# Patient Record
Sex: Male | Born: 1950
Health system: Southern US, Community
[De-identification: ages and names within clinical notes are randomized; demographics above are authoritative.]

## PROBLEM LIST (undated history)

## (undated) DIAGNOSIS — D649 Anemia, unspecified: Secondary | ICD-10-CM

## (undated) DIAGNOSIS — K219 Gastro-esophageal reflux disease without esophagitis: Secondary | ICD-10-CM

## (undated) HISTORY — DX: Anemia, unspecified: D64.9

## (undated) HISTORY — PX: STOMACH SURGERY: SHX791

## (undated) HISTORY — DX: Gastro-esophageal reflux disease without esophagitis: K21.9

## (undated) HISTORY — PX: LUMBAR FUSION: SHX111

---

## 1997-09-22 ENCOUNTER — Ambulatory Visit (HOSPITAL_COMMUNITY): Admission: RE | Admit: 1997-09-22 | Discharge: 1997-09-22 | Payer: Self-pay | Admitting: Neurosurgery

## 1998-10-29 ENCOUNTER — Emergency Department (HOSPITAL_COMMUNITY): Admission: EM | Admit: 1998-10-29 | Discharge: 1998-10-30 | Payer: Self-pay

## 1999-03-03 ENCOUNTER — Emergency Department (HOSPITAL_COMMUNITY): Admission: EM | Admit: 1999-03-03 | Discharge: 1999-03-04 | Payer: Self-pay | Admitting: *Deleted

## 2000-03-01 ENCOUNTER — Encounter: Payer: Self-pay | Admitting: Internal Medicine

## 2000-03-01 ENCOUNTER — Ambulatory Visit (HOSPITAL_COMMUNITY): Admission: RE | Admit: 2000-03-01 | Discharge: 2000-03-01 | Payer: Self-pay | Admitting: Internal Medicine

## 2002-03-27 ENCOUNTER — Emergency Department (HOSPITAL_COMMUNITY): Admission: EM | Admit: 2002-03-27 | Discharge: 2002-03-27 | Payer: Self-pay | Admitting: *Deleted

## 2002-03-27 ENCOUNTER — Encounter: Payer: Self-pay | Admitting: *Deleted

## 2004-07-31 ENCOUNTER — Emergency Department (HOSPITAL_COMMUNITY): Admission: EM | Admit: 2004-07-31 | Discharge: 2004-07-31 | Payer: Self-pay | Admitting: Emergency Medicine

## 2005-03-12 ENCOUNTER — Ambulatory Visit (HOSPITAL_COMMUNITY): Admission: RE | Admit: 2005-03-12 | Discharge: 2005-03-12 | Payer: Self-pay | Admitting: Internal Medicine

## 2005-06-17 ENCOUNTER — Ambulatory Visit: Payer: Self-pay | Admitting: Internal Medicine

## 2005-06-17 ENCOUNTER — Inpatient Hospital Stay (HOSPITAL_COMMUNITY): Admission: EM | Admit: 2005-06-17 | Discharge: 2005-06-19 | Payer: Self-pay | Admitting: Emergency Medicine

## 2005-06-18 ENCOUNTER — Encounter (INDEPENDENT_AMBULATORY_CARE_PROVIDER_SITE_OTHER): Payer: Self-pay | Admitting: Specialist

## 2008-10-02 ENCOUNTER — Emergency Department (HOSPITAL_COMMUNITY): Admission: EM | Admit: 2008-10-02 | Discharge: 2008-10-02 | Payer: Self-pay | Admitting: Emergency Medicine

## 2010-03-03 ENCOUNTER — Emergency Department (HOSPITAL_COMMUNITY)
Admission: EM | Admit: 2010-03-03 | Discharge: 2010-03-04 | Payer: Self-pay | Source: Home / Self Care | Admitting: Emergency Medicine

## 2010-06-15 LAB — POCT I-STAT, CHEM 8
BUN: 28 mg/dL — ABNORMAL HIGH (ref 6–23)
Calcium, Ion: 1.06 mmol/L — ABNORMAL LOW (ref 1.12–1.32)
Chloride: 111 mEq/L (ref 96–112)
Creatinine, Ser: 1.1 mg/dL (ref 0.4–1.5)
Glucose, Bld: 56 mg/dL — ABNORMAL LOW (ref 70–99)
HCT: 43 % (ref 39.0–52.0)
Hemoglobin: 14.6 g/dL (ref 13.0–17.0)
Potassium: 4.1 mEq/L (ref 3.5–5.1)
Sodium: 139 mEq/L (ref 135–145)
TCO2: 20 mmol/L (ref 0–100)

## 2010-08-20 NOTE — H&P (Signed)
NAMEJHAYDEN, Jorge Evans               ACCOUNT NO.:  [id number]   MEDICAL RECORD NO.:  [id number]          PATIENT TYPE:  INP   LOCATION:  4735                         FACILITY:  [facility]   PHYSICIAN:  Isidor Holts, M.D.  DATE OF BIRTH:  [birth year]   DATE OF ADMISSION:  06/17/2005  DATE OF DISCHARGE:                                HISTORY & PHYSICAL   PRIMARY CARE PHYSICIAN:  Lenon Curt. Chilton Si, M.D.   CHIEF COMPLAINT:  Passed out.   HISTORY OF PRESENT ILLNESS:  This is a 60 year old male.  For Past Medical  History, see below.  According to patient, he felt quite well until he went  to bed on June 16, 2005.  He woke up at 1:25 a.m. on June 17, 2005, felt  nauseated, took some Carafate and went back to sleep.  Woke up again at 3:00  a.m., went to the bathroom and moved his bowels, and the stools at that  time, seemed normal.  He took more Carafate for nausea, went back to bed.  Got up at 7:00 a.m., June 17, 2005, and went off to a job interview. While  there, sitting down, he started feeling warm, nauseated, tingly, and his  head started swimming.  He felt weak, got up, went out the door, and that  is all he remembers, until he awoke and found himself on the floor  surrounded by people.  EMS was called.  He had no acute injuries, no fecal  or urinary incontinence.   PAST MEDICAL HISTORY:  1.  Bleeding gastric ulcer August and September 1979, status post partial      gastrectomy.  2.  Scoliosis.  3.  Status post back surgery with fusion, for disk prolapse and      radiculopathy in 1999 (neurosurgeon Dr. Aliene Beams)   MEDICATIONS:  1.  Carafate 1 g p.o. 3 times a day.  2.  Skelaxin 800 mg p.o. twice daily  3.  NSAID 1 p.o. daily, for approximately 2 months, now. (The patient does      not know the name or the dose.)   ALLERGIES:  DEMEROL. This causes a rash.   REVIEW OF SYSTEMS:  Over the past few weeks, the patient has noticed  intermittent shortness of breath on exertion.  Denies chest pain.  Has also  recently been getting painful cramps in the calf.  No recent antecedent  illness, no vomiting, no black stools.   SOCIAL HISTORY:  The patient is married.  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  He has one daughter.  Smokes an occasional  cigar, does not drink any alcohol.  Denies drug abuse.   FAMILY HISTORY:  He has one brother who has coronary artery disease status  post CABG.  His mother is 68 years old with coronary artery disease and  hypertension.  His father died at age 9 years with emphysema.   PHYSICAL EXAMINATION:  VITAL SIGNS: Temperature 98.8, pulse 66 per minute  and regular, respiratory rate 18, blood pressure 80/41.  After bolus of  normal saline in  the emergency department was 111/49.  Pulse oximetry 100%  on 2 liters of oxygen.  GENERAL: The patient does not appear to be in obvious acute distress.  He is  alert, communicative, not short of breath at rest.  HEENT: The patient has marked clinical pallor, no jaundice.  Throat is quite  clear.  NECK:  Supple.  JVP not seen.  No palpable lymphadenopathy, no palpable  goiter.  CHEST: Clinically clear to auscultation, no wheezes or crackles.  CARDIAC:  Heart sounds 1 and 2 heard, normal, regular, no murmurs.  ABDOMEN:  Soft and nontender.  There is no palpable organomegaly, no  palpable masses.  Normal bowel sounds.  EXTREMITIES:  Lower extremity examination: No pitting edema, palpable  peripheral pulses.  MUSCULOSKELETAL: Examination was quite  on gross examination.   INVESTIGATIONS:  CBC: WBC 4, hemoglobin 4.8, hematocrit 16.7, MCV 54.5,  platelets 230.  Electrolytes: Sodium 137, potassium 4.3, chloride 114, CO2  21, BUN 13, creatinine 0.9, glucose 104. AST 20, ALT 20, alkaline  phosphatase 76.  INR 1.1.   ASSESSMENT AND PLAN:  1.  Profound microcytic anemia. Likely secondary to chronic gastrointestinal      blood loss.  We shall do iron studies, B12, folate,  stool guaiac's, and      transfuse 3 units packed red blood cells initially.  The patient's      anemia may, of course, be simple chronic iron-deficiency anemia,      secondary to gastrectomy. However, the patient has a history of      nonsteroidal anti-inflammatory drug (NSAID) treatment; i.e., for the      past 2 months.  We will need to rule out NSAID-induced peptic ulcer      disease, versus erosions. We shall commence patient on proton pump      inhibitor twice daily and request gastrointestinal consultation for      appropriate workup.   1.  Syncopal episode.  This is likely secondary to #1 above.  We shall,      however, do cardiac enzymes for completeness, EKG, and telemetric      monitoring.   1.  History of back pain.  The patient was on Skelaxin.  We shall hold this      for now, as it may cause/ exacerbate dizziness, and utilize p.r.n.      simple analgesics.   Further management will depend on clinical course.      Isidor Holts, M.D.  Electronically Signed     CO/MEDQ  D:  06/17/2005  T:  06/19/2005  Job:  [id number]   cc:   Lenon Curt. Chilton Si, M.D.  Fax: [phone]   Reinaldo Meeker, M.D.  Fax: [phone]

## 2010-08-20 NOTE — Discharge Summary (Signed)
Jorge Evans, Jorge Evans               ACCOUNT NO.:  [id number]   MEDICAL RECORD NO.:  [id number]          PATIENT TYPE:  INP   LOCATION:  4735                         FACILITY:  [facility]   PHYSICIAN:  Hettie Holstein, D.O.    DATE OF BIRTH:  01/29/1951   DATE OF ADMISSION:  06/17/2005  DATE OF DISCHARGE:  06/19/2005                                 DISCHARGE SUMMARY   PRIMARY CARE PHYSICIAN:  Dr. Murray Hodgkins   GASTROENTEROLOGIST:  Dr. Lina Sar   NEUROSURGEON:  Dr. Gerlene Fee   REASON FOR ADMISSION:  Profound anemia.   PRINCIPAL DIAGNOSES ON DISCHARGE:  1.  Iron deficiency anemia felt to be secondary to malabsorption. Jorge Evans      is status post a Billroth II surgery. His MCV was 65, his ferritin was      1, and his iron percent saturation was at 21. He underwent upper and      lower endoscopies per Dr. Juanda Chance without evidence of bleed or source of      anemia and is currently undergoing IV infusion of iron prior to      discharge.  2.  Anemia with presenting hemoglobin of 4.8 and an MCV as described above      at 65 status post 3 units of packed red blood cell transfusion with      stabilization of hemoglobin at 7.8 prior to discharge. He denied any      blood in his stools and, as noted above, he has undergone upper and      lower endoscopies without evidence of bleed.   PAST MEDICAL HISTORY:  As noted above, he has a previous history of partial  gastrectomy secondary to GI bleed from ulcers in 1979. He has a spinal  degenerative disease with severe back pain and use of nonsteroidal  antiinflammatories per Dr. Gerlene Fee. History of scoliosis, history of disc  fusions.   MEDICATIONS ON DISCHARGE:  He is being discharged on:  1.  Iron supplementation, ferrous gluconate 325 mg b.i.d.  2.  Vitamin C 500 mg daily.  3.  Prilosec over-the-counter 20 mg daily.  4.  He is instructed to use his nonsteroidal antiinflammatory sparingly.  5.  I am also writing a prescription for Ultram to take  to help manage his      back pain.   HISTORY OF PRESENTING ILLNESS:  For full details please refer to the history  and physical as dictated by Dr. Isidor Holts. However, briefly, Jorge Evans  is a pleasant 60 year old male with a history as described above status post  partial gastrectomy for ulcers who had presented with lightheadedness. He  had on day of presentation complained of nausea in the early morning. There  were no reports of blood in his stools. He was headed off for a job  interview, started feeling unwell, and subsequently the ambulance was  called. He presented to the emergency department and was discovered to have  a hemoglobin of 4.8. He was admitted for transfusion and GI evaluation.   HOSPITAL COURSE:  The patient was seen by [facility]  and was  admitted to a telemetry floor, underwent 3 units of prbc's transfusion.  There was no active bleeding during his hospital course. His hemoglobin  remained at 7.8 once transfused. His iron studies were indicative of iron  deficiency anemia and it was felt that this was related to malabsorption. He  is getting an infusion of iron and will be supplemented in the outpatient  setting. Currently recommending follow-up with his primary care physician  within the next 2 weeks for follow-up CBC. His hemoglobin at time of  discharge was 7.8, MCV was 65, ferritin of 1. He underwent upper and lower  endoscopy by Dr. Lina Sar, biopsies were taken and there are not results  at this time. He does have gastric remnant with normal mucosa and open  anastomosis status post a Billroth II procedure. He is medically stable for  discharge once he completes his iron infusion.   I did speak with Dr. Lina Sar as Dr. Gerlene Fee had stopped by and queried  whether or not he can continue his antiinflammatory agents. I spoke with Dr.  Juanda Chance. There was no evidence of gastritis, his gastric mucosa was normal,  and she recommended use of  nonsteroidals, though sparingly, this would be  okay, with proton pump inhibitor over-the-counter suggested.      Hettie Holstein, D.O.  Electronically Signed     ESS/MEDQ  D:  06/19/2005  T:  06/20/2005  Job:  [id number]   cc:   Lenon Curt. Chilton Si, M.D.  Fax: [phone]   Lina Sar, M.D. [facility]  [address]  [place]  [place] [address]   Reinaldo Meeker, M.D.  Fax: [phone]

## 2011-12-11 ENCOUNTER — Encounter (HOSPITAL_COMMUNITY): Payer: Self-pay | Admitting: *Deleted

## 2011-12-11 ENCOUNTER — Emergency Department (HOSPITAL_COMMUNITY)
Admission: EM | Admit: 2011-12-11 | Discharge: 2011-12-11 | Disposition: A | Discharge status: Home / Self Care | Payer: BC Managed Care – PPO | Attending: Emergency Medicine | Admitting: Emergency Medicine

## 2011-12-11 DIAGNOSIS — R52 Pain, unspecified: Secondary | ICD-10-CM | POA: Insufficient documentation

## 2011-12-11 DIAGNOSIS — R509 Fever, unspecified: Secondary | ICD-10-CM | POA: Insufficient documentation

## 2011-12-11 DIAGNOSIS — R110 Nausea: Secondary | ICD-10-CM | POA: Insufficient documentation

## 2011-12-11 DIAGNOSIS — R51 Headache: Secondary | ICD-10-CM | POA: Insufficient documentation

## 2011-12-11 DIAGNOSIS — D696 Thrombocytopenia, unspecified: Secondary | ICD-10-CM | POA: Insufficient documentation

## 2011-12-11 DIAGNOSIS — Z79899 Other long term (current) drug therapy: Secondary | ICD-10-CM | POA: Insufficient documentation

## 2011-12-11 LAB — CBC WITH DIFFERENTIAL/PLATELET
Basophils Absolute: 0 10*3/uL (ref 0.0–0.1)
Basophils Relative: 0 % (ref 0–1)
Eosinophils Absolute: 0 10*3/uL (ref 0.0–0.7)
Eosinophils Relative: 0 % (ref 0–5)
HCT: 44.9 % (ref 39.0–52.0)
Hemoglobin: 15.3 g/dL (ref 13.0–17.0)
Lymphocytes Relative: 7 % — ABNORMAL LOW (ref 12–46)
Lymphs Abs: 0.3 10*3/uL — ABNORMAL LOW (ref 0.7–4.0)
MCH: 28.2 pg (ref 26.0–34.0)
MCHC: 34.1 g/dL (ref 30.0–36.0)
MCV: 82.8 fL (ref 78.0–100.0)
Monocytes Absolute: 0.3 10*3/uL (ref 0.1–1.0)
Monocytes Relative: 7 % (ref 3–12)
Neutro Abs: 4.5 10*3/uL (ref 1.7–7.7)
Neutrophils Relative %: 87 % — ABNORMAL HIGH (ref 43–77)
Platelets: 146 10*3/uL — ABNORMAL LOW (ref 150–400)
RBC: 5.42 MIL/uL (ref 4.22–5.81)
RDW: 14.1 % (ref 11.5–15.5)
WBC: 5.2 10*3/uL (ref 4.0–10.5)

## 2011-12-11 LAB — URINALYSIS, ROUTINE W REFLEX MICROSCOPIC
Glucose, UA: NEGATIVE mg/dL
Ketones, ur: 15 mg/dL — AB
Leukocytes, UA: NEGATIVE
pH: 6.5 (ref 5.0–8.0)

## 2011-12-11 LAB — HEPATIC FUNCTION PANEL
Albumin: 3.8 g/dL (ref 3.5–5.2)
Alkaline Phosphatase: 93 U/L (ref 39–117)
Total Protein: 7 g/dL (ref 6.0–8.3)

## 2011-12-11 LAB — POCT I-STAT, CHEM 8
BUN: 9 mg/dL (ref 6–23)
Sodium: 137 mEq/L (ref 135–145)
TCO2: 21 mmol/L (ref 0–100)

## 2011-12-11 LAB — URINE MICROSCOPIC-ADD ON

## 2011-12-11 MED ORDER — ONDANSETRON 4 MG PO TBDP: 4.0000 mg | ORAL_TABLET | Freq: Three times a day (TID) | ORAL | Status: AC | PRN

## 2011-12-11 MED ORDER — DOXYCYCLINE HYCLATE 100 MG PO CAPS: 100.0000 mg | ORAL_CAPSULE | Freq: Two times a day (BID) | ORAL | Status: AC

## 2011-12-11 MED ORDER — ONDANSETRON HCL 4 MG/2ML IJ SOLN
4.0000 mg | Freq: Once | INTRAMUSCULAR | Status: AC
  Administered 2011-12-11: 4 mg via INTRAVENOUS
  Filled 2011-12-11: qty 2

## 2011-12-11 MED ORDER — DOXYCYCLINE HYCLATE 100 MG IV SOLR
100.0000 mg | Freq: Once | INTRAVENOUS | Status: AC
  Administered 2011-12-11: 100 mg via INTRAVENOUS
  Filled 2011-12-11: qty 100

## 2011-12-11 NOTE — ED Notes (Signed)
Patient is awaiting ABT infusion prior to discharge. No acute distress.

## 2011-12-11 NOTE — ED Notes (Addendum)
General sickness, n/v, h/a, fever. Hx. Of gi ulcers. Did take phenergan which help but still has fever. N/v/ h/a since 2230 last night. When standing becomes nauseated. Feel better when laying down.

## 2011-12-11 NOTE — ED Notes (Signed)
Vital signs stable. 

## 2011-12-11 NOTE — ED Notes (Signed)
Patient was alert and oriented x4.  Patient was explained discharge instructions by Meredith Staggers, RN and no questions were asked.  Patient was taken home by spouse.

## 2011-12-11 NOTE — ED Provider Notes (Signed)
History     CSN: [id number]  Arrival date & time 12/11/11  1604   First MD Initiated Contact with Patient 12/11/11 1727      Chief Complaint  Patient presents with  . Generalized Body Aches  . Nausea  . Headache     Patient is a 61 y.o. male presenting with headaches.  Headache   General sickness, n/v, h/a, fever. Hx. Of gi ulcers. Did take phenergan which help but still has fever. N/v/ h/a since 2230 last night. When standing becomes nauseated. Feel better when laying down.   History reviewed. No pertinent past medical history.  Past Surgical History  Procedure Date  . Stomach surgery   . Lumbar fusion     No family history on file.  History  Substance Use Topics  . Smoking status: Former Games developer  . Smokeless tobacco: Not on file  . Alcohol Use: No      Review of Systems  Neurological: Positive for headaches.  All other systems reviewed and are negative.    Allergies  Demerol  Home Medications   Current Outpatient Rx  Name Route Sig Dispense Refill  . DIPHENHYDRAMINE-APAP (SLEEP) 25-500 MG PO TABS Oral Take 1 tablet by mouth at bedtime as needed. For sleep/leg pain.    . SUCRALFATE 1 G PO TABS Oral Take 1 g by mouth 3 (three) times daily.    Marland Kitchen DOXYCYCLINE HYCLATE 100 MG PO CAPS Oral Take 1 capsule (100 mg total) by mouth 2 (two) times daily. 20 capsule 0  . ONDANSETRON 4 MG PO TBDP Oral Take 1 tablet (4 mg total) by mouth every 8 (eight) hours as needed for nausea. 20 tablet 0    BP 116/71  Pulse 74  Temp 97.7 F (36.5 C) (Oral)  Resp 17  SpO2 99%  Physical Exam  Nursing note and vitals reviewed. Constitutional: He is oriented to person, place, and time. He appears well-developed. No distress.  HENT:  Head: Normocephalic and atraumatic.  Eyes: Pupils are equal, round, and reactive to light.  Neck: Normal range of motion. Neck supple. No Brudzinski's sign and no Kernig's sign noted.  Cardiovascular: Normal rate and intact distal pulses.    Date: 12/11/2011  Rate: 73  Rhythm: normal sinus rhythm  QRS Axis: normal  Intervals: normal  ST/T Wave abnormalities: normal  Conduction Disutrbances: none  Narrative Interpretation: unremarkable      Pulmonary/Chest: No respiratory distress.  Abdominal: Normal appearance. He exhibits no distension. There is no tenderness. There is no rebound and no guarding.  Musculoskeletal: Normal range of motion.  Neurological: He is alert and oriented to person, place, and time. No cranial nerve deficit.  Skin: Skin is warm and dry. No rash noted.  Psychiatric: He has a normal mood and affect. His behavior is normal.    ED Course  Procedures (including critical care time) Scheduled Meds:    . doxycycline (VIBRAMYCIN) IV  100 mg Intravenous Once  . ondansetron  4 mg Intravenous Once   Continuous Infusions:  PRN Meds:. Scheduled Meds:   . doxycycline (VIBRAMYCIN) IV  100 mg Intravenous Once  . ondansetron  4 mg Intravenous Once   Continuous Infusions:  PRN Meds:.  Labs Reviewed  CBC WITH DIFFERENTIAL - Abnormal; Notable for the following:    Platelets 146 (*)     Neutrophils Relative 87 (*)     Lymphocytes Relative 7 (*)     Lymphs Abs 0.3 (*)     All other components within normal  limits  POCT I-STAT, CHEM 8 - Abnormal; Notable for the following:    Glucose, Bld 117 (*)     All other components within normal limits  URINALYSIS, ROUTINE W REFLEX MICROSCOPIC - Abnormal; Notable for the following:    Ketones, ur 15 (*)     Protein, ur 30 (*)     All other components within normal limits  HEPATIC FUNCTION PANEL  URINE MICROSCOPIC-ADD ON   No results found.   1. Fever   2. Thrombocytopenia       MDM   After treatment in the ED the patient feels back to baseline and wants to go home.   Patient is concerning for possible rickettsial illness in light of fever, headache, thrombocytopenia.  No known tick bite.  Will treat empirically.        Nelia Shi,  MD 12/11/11 [phone]

## 2011-12-11 NOTE — ED Notes (Signed)
Patient continues to receive IV abt infusion. No acute distress noted at present.

## 2014-04-16 ENCOUNTER — Other Ambulatory Visit: Payer: Self-pay | Admitting: Orthopaedic Surgery

## 2014-04-16 DIAGNOSIS — M40209 Unspecified kyphosis, site unspecified: Secondary | ICD-10-CM

## 2014-04-16 DIAGNOSIS — M419 Scoliosis, unspecified: Secondary | ICD-10-CM

## 2014-04-22 ENCOUNTER — Ambulatory Visit
Admission: RE | Admit: 2014-04-22 | Discharge: 2014-04-22 | Disposition: A | Discharge status: Home / Self Care | Payer: BLUE CROSS/BLUE SHIELD | Source: Ambulatory Visit | Attending: Orthopaedic Surgery | Admitting: Orthopaedic Surgery

## 2014-04-22 DIAGNOSIS — M419 Scoliosis, unspecified: Secondary | ICD-10-CM

## 2014-04-22 DIAGNOSIS — M40209 Unspecified kyphosis, site unspecified: Secondary | ICD-10-CM

## 2014-07-22 ENCOUNTER — Ambulatory Visit: Payer: BLUE CROSS/BLUE SHIELD | Admitting: Internal Medicine

## 2014-12-01 ENCOUNTER — Encounter: Payer: Self-pay | Admitting: Internal Medicine

## 2014-12-01 ENCOUNTER — Other Ambulatory Visit (INDEPENDENT_AMBULATORY_CARE_PROVIDER_SITE_OTHER): Payer: Medicare Other

## 2014-12-01 ENCOUNTER — Ambulatory Visit (INDEPENDENT_AMBULATORY_CARE_PROVIDER_SITE_OTHER): Payer: Medicare Other | Admitting: Internal Medicine

## 2014-12-01 VITALS — BP 118/70 | HR 88 | Temp 98.4°F | Resp 18 | Wt 155.1 lb

## 2014-12-01 DIAGNOSIS — R7301 Impaired fasting glucose: Secondary | ICD-10-CM

## 2014-12-01 DIAGNOSIS — D649 Anemia, unspecified: Secondary | ICD-10-CM

## 2014-12-01 DIAGNOSIS — M4120 Other idiopathic scoliosis, site unspecified: Secondary | ICD-10-CM | POA: Diagnosis not present

## 2014-12-01 DIAGNOSIS — E789 Disorder of lipoprotein metabolism, unspecified: Secondary | ICD-10-CM

## 2014-12-01 DIAGNOSIS — Z0000 Encounter for general adult medical examination without abnormal findings: Secondary | ICD-10-CM

## 2014-12-01 DIAGNOSIS — K219 Gastro-esophageal reflux disease without esophagitis: Secondary | ICD-10-CM | POA: Diagnosis not present

## 2014-12-01 LAB — CBC
HCT: 31.8 % — ABNORMAL LOW (ref 39.0–52.0)
HEMOGLOBIN: 9.9 g/dL — AB (ref 13.0–17.0)
MCHC: 31.1 g/dL (ref 30.0–36.0)
MCV: 62.9 fl — ABNORMAL LOW (ref 78.0–100.0)
PLATELETS: 204 10*3/uL (ref 150.0–400.0)
RBC: 5.06 Mil/uL (ref 4.22–5.81)
RDW: 19.5 % — AB (ref 11.5–15.5)
WBC: 6.2 10*3/uL (ref 4.0–10.5)

## 2014-12-01 LAB — HEMOGLOBIN A1C: HEMOGLOBIN A1C: 5.7 % (ref 4.6–6.5)

## 2014-12-01 LAB — COMPREHENSIVE METABOLIC PANEL
ALBUMIN: 4.2 g/dL (ref 3.5–5.2)
ALT: 13 U/L (ref 0–53)
AST: 14 U/L (ref 0–37)
Alkaline Phosphatase: 63 U/L (ref 39–117)
BUN: 14 mg/dL (ref 6–23)
CALCIUM: 9.1 mg/dL (ref 8.4–10.5)
CHLORIDE: 108 meq/L (ref 96–112)
CO2: 26 mEq/L (ref 19–32)
Creatinine, Ser: 0.95 mg/dL (ref 0.40–1.50)
GFR: 84.54 mL/min (ref >60.00)
Glucose, Bld: 95 mg/dL (ref 70–99)
POTASSIUM: 4.2 meq/L (ref 3.5–5.1)
Sodium: 140 mEq/L (ref 135–145)
Total Bilirubin: 0.4 mg/dL (ref 0.2–1.2)
Total Protein: 6.6 g/dL (ref 6.0–8.3)

## 2014-12-01 LAB — LIPID PANEL
CHOL/HDL RATIO: 4
Cholesterol: 140 mg/dL (ref 0–200)
HDL: 38.7 mg/dL — AB (ref >39.00)
LDL Cholesterol: 81 mg/dL (ref 0–99)
NONHDL: 101.5
TRIGLYCERIDES: 101 mg/dL (ref 0.0–149.0)
VLDL: 20.2 mg/dL (ref 0.0–40.0)

## 2014-12-01 NOTE — Assessment & Plan Note (Signed)
Uses gabapentin prn for severe pain. Uses chiropractor for maintenance pain relief. Talked to him about stretching exercises.

## 2014-12-01 NOTE — Progress Notes (Signed)
   Subjective:    Patient ID: Jorge Evans, male    DOB: 06-25-1950, 64 y.o.   MRN: [id number]  HPI The patient is a 64 YO man coming in new for his GERD. He previously had multiple bleeding ulcers and stomach surgery many years ago. Since that time he has been on sucralfate for the stomach which has been effective. No stomach pain and rare nausea and vomiting. No dark stools or blood in stools. No weight loss or fevers or chills.   PMH, [facility], social history reviewed and updated.   Review of Systems  Constitutional: Negative for fever, activity change, appetite change, fatigue and unexpected weight change.  HENT: Negative.   Eyes: Negative.   Respiratory: Negative for cough, chest tightness, shortness of breath and wheezing.   Cardiovascular: Negative for chest pain, palpitations and leg swelling.  Gastrointestinal: Negative for nausea, abdominal pain, diarrhea, constipation and abdominal distention.  Musculoskeletal: Negative for joint swelling, arthralgias, gait problem and neck stiffness.  Skin: Negative.   Neurological: Negative for dizziness, weakness, light-headedness and numbness.  Psychiatric/Behavioral: Negative.       Objective:   Physical Exam  Constitutional: He is oriented to person, place, and time. He appears well-developed and well-nourished.  Overweight  HENT:  Head: Normocephalic and atraumatic.  Eyes: EOM are normal.  Neck: Normal range of motion.  Cardiovascular: Normal rate and regular rhythm.   No murmur heard. Carotids bilaterally without murmur  Pulmonary/Chest: Effort normal and breath sounds normal. No respiratory distress. He has no wheezes. He has no rales. He exhibits no tenderness.  Abdominal: Soft. Bowel sounds are normal. He exhibits no distension. There is no tenderness. There is no rebound.  Musculoskeletal: He exhibits no edema.  Neurological: He is alert and oriented to person, place, and time. Coordination normal.  Skin: Skin is warm and dry.   Psychiatric: He has a normal mood and affect.   Filed Vitals:   12/01/14 0948  BP: 118/70  Pulse: 88  Temp: 98.4 F (36.9 C)  TempSrc: Oral  Resp: 18  Weight: 155 lb 1.9 oz (70.362 kg)  SpO2: 98%      Assessment & Plan:

## 2014-12-01 NOTE — Assessment & Plan Note (Signed)
Does use sucralfate for his GERD with good success, history of bleeding ulcers in the past. Will check CBC.

## 2014-12-01 NOTE — Progress Notes (Signed)
Pre visit review using our clinic review tool, if applicable. No additional management support is needed unless otherwise documented below in the visit note. 

## 2014-12-01 NOTE — Patient Instructions (Signed)
We will check your blood work today and call you back with the results.  Make sure to work on exercising for about 30 minutes a day 3-4 days per week to keep healthy.   Come back in 1 year if you are doing well and if you have any problems or questions please call us sooner.  Health Maintenance A healthy lifestyle and preventative care can promote health and wellness.  Maintain regular health, dental, and eye exams.  Eat a healthy diet. Foods like vegetables, fruits, whole grains, low-fat dairy products, and lean protein foods contain the nutrients you need and are low in calories. Decrease your intake of foods high in solid fats, added sugars, and salt. Get information about a proper diet from your health care provider, if necessary.  Regular physical exercise is one of the most important things you can do for your health. Most adults should get at least 150 minutes of moderate-intensity exercise (any activity that increases your heart rate and causes you to sweat) each week. In addition, most adults need muscle-strengthening exercises on 2 or more days a week.   Maintain a healthy weight. The body mass index (BMI) is a screening tool to identify possible weight problems. It provides an estimate of body fat based on height and weight. Your health care provider can find your BMI and can help you achieve or maintain a healthy weight. For males 20 years and older:  A BMI below 18.5 is considered underweight.  A BMI of 18.5 to 24.9 is normal.  A BMI of 25 to 29.9 is considered overweight.  A BMI of 30 and above is considered obese.  Maintain normal blood lipids and cholesterol by exercising and minimizing your intake of saturated fat. Eat a balanced diet with plenty of fruits and vegetables. Blood tests for lipids and cholesterol should begin at age 54 and be repeated every 5 years. If your lipid or cholesterol levels are high, you are over age 51, or you are at high risk for heart disease,  you may need your cholesterol levels checked more frequently.Ongoing high lipid and cholesterol levels should be treated with medicines if diet and exercise are not working.  If you smoke, find out from your health care provider how to quit. If you do not use tobacco, do not start.  Lung cancer screening is recommended for adults aged 55-80 years who are at high risk for developing lung cancer because of a history of smoking. A yearly low-dose CT scan of the lungs is recommended for people who have at least a 30-pack-year history of smoking and are current smokers or have quit within the past 15 years. A pack year of smoking is smoking an average of 1 pack of cigarettes a day for 1 year (for example, a 30-pack-year history of smoking could mean smoking 1 pack a day for 30 years or 2 packs a day for 15 years). Yearly screening should continue until the smoker has stopped smoking for at least 15 years. Yearly screening should be stopped for people who develop a health problem that would prevent them from having lung cancer treatment.  If you choose to drink alcohol, do not have more than 2 drinks per day. One drink is considered to be 12 oz (360 mL) of beer, 5 oz (150 mL) of wine, or 1.5 oz (45 mL) of liquor.  Avoid the use of street drugs. Do not share needles with anyone. Ask for help if you need support or instructions  about stopping the use of drugs.  High blood pressure causes heart disease and increases the risk of stroke. Blood pressure should be checked at least every 1-2 years. Ongoing high blood pressure should be treated with medicines if weight loss and exercise are not effective.  If you are 34-78 years old, ask your health care provider if you should take aspirin to prevent heart disease.  Diabetes screening involves taking a blood sample to check your fasting blood sugar level. This should be done once every 3 years after age 39 if you are at a normal weight and without risk factors for  diabetes. Testing should be considered at a younger age or be carried out more frequently if you are overweight and have at least 1 risk factor for diabetes.  Colorectal cancer can be detected and often prevented. Most routine colorectal cancer screening begins at the age of 72 and continues through age 62. However, your health care provider may recommend screening at an earlier age if you have risk factors for colon cancer. On a yearly basis, your health care provider may provide home test kits to check for hidden blood in the stool. A small camera at the end of a tube may be used to directly examine the colon (sigmoidoscopy or colonoscopy) to detect the earliest forms of colorectal cancer. Talk to your health care provider about this at age 56 when routine screening begins. A direct exam of the colon should be repeated every 5-10 years through age 66, unless early forms of precancerous polyps or small growths are found.  People who are at an increased risk for hepatitis B should be screened for this virus. You are considered at high risk for hepatitis B if:  You were born in a country where hepatitis B occurs often. Talk with your health care provider about which countries are considered high risk.  Your parents were born in a high-risk country and you have not received a shot to protect against hepatitis B (hepatitis B vaccine).  You have HIV or AIDS.  You use needles to inject street drugs.  You live with, or have sex with, someone who has hepatitis B.  You are a man who has sex with other men (MSM).  You get hemodialysis treatment.  You take certain medicines for conditions like cancer, organ transplantation, and autoimmune conditions.  Hepatitis C blood testing is recommended for all people born from [birth year] through 1966 and any individual with known risk factors for hepatitis C.  Healthy men should no longer receive prostate-specific antigen (PSA) blood tests as part of routine cancer  screening. Talk to your health care provider about prostate cancer screening.  Testicular cancer screening is not recommended for adolescents or adult males who have no symptoms. Screening includes self-exam, a health care provider exam, and other screening tests. Consult with your health care provider about any symptoms you have or any concerns you have about testicular cancer.  Practice safe sex. Use condoms and avoid high-risk sexual practices to reduce the spread of sexually transmitted infections (STIs).  You should be screened for STIs, including gonorrhea and chlamydia if:  You are sexually active and are younger than 24 years.  You are older than 24 years, and your health care provider tells you that you are at risk for this type of infection.  Your sexual activity has changed since you were last screened, and you are at an increased risk for chlamydia or gonorrhea. Ask your health care provider if you  are at risk.  If you are at risk of being infected with HIV, it is recommended that you take a prescription medicine daily to prevent HIV infection. This is called pre-exposure prophylaxis (PrEP). You are considered at risk if:  You are a man who has sex with other men (MSM).  You are a heterosexual man who is sexually active with multiple partners.  You take drugs by injection.  You are sexually active with a partner who has HIV.  Talk with your health care provider about whether you are at high risk of being infected with HIV. If you choose to begin PrEP, you should first be tested for HIV. You should then be tested every 3 months for as long as you are taking PrEP.  Use sunscreen. Apply sunscreen liberally and repeatedly throughout the day. You should seek shade when your shadow is shorter than you. Protect yourself by wearing long sleeves, pants, a wide-brimmed hat, and sunglasses year round whenever you are outdoors.  Tell your health care provider of new moles or changes in  moles, especially if there is a change in shape or color. Also, tell your health care provider if a mole is larger than the size of a pencil eraser.  A one-time screening for abdominal aortic aneurysm (AAA) and surgical repair of large AAAs by ultrasound is recommended for men aged 55-75 years who are current or former smokers.  Stay current with your vaccines (immunizations). Document Released: 09/17/2007 Document Revised: 03/26/2013 Document Reviewed: 08/16/2010 [facility] Patient Information 2015 [place], [place]. This information is not intended to replace advice given to you by your health care provider. Make sure you discuss any questions you have with your health care provider.

## 2014-12-02 LAB — HEPATITIS C ANTIBODY: HCV AB: NEGATIVE

## 2014-12-04 ENCOUNTER — Telehealth: Payer: Self-pay | Admitting: *Deleted

## 2014-12-04 MED ORDER — FERROUS SULFATE 324 (65 FE) MG PO TBEC: 1.0000 | DELAYED_RELEASE_TABLET | Freq: Two times a day (BID) | ORAL | Status: DC

## 2014-12-04 NOTE — Telephone Encounter (Signed)
Notified pt wife with md response.../lmb 

## 2014-12-04 NOTE — Telephone Encounter (Signed)
Have sent in rx but can also buy OTC and take 1 pill twice daily.

## 2014-12-04 NOTE — Telephone Encounter (Signed)
Left msg on triage stating md wanted husband to start taking iron supplements. Wanting to know how much iron he need to take daily...Raechel Chute

## 2014-12-17 ENCOUNTER — Telehealth: Payer: Self-pay | Admitting: Internal Medicine

## 2014-12-17 NOTE — Telephone Encounter (Signed)
Received records from [address] [facility] forwarded 98 pages to Dr. Genella Mech 12/17/14 fbg.

## 2015-03-16 DIAGNOSIS — H2513 Age-related nuclear cataract, bilateral: Secondary | ICD-10-CM | POA: Diagnosis not present

## 2015-03-16 DIAGNOSIS — H25013 Cortical age-related cataract, bilateral: Secondary | ICD-10-CM | POA: Diagnosis not present

## 2015-06-14 DIAGNOSIS — R112 Nausea with vomiting, unspecified: Secondary | ICD-10-CM | POA: Diagnosis not present

## 2015-07-17 ENCOUNTER — Encounter (HOSPITAL_COMMUNITY): Payer: Self-pay

## 2015-07-17 ENCOUNTER — Ambulatory Visit (HOSPITAL_COMMUNITY)
Admission: EM | Admit: 2015-07-17 | Discharge: 2015-07-17 | Disposition: A | Discharge status: Home / Self Care | Payer: Medicare Other | Attending: Emergency Medicine | Admitting: Emergency Medicine

## 2015-07-17 DIAGNOSIS — R110 Nausea: Secondary | ICD-10-CM | POA: Diagnosis not present

## 2015-07-17 LAB — POCT I-STAT, CHEM 8
BUN: 12 mg/dL (ref 6–20)
CALCIUM ION: 1.17 mmol/L (ref 1.13–1.30)
CHLORIDE: 105 mmol/L (ref 101–111)
CREATININE: 0.8 mg/dL (ref 0.61–1.24)
GLUCOSE: 87 mg/dL (ref 65–99)
HCT: 43 % (ref 39.0–52.0)
HEMOGLOBIN: 14.6 g/dL (ref 13.0–17.0)
Potassium: 4.5 mmol/L (ref 3.5–5.1)
Sodium: 140 mmol/L (ref 135–145)
TCO2: 24 mmol/L (ref 0–100)

## 2015-07-17 MED ORDER — ONDANSETRON HCL 4 MG PO TABS: 4.0000 mg | ORAL_TABLET | Freq: Three times a day (TID) | ORAL | Status: DC | PRN

## 2015-07-17 NOTE — Discharge Instructions (Signed)
I suspect you have a stomach bug. Your blood work and vital signs are good. Take the Zofran every 8 hours for the next 2 days, then as needed for nausea. I would like for you to increase your food and fluid intake. If your dizziness is getting worse or you actually pass out, please go to the emergency room. Otherwise, follow-up with your regular doctor as scheduled next week.

## 2015-07-17 NOTE — ED Notes (Signed)
Patient discharged by Erin Honig, MD 

## 2015-07-17 NOTE — ED Notes (Signed)
Patient is here for nausea and flu-like symptoms accompanied by vomiting x1 week, he has treated symptoms with OTC medications. No acute distress Wife at bedside

## 2015-07-17 NOTE — ED Provider Notes (Addendum)
CSN: [id number]     Arrival date & time 07/17/15  1642 History   First MD Initiated Contact with Patient 07/17/15 1856     Chief Complaint  Patient presents with  . Nausea  . Influenza   (Consider location/radiation/quality/duration/timing/severity/associated sxs/prior Treatment) HPI He is a 65 year old man here with his wife for evaluation of nausea. He states for the last week he has been nauseous. He reports 2-3 episodes of emesis. No diarrhea. No abdominal pain. He states about a week and a half ago he was constipated, but this resolved with over-the-counter suppositories and enemas. He reports regular bowel movements at this time. He also states that he does okay when he is sitting or laying down. When he stands up he will get lightheaded, weak, and nauseated for several minutes. This will resolve, but recur whenever he stands up or tried to move around. He denies any syncopal episodes. He has had some intermittent cough and runny nose, but nothing consistent. No known fevers.  He has had decreased by mouth intake due to the nausea.  Past Medical History  Diagnosis Date  . GERD (gastroesophageal reflux disease)    Past Surgical History  Procedure Laterality Date  . Stomach surgery    . Lumbar fusion     Family History  Problem Relation Age of Onset  . Heart attack Mother   . Emphysema Father   . Cancer Father     lung   Social History  Substance Use Topics  . Smoking status: Former Games developermoker  . Smokeless tobacco: None  . Alcohol Use: No    Review of Systems As in history of present illness Allergies  Demerol  Home Medications   Prior to Admission medications   Medication Sig Start Date End Date Taking? Authorizing Provider  diphenhydramine-acetaminophen (TYLENOL PM) 25-500 MG TABS Take 1 tablet by mouth at bedtime as needed. For sleep/leg pain.   Yes Historical Provider, MD  sucralfate (CARAFATE) 1 G tablet Take 1 g by mouth 3 (three) times daily.   Yes Historical  Provider, MD  ferrous sulfate 324 (65 FE) MG TBEC Take 1 tablet (325 mg total) by mouth 2 (two) times daily. 12/04/14   Myrlene BrokerElizabeth A Crawford, MD  gabapentin (NEURONTIN) 300 MG capsule 1 CAP AT NIGHT FOR 3-4 DAYS, THEN INCREASE TO TWICE A DAY X3-4 DAYS THEN 3 TIMES A DAY 11/07/14   Historical Provider, MD  ondansetron (ZOFRAN) 4 MG tablet Take 1 tablet (4 mg total) by mouth every 8 (eight) hours as needed for nausea or vomiting. 07/17/15   Charm RingsErin J Mikhia Dusek, MD   Meds Ordered and Administered this Visit  Medications - No data to display  BP 124/65 mmHg  Pulse 62  Temp(Src) 98 F (36.7 C) (Oral)  SpO2 100% Orthostatic VS for the past 24 hrs:  BP- Lying Pulse- Lying BP- Sitting Pulse- Sitting BP- Standing at 0 minutes Pulse- Standing at 0 minutes  07/17/15 1934 130/72 mmHg 53 146/57 mmHg 65 135/69 mmHg 64    Physical Exam  Constitutional: He is oriented to person, place, and time. He appears well-developed and well-nourished. No distress.  Neck: Neck supple.  Cardiovascular: Normal rate, regular rhythm and normal heart sounds.   No murmur heard. Pulmonary/Chest: Effort normal and breath sounds normal. No respiratory distress. He has no wheezes. He has no rales.  Abdominal: Soft. Bowel sounds are normal. He exhibits no distension and no mass. There is no tenderness. There is no rebound and no guarding.  Neurological:  He is alert and oriented to person, place, and time.    ED Course  Procedures (including critical care time)  Labs Review Labs Reviewed  POCT I-STAT, CHEM 8    Imaging Review No results found.   MDM   1. Nausea    I-STAT and orthostatics are within normal limits. I suspect he has a mild stomach bug. Symptomatic treatment with Zofran. Recommended increased fluids and by mouth intake. Return precautions reviewed. He has an appointment with his PCP scheduled for Wednesday.  I have verbally reviewed the discharge instructions with the patient. A printed AVS was given to  the patient.  All questions were answered prior to discharge.    Charm Rings, MD 07/17/15 2022  Charm Rings, MD 07/17/15 2031

## 2015-07-22 ENCOUNTER — Ambulatory Visit (INDEPENDENT_AMBULATORY_CARE_PROVIDER_SITE_OTHER): Payer: Medicare Other | Admitting: Physician Assistant

## 2015-07-22 ENCOUNTER — Encounter: Payer: Self-pay | Admitting: Physician Assistant

## 2015-07-22 VITALS — BP 124/70 | HR 60 | Temp 98.0°F | Resp 18 | Ht 63.5 in | Wt 161.0 lb

## 2015-07-22 DIAGNOSIS — B86 Scabies: Secondary | ICD-10-CM | POA: Diagnosis not present

## 2015-07-22 DIAGNOSIS — Z7189 Other specified counseling: Secondary | ICD-10-CM | POA: Diagnosis not present

## 2015-07-22 DIAGNOSIS — Z7689 Persons encountering health services in other specified circumstances: Secondary | ICD-10-CM

## 2015-07-22 MED ORDER — PERMETHRIN 5 % EX CREA: 1.0000 "application " | TOPICAL_CREAM | Freq: Once | CUTANEOUS | Status: DC
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
## 2015-07-27 DIAGNOSIS — R55 Syncope and collapse: Secondary | ICD-10-CM | POA: Diagnosis not present

## 2015-07-27 DIAGNOSIS — R110 Nausea: Secondary | ICD-10-CM | POA: Diagnosis not present

## 2015-07-27 DIAGNOSIS — D500 Iron deficiency anemia secondary to blood loss (chronic): Secondary | ICD-10-CM | POA: Diagnosis not present

## 2015-07-27 DIAGNOSIS — Z86010 Personal history of colonic polyps: Secondary | ICD-10-CM | POA: Diagnosis not present

## 2015-07-28 ENCOUNTER — Telehealth: Payer: Self-pay | Admitting: Physician Assistant

## 2015-07-28 NOTE — Telephone Encounter (Signed)
LMTCB  What about Zofran already has order for??

## 2015-07-28 NOTE — Telephone Encounter (Signed)
pt is having severe nausea and his wife is requesting that we call in phenergan to hold him over until they can figure out what is going on. He saw a specialist yesterday and is scheduled to have a procedure next week to find the cause.  [facility] [place] [phone]

## 2015-07-31 NOTE — Telephone Encounter (Signed)
Had also called GI doctor and they have prescribed him something

## 2015-08-06 DIAGNOSIS — K2970 Gastritis, unspecified, without bleeding: Secondary | ICD-10-CM | POA: Diagnosis not present

## 2015-08-06 DIAGNOSIS — D509 Iron deficiency anemia, unspecified: Secondary | ICD-10-CM | POA: Diagnosis not present

## 2015-08-06 DIAGNOSIS — D500 Iron deficiency anemia secondary to blood loss (chronic): Secondary | ICD-10-CM | POA: Diagnosis not present

## 2015-08-06 LAB — HM COLONOSCOPY

## 2015-08-07 ENCOUNTER — Other Ambulatory Visit: Payer: Self-pay | Admitting: Gastroenterology

## 2015-08-07 DIAGNOSIS — R110 Nausea: Secondary | ICD-10-CM

## 2015-08-20 ENCOUNTER — Ambulatory Visit (HOSPITAL_COMMUNITY)
Admission: RE | Admit: 2015-08-20 | Discharge: 2015-08-20 | Disposition: A | Discharge status: Home / Self Care | Payer: Medicare Other | Source: Ambulatory Visit | Attending: Gastroenterology | Admitting: Gastroenterology

## 2015-08-20 DIAGNOSIS — R110 Nausea: Secondary | ICD-10-CM | POA: Diagnosis not present

## 2015-08-20 MED ORDER — TECHNETIUM TC 99M SULFUR COLLOID
2.2000 | Freq: Once | INTRAVENOUS | Status: AC | PRN
  Administered 2015-08-20: 2.2 via ORAL

## 2015-09-23 DIAGNOSIS — D500 Iron deficiency anemia secondary to blood loss (chronic): Secondary | ICD-10-CM | POA: Diagnosis not present

## 2015-09-23 DIAGNOSIS — R110 Nausea: Secondary | ICD-10-CM | POA: Diagnosis not present

## 2015-10-21 DIAGNOSIS — R110 Nausea: Secondary | ICD-10-CM | POA: Diagnosis not present

## 2015-10-21 DIAGNOSIS — R42 Dizziness and giddiness: Secondary | ICD-10-CM | POA: Diagnosis not present

## 2015-11-10 DIAGNOSIS — R42 Dizziness and giddiness: Secondary | ICD-10-CM | POA: Insufficient documentation

## 2015-11-11 ENCOUNTER — Other Ambulatory Visit: Payer: Self-pay | Admitting: Otolaryngology

## 2015-11-11 DIAGNOSIS — R42 Dizziness and giddiness: Secondary | ICD-10-CM

## 2015-11-12 ENCOUNTER — Ambulatory Visit
Admission: RE | Admit: 2015-11-12 | Discharge: 2015-11-12 | Disposition: A | Discharge status: Home / Self Care | Payer: Medicare Other | Source: Ambulatory Visit | Attending: Otolaryngology | Admitting: Otolaryngology

## 2015-11-12 DIAGNOSIS — R42 Dizziness and giddiness: Secondary | ICD-10-CM

## 2015-11-12 MED ORDER — GADOBENATE DIMEGLUMINE 529 MG/ML IV SOLN
15.0000 mL | Freq: Once | INTRAVENOUS | Status: AC | PRN
  Administered 2015-11-12: 15 mL via INTRAVENOUS

## 2015-12-01 ENCOUNTER — Encounter: Payer: Medicare Other | Admitting: Internal Medicine

## 2015-12-03 ENCOUNTER — Encounter: Payer: Self-pay | Admitting: Neurology

## 2015-12-03 ENCOUNTER — Ambulatory Visit (INDEPENDENT_AMBULATORY_CARE_PROVIDER_SITE_OTHER): Payer: Medicare Other | Admitting: Neurology

## 2015-12-03 VITALS — Ht 64.0 in | Wt 163.8 lb

## 2015-12-03 DIAGNOSIS — G458 Other transient cerebral ischemic attacks and related syndromes: Secondary | ICD-10-CM | POA: Diagnosis not present

## 2015-12-03 DIAGNOSIS — H93A9 Pulsatile tinnitus, unspecified ear: Secondary | ICD-10-CM

## 2015-12-03 DIAGNOSIS — R404 Transient alteration of awareness: Secondary | ICD-10-CM

## 2015-12-03 DIAGNOSIS — R55 Syncope and collapse: Secondary | ICD-10-CM

## 2015-12-03 DIAGNOSIS — R42 Dizziness and giddiness: Secondary | ICD-10-CM

## 2015-12-03 DIAGNOSIS — E860 Dehydration: Secondary | ICD-10-CM | POA: Diagnosis not present

## 2015-12-03 DIAGNOSIS — R110 Nausea: Secondary | ICD-10-CM

## 2015-12-03 NOTE — Patient Instructions (Signed)
Remember to drink plenty of fluid, eat healthy meals and do not skip any meals. Try to eat protein with a every meal and eat a healthy snack such as fruit or nuts in between meals. Try to keep a regular sleep-wake schedule and try to exercise daily, particularly in the form of walking, 20-30 minutes a day, if you can.   I would like to suggest; Need increased water intake.   As far as diagnostic testing: MRA of the head and neck  I would like to see you back as needed, sooner if we need to. Please call us with any interim questions, concerns, problems, updates or refill requests.   Our phone number is [phone]. We also have an after hours call service for urgent matters and there is a physician on-call for urgent questions. For any emergencies you know to call 911 or go to the nearest emergency room

## 2015-12-03 NOTE — Progress Notes (Signed)
[facility]    Provider:  Dr Lucia GaskinsAhern Referring Provider: Deon Evans, Jorge B, PA-C Primary Care Physician:  Jorge RichardsIXON,Jorge BETH, PA-C  CC:  Constant Nausea since 3/17  HPI:  Jorge PiRaymond N Kibler Jr. is a 65 y.o. male here as a referral from Dr. Durwin Noraixon for vertigo with associated nausea and vomiting. I am their "last stop and hope". He has been to 4 doctor, ENT, GI, other medical doctors and urgent care. Started 4 months ago without inciting event however he did fall and hit his head on the door but he does not think he hit it hard denies any postconcussive symptoms. He took some Magnesium for constipation and he felt weak. He felt dizzy, everything got white, felt like he was going to pass out, felt like he couldn't breathe he "went out" and hit his head on the door. A few weeks afterwards he would get queasy after eating. If he laid down he would be ok, ok when sitting. He would feel lightheaded when getiting up and only when he moved. + room spinning once but  just light-headed. He would have to grab onto something and had nausea. Emerol, and antinausea medication he buys the store, helps with the symptoms with the nausea. Some days he can feel great. Some days symptoms are continuous all day long. No headaches. He endorses general weakness and nausea. When he sits on the couch sometimes he gets "swimmy headed". Eating a cookie and diet coke helps. No dizziness on head movements side to side or back. No Hx of migraines or headaches. No constipation, just once this year, no erectile dysfunction, no family history vestibular disorders, no dry mouth, he does not drink any water daily he drinks maybe one glass of water a week. He drinks diet coke every day. His urine is dark yellow/brown. No other associated symptoms or focal neurologic complaints or deficits.  Reviewed notes, labs and imaging from outside physicians, which showed:  Patient presented to primary care with dizziness for 3 months, nausea  after getting up from a meal. More recently and doesn't follow a meal. He gets a weak feeling. He has gone GI evaluation for the nausea and upper and lower endoscopy were unremarkable. Every time he gets up he feels nauseated. He does not experience the room spinning but has a sense like it is going that way. Nausea leads to gagging. The feeling settles when he sits back down after about 10-15 minutes. The nausea and vertigo does not happen every time he gets up. Blood work has been normal. He feels fine if he sits or lies still. Rolling over does not aggravate the symptoms. He feels this symptom when he gets up. About 3-4 months ago, after taking a liquid laxative, he felt some vertigo and then passed out after using the bathroom. Hearing has not changed much in either ear but the left ear may be a bit decreased. There can be moisture in the left ear. He has noticed some tremor of the left hand when holding something. Patient is on gabapentin, promethazine, cholestyramine, Carafate. He was seen by Dr. Jenne [facility]ane[facility]ates ENT who diagnosed him with a vertigo sensation with associated nausea. Not consistent with common inner ear disease processes.  BUN 12 and creatinine 0.8 07/17/2015. hgba1c 5.7.   Personally reviewed images and agree with the following MRI of the brain:  IMPRESSION: 1. Negative MRI of the brain. No structural findings to explain patient's symptoms identified. 2. Mild cerebral white matter disease, nonspecific, but most  likely related to chronic small vessel ischemic changes. 3. Mild age-related cerebral atrophy. 4. Degenerative spondylolysis at C3-4 with resultant mild canal Stenosis.  Review of Systems: Patient complains of symptoms per HPI as well as the following symptoms:  Weakness, dizziness, passing out, decreased energy, cramps, ringing in ears, birthmarks, weight gain, fatigue. Pertinent negatives per HPI. All others negative.   Social History   Social History  . Marital status:  Married    Spouse name: Jorge Evans  . Number of children: 1  . Years of education: 68 +   Occupational History  . Retired    Social History Main Topics  . Smoking status: Former Games developer  . Smokeless tobacco: Never Used  . Alcohol use No  . Drug use: No  . Sexual activity: Not on file   Other Topics Concern  . Not on file   Social History Narrative   Lives w/ wife   Caffeine use: 2 cups coffee per week    Family History  Problem Relation Age of Onset  . Heart attack Mother   . Emphysema Father   . Cancer Father     lung    Past Medical History:  Diagnosis Date  . GERD (gastroesophageal reflux disease)     Past Surgical History:  Procedure Laterality Date  . LUMBAR FUSION    . STOMACH SURGERY      Current Outpatient Prescriptions  Medication Sig Dispense Refill  . gabapentin (NEURONTIN) 300 MG capsule 1 CAP AT NIGHT FOR 3-4 DAYS, THEN INCREASE TO TWICE A DAY X3-4 DAYS THEN 3 TIMES A DAY  2  . promethazine (PHENERGAN) 25 MG tablet Take 25 mg by mouth every 6 (six) hours.  3  . sucralfate (CARAFATE) 1 G tablet Take 1 g by mouth 3 (three) times daily.    . ferrous sulfate 324 (65 FE) MG TBEC Take 1 tablet (325 mg total) by mouth 2 (two) times daily. (Patient not taking: Reported on 12/03/2015) 60 tablet 11   No current facility-administered medications for this visit.     Allergies as of 12/03/2015 - Review Complete 12/03/2015  Allergen Reaction Noted  . Demerol [meperidine]  12/11/2011   No data found.  No data found.  With correction 20/30 right 20/70 left 20/30 both  Vitals: Ht 5\' 4"  (1.626 m)   Wt 163 lb 12.8 oz (74.3 kg)   BMI 28.12 kg/m  Last Weight:  Wt Readings from Last 1 Encounters:  12/03/15 163 lb 12.8 oz (74.3 kg)   Last Height:   Ht Readings from Last 1 Encounters:  12/03/15 5\' 4"  (1.626 m)    Physical exam: Exam: Gen: NAD, conversant, well nourised, well groomed                     CV: RRR, no MRG. No Carotid Bruits. No peripheral  edema, warm, nontender Eyes: Conjunctivae clear without exudates or hemorrhage  Neuro: Detailed Neurologic Exam  Speech:    Speech is normal; fluent and spontaneous with normal comprehension.  Cognition:    The patient is oriented to person, place, and time;     recent and remote memory intact;     language fluent;     normal attention, concentration,     fund of knowledge Cranial Nerves:    The pupils are equal, round, and reactive to light. The fundi are normal and spontaneous venous pulsations are present. Visual fields are full to finger confrontation. Extraocular movements are intact. Trigeminal sensation is  intact and the muscles of mastication are normal. The face is symmetric. The palate elevates in the midline. Hearing intact. Voice is normal. Shoulder shrug is normal. The tongue has normal motion without fasciculations.   Coordination:    Normal finger to nose and heel to shin. Normal rapid alternating movements.   Gait:    Heel-toe and tandem gait are normal. Mild left-hand tremor on walking.  Motor Observation:    No asymmetry, no atrophy, and no involuntary movements noted. Tone:    Normal muscle tone.    Posture:    Posture is normal. normal erect    Strength:    Strength is V/V in the upper and lower limbs.      Sensation: intact to LT     Reflex Exam:  DTR's:    Deep tendon reflexes in the upper and lower extremities are normal bilaterally.   Toes:    The toes are downgoing bilaterally.   Clonus:    Clonus is absent.      Assessment/Plan:  65 year old with nausea and dizziness mostly on standing. Drinks no water during the day only diet coke, maybe one glass of water a week at most.  He does not drink any water daily he drinks maybe one glass of water a week. He drinks diet coke every day. His urine is yellow/brown. He has been extensively evaluated by GI, ENT and primary care. Neurologic exam is essentially normal. Advised patient to increase his fluid  intake until his urine is light clear yellow. I have also asked him to keep a journal on his fluid intake daily. MRI of the brain did not show any etiology for his symptoms. He has no history or symptoms of migraine, headaches, strokes, central vestibular disorders or other neurologic conditions such as Parkinson's disease that could cause his symptoms. MRI of the brain does not show any etiology for his symptoms. I do think if his practically non-existent  intake of water (only soda) could be causing his symptoms despite negative orthostatic vital signs. I will continue to follow patients every 6 months given his history of tremor that I don't see on exam today except mildly when walking, will follow for progression. We'll also order MRI of the brain and the neck to evaluate for any vascular etiology of his orthostatic dizziness and nausea. If increasing fluid intake does not resolve his dark yellow and brown urine then he needs to follow-up with primary care and discuss it and have his urine evaluated.  Cc: Jorge Richards, PA-C    Naomie Dean, MD  [facility] [address] [place], [place] [address]  Phone [phone] Fax [phone]

## 2015-12-06 DIAGNOSIS — E860 Dehydration: Secondary | ICD-10-CM | POA: Insufficient documentation

## 2015-12-08 ENCOUNTER — Ambulatory Visit (INDEPENDENT_AMBULATORY_CARE_PROVIDER_SITE_OTHER): Payer: Medicare Other | Admitting: Family Medicine

## 2015-12-08 ENCOUNTER — Other Ambulatory Visit: Payer: Self-pay | Admitting: Family Medicine

## 2015-12-08 ENCOUNTER — Encounter: Payer: Self-pay | Admitting: Family Medicine

## 2015-12-08 VITALS — BP 132/70 | HR 82 | Temp 98.4°F | Resp 18 | Ht 64.0 in | Wt 164.0 lb

## 2015-12-08 DIAGNOSIS — R5383 Other fatigue: Secondary | ICD-10-CM | POA: Diagnosis not present

## 2015-12-08 DIAGNOSIS — K219 Gastro-esophageal reflux disease without esophagitis: Secondary | ICD-10-CM

## 2015-12-08 DIAGNOSIS — Z23 Encounter for immunization: Secondary | ICD-10-CM

## 2015-12-08 DIAGNOSIS — Z125 Encounter for screening for malignant neoplasm of prostate: Secondary | ICD-10-CM

## 2015-12-08 DIAGNOSIS — D649 Anemia, unspecified: Secondary | ICD-10-CM | POA: Diagnosis not present

## 2015-12-08 DIAGNOSIS — R7301 Impaired fasting glucose: Secondary | ICD-10-CM

## 2015-12-08 DIAGNOSIS — Z0000 Encounter for general adult medical examination without abnormal findings: Secondary | ICD-10-CM | POA: Diagnosis not present

## 2015-12-08 DIAGNOSIS — R110 Nausea: Secondary | ICD-10-CM

## 2015-12-08 DIAGNOSIS — Z13220 Encounter for screening for lipoid disorders: Secondary | ICD-10-CM

## 2015-12-08 DIAGNOSIS — Z79899 Other long term (current) drug therapy: Secondary | ICD-10-CM | POA: Diagnosis not present

## 2015-12-08 LAB — COMPREHENSIVE METABOLIC PANEL
ALBUMIN: 4.1 g/dL (ref 3.6–5.1)
ALT: 10 U/L (ref 9–46)
AST: 13 U/L (ref 10–35)
Alkaline Phosphatase: 80 U/L (ref 40–115)
BUN: 13 mg/dL (ref 7–25)
CHLORIDE: 109 mmol/L (ref 98–110)
CO2: 21 mmol/L (ref 20–31)
CREATININE: 0.88 mg/dL (ref 0.70–1.25)
Calcium: 8.8 mg/dL (ref 8.6–10.3)
Glucose, Bld: 92 mg/dL (ref 70–99)
POTASSIUM: 4.1 mmol/L (ref 3.5–5.3)
SODIUM: 139 mmol/L (ref 135–146)
Total Bilirubin: 0.4 mg/dL (ref 0.2–1.2)
Total Protein: 6.4 g/dL (ref 6.1–8.1)

## 2015-12-08 LAB — LIPID PANEL
CHOL/HDL RATIO: 4.2 ratio (ref <=5.0)
CHOLESTEROL: 155 mg/dL (ref 125–200)
HDL: 37 mg/dL — ABNORMAL LOW (ref >=40)
LDL CALC: 96 mg/dL (ref <130)
TRIGLYCERIDES: 109 mg/dL (ref <150)
VLDL: 22 mg/dL (ref <30)

## 2015-12-08 LAB — CBC WITH DIFFERENTIAL/PLATELET
BASOS ABS: 64 {cells}/uL (ref 0–200)
Basophils Relative: 1 %
EOS ABS: 128 {cells}/uL (ref 15–500)
EOS PCT: 2 %
HCT: 36.4 % — ABNORMAL LOW (ref 38.5–50.0)
Hemoglobin: 11.3 g/dL — ABNORMAL LOW (ref 13.0–17.0)
Lymphocytes Relative: 23 %
Lymphs Abs: 1472 cells/uL (ref 850–3900)
MCH: 21.8 pg — AB (ref 27.0–33.0)
MCHC: 31 g/dL — AB (ref 32.0–36.0)
MCV: 70.3 fL — AB (ref 80.0–100.0)
MONOS PCT: 8 %
Monocytes Absolute: 512 cells/uL (ref 200–950)
NEUTROS PCT: 66 %
Neutro Abs: 4224 cells/uL (ref 1500–7800)
PLATELETS: 217 10*3/uL (ref 140–400)
RBC: 5.18 MIL/uL (ref 4.20–5.80)
RDW: 17.8 % — ABNORMAL HIGH (ref 11.0–15.0)
WBC: 6.4 10*3/uL (ref 3.8–10.8)

## 2015-12-08 LAB — PSA: PSA: 1.8 ng/mL (ref <=4.0)

## 2015-12-08 LAB — TSH: TSH: 2.4 mIU/L (ref 0.40–4.50)

## 2015-12-08 LAB — MAGNESIUM: Magnesium: 2 mg/dL (ref 1.5–2.5)

## 2015-12-08 MED ORDER — METOCLOPRAMIDE HCL 5 MG PO TABS: 5.0000 mg | ORAL_TABLET | Freq: Three times a day (TID) | ORAL | 3 refills | Status: DC

## 2015-12-08 NOTE — Progress Notes (Signed)
Subjective:   Patient presents for Medicare Annual/Subsequent preventive examination.    Patient here for annual wellness exam. His continued concern is his chronic nausea  For the past 5 months associated with dizziness episodes. He is seeing ear nose and throat,  Neurology, gastroenterology. He's had endoscopy gastric emptying study he is at MRI the brain which have all been unremarkable. He is said to have MRA angiogram of his neck next week.  He's been tried on multiple medications for his nausea. He is currently on Phenergan however this makes him very sleepy. The only time he does not have nauseous when he is lying still or resting. He has nausea with eating but very minimal emesis.Marland Kitchen He has history of partial gastrectomy secondary to GI bleeding ulcers in the 1970s.  He's been on cholestyramine, Zofran, Phenergan, scopolamine, Carafate, over-the-counter antiacid   Review Past Medical/Family/Social: per EMR    Risk Factors  Current exercise habits: walks Dietary issues discussed: Chronic nausea   Cardiac risk factors: overweight   Depression Screen  (Note: if answer to either of the following is "Yes", a more complete depression screening is indicated)  Over the past two weeks, have you felt down, depressed or hopeless? No Over the past two weeks, have you felt little interest or pleasure in doing things?yes Have you lost interest or pleasure in daily life? No Do you often feel hopeless? No Do you cry easily over simple problems? No   Activities of Daily Living  In your present state of health, do you have any difficulty performing the following activities?:  Driving? No  Managing money? No  Feeding yourself? No  Getting from bed to chair? No  Climbing a flight of stairs? No  Preparing food and eating?: No  Bathing or showering? No  Getting dressed: No  Getting to the toilet? No  Using the toilet:No  Moving around from place to place: No  In the past year have you fallen  or had a near fall?:No  Are you sexually active? yes  Do you have more than one partner? No   Hearing Difficulties: No  Do you often ask people to speak up or repeat themselves? No  Do you experience ringing or noises in your ears? No Do you have difficulty understanding soft or whispered voices? No  Do you feel that you have a problem with memory? No Do you often misplace items? No  Do you feel safe at home? Yes  Cognitive Testing  Alert? Yes Normal Appearance?Yes  Oriented to person? Yes Place? Yes  Time? Yes  Recall of three objects? Yes  Can perform simple calculations? Yes  Displays appropriate judgment?Yes  Can read the correct time from a watch face?Yes   List the Names of Other Physician/Practitioners you currently use:   Screening Tests / Date Colonoscopy     UTD                 Zostavax  Due  Pneumonia- due  Influenza Vaccine -due  Tetanus/tdap- Pt thinks he had unsure   ROS: GEN- + fatigue,  Denies fever, weight loss,weakness, recent illness HEENT- denies eye drainage, change in vision, nasal discharge, CVS- denies chest pain, palpitations RESP- denies SOB, cough, wheeze ABD- denies N/V, change in stools, abd pain GU- denies dysuria, hematuria, dribbling, incontinence MSK- denies joint pain, muscle aches, injury Neuro- denies headache, dizziness, syncope, seizure activity  PHYSICAL: GEN- NAD, alert and oriented x3 HEENT- PERRL, EOMI, non injected sclera, pink conjunctiva, MMM, oropharynx  clear Neck- Supple, no thryomegaly CVS- RRR, no murmur RESP-CTAB ABD-NABS,soft, NT,ND GU- deferred EXT- No edema Pulses- Radial, DP- 2+  Pt asked to lay down to have labs drawn, sitting in room feeling nauseas over trash can   Assessment:    Annual wellness medicare exam   Plan:    During the course of the visit the patient was educated and counseled about appropriate screening and preventive services including:  Agrees to Prevnar 13 today  Wants to hold on flu  shot Labs per above   Screen Neg for depression. PHQ-6  score of 12 - mostly fatigue/nausea    Diet review for nutrition referral? Yes ____ Not Indicated __x__  Patient Instructions (the written plan) was given to the patient.  Medicare Attestation  I have personally reviewed:  The patient's medical and social history  Their use of alcohol, tobacco or illicit drugs  Their current medications and supplements  The patient's functional ability including ADLs,fall risks, home safety risks, cognitive, and hearing and visual impairment  Diet and physical activities  Evidence for depression or mood disorders  The patient's weight, height, BMI, and visual acuity have been recorded in the chart. I have made referrals, counseling, and provided education to the patient based on review of the above and I have provided the patient with a written personalized care plan for preventive services.

## 2015-12-08 NOTE — Assessment & Plan Note (Signed)
Known GERD continue carafate Very unclear cause of his chronic nausea with dizziness, seems more nausea than actual dizziness.  He is completing a workup by neurology but nothing central found to be causing his symptoms. I am checking his labs today including magnesium and A1c disease had elevated fasting glucose in the past. I will try him on Reglan he's tried multiple other medications. We will start the process [facility] as his local gastroenterologist has exhausted his workup and he has seen multiple other specialists.

## 2015-12-08 NOTE — Patient Instructions (Addendum)
Try the Reglan for the nausea  Pneumonia vaccine given - Prevnar 13  [facility]  F/U pending results

## 2015-12-09 LAB — IRON AND TIBC
%SAT: 7 % — ABNORMAL LOW (ref 15–60)
Iron: 26 ug/dL — ABNORMAL LOW (ref 50–180)
TIBC: 372 ug/dL (ref 250–425)
UIBC: 346 ug/dL (ref 125–400)

## 2015-12-09 LAB — FERRITIN: FERRITIN: 5 ng/mL — AB (ref 20–380)

## 2015-12-09 LAB — HEMOGLOBIN A1C
Hgb A1c MFr Bld: 5.3 % (ref <5.7)
MEAN PLASMA GLUCOSE: 105 mg/dL

## 2015-12-09 LAB — VITAMIN B12: Vitamin B-12: 272 pg/mL (ref 200–1100)

## 2015-12-09 LAB — RETICULOCYTES
ABS RETIC: 63600 {cells}/uL (ref 25000–90000)
RBC.: 5.3 MIL/uL (ref 4.20–5.80)
Retic Ct Pct: 1.2 %

## 2015-12-14 ENCOUNTER — Telehealth: Payer: Self-pay | Admitting: Physician Assistant

## 2015-12-14 NOTE — Telephone Encounter (Signed)
Patient wife states that she will need to obtain FMLA to assist patient to MD visits.   Advised to have paperwork faxed to our office.

## 2015-12-14 NOTE — Telephone Encounter (Signed)
[place], Mr. Marcina MillardRoach's wife returned Christina's call. She asked to be called after 2:30pm due to not being able to take calls at work.  In addition, she's asking if she needs to take out FMLA through her job. Marcy SalvoRaymond is "sick all the time" and she's wondering if he'll consistently have to [facility] and [facility] and other doctor appointments. She's his transportation and she has difficulty taking time off. She said [facility] to [facility] appointment but she's not called them back due to being told to wait on his MRI results before scheduling an appointment.  Please give her a phone call regarding this after 2:30pm today.  [place]'s ph# [phone]  Thank you.

## 2015-12-16 ENCOUNTER — Ambulatory Visit
Admission: RE | Admit: 2015-12-16 | Discharge: 2015-12-16 | Disposition: A | Discharge status: Home / Self Care | Payer: Medicare Other | Source: Ambulatory Visit | Attending: Neurology | Admitting: Neurology

## 2015-12-16 DIAGNOSIS — R42 Dizziness and giddiness: Secondary | ICD-10-CM | POA: Diagnosis not present

## 2015-12-16 DIAGNOSIS — R55 Syncope and collapse: Secondary | ICD-10-CM

## 2015-12-16 DIAGNOSIS — H93A9 Pulsatile tinnitus, unspecified ear: Secondary | ICD-10-CM

## 2015-12-16 DIAGNOSIS — R404 Transient alteration of awareness: Secondary | ICD-10-CM

## 2015-12-16 DIAGNOSIS — G458 Other transient cerebral ischemic attacks and related syndromes: Secondary | ICD-10-CM

## 2015-12-16 DIAGNOSIS — R110 Nausea: Secondary | ICD-10-CM

## 2015-12-21 ENCOUNTER — Telehealth: Payer: Self-pay | Admitting: *Deleted

## 2015-12-21 NOTE — Telephone Encounter (Signed)
LVM for pt wife about MRA unremarkable per Dr Lucia GaskinsAhern. Gave [facility] phone number if they have further questions/concerns.

## 2015-12-21 NOTE — Telephone Encounter (Signed)
-----   Message from Anson FretAntonia B Ahern, MD sent at 12/20/2015  9:58 AM EDT ----- MRA of the head and neck unremarkable thanks

## 2015-12-24 ENCOUNTER — Telehealth: Payer: Self-pay | Admitting: *Deleted

## 2015-12-24 NOTE — Telephone Encounter (Signed)
Received completed FMLA for patient wife Jorge Evans from provider.   Simple form charge per provider. Patient contacted to pay fee before FMLA to be sent.   Patient wife states that she does not think she requires this paperwork at this time. Will contact office if forms are required. Kept copy of forms in forms to keep folder.

## 2015-12-25 NOTE — Telephone Encounter (Signed)
Received call from patient wife, [place].   States that she will continue with FMLA. Call routed to front office to pay fee.

## 2016-06-06 ENCOUNTER — Ambulatory Visit: Payer: Medicare Other | Admitting: Neurology

## 2016-06-21 ENCOUNTER — Telehealth: Payer: Self-pay

## 2016-06-21 ENCOUNTER — Encounter (HOSPITAL_COMMUNITY): Payer: Self-pay | Admitting: Emergency Medicine

## 2016-06-21 DIAGNOSIS — R5381 Other malaise: Secondary | ICD-10-CM | POA: Diagnosis not present

## 2016-06-21 DIAGNOSIS — Z87891 Personal history of nicotine dependence: Secondary | ICD-10-CM | POA: Insufficient documentation

## 2016-06-21 DIAGNOSIS — R112 Nausea with vomiting, unspecified: Secondary | ICD-10-CM | POA: Insufficient documentation

## 2016-06-21 DIAGNOSIS — H9211 Otorrhea, right ear: Secondary | ICD-10-CM | POA: Diagnosis not present

## 2016-06-21 LAB — COMPREHENSIVE METABOLIC PANEL
ALT: 11 U/L — ABNORMAL LOW (ref 17–63)
AST: 17 U/L (ref 15–41)
Albumin: 3.9 g/dL (ref 3.5–5.0)
Alkaline Phosphatase: 72 U/L (ref 38–126)
Anion gap: 6 (ref 5–15)
BUN: 8 mg/dL (ref 6–20)
CHLORIDE: 109 mmol/L (ref 101–111)
CO2: 23 mmol/L (ref 22–32)
CREATININE: 0.94 mg/dL (ref 0.61–1.24)
Calcium: 8.7 mg/dL — ABNORMAL LOW (ref 8.9–10.3)
Glucose, Bld: 86 mg/dL (ref 65–99)
POTASSIUM: 3.9 mmol/L (ref 3.5–5.1)
SODIUM: 138 mmol/L (ref 135–145)
Total Bilirubin: 0.6 mg/dL (ref 0.3–1.2)
Total Protein: 6.2 g/dL — ABNORMAL LOW (ref 6.5–8.1)

## 2016-06-21 LAB — CBC WITH DIFFERENTIAL/PLATELET
Basophils Absolute: 0.1 10*3/uL (ref 0.0–0.1)
Basophils Relative: 1 %
EOS ABS: 0.1 10*3/uL (ref 0.0–0.7)
Eosinophils Relative: 2 %
HCT: 33.9 % — ABNORMAL LOW (ref 39.0–52.0)
Hemoglobin: 10.1 g/dL — ABNORMAL LOW (ref 13.0–17.0)
LYMPHS ABS: 1.4 10*3/uL (ref 0.7–4.0)
Lymphocytes Relative: 23 %
MCH: 21.1 pg — ABNORMAL LOW (ref 26.0–34.0)
MCHC: 29.8 g/dL — ABNORMAL LOW (ref 30.0–36.0)
MCV: 70.9 fL — ABNORMAL LOW (ref 78.0–100.0)
MONO ABS: 0.6 10*3/uL (ref 0.1–1.0)
Monocytes Relative: 10 %
Neutro Abs: 3.9 10*3/uL (ref 1.7–7.7)
Neutrophils Relative %: 64 %
PLATELETS: 184 10*3/uL (ref 150–400)
RBC: 4.78 MIL/uL (ref 4.22–5.81)
RDW: 17.8 % — AB (ref 11.5–15.5)
WBC: 6.1 10*3/uL (ref 4.0–10.5)

## 2016-06-21 LAB — URINALYSIS, ROUTINE W REFLEX MICROSCOPIC
BACTERIA UA: NONE SEEN
Bilirubin Urine: NEGATIVE
GLUCOSE, UA: NEGATIVE mg/dL
KETONES UR: 5 mg/dL — AB
Leukocytes, UA: NEGATIVE
NITRITE: NEGATIVE
PROTEIN: NEGATIVE mg/dL
Specific Gravity, Urine: 1.023 (ref 1.005–1.030)
Squamous Epithelial / LPF: NONE SEEN
pH: 5 (ref 5.0–8.0)

## 2016-06-21 NOTE — Telephone Encounter (Signed)
[place] called and stated Jorge Evans had problems with his right ear bleeding. There is no pain [place] stated the bleeding started when Jorge Evans was sleep.   Right ear has been bleeding for an 1 hour.When asked how bad the bleeding was [place] states he is soaking up a cloth as well as a q-tip. Explained to [place] that we did not have any openings today and that if Jorge Evans was bleeding that bad then he needed to go to an urgent care or the ED and get checked out.    [place] then stated that Jorge Evans stated the blood was starting to dry up I still recommended they follow up at either an Urgent Care or the ED [facility] . [place] stated she would take him to an Urgent Care

## 2016-06-21 NOTE — ED Triage Notes (Signed)
Pt st's earlier today he had nausea and vomiting.  St"s he vomited x's 5.  St's after vomiting he felt lightheaded and then found he had blood in right ear

## 2016-06-22 ENCOUNTER — Emergency Department (HOSPITAL_COMMUNITY)
Admission: EM | Admit: 2016-06-22 | Discharge: 2016-06-22 | Disposition: A | Discharge status: Home / Self Care | Payer: Medicare Other | Attending: Emergency Medicine | Admitting: Emergency Medicine

## 2016-06-22 DIAGNOSIS — R112 Nausea with vomiting, unspecified: Secondary | ICD-10-CM

## 2016-06-22 NOTE — ED Provider Notes (Signed)
[facility]-EMERGENCY DEPT Provider Note   CSN: [id number] Arrival date & time: 06/21/16  1941  By signing my name below, I, Majel Homer, attest that this documentation has been prepared under the direction and in the presence of Tomasita Crumble, MD . Electronically Signed: Majel Homer, Scribe. 06/22/2016. 4:05 AM.  History   Chief Complaint Chief Complaint  Patient presents with  . Nausea   The history is provided by the patient. No language interpreter was used.   HPI Comments: Mithran Strike. is a 66 y.o. male who presents to the Emergency Department complaining of persistent, nausea that began at ~10:00 AM yesterday morning. Pt reports ~7 "violent" episodes of associated vomiting and "soreness" behind his right ear. He states he took a nap yesterday afternoon and awoke from sleep at ~3:00 PM and noticed blood in his bilateral ears. He notes he inserted a que-tip "lightly" into his right ear and states it was "soaked with blood." Pt reports he visited an Urgent Care facility for his symptoms soon after and was advised to visit the ED for further evaluation. He denies abdominal pain, diarrhea, sick contacts, sore throat, cough, rhinorrhea, and any recent swimming.   Past Medical History:  Diagnosis Date  . GERD (gastroesophageal reflux disease)    Patient Active Problem List   Diagnosis Date Noted  . Dizziness 12/06/2015  . Dehydration 12/06/2015  . GERD (gastroesophageal reflux disease) 12/01/2014  . Idiopathic scoliosis 12/01/2014   Past Surgical History:  Procedure Laterality Date  . LUMBAR FUSION    . STOMACH SURGERY      Home Medications    Prior to Admission medications   Medication Sig Start Date End Date Taking? Authorizing Provider  gabapentin (NEURONTIN) 300 MG capsule 1 CAP AT NIGHT FOR 3-4 DAYS, THEN INCREASE TO TWICE A DAY X3-4 DAYS THEN 3 TIMES A DAY 11/07/14   Historical Provider, MD  metoCLOPramide (REGLAN) 5 MG tablet Take 1 tablet (5 mg total) by mouth 3 (three) times  daily before meals. 12/08/15   Salley Scarlet, MD  promethazine (PHENERGAN) 25 MG tablet Take 25 mg by mouth every 6 (six) hours. 07/28/15   Historical Provider, MD  sucralfate (CARAFATE) 1 G tablet Take 1 g by mouth 3 (three) times daily.    Historical Provider, MD    Family History Family History  Problem Relation Age of Onset  . Heart attack Mother   . Emphysema Father   . Cancer Father     lung    Social History Social History  Substance Use Topics  . Smoking status: Former Games developer  . Smokeless tobacco: Never Used  . Alcohol use No   Allergies   Demerol [meperidine]  Review of Systems Review of Systems  HENT: Positive for ear pain. Negative for rhinorrhea and sore throat.   Respiratory: Negative for cough.   Gastrointestinal: Positive for nausea and vomiting. Negative for abdominal pain and diarrhea.  All other systems reviewed and are negative.  Physical Exam Updated Vital Signs BP 133/63   Pulse (!) 58   Temp 98.5 F (36.9 C) (Oral)   Resp 18   Ht 5\' 6"  (1.676 m)   Wt 160 lb (72.6 kg)   SpO2 98%   BMI 25.82 kg/m   Physical Exam  Constitutional: He is oriented to person, place, and time. Vital signs are normal. He appears well-developed and well-nourished.  Non-toxic appearance. He does not appear ill. No distress.  HENT:  Head: Normocephalic and atraumatic.  Nose: Nose  normal.  Mouth/Throat: Oropharynx is clear and moist. No oropharyngeal exudate.  Trace dry blood in both ear canals. TM appears normal without signs of infection.   Eyes: Conjunctivae and EOM are normal. Pupils are equal, round, and reactive to light. No scleral icterus.  Neck: Normal range of motion. Neck supple. No tracheal deviation, no edema, no erythema and normal range of motion present. No thyroid mass and no thyromegaly present.  Cardiovascular: Normal rate, regular rhythm, S1 normal, S2 normal, normal heart sounds, intact distal pulses and normal pulses.  Exam reveals no gallop and no  friction rub.   No murmur heard. Pulmonary/Chest: Effort normal and breath sounds normal. No respiratory distress. He has no wheezes. He has no rhonchi. He has no rales.  Abdominal: Soft. Normal appearance and bowel sounds are normal. He exhibits no distension, no ascites and no mass. There is no hepatosplenomegaly. There is no tenderness. There is no rebound, no guarding and no CVA tenderness.  Musculoskeletal: Normal range of motion. He exhibits no edema or tenderness.  Lymphadenopathy:    He has no cervical adenopathy.  Neurological: He is alert and oriented to person, place, and time. He has normal strength. No cranial nerve deficit or sensory deficit.  Skin: Skin is warm, dry and intact. No petechiae and no rash noted. He is not diaphoretic. No erythema. No pallor.  Nursing note and vitals reviewed.  ED Treatments / Results  DIAGNOSTIC STUDIES:  Oxygen Saturation is 98% on RA, normal by my interpretation.    COORDINATION OF CARE:  3:52 AM Discussed treatment plan with pt at bedside and pt agreed to plan.  Labs (all labs ordered are listed, but only abnormal results are displayed) Labs Reviewed  CBC WITH DIFFERENTIAL/PLATELET - Abnormal; Notable for the following:       Result Value   Hemoglobin 10.1 (*)    HCT 33.9 (*)    MCV 70.9 (*)    MCH 21.1 (*)    MCHC 29.8 (*)    RDW 17.8 (*)    All other components within normal limits  COMPREHENSIVE METABOLIC PANEL - Abnormal; Notable for the following:    Calcium 8.7 (*)    Total Protein 6.2 (*)    ALT 11 (*)    All other components within normal limits  URINALYSIS, ROUTINE W REFLEX MICROSCOPIC - Abnormal; Notable for the following:    Hgb urine dipstick SMALL (*)    Ketones, ur 5 (*)    All other components within normal limits    EKG  EKG Interpretation  Date/Time:  Wednesday June 22 2016 04:35:32 EDT Ventricular Rate:  60 PR Interval:    QRS Duration: 139 QT Interval:  414 QTC Calculation: 414 R Axis:   -24 Text  Interpretation:  Sinus rhythm Right bundle branch block, new since last tracing Confirmed by Erroll Luna [phone]) on 06/22/2016 5:05:21 AM       Radiology No results found.  Procedures Procedures (including critical care time)  Medications Ordered in ED Medications - No data to display   Initial Impression / Assessment and Plan / ED Course  I have reviewed the triage vital signs and the nursing notes.  Pertinent labs & imaging results that were available during my care of the patient were reviewed by me and considered in my medical decision making (see chart for details).     Patient presents to the ED for vomiting and blood in ears.  I do see the dried blood but no evidence  of barotrauma.  By history, it seems that the violent vomiting may have caused this to occur bilaterally. He has been covered by abx by the urgent care and will continue to ofloxacin drops.  EKG does not show any ischemia.  No further vomiting in the ED.  He appears well and in NAD.  PCP fu advised within 3 days.  VS remain within his normal limits and he is safe for DC.    I personally performed the services described in this documentation, which was scribed in my presence. The recorded information has been reviewed and is accurate.     Final Clinical Impressions(s) / ED Diagnoses   Final diagnoses:  Non-intractable vomiting with nausea, unspecified vomiting type    New Prescriptions New Prescriptions   No medications on file     Tomasita CrumbleAdeleke Elysha Daw, MD 06/22/16 [phone]

## 2016-06-27 ENCOUNTER — Ambulatory Visit (INDEPENDENT_AMBULATORY_CARE_PROVIDER_SITE_OTHER): Payer: Medicare Other | Admitting: Physician Assistant

## 2016-06-27 ENCOUNTER — Encounter: Payer: Self-pay | Admitting: Physician Assistant

## 2016-06-27 VITALS — BP 130/80 | HR 52 | Temp 97.9°F | Resp 14 | Wt 164.2 lb

## 2016-06-27 DIAGNOSIS — Z09 Encounter for follow-up examination after completed treatment for conditions other than malignant neoplasm: Secondary | ICD-10-CM

## 2016-06-27 DIAGNOSIS — H6501 Acute serous otitis media, right ear: Secondary | ICD-10-CM

## 2016-06-27 MED ORDER — AMOXICILLIN-POT CLAVULANATE 875-125 MG PO TABS: 1.0000 | ORAL_TABLET | Freq: Two times a day (BID) | ORAL | 0 refills | Status: DC

## 2016-06-27 NOTE — Progress Notes (Signed)
Patient ID: Jorge Evans. MRN: [id number], DOB: 1950-10-22, 66 y.o. Date of Encounter: @DATE @  Chief Complaint:  Chief Complaint  Patient presents with  . ear bleed follow up    HPI: 66 y.o. year old male  presents with above.  He is here for follow-up after his recent ER visit on 06/21/2016. He reported persistent nausea that had begun at about 10 AM the prior morning.  He reported ~ 7  episodes of "violent" vomiting and soreness behind his right ear. He took a nap the prior afternoon and when he woke from sleep at about 3 PM noticed some blood in both ears. He inserted a Q-tip lightly into his right ear and said that it was "soaked with blood ". Exam at the ER there exam notes that he had trace dry blood in both ear canals. TM appeared normal without signs of infection. Felt that it was possible that the violent vomiting may have caused the bleeding to occur. Prescribed antibiotic otic drops and to follow-up with PCP so he presents for that follow-up visit. He says he's been applying the drops as directed. He states that he was seeing a small amount of blood for the first couple of days after the ER visit but has seen no blood since then. No further nausea or vomiting. Says right ear feels clogged up.   Past Medical History:  Diagnosis Date  . GERD (gastroesophageal reflux disease)      Home Meds: Outpatient Medications Prior to Visit  Medication Sig Dispense Refill  . sucralfate (CARAFATE) 1 G tablet Take 1 g by mouth 3 (three) times daily.    Marland Kitchen gabapentin (NEURONTIN) 300 MG capsule 1 CAP AT NIGHT FOR 3-4 DAYS, THEN INCREASE TO TWICE A DAY X3-4 DAYS THEN 3 TIMES A DAY  2  . metoCLOPramide (REGLAN) 5 MG tablet Take 1 tablet (5 mg total) by mouth 3 (three) times daily before meals. 90 tablet 3  . promethazine (PHENERGAN) 25 MG tablet Take 25 mg by mouth every 6 (six) hours.  3   No facility-administered medications prior to visit.     Allergies:  Allergies  Allergen  Reactions  . Demerol [Meperidine]     Social History   Social History  . Marital status: Married    Spouse name: [place]  . Number of children: 1  . Years of education: 21 +   Occupational History  . Retired    Social History Main Topics  . Smoking status: Former Games developer  . Smokeless tobacco: Never Used  . Alcohol use No  . Drug use: No  . Sexual activity: Not on file   Other Topics Concern  . Not on file   Social History Narrative   Lives w/ wife   Caffeine use: 2 cups coffee per week    Family History  Problem Relation Age of Onset  . Heart attack Mother   . Emphysema Father   . Cancer Father     lung     Review of Systems:  See HPI for pertinent ROS. All other ROS negative.    Physical Exam: Blood pressure 130/80, pulse (!) 52, temperature 97.9 F (36.6 C), temperature source Oral, resp. rate 14, weight 164 lb 3.2 oz (74.5 kg), SpO2 98 %., Body mass index is 26.5 kg/m. General: WM. Appears in no acute distress. Head: Normocephalic, atraumatic, eyes without discharge, sclera non-icteric, nares are without discharge. Bilateral auditory canals clear: Inferior portions of bilateral ear canals with  small areas of scabs. Otherwise, ear canals appear normal. No erythema, no swelling, no drainage. Left TM appears normal. Right TM---Superior half of right TM has bright/deep red erythema. Lower half of right TM is golden color.   TM's are without perforation.  Neck: Supple. No thyromegaly. No lymphadenopathy. Lungs: Clear bilaterally to auscultation without wheezes, rales, or rhonchi. Breathing is unlabored. Heart: RRR with S1 S2. No murmurs, rubs, or gallops. Musculoskeletal:  Strength and tone normal for age. Extremities/Skin: Warm and dry. Neuro: Alert and oriented X 3. Moves all extremities spontaneously. Gait is normal. CNII-XII grossly in tact. Psych:  Responds to questions appropriately with a normal affect.     ASSESSMENT AND PLAN:  66 y.o. year old male with   1. Hospital discharge follow-up  2. Right acute serous otitis media, recurrence not specified Told him not to use any Q-tips or anything in his ears at all until next visit. He is to take the Augmentin as directed. He is to schedule follow-up office visit here in 2 weeks. F/U sooner if needed. - amoxicillin-clavulanate (AUGMENTIN) 875-125 MG tablet; Take 1 tablet by mouth 2 (two) times daily.  Dispense: 20 tablet; Refill: 0   Signed, [address]Mary Beth MansfieldDixon, [place]PA, [facility] 06/27/2016 4:32 PM

## 2016-07-11 ENCOUNTER — Ambulatory Visit (INDEPENDENT_AMBULATORY_CARE_PROVIDER_SITE_OTHER): Payer: Medicare Other | Admitting: Physician Assistant

## 2016-07-11 ENCOUNTER — Encounter: Payer: Self-pay | Admitting: Physician Assistant

## 2016-07-11 VITALS — BP 128/80 | HR 60 | Temp 98.0°F | Resp 16 | Wt 164.0 lb

## 2016-07-11 DIAGNOSIS — D509 Iron deficiency anemia, unspecified: Secondary | ICD-10-CM | POA: Diagnosis not present

## 2016-07-11 DIAGNOSIS — Z09 Encounter for follow-up examination after completed treatment for conditions other than malignant neoplasm: Secondary | ICD-10-CM | POA: Diagnosis not present

## 2016-07-11 DIAGNOSIS — Z8669 Personal history of other diseases of the nervous system and sense organs: Secondary | ICD-10-CM | POA: Diagnosis not present

## 2016-07-11 MED ORDER — FERROUS SULFATE 325 (65 FE) MG PO TABS: 325.0000 mg | ORAL_TABLET | Freq: Three times a day (TID) | ORAL | 1 refills | Status: DC

## 2016-07-11 NOTE — Progress Notes (Signed)
Patient ID: Jorge Evans. MRN: [id number], DOB: [birth year], 66 y.o. Date of Encounter: @  Chief Complaint:  Chief Complaint  Patient presents with  . Ear Pain    f/u  . Fatigue    sometimes not every day     HPI: 66 y.o. year old male  presents with above.  06/27/2016: He is here for follow-up after his recent ER visit on 06/21/2016. He reported persistent nausea that had begun at about 10 AM the prior morning.  He reported ~ 7  episodes of "violent" vomiting and soreness behind his right ear. He took a nap the prior afternoon and when he woke from sleep at about 3 PM noticed some blood in both ears. He inserted a Q-tip lightly into his right ear and said that it was "soaked with blood ". Exam at the ER there exam notes that he had trace dry blood in both ear canals. TM appeared normal without signs of infection. Felt that it was possible that the violent vomiting may have caused the bleeding to occur. Prescribed antibiotic otic drops and to follow-up with PCP so he presents for that follow-up visit. He says he's been applying the drops as directed. He states that he was seeing a small amount of blood for the first couple of days after the ER visit but has seen no blood since then. No further nausea or vomiting. Says right ear feels clogged up. 1. Hospital discharge follow-up 2. Right acute serous otitis media, recurrence not specified Told him not to use any Q-tips or anything in his ears at all until next visit. He is to take the Augmentin as directed. He is to schedule follow-up office visit here in 2 weeks. F/U sooner if needed. - amoxicillin-clavulanate (AUGMENTIN) 875-125 MG tablet; Take 1 tablet by mouth 2 (two) times daily.  Dispense: 20 tablet; Refill: 0  07/11/2016: Today he reports that he did take the Augmentin as directed. Took alll of it. Today he is requesting refill on his Rx Ferrous Sulfate---says noticed that it was filled a year ago and scared to take old"  medicine. Says he had EGD and Colonoscopy by [name] 3 months ago and "those were both okay" No other concerns to address today.  Past Medical History:  Diagnosis Date  . GERD (gastroesophageal reflux disease)      Home Meds: Outpatient Medications Prior to Visit  Medication Sig Dispense Refill  . ofloxacin (FLOXIN) 0.3 % otic solution Place 10 mLs into the right ear daily. Two drops in left ear twice    . sucralfate (CARAFATE) 1 G tablet Take 1 g by mouth 3 (three) times daily.    Marland Kitchen amoxicillin-clavulanate (AUGMENTIN) 875-125 MG tablet Take 1 tablet by mouth 2 (two) times daily. 20 tablet 0   No facility-administered medications prior to visit.     Allergies:  Allergies  Allergen Reactions  . Demerol [Meperidine]     Social History   Social History  . Marital status: Married    Spouse name: [place]  . Number of children: 1  . Years of education: 19 +   Occupational History  . Retired    Social History Main Topics  . Smoking status: Former Games developer  . Smokeless tobacco: Never Used  . Alcohol use No  . Drug use: No  . Sexual activity: Not on file   Other Topics Concern  . Not on file   Social History Narrative   Lives w/ wife  Caffeine use: 2 cups coffee per week    Family History  Problem Relation Age of Onset  . Heart attack Mother   . Emphysema Father   . Cancer Father     lung     Review of Systems:  See HPI for pertinent ROS. All other ROS negative.    Physical Exam: Blood pressure 128/80, pulse 60, temperature 98 F (36.7 C), temperature source Oral, resp. rate 16, weight 164 lb (74.4 kg), SpO2 98 %., Body mass index is 26.47 kg/m. General: WM. Appears in no acute distress. Head: Normocephalic, atraumatic, eyes without discharge, sclera non-icteric, nares are without discharge.  Left Ear: Canal is normal. TM is normal.  Right Ear: canal still with scabbed area in inferior portion of this ear canal. This TM is now clear/normal.  Neck: Supple.  No thyromegaly. No lymphadenopathy. Lungs: Clear bilaterally to auscultation without wheezes, rales, or rhonchi. Breathing is unlabored. Heart: RRR with S1 S2. No murmurs, rubs, or gallops. Musculoskeletal:  Strength and tone normal for age. Extremities/Skin: Warm and dry. Neuro: Alert and oriented X 3. Moves all extremities spontaneously. Gait is normal. CNII-XII grossly in tact. Psych:  Responds to questions appropriately with a normal affect.     ASSESSMENT AND PLAN:  66 y.o. year old male  1. Iron deficiency anemia, unspecified iron deficiency anemia type Reviewed his chart. 06/21/16 hemoglobin was 10.1 hematocrit 33.9 with hypochromic microcytic indices.  12/08/15 ferritin was low as well as iron. At that time Dr. Jeanice Lim prescribed iron but also told him to follow-up with GI.  Patient states that he saw [name] and had EGD and colonoscopy both by Dr. hung just 3 months ago.  Give him a new prescription to get back on ferrous sulfate but that he needs to have follow-up with Dr. Jeanice Lim regarding this.  He voices understanding and agrees and is scheduling follow-up visit with Dr. Jeanice Lim in 2 months. - ferrous sulfate 325 (65 FE) MG tablet; Take 1 tablet (325 mg total) by mouth 3 (three) times daily with meals.  Dispense: 90 tablet; Refill: 1  2. Otitis media follow-up, infection resolved --Resolved. Do not pick at ear canal. Do not stick Q-Tip or anything in ear canal.    Signed, Frazier Richards, [place], [facility] 07/11/2016 4:09 PM

## 2016-09-12 ENCOUNTER — Encounter: Payer: Self-pay | Admitting: Family Medicine

## 2016-09-12 ENCOUNTER — Ambulatory Visit (INDEPENDENT_AMBULATORY_CARE_PROVIDER_SITE_OTHER): Payer: Medicare Other | Admitting: Family Medicine

## 2016-09-12 VITALS — BP 126/82 | HR 60 | Temp 98.2°F | Resp 14 | Ht 64.0 in | Wt 161.0 lb

## 2016-09-12 DIAGNOSIS — R110 Nausea: Secondary | ICD-10-CM | POA: Diagnosis not present

## 2016-09-12 DIAGNOSIS — E162 Hypoglycemia, unspecified: Secondary | ICD-10-CM | POA: Diagnosis not present

## 2016-09-12 DIAGNOSIS — D509 Iron deficiency anemia, unspecified: Secondary | ICD-10-CM | POA: Diagnosis not present

## 2016-09-12 DIAGNOSIS — K219 Gastro-esophageal reflux disease without esophagitis: Secondary | ICD-10-CM

## 2016-09-12 MED ORDER — DEXLANSOPRAZOLE 60 MG PO CPDR: 60.0000 mg | DELAYED_RELEASE_CAPSULE | Freq: Every day | ORAL | 0 refills | Status: DC

## 2016-09-12 NOTE — Patient Instructions (Signed)
Try the dexilant on a empty stomach F/U Sept for Physical

## 2016-09-12 NOTE — Assessment & Plan Note (Signed)
Eat regular basis, he has had gastric emptying no gastroparesis, ? Absorption problem, but no significant weight changes

## 2016-09-12 NOTE — Assessment & Plan Note (Signed)
Chronic nausea, history of reflux Given Dexilant to try in AM, hold carafate, only use phenergan if severe nausea Has appt GI next month

## 2016-09-12 NOTE — Assessment & Plan Note (Signed)
Check CBC, iron stores again

## 2016-09-12 NOTE — Progress Notes (Signed)
   Subjective:    Patient ID: Jorge Piaymond N Sublette Jr., male    DOB: [birth year], 66 y.o.   MRN: [id number]  Patient presents for Itching in Ears (states that he had bleeding from ears with infection, but that has resolved, but has intense itching) and Nausea (reports increase in nausea, even with takng Carafate)   Has some itching in his right ear had ear infection, was on drops,   He's been tried on multiple medications for his nausea. He is currently on Phenergan however this makes him very sleepy. The only time he does not have nauseous when he is lying still or resting. He has nausea with eating but very minimal emesis.Marland Kitchen. He has history of partial gastrectomy secondary to GI bleeding ulcers in the 1970s.  He's been on cholestyramine, Zofran, Phenergan, scopolamine, Carafate, over-the-counter antiacid   History of iron defeciciany- Dr.Hung ,he stopped iron a couple of months ago, had EGD and colonoscopy last year, states 1 polyp otherwise normal. No cause of his chronic nausea found either  He also gets symptoms of nausea, dizziness when his glucose is low, resolves with eating cookie or drinking coke, he had normal A1C X 2 in past 18 months Review Of Systems:  GEN- denies fatigue, fever, weight loss,weakness, recent illness HEENT- denies eye drainage, change in vision, nasal discharge, CVS- denies chest pain, palpitations RESP- denies SOB, cough, wheeze ABD- +N/V, change in stools, abd pain GU- denies dysuria, hematuria, dribbling, incontinence MSK- denies joint pain, muscle aches, injury Neuro- denies headache, dizziness, syncope, seizure activity       Objective:    BP 126/82   Pulse 60   Temp 98.2 F (36.8 C) (Oral)   Resp 14   Ht 5\' 4"  (1.626 m)   Wt 161 lb (73 kg)   SpO2 98%   BMI 27.64 kg/m  GEN- NAD, alert and oriented x3 HEENT- PERRL, EOMI, non injected sclera, pink conjunctiva, MMM, oropharynx clear TM clear, canals clear with some mild dry  skin CVS- RRR, no murmur RESP-CTAB ABD-NABS,soft,NT,ND EXT- No edema Pulses- Radial 2+        Assessment & Plan:      Problem List Items Addressed This Visit    Iron deficiency anemia - Primary    Check CBC, iron stores again      Relevant Orders   CBC with Differential/Platelet   Iron, TIBC and Ferritin Panel   Hypoglycemia    Eat regular basis, he has had gastric emptying no gastroparesis, ? Absorption problem, but no significant weight changes       GERD (gastroesophageal reflux disease)    Chronic nausea, history of reflux Given Dexilant to try in AM, hold carafate, only use phenergan if severe nausea Has appt GI next month      Relevant Medications   dexlansoprazole (DEXILANT) 60 MG capsule   Other Relevant Orders   Comprehensive metabolic panel   Chronic nausea   Relevant Orders   Comprehensive metabolic panel      Note: This dictation was prepared with Dragon dictation along with smaller phrase technology. Any transcriptional errors that result from this process are unintentional.

## 2016-09-13 ENCOUNTER — Other Ambulatory Visit: Payer: Self-pay

## 2016-09-13 DIAGNOSIS — D509 Iron deficiency anemia, unspecified: Secondary | ICD-10-CM

## 2016-09-13 LAB — COMPREHENSIVE METABOLIC PANEL
ALK PHOS: 79 U/L (ref 40–115)
ALT: 10 U/L (ref 9–46)
AST: 12 U/L (ref 10–35)
Albumin: 3.8 g/dL (ref 3.6–5.1)
BUN: 14 mg/dL (ref 7–25)
CO2: 20 mmol/L (ref 20–31)
Calcium: 8.4 mg/dL — ABNORMAL LOW (ref 8.6–10.3)
Chloride: 107 mmol/L (ref 98–110)
Creat: 0.93 mg/dL (ref 0.70–1.25)
GLUCOSE: 85 mg/dL (ref 70–99)
POTASSIUM: 4.1 mmol/L (ref 3.5–5.3)
Sodium: 137 mmol/L (ref 135–146)
Total Bilirubin: 0.4 mg/dL (ref 0.2–1.2)
Total Protein: 6.1 g/dL (ref 6.1–8.1)

## 2016-09-13 LAB — CBC WITH DIFFERENTIAL/PLATELET
BASOS ABS: 58 {cells}/uL (ref 0–200)
Basophils Relative: 1 %
EOS ABS: 116 {cells}/uL (ref 15–500)
Eosinophils Relative: 2 %
HCT: 37.2 % — ABNORMAL LOW (ref 38.5–50.0)
Hemoglobin: 11.4 g/dL — ABNORMAL LOW (ref 13.0–17.0)
LYMPHS PCT: 29 %
Lymphs Abs: 1682 cells/uL (ref 850–3900)
MCH: 22.5 pg — AB (ref 27.0–33.0)
MCHC: 30.6 g/dL — ABNORMAL LOW (ref 32.0–36.0)
MCV: 73.4 fL — AB (ref 80.0–100.0)
MONO ABS: 464 {cells}/uL (ref 200–950)
MONOS PCT: 8 %
NEUTROS PCT: 60 %
Neutro Abs: 3480 cells/uL (ref 1500–7800)
PLATELETS: 198 10*3/uL (ref 140–400)
RBC: 5.07 MIL/uL (ref 4.20–5.80)
RDW: 18.3 % — AB (ref 11.0–15.0)
WBC: 5.8 10*3/uL (ref 3.8–10.8)

## 2016-09-13 LAB — IRON,TIBC AND FERRITIN PANEL
%SAT: 14 % — AB (ref 15–60)
FERRITIN: 4 ng/mL — AB (ref 20–380)
Iron: 49 ug/dL — ABNORMAL LOW (ref 50–180)
TIBC: 346 ug/dL (ref 250–425)

## 2016-09-13 MED ORDER — FERROUS SULFATE 325 (65 FE) MG PO TABS: 325.0000 mg | ORAL_TABLET | Freq: Three times a day (TID) | ORAL | 1 refills | Status: DC

## 2016-10-31 DIAGNOSIS — R110 Nausea: Secondary | ICD-10-CM | POA: Diagnosis not present

## 2016-10-31 DIAGNOSIS — R634 Abnormal weight loss: Secondary | ICD-10-CM | POA: Diagnosis not present

## 2016-10-31 DIAGNOSIS — K219 Gastro-esophageal reflux disease without esophagitis: Secondary | ICD-10-CM | POA: Diagnosis not present

## 2016-10-31 DIAGNOSIS — R42 Dizziness and giddiness: Secondary | ICD-10-CM | POA: Diagnosis not present

## 2016-11-01 ENCOUNTER — Other Ambulatory Visit: Payer: Self-pay | Admitting: Gastroenterology

## 2016-11-01 DIAGNOSIS — R110 Nausea: Secondary | ICD-10-CM

## 2016-11-11 ENCOUNTER — Other Ambulatory Visit: Payer: Self-pay

## 2016-11-11 ENCOUNTER — Encounter (HOSPITAL_COMMUNITY): Payer: Self-pay | Admitting: Emergency Medicine

## 2016-11-11 ENCOUNTER — Emergency Department (HOSPITAL_COMMUNITY)
Admission: EM | Admit: 2016-11-11 | Discharge: 2016-11-11 | Disposition: A | Discharge status: Home / Self Care | Payer: Medicare Other | Attending: Emergency Medicine | Admitting: Emergency Medicine

## 2016-11-11 ENCOUNTER — Emergency Department (HOSPITAL_COMMUNITY): Payer: Medicare Other

## 2016-11-11 ENCOUNTER — Ambulatory Visit (HOSPITAL_COMMUNITY)
Admission: RE | Admit: 2016-11-11 | Discharge: 2016-11-11 | Disposition: A | Discharge status: Home / Self Care | Payer: Medicare Other | Source: Ambulatory Visit | Attending: Gastroenterology | Admitting: Gastroenterology

## 2016-11-11 DIAGNOSIS — Z87891 Personal history of nicotine dependence: Secondary | ICD-10-CM | POA: Insufficient documentation

## 2016-11-11 DIAGNOSIS — R110 Nausea: Secondary | ICD-10-CM

## 2016-11-11 DIAGNOSIS — R112 Nausea with vomiting, unspecified: Secondary | ICD-10-CM | POA: Insufficient documentation

## 2016-11-11 DIAGNOSIS — Z79899 Other long term (current) drug therapy: Secondary | ICD-10-CM | POA: Insufficient documentation

## 2016-11-11 DIAGNOSIS — R55 Syncope and collapse: Secondary | ICD-10-CM | POA: Diagnosis not present

## 2016-11-11 LAB — CBC WITH DIFFERENTIAL/PLATELET
Basophils Absolute: 0 10*3/uL (ref 0.0–0.1)
Basophils Relative: 0 %
EOS PCT: 0 %
Eosinophils Absolute: 0 10*3/uL (ref 0.0–0.7)
HCT: 40.6 % (ref 39.0–52.0)
Hemoglobin: 13.1 g/dL (ref 13.0–17.0)
LYMPHS ABS: 1 10*3/uL (ref 0.7–4.0)
LYMPHS PCT: 11 %
MCH: 24.2 pg — AB (ref 26.0–34.0)
MCHC: 32.3 g/dL (ref 30.0–36.0)
MCV: 74.9 fL — AB (ref 78.0–100.0)
MONOS PCT: 5 %
Monocytes Absolute: 0.5 10*3/uL (ref 0.1–1.0)
Neutro Abs: 8.2 10*3/uL — ABNORMAL HIGH (ref 1.7–7.7)
Neutrophils Relative %: 84 %
PLATELETS: 167 10*3/uL (ref 150–400)
RBC: 5.42 MIL/uL (ref 4.22–5.81)
RDW: 17.2 % — ABNORMAL HIGH (ref 11.5–15.5)
WBC: 9.7 10*3/uL (ref 4.0–10.5)

## 2016-11-11 LAB — I-STAT TROPONIN, ED: Troponin i, poc: 0 ng/mL (ref 0.00–0.08)

## 2016-11-11 LAB — COMPREHENSIVE METABOLIC PANEL
ALBUMIN: 4 g/dL (ref 3.5–5.0)
ALT: 15 U/L — AB (ref 17–63)
AST: 22 U/L (ref 15–41)
Alkaline Phosphatase: 84 U/L (ref 38–126)
Anion gap: 9 (ref 5–15)
BILIRUBIN TOTAL: 0.6 mg/dL (ref 0.3–1.2)
BUN: 11 mg/dL (ref 6–20)
CHLORIDE: 110 mmol/L (ref 101–111)
CO2: 19 mmol/L — ABNORMAL LOW (ref 22–32)
CREATININE: 0.83 mg/dL (ref 0.61–1.24)
Calcium: 9 mg/dL (ref 8.9–10.3)
GFR calc Af Amer: >60 mL/min (ref >60)
GLUCOSE: 86 mg/dL (ref 65–99)
Potassium: 4.5 mmol/L (ref 3.5–5.1)
Sodium: 138 mmol/L (ref 135–145)
Total Protein: 6.7 g/dL (ref 6.5–8.1)

## 2016-11-11 LAB — LIPASE, BLOOD: Lipase: 20 U/L (ref 11–51)

## 2016-11-11 MED ORDER — ONDANSETRON HCL 4 MG/2ML IJ SOLN
4.0000 mg | Freq: Once | INTRAMUSCULAR | Status: AC
  Administered 2016-11-11: 4 mg via INTRAVENOUS
  Filled 2016-11-11: qty 2

## 2016-11-11 MED ORDER — SODIUM CHLORIDE 0.9 % IV BOLUS (SEPSIS)
1000.0000 mL | Freq: Once | INTRAVENOUS | Status: AC
  Administered 2016-11-11: 1000 mL via INTRAVENOUS

## 2016-11-11 MED ORDER — ONDANSETRON 4 MG PO TBDP: 4.0000 mg | ORAL_TABLET | Freq: Three times a day (TID) | ORAL | 0 refills | Status: DC | PRN

## 2016-11-11 MED ORDER — TECHNETIUM TC 99M MEBROFENIN IV KIT
5.1500 | PACK | Freq: Once | INTRAVENOUS | Status: AC | PRN
  Administered 2016-11-11: 5.15 via INTRAVENOUS

## 2016-11-11 NOTE — ED Notes (Signed)
The pt  Reports that he feels better  Blanket given  1000 ccnss infused

## 2016-11-11 NOTE — ED Provider Notes (Signed)
[facility] Emergency Department Provider Note  ED Clinical Impression   Nausea and vomiting, intractability of vomiting not specified, unspecified vomiting type  History   Chief Complaint Emesis and Near Syncope   HPI  Patient is a 66 y.o. male with a PMH of GERD, chronic nausea and vomiting, and stomach ulcers who presents to ED for nausea and vomiting, states onset after he had HIDA scan this morning for persistent nausea, drank Ensure and was discharged. While leaving hospital after scan, he felt nauseous and dizzy, went to the bathroom and had episode of nonbloody emesis. Patient continued to go outside to get some fresh air, continued to vomit, staff approached him and brought him into the ER for further evaluation. No syncope. States that he has vomiting approx 4 times. States he has a lactose allergy and has had vomiting after drinking Ensure in the past. Denies fevers, chills, unexplained weight loss, vision or gait changes, headaches, neck pain, CP, SOB, cough, hemoptysis, abdominal pain, diarrhea, hematemesis, melena, heamtochezia, dyrusia, extremity numbness/tingling/weakness, or any additional concerns. No anticoagulant use. GI doctor is [name].  Past Medical History:  Diagnosis Date  . GERD (gastroesophageal reflux disease)     Past Surgical History:  Procedure Laterality Date  . LUMBAR FUSION    . STOMACH SURGERY      Current Outpatient Rx  . Order #: [id number] Class: Sample  . Order #: [id number] Class: Normal  . Order #: [id number] Class: Historical Med  . Order #: [id number] Class: Historical Med    Allergies Demerol [meperidine]  Family History  Problem Relation Age of Onset  . Heart attack Mother   . Emphysema Father   . Cancer Father        lung    Social History Social History  Substance Use Topics  . Smoking status: Former Games developer  . Smokeless tobacco: Never Used  . Alcohol use No    Review of Systems  Constitutional: Negative for fever, chills,  or unexplained weight loss. Eyes: Negative for visual changes. ENT: Negative for nasal congestion, ear pain, or sore throat. Cardiovascular: Negative for chest pain, palpitations, or extremity swelling. Respiratory: Negative for shortness of breath or cough. Gastrointestinal: + nausea, vomiting. Negative for abdominal pain, diarrhea, hematemesis, melena, or hematochezia. Genitourinary: Negative for dysuria, urinary frequency, or hematuria. Musculoskeletal: Negative for back pain or extremity pain/swelling. Skin: Negative for rash. Neurological: +dizziness, since resolved. Negative for headaches, focal weakness, or extremity numbness/tingling.  Physical Exam   VITAL SIGNS:   ED Triage Vitals  Enc Vitals Group     BP 11/11/16 1423 (!) 149/62     Pulse Rate 11/11/16 1423 73     Resp 11/11/16 1423 16     Temp 11/11/16 1423 (!) 97.2 F (36.2 C)     Temp Source 11/11/16 1423 Oral     SpO2 11/11/16 1423 99 %     Weight 11/11/16 1424 161 lb (73 kg)     Height --      Head Circumference --      Peak Flow --      Pain Score --      Pain Loc --      Pain Edu? --      Excl. in GC? --     Constitutional: Alert and oriented. Well appearing and in no respiratory apparent distress. Eyes: PERRL, EOMI, Conjunctivae normal ENT      Head: Normocephalic and atraumatic.      Ears: TM intact bilaterally without erythema or effusion, no  hemotympanum, external ear canals normal.       Nose: No congestion.      Mouth/Throat: Mucous membranes are moist. Oropharynx without erythema or exudate. No trismus. Normal voice, handling secretions normally.      Neck: Supple, no nuchal signs, full active ROM of neck. Cardiovascular: Normal S1 S2, regular rhythm, normal rate. Normal and symmetric distal pulses are present in all extremities. Respiratory: Breath sounds clear and equal bilaterally. No wheezes, rales, or rhonchi. Normal respiratory effort.  Gastrointestinal: Abdomen soft and nontender. No rebound  or guarding. There is no CVA tenderness. Back: No midline tenderness.  Musculoskeletal: Nontender with normal range of motion in all extremities.      Right lower leg: No tenderness or edema.      Left lower leg: No tenderness or edema. Neurologic: Speech clear. Alert and appropriate, no gross focal neurologic deficits are appreciated. Finger to nose intact, negative pronator. Gait steady with ambulation. Equal strength in all four extremities. Extremities neurovascularly intact.  Skin: Skin is warm, dry, and intact. No rash noted. Psychiatric: Mood and affect are normal. Speech and behavior are normal.  Labs   Labs Reviewed  COMPREHENSIVE METABOLIC PANEL  LIPASE, BLOOD  CBC WITH DIFFERENTIAL/PLATELET  I-STAT TROPONIN, ED    Radiology   DG Chest 2 View    (Results Pending)    EKG   EKG: normal sinus rhythm, rate of 83 bpm, right bundle branch block. No STEMI.   ED Course, Assessment and Plan   Pt is a 66 y/o M who presents to ED for nausea and vomiting. Abdomen soft, nontender. Doubt cholecystitis (normal hepatobiliary scan today), perforated viscous/ulcer, pancreatitis, diverticulitis, or appendicitis. Will get labs for electrolyte abnormality/anemia,  ECG to assess for dysrhythmia, WPW, Brugada, Ischemia, long QT; and CXR to assess for wide mediastinum and other cardiac abnormality. Doubt Acute Coronary Syndrome at this time. EKG with no acute ischemic changes. Doubt Pulmonary Embolism - no hemoptysis, no pleurisy, no recent surgery, no history of a hypercoagulable state, no unilateral lower extremity edema, no recent travel or immobilization. Doubt aortic dissection - no chest pain, peripheral pulse strong and equal, no aortic regurgitation murmur. Will plan to give IVF, zofran and re-eval.  4:01 PM Sign out to Jessee AversJ. Russo, PA. Pending labs, orthostatic VS, PO challenge, and ambulate. If workup unremarkable, plan to dc with symptomatic tx and primary care follow up.  The patient was  discussed with and seen by Dr. Clayborne DanaMesner who agrees with the treatment plan.  Previous chart, nursing notes, and vital signs reviewed.    Pertinent labs & imaging results that were available during my care of the patient were reviewed by me and considered in my medical decision making (see chart for details).     Merinda Victorino, Hinton DyerRobyn K, NP 11/15/16 1740    Marily MemosMesner, Jason, MD 11/15/16 [id number]

## 2016-11-11 NOTE — ED Triage Notes (Addendum)
Pt to ED A6 via stretcher from main entrance with rapid response nurse-- pt had an outpatient xray done, then drank ensure, was discharged-- went to bathroom-- vomited large amount of "yellow stuff" after-- went out of bathroom, laid on bench and felt "like I wanted to pass out" -- rapid response notified- brought to ED. Pt alert on arrival to ED, denies any further nausea. Hx of stomach ulcers "I have had 1/2 of my stomach removed"

## 2016-11-11 NOTE — ED Provider Notes (Signed)
Medical screening examination/treatment/procedure(s) were conducted as a shared visit with non-physician practitioner(s) and myself.  I personally evaluated the patient during the encounter.  66 year old male with chronic nausea vomiting has worsened over the last year. Was in the process of getting this evaluated and had a barium swallow test today using ensure which she knew that he would have a problem with. Shortly afterwards he vomited multiple times and laid down to rest because he was sleepy. No syncope. Still having vomiting prior to arrival and  housekeeper as if he needed help and was brought to the emergency room for further evaluation.  On my exam he appears well with no evidence of distress or hypovolemia. Abdomen benign. Heart rrr no m/r/g Plan will be to check labs given anti-medics make sure he is at his baseline and let him be discharged to continue following up with gastroenterology.   EKG Interpretation  Date/Time:  Friday November 11 2016 14:12:08 EDT Ventricular Rate:  83 PR Interval:    QRS Duration: 134 QT Interval:  368 QTC Calculation: 433 R Axis:   -26 Text Interpretation:  Sinus rhythm Right bundle branch block No significant change since last tracing Confirmed by Marily MemosMesner, Morene Cecilio [phone]([id number]) on 11/11/2016 2:42:02 PM          Chinara Hertzberg, Barbara CowerJason, MD 11/15/16 2312

## 2016-11-11 NOTE — ED Notes (Signed)
Pt ambulated to restroom and back to bed. Pt ambulated with a steady gait and had no complaints.  

## 2016-11-11 NOTE — ED Provider Notes (Signed)
Care assumed at shift change from Donivan Scullobyn Wojeck, NP, pending labs and orthostatics. Pt presenting with acute onset of lightheadedness and vomiting after drinking ensure (with known lactose intolerance) that began PTA after outpt HIDA scan. EKG without acute changes, trop neg, no cardiac risk factors. IVF given. Plan is to PO challenge and ambulate after results. Anticipated discharge with zofran and PCP follow up. Physical Exam  BP (!) 153/72   Pulse 66   Temp (!) 97.2 F (36.2 C) (Oral)   Resp 18   Wt 73 kg (161 lb)   SpO2 97%   BMI 27.64 kg/m   Physical Exam  Constitutional: He appears well-developed and well-nourished. No distress.  Well-appearing  HENT:  Head: Normocephalic and atraumatic.  Eyes: Conjunctivae are normal.  Cardiovascular: Normal rate.   Pulmonary/Chest: Effort normal.  Psychiatric: He has a normal mood and affect. His behavior is normal.  Nursing note and vitals reviewed.   ED Course  Procedures  On my evaluation, pt is well-appearing, not in distress. Resting comfortably in bed. Labs are unremarkable. Orthostatics normal. Pt with significant improvement in sx after fluids and zofran. Pt tolerating PO liquids and ambulating in ED without dizziness. Pt is safe for discharge with close PCP follow up.  Discussed results, findings, treatment and follow up. Patient advised of return precautions. Patient verbalized understanding and agreed with plan.        Russo, Swazil[place]Jordan N, PA-C 11/11/16 2102    Tilden Fossaees, Elizabeth, MD 11/12/16 1547

## 2016-11-11 NOTE — Discharge Instructions (Signed)
You were seen in the emergency department for nausea and vomiting. Please take zofran as prescribed as needed for nausea. Continue to stay hydrated. Please call to schedule a follow up appointment with your primary care doctor within 3 days. Return to the ER if you experience fevers, chills, unexplained weight loss, dizziness, vision or gait changes, chest pain, shortness of breath, abdominal pain, unable to tolerate food or fluids, blood in urine or stool, extremity numbness/tingling/weakness, worsening symptoms, or any additional concerns.

## 2016-11-11 NOTE — ED Notes (Signed)
Pt walkedd with no problems  Steady gait no nausea no falling dowen

## 2016-11-15 ENCOUNTER — Ambulatory Visit (INDEPENDENT_AMBULATORY_CARE_PROVIDER_SITE_OTHER): Payer: Medicare Other | Admitting: Family Medicine

## 2016-11-15 ENCOUNTER — Encounter: Payer: Self-pay | Admitting: Family Medicine

## 2016-11-15 VITALS — BP 122/68 | HR 72 | Temp 97.7°F | Resp 14 | Ht 64.0 in | Wt 158.0 lb

## 2016-11-15 DIAGNOSIS — K219 Gastro-esophageal reflux disease without esophagitis: Secondary | ICD-10-CM

## 2016-11-15 DIAGNOSIS — R110 Nausea: Secondary | ICD-10-CM

## 2016-11-15 NOTE — Patient Instructions (Signed)
F/U as needed

## 2016-11-15 NOTE — Progress Notes (Signed)
   Subjective:    Patient ID: Jorge Piaymond N Lamora Jr., male    DOB: 20-Jan-1951, 66 y.o.   MRN: [id number]  Patient presents for ER F/U   Seen in ER after having HIDA scan, was given ensure right before the test and felt very nauseaous. Right after test became severely nauseous and vomited 5 x, more bile, he tried to go to his car and could only make it to th bench where housekeeping found him and brought him into the ER . He had a few more episodes of  Vomiting, given IVF, zofran. Which helped temporarily  Dr. Elnoria HowardHung reviewed HIDA scan and it was normal. Told he was being referred to a surgeon as he has had stomach surgery in the past   No vomiting past few days, still taking carafte, and uses phenergan this works better than zofran No new concerns today No weight loss    Review Of Systems:  GEN- denies fatigue, fever, weight loss,weakness, recent illness HEENT- denies eye drainage, change in vision, nasal discharge, CVS- denies chest pain, palpitations RESP- denies SOB, cough, wheeze ABD- +N/V, change in stools, abd pain GU- denies dysuria, hematuria, dribbling, incontinence MSK- denies joint pain, muscle aches, injury Neuro- denies headache, dizziness, syncope, seizure activity       Objective:    BP 122/68   Pulse 72   Temp 97.7 F (36.5 C) (Oral)   Resp 14   Ht 5\' 4"  (1.626 m)   Wt 158 lb (71.7 kg)   SpO2 98%   BMI 27.12 kg/m  GEN- NAD, alert and oriented x3 HEENT- PERRL, EOMI, non injected sclera, pink conjunctiva, MMM, oropharynx cleary CVS- RRR, no murmur RESP-CTAB ABD-NABS,soft,NT,ND Pulses- Radial  2+        Assessment & Plan:      Problem List Items Addressed This Visit    GERD (gastroesophageal reflux disease)   Chronic nausea - Primary    Chronic N/V, has had significant work up , no cause found Continue carafate helps his GERD as well Has tried multiple other meds Recommended he proceed with seeing surgeon discussed recommended by his GI I dont have  any other ideas to offer at this time Reviewed ER notes/labs         Note: This dictation was prepared with Dragon dictation along with smaller phrase technology. Any transcriptional errors that result from this process are unintentional.

## 2016-11-17 ENCOUNTER — Encounter: Payer: Self-pay | Admitting: Family Medicine

## 2016-11-17 NOTE — Assessment & Plan Note (Signed)
Chronic N/V, has had significant work up , no cause found Continue carafate helps his GERD as well Has tried multiple other meds Recommended he proceed with seeing surgeon discussed recommended by his GI I dont have any other ideas to offer at this time Reviewed ER notes/labs

## 2016-12-14 ENCOUNTER — Other Ambulatory Visit: Payer: Medicare Other

## 2016-12-14 DIAGNOSIS — D509 Iron deficiency anemia, unspecified: Secondary | ICD-10-CM

## 2016-12-15 DIAGNOSIS — R112 Nausea with vomiting, unspecified: Secondary | ICD-10-CM | POA: Diagnosis not present

## 2016-12-15 DIAGNOSIS — Z9889 Other specified postprocedural states: Secondary | ICD-10-CM | POA: Diagnosis not present

## 2016-12-15 LAB — CBC WITH DIFFERENTIAL/PLATELET
BASOS ABS: 73 {cells}/uL (ref 0–200)
Basophils Relative: 1.3 %
Eosinophils Absolute: 123 cells/uL (ref 15–500)
Eosinophils Relative: 2.2 %
HEMATOCRIT: 40.4 % (ref 38.5–50.0)
HEMOGLOBIN: 13.2 g/dL (ref 13.2–17.1)
LYMPHS ABS: 1775 {cells}/uL (ref 850–3900)
MCH: 25 pg — ABNORMAL LOW (ref 27.0–33.0)
MCHC: 32.7 g/dL (ref 32.0–36.0)
MCV: 76.4 fL — AB (ref 80.0–100.0)
MPV: 11.1 fL (ref 7.5–12.5)
Monocytes Relative: 8.1 %
NEUTROS ABS: 3175 {cells}/uL (ref 1500–7800)
NEUTROS PCT: 56.7 %
Platelets: 198 10*3/uL (ref 140–400)
RBC: 5.29 10*6/uL (ref 4.20–5.80)
RDW: 17 % — AB (ref 11.0–15.0)
Total Lymphocyte: 31.7 %
WBC: 5.6 10*3/uL (ref 3.8–10.8)
WBCMIX: 454 {cells}/uL (ref 200–950)

## 2016-12-15 LAB — IRON, TOTAL/TOTAL IRON BINDING CAP
%SAT: 13 % (calc) — ABNORMAL LOW (ref 15–60)
IRON: 47 ug/dL — AB (ref 50–180)
TIBC: 360 mcg/dL (calc) (ref 250–425)

## 2016-12-15 LAB — FERRITIN: Ferritin: 8 ng/mL — ABNORMAL LOW (ref 20–380)

## 2016-12-16 ENCOUNTER — Telehealth: Payer: Self-pay | Admitting: Family Medicine

## 2016-12-16 NOTE — Telephone Encounter (Signed)
Pts wife voiced returning nurses call in regards to lab results.

## 2016-12-16 NOTE — Telephone Encounter (Signed)
Spoke with patient regarding lab results. 

## 2016-12-20 ENCOUNTER — Other Ambulatory Visit: Payer: Self-pay | Admitting: Surgery

## 2016-12-20 DIAGNOSIS — R109 Unspecified abdominal pain: Secondary | ICD-10-CM

## 2016-12-23 ENCOUNTER — Ambulatory Visit
Admission: RE | Admit: 2016-12-23 | Discharge: 2016-12-23 | Disposition: A | Discharge status: Home / Self Care | Payer: Medicare Other | Source: Ambulatory Visit | Attending: Surgery | Admitting: Surgery

## 2016-12-23 DIAGNOSIS — R109 Unspecified abdominal pain: Secondary | ICD-10-CM | POA: Diagnosis not present

## 2016-12-23 MED ORDER — IOPAMIDOL (ISOVUE-300) INJECTION 61%
100.0000 mL | Freq: Once | INTRAVENOUS | Status: AC | PRN
  Administered 2016-12-23: 100 mL via INTRAVENOUS

## 2017-02-07 ENCOUNTER — Ambulatory Visit (INDEPENDENT_AMBULATORY_CARE_PROVIDER_SITE_OTHER): Payer: Medicare Other

## 2017-02-07 DIAGNOSIS — Z23 Encounter for immunization: Secondary | ICD-10-CM

## 2017-02-07 NOTE — Progress Notes (Signed)
Patient was seen in office for flu vaccine. Patient received vaccine in right deltoid. Patient tolerated well  

## 2017-03-16 ENCOUNTER — Other Ambulatory Visit: Payer: Self-pay | Admitting: Family Medicine

## 2017-03-16 DIAGNOSIS — D509 Iron deficiency anemia, unspecified: Secondary | ICD-10-CM

## 2017-03-17 ENCOUNTER — Other Ambulatory Visit: Payer: Self-pay

## 2017-03-20 ENCOUNTER — Other Ambulatory Visit: Payer: Medicare Other

## 2017-03-20 DIAGNOSIS — D509 Iron deficiency anemia, unspecified: Secondary | ICD-10-CM

## 2017-03-21 LAB — CBC WITH DIFFERENTIAL/PLATELET
BASOS ABS: 88 {cells}/uL (ref 0–200)
Basophils Relative: 1.3 %
EOS PCT: 3.4 %
Eosinophils Absolute: 231 cells/uL (ref 15–500)
HCT: 43.3 % (ref 38.5–50.0)
HEMOGLOBIN: 14.3 g/dL (ref 13.2–17.1)
LYMPHS ABS: 2094 {cells}/uL (ref 850–3900)
MCH: 26.3 pg — ABNORMAL LOW (ref 27.0–33.0)
MCHC: 33 g/dL (ref 32.0–36.0)
MCV: 79.6 fL — AB (ref 80.0–100.0)
MPV: 11 fL (ref 7.5–12.5)
Monocytes Relative: 8.3 %
NEUTROS ABS: 3822 {cells}/uL (ref 1500–7800)
NEUTROS PCT: 56.2 %
Platelets: 186 10*3/uL (ref 140–400)
RBC: 5.44 10*6/uL (ref 4.20–5.80)
RDW: 15.6 % — AB (ref 11.0–15.0)
Total Lymphocyte: 30.8 %
WBC: 6.8 10*3/uL (ref 3.8–10.8)
WBCMIX: 564 {cells}/uL (ref 200–950)

## 2017-03-21 LAB — IRON,TIBC AND FERRITIN PANEL
%SAT: 23 % (ref 15–60)
FERRITIN: 29 ng/mL (ref 20–380)
Iron: 82 ug/dL (ref 50–180)
TIBC: 352 ug/dL (ref 250–425)

## 2017-06-13 ENCOUNTER — Encounter: Payer: Self-pay | Admitting: Family Medicine

## 2017-06-13 ENCOUNTER — Ambulatory Visit (INDEPENDENT_AMBULATORY_CARE_PROVIDER_SITE_OTHER): Payer: Medicare Other | Admitting: Family Medicine

## 2017-06-13 ENCOUNTER — Other Ambulatory Visit: Payer: Self-pay

## 2017-06-13 VITALS — BP 126/62 | HR 66 | Temp 98.2°F | Resp 16 | Ht 64.0 in | Wt 162.0 lb

## 2017-06-13 DIAGNOSIS — K219 Gastro-esophageal reflux disease without esophagitis: Secondary | ICD-10-CM | POA: Diagnosis not present

## 2017-06-13 DIAGNOSIS — R110 Nausea: Secondary | ICD-10-CM

## 2017-06-13 DIAGNOSIS — D509 Iron deficiency anemia, unspecified: Secondary | ICD-10-CM

## 2017-06-13 MED ORDER — PROMETHAZINE HCL 25 MG PO TABS: 25.0000 mg | ORAL_TABLET | Freq: Four times a day (QID) | ORAL | 1 refills | Status: DC | PRN

## 2017-06-13 MED ORDER — PANCRELIPASE (LIP-PROT-AMYL) 12000-38000 UNITS PO CPEP: 12000.0000 [IU] | ORAL_CAPSULE | Freq: Three times a day (TID) | ORAL | 3 refills | Status: DC

## 2017-06-13 MED ORDER — SUCRALFATE 1 G PO TABS: 1.0000 g | ORAL_TABLET | Freq: Three times a day (TID) | ORAL | 2 refills | Status: DC

## 2017-06-13 MED ORDER — ONDANSETRON HCL 4 MG PO TABS: 4.0000 mg | ORAL_TABLET | Freq: Three times a day (TID) | ORAL | 1 refills | Status: DC | PRN

## 2017-06-13 NOTE — Patient Instructions (Signed)
F/U Schedule a physical  Carafate changed to 4 times a day

## 2017-06-13 NOTE — Progress Notes (Signed)
   Subjective:    Patient ID: Jorge Piaymond N Fehrman Jr., male    DOB: July 21, 1950, 67 y.o.   MRN: [id number]  Patient presents for Chronic Nausea (is taking carafate 4-5x daily for nausea)  Chronic nausea and vomiting  In the morning .   Uses phenergan and zofran as needed. Phenergan makes him very sleepy  Has been taking a lot of carafate  Was seen by general surgeon told tear in pancreas, put on Creon as well   Needs refill on medications due to insurance   CT reviewed I dont have note    Anemia taking iron tablets twice a day, anemia improved on labs in December 2018   Review Of Systems:  GEN- denies fatigue, fever, weight loss,weakness, recent illness HEENT- denies eye drainage, change in vision, nasal discharge, CVS- denies chest pain, palpitations RESP- denies SOB, cough, wheeze ABD- + N /V, change in stools, abd pain GU- denies dysuria, hematuria, dribbling, incontinence MSK- denies joint pain, muscle aches, injury Neuro- denies headache, dizziness, syncope, seizure activity       Objective:    BP 126/62   Pulse 66   Temp 98.2 F (36.8 C) (Oral)   Resp 16   Ht 5\' 4"  (1.626 m)   Wt 162 lb (73.5 kg)   SpO2 97%   BMI 27.81 kg/m  GEN- NAD, alert and oriented x3 HEENT- PERRL, EOMI, non injected sclera, pink conjunctiva, MMM, oropharynx clear CVS- RRR, no murmur RESP-CTAB ABD-NABS,soft,NT,ND EXT- No edema Pulses- Radial 2+        Assessment & Plan:      Problem List Items Addressed This Visit      Unprioritized   GERD (gastroesophageal reflux disease)   Relevant Medications   sucralfate (CARAFATE) 1 g tablet   ondansetron (ZOFRAN) 4 MG tablet   lipase/protease/amylase (CREON) 12000 units CPEP capsule   Iron deficiency anemia    Continue iron supplements       Chronic nausea - Primary    Chronic nausea, I will obtain surgeons note about the pancreatic "tear" he mentions Carafate changed to QID zofran refilled Continue GERD medication         Note:  This dictation was prepared with Dragon dictation along with smaller phrase technology. Any transcriptional errors that result from this process are unintentional.

## 2017-06-14 ENCOUNTER — Encounter: Payer: Self-pay | Admitting: *Deleted

## 2017-06-14 ENCOUNTER — Encounter: Payer: Self-pay | Admitting: Family Medicine

## 2017-06-14 ENCOUNTER — Telehealth: Payer: Self-pay | Admitting: *Deleted

## 2017-06-14 NOTE — Assessment & Plan Note (Signed)
Continue iron supplements

## 2017-06-14 NOTE — Telephone Encounter (Signed)
Received request from pharmacy for PA on phenergan (high risk medication d/t >68yo).  PA submitted.   Dx: R11.0- chronic nausea

## 2017-06-14 NOTE — Assessment & Plan Note (Addendum)
Chronic nausea, I will obtain surgeons note about the pancreatic "tear" he mentions Carafate changed to QID zofran refilled Continue GERD medication Continue creon

## 2017-06-14 NOTE — Telephone Encounter (Signed)
Your PA has been faxed to the plan as a paper copy. Please contact the plan directly if you haven't received a determination in a typical timeframe.  You will be notified of the determination via fax. 

## 2017-06-15 MED ORDER — PROMETHAZINE HCL 25 MG PO TABS: 25.0000 mg | ORAL_TABLET | Freq: Four times a day (QID) | ORAL | 1 refills | Status: DC | PRN

## 2017-06-15 NOTE — Telephone Encounter (Signed)
Received PA determination.   PA [id number] approved 04/02/2017- 04/03/2018.  Pharmacy made aware.

## 2017-09-26 ENCOUNTER — Encounter: Payer: Medicare Other | Admitting: Family Medicine

## 2017-10-04 ENCOUNTER — Encounter: Payer: Self-pay | Admitting: Family Medicine

## 2017-10-04 ENCOUNTER — Ambulatory Visit (INDEPENDENT_AMBULATORY_CARE_PROVIDER_SITE_OTHER): Payer: Medicare Other | Admitting: Family Medicine

## 2017-10-04 ENCOUNTER — Other Ambulatory Visit: Payer: Self-pay

## 2017-10-04 VITALS — BP 128/64 | HR 62 | Temp 98.2°F | Resp 14 | Ht 64.0 in | Wt 156.0 lb

## 2017-10-04 DIAGNOSIS — D509 Iron deficiency anemia, unspecified: Secondary | ICD-10-CM | POA: Diagnosis not present

## 2017-10-04 DIAGNOSIS — Z125 Encounter for screening for malignant neoplasm of prostate: Secondary | ICD-10-CM | POA: Diagnosis not present

## 2017-10-04 DIAGNOSIS — Z23 Encounter for immunization: Secondary | ICD-10-CM

## 2017-10-04 DIAGNOSIS — R110 Nausea: Secondary | ICD-10-CM

## 2017-10-04 DIAGNOSIS — Z0000 Encounter for general adult medical examination without abnormal findings: Secondary | ICD-10-CM

## 2017-10-04 DIAGNOSIS — Z13220 Encounter for screening for lipoid disorders: Secondary | ICD-10-CM | POA: Diagnosis not present

## 2017-10-04 MED ORDER — ONDANSETRON HCL 4 MG PO TABS: 4.0000 mg | ORAL_TABLET | Freq: Three times a day (TID) | ORAL | 1 refills | Status: DC | PRN

## 2017-10-04 NOTE — Progress Notes (Signed)
Subjective:   Patient presents for Medicare Annual/Subsequent preventive examination.    Chronic nauesa unchanged- continues ton zofran, carafate,. He has had some weight loss since march 6lbs , needs refilled on zofran States he has been trying to lose weight,has changed his diet, trying to walk more   Iron def anemia- taking supplements as prescribed    Planning to have dental surgery this month, getting dentures     Seen by Eye doctor told small cataracts   Review Past Medical/Family/Social: Per EMR   Risk Factors  Current exercise habits: walks  Dietary issues discussed: Noted weight loss of 6lbs since March   Cardiac risk factors: None   Depression Screen  (Note: if answer to either of the following is "Yes", a more complete depression screening is indicated)  Over the past two weeks, have you felt down, depressed or hopeless? No Over the past two weeks, have you felt little interest or pleasure in doing things? No Have you lost interest or pleasure in daily life? No Do you often feel hopeless? No Do you cry easily over simple problems? No   Activities of Daily Living  In your present state of health, do you have any difficulty performing the following activities?:  Driving?  No Managing money? No Feeding yourself? No  Getting from bed to chair? No  Climbing a flight of stairs? No  Preparing food and eating?: No  Bathing or showering? No  Getting dressed: No  Getting to the toilet? No  Using the toilet:No  Moving around from place to place: Yes In the past year have you fallen or had a near fall?:No  Are you sexually active? No  Do you have more than one partner? No   Hearing Difficulties: No  Do you often ask people to speak up or repeat themselves? No  Do you experience ringing or noises in your ears? No Do you have difficulty understanding soft or whispered voices? No  Do you feel that you have a problem with memory? No Do you often misplace items? No  Do  you feel safe at home? Yes  Cognitive Testing  Alert? Yes Normal Appearance?Yes  Oriented to person? Yes Place? Yes  Time? Yes  Recall of three objects? Yes  Can perform simple calculations? Yes  Displays appropriate judgment?Yes  Can read the correct time from a watch face?Yes   List the Names of Other Physician/Practitioners you currently use:   [name]  [facility]- Dr. Cyndia Diver eye doctor  Screening Tests / Date Colonoscopy   Had colonoscopy last year told 10 years                   Zostavax - Declines  Pneumonia - due for 23, has had prevnar 13  Tetanus/tdap - Declines   ROS: GEN- denies fatigue, fever, weight loss,weakness, recent illness HEENT- denies eye drainage, change in vision, nasal discharge, CVS- denies chest pain, palpitations RESP- denies SOB, cough, wheeze ABD- denies N/V, change in stools, abd pain GU- denies dysuria, hematuria, dribbling, incontinence MSK- denies joint pain, muscle aches, injury Neuro- denies headache, dizziness, syncope, seizure activity  Physical:, vitals reviewed  GEN- NAD, alert and oriented x3 HEENT- PERRL, EOMI, non injected sclera, pink conjunctiva, MMM, oropharynx clear Neck- Supple, no thryomegaly, no bruit  CVS- RRR, no murmur RESP-CTAB ABD-nabs,SOFT,NT,ND EXT- No edema Pulses- Radial, DP- 2+    Assessment:    Annual wellness medicare exam   Plan:    During the course of  the visit the patient was educated and counseled about appropriate screening and preventive services including:   Pneumovax 23 given  Discussed shingles vaccine/TDAP  DECLINES  Screen Neg for depression.    Fasting labs done Advanced directives- given handout   Chronic nausea- continue current meds   Iron def anemia- recheck Hb, continue supplement    Prostate cancer screening done     Diet review for nutrition referral? Yes ____ Not Indicated __x__  Patient Instructions (the written plan) was given to the patient.  Medicare  Attestation  I have personally reviewed:  The patient's medical and social history  Their use of alcohol, tobacco or illicit drugs  Their current medications and supplements  The patient's functional ability including ADLs,fall risks, home safety risks, cognitive, and hearing and visual impairment  Diet and physical activities  Evidence for depression or mood disorders  The patient's weight, height, BMI, and visual acuity have been recorded in the chart. I have made referrals, counseling, and provided education to the patient based on review of the above and I have provided the patient with a written personalized care plan for preventive services.

## 2017-10-04 NOTE — Patient Instructions (Signed)
F/U 6 months We will call with lab results Pneumonia shot given Continue current medications

## 2017-10-05 LAB — CBC WITH DIFFERENTIAL/PLATELET
BASOS ABS: 53 {cells}/uL (ref 0–200)
Basophils Relative: 0.8 %
Eosinophils Absolute: 132 cells/uL (ref 15–500)
Eosinophils Relative: 2 %
HCT: 44.1 % (ref 38.5–50.0)
Hemoglobin: 14.4 g/dL (ref 13.2–17.1)
Lymphs Abs: 1789 cells/uL (ref 850–3900)
MCH: 26.5 pg — ABNORMAL LOW (ref 27.0–33.0)
MCHC: 32.7 g/dL (ref 32.0–36.0)
MCV: 81.1 fL (ref 80.0–100.0)
MONOS PCT: 7.5 %
MPV: 11.7 fL (ref 7.5–12.5)
NEUTROS ABS: 4132 {cells}/uL (ref 1500–7800)
NEUTROS PCT: 62.6 %
Platelets: 196 10*3/uL (ref 140–400)
RBC: 5.44 10*6/uL (ref 4.20–5.80)
RDW: 14.3 % (ref 11.0–15.0)
TOTAL LYMPHOCYTE: 27.1 %
WBC mixed population: 495 cells/uL (ref 200–950)
WBC: 6.6 10*3/uL (ref 3.8–10.8)

## 2017-10-05 LAB — COMPREHENSIVE METABOLIC PANEL
AG Ratio: 1.9 (calc) (ref 1.0–2.5)
ALT: 17 U/L (ref 9–46)
AST: 17 U/L (ref 10–35)
Albumin: 4.3 g/dL (ref 3.6–5.1)
Alkaline phosphatase (APISO): 102 U/L (ref 40–115)
BUN: 14 mg/dL (ref 7–25)
CO2: 23 mmol/L (ref 20–32)
Calcium: 9.2 mg/dL (ref 8.6–10.3)
Chloride: 106 mmol/L (ref 98–110)
Creat: 0.99 mg/dL (ref 0.70–1.25)
GLUCOSE: 80 mg/dL (ref 65–99)
Globulin: 2.3 g/dL (calc) (ref 1.9–3.7)
Potassium: 4.9 mmol/L (ref 3.5–5.3)
SODIUM: 138 mmol/L (ref 135–146)
TOTAL PROTEIN: 6.6 g/dL (ref 6.1–8.1)
Total Bilirubin: 0.6 mg/dL (ref 0.2–1.2)

## 2017-10-05 LAB — LIPID PANEL
Cholesterol: 170 mg/dL (ref <200)
HDL: 32 mg/dL — AB (ref >40)
LDL CHOLESTEROL (CALC): 114 mg/dL — AB
NON-HDL CHOLESTEROL (CALC): 138 mg/dL — AB (ref <130)
TRIGLYCERIDES: 128 mg/dL (ref <150)
Total CHOL/HDL Ratio: 5.3 (calc) — ABNORMAL HIGH (ref <5.0)

## 2017-10-05 LAB — PSA: PSA: 2.7 ng/mL (ref <=4.0)

## 2017-10-09 ENCOUNTER — Encounter: Payer: Self-pay | Admitting: *Deleted

## 2017-10-27 ENCOUNTER — Encounter: Payer: Self-pay | Admitting: *Deleted

## 2017-12-08 ENCOUNTER — Encounter: Payer: Self-pay | Admitting: *Deleted

## 2018-03-09 ENCOUNTER — Other Ambulatory Visit: Payer: Self-pay | Admitting: Family Medicine

## 2018-04-06 ENCOUNTER — Other Ambulatory Visit: Payer: Self-pay

## 2018-04-06 ENCOUNTER — Ambulatory Visit (INDEPENDENT_AMBULATORY_CARE_PROVIDER_SITE_OTHER): Payer: Medicare Other | Admitting: Family Medicine

## 2018-04-06 ENCOUNTER — Encounter: Payer: Self-pay | Admitting: Family Medicine

## 2018-04-06 VITALS — BP 128/62 | HR 62 | Temp 98.1°F | Resp 14 | Ht 64.0 in | Wt 156.0 lb

## 2018-04-06 DIAGNOSIS — F5104 Psychophysiologic insomnia: Secondary | ICD-10-CM

## 2018-04-06 DIAGNOSIS — F4321 Adjustment disorder with depressed mood: Secondary | ICD-10-CM | POA: Diagnosis not present

## 2018-04-06 DIAGNOSIS — K219 Gastro-esophageal reflux disease without esophagitis: Secondary | ICD-10-CM

## 2018-04-06 DIAGNOSIS — R110 Nausea: Secondary | ICD-10-CM | POA: Diagnosis not present

## 2018-04-06 MED ORDER — TRAZODONE HCL 50 MG PO TABS: 25.0000 mg | ORAL_TABLET | Freq: Every evening | ORAL | 3 refills | Status: DC | PRN

## 2018-04-06 NOTE — Progress Notes (Signed)
   Subjective:    Patient ID: Jorge Piaymond N Able Jr., male    DOB: 1951/02/15, 68 y.o.   MRN: [id number]  Patient presents for Follow-up (is not fasting)  Pt here to f/u chronic medical problems  He has been depressed, not sleeping as well. His grand-son who is [id number], has recurrent Cancer, he is now on hospice. PHQ-9 Score was 6 He is not sleeping well. Has good family support   Chronic nausea, abdominal pain- taking carafate and phenergan as needed   Review Of Systems:  GEN- denies fatigue, fever, weight loss,weakness, recent illness HEENT- denies eye drainage, change in vision, nasal discharge, CVS- denies chest pain, palpitations RESP- denies SOB, cough, wheeze ABD- denies N/V, change in stools, abd pain GU- denies dysuria, hematuria, dribbling, incontinence MSK- denies joint pain, muscle aches, injury Neuro- denies headache, dizziness, syncope, seizure activity       Objective:    BP 128/62   Pulse 62   Temp 98.1 F (36.7 C) (Oral)   Resp 14   Ht 5\' 4"  (1.626 m)   Wt 156 lb (70.8 kg)   SpO2 99%   BMI 26.78 kg/m  GEN- NAD, alert and oriented x3 HEENT- PERRL, EOMI, non injected sclera, pink conjunctiva, MMM, oropharynx clear Neck- Supple, no thyromegaly CVS- RRR, no murmur RESP-CTAB ABD-NABS,soft,NT,ND Psych- normal affect and mood, very pleasant  EXT- No edema Pulses- Radial  2+        Assessment & Plan:      Problem List Items Addressed This Visit      Unprioritized   Chronic nausea - Primary   GERD (gastroesophageal reflux disease)    Continue carafate and phenergan as needed       Other Visit Diagnoses    Situational depression       Trial of trazodone to help sleep and mood, start with 25mg  can increase to 50mg  after 1-2weeks   Relevant Medications   traZODone (DESYREL) 50 MG tablet   Psychophysiological insomnia          Note: This dictation was prepared with Dragon dictation along with smaller phrase technology. Any transcriptional errors  that result from this process are unintentional.

## 2018-04-06 NOTE — Patient Instructions (Addendum)
F/U 6 months for Physical  And 2 months for medications Try the trazodone, start with 1/2 tablet, then go to full tablet

## 2018-04-06 NOTE — Assessment & Plan Note (Signed)
Continue carafate and phenergan as needed

## 2018-04-07 ENCOUNTER — Encounter: Payer: Self-pay | Admitting: Family Medicine

## 2018-04-11 ENCOUNTER — Ambulatory Visit (INDEPENDENT_AMBULATORY_CARE_PROVIDER_SITE_OTHER): Payer: Medicare Other | Admitting: *Deleted

## 2018-04-11 DIAGNOSIS — Z23 Encounter for immunization: Secondary | ICD-10-CM | POA: Diagnosis not present

## 2018-04-28 ENCOUNTER — Other Ambulatory Visit: Payer: Self-pay | Admitting: Family Medicine

## 2018-06-08 ENCOUNTER — Ambulatory Visit (INDEPENDENT_AMBULATORY_CARE_PROVIDER_SITE_OTHER): Payer: Medicare Other | Admitting: Family Medicine

## 2018-06-08 ENCOUNTER — Other Ambulatory Visit: Payer: Self-pay

## 2018-06-08 ENCOUNTER — Encounter: Payer: Self-pay | Admitting: Family Medicine

## 2018-06-08 VITALS — BP 128/86 | HR 58 | Temp 97.8°F | Resp 14 | Ht 64.0 in | Wt 159.0 lb

## 2018-06-08 DIAGNOSIS — F329 Major depressive disorder, single episode, unspecified: Secondary | ICD-10-CM | POA: Insufficient documentation

## 2018-06-08 DIAGNOSIS — D509 Iron deficiency anemia, unspecified: Secondary | ICD-10-CM | POA: Diagnosis not present

## 2018-06-08 DIAGNOSIS — F5104 Psychophysiologic insomnia: Secondary | ICD-10-CM

## 2018-06-08 DIAGNOSIS — F4321 Adjustment disorder with depressed mood: Secondary | ICD-10-CM

## 2018-06-08 DIAGNOSIS — K219 Gastro-esophageal reflux disease without esophagitis: Secondary | ICD-10-CM | POA: Diagnosis not present

## 2018-06-08 DIAGNOSIS — G4700 Insomnia, unspecified: Secondary | ICD-10-CM | POA: Insufficient documentation

## 2018-06-08 MED ORDER — FERROUS SULFATE 325 (65 FE) MG PO TABS: 325.0000 mg | ORAL_TABLET | Freq: Three times a day (TID) | ORAL | 1 refills | Status: DC

## 2018-06-08 NOTE — Patient Instructions (Addendum)
F/u July for physical We will call with lab results for iron

## 2018-06-08 NOTE — Assessment & Plan Note (Signed)
Recheck his iron levels continue supplement use trying to be more consistent

## 2018-06-08 NOTE — Progress Notes (Signed)
    Subjective:    Patient ID: Jorge Pi., male    DOB: October 06, 1950, 68 y.o.   MRN: [id number]  Patient presents for Follow-up (is fasting)   Pt here to f/u medications    Chronic insomnia and depression- taking 50mg  trazodone at bedtime and doing  Much btter with mood and sleep, started in Jan.  His grandson who has cancer is doing much better his older grandson who was deployed is now in [place] but safe.   Iron def anemia- taking 1-3 times a day some days missing some   Chronic reflux nausea has been stable with his medications  Review Of Systems:  GEN- denies fatigue, fever, weight loss,weakness, recent illness HEENT- denies eye drainage, change in vision, nasal discharge, CVS- denies chest pain, palpitations RESP- denies SOB, cough, wheeze ABD- denies N/V, change in stools, abd pain GU- denies dysuria, hematuria, dribbling, incontinence MSK- denies joint pain, muscle aches, injury Neuro- denies headache, dizziness, syncope, seizure activity       Objective:    BP 128/86   Pulse (!) 58   Temp 97.8 F (36.6 C) (Oral)   Resp 14   Ht 5\' 4"  (1.626 m)   Wt 159 lb (72.1 kg)   SpO2 97%   BMI 27.29 kg/m  GEN- NAD, alert and oriented x3 HEENT- PERRL, EOMI, non injected sclera, pink conjunctiva, MMM, oropharynx clear CVS- RRR, no murmur RESP-CTAB ABD-NABS,soft,NT,ND Psych- normal affect and mood, well groomed, PHQ9 score 4 EXT- No edema Pulses- Radial, DP- 2+        Assessment & Plan:      Problem List Items Addressed This Visit      Unprioritized   GERD (gastroesophageal reflux disease) - Primary    Reflux control with Carafate he also has his antiemetics      Relevant Orders   CBC with Differential/Platelet   Iron, TIBC and Ferritin Panel   Comprehensive metabolic panel   Insomnia    His overall mood and sleep is much improved with the trazodone.  No change in the medication      Iron deficiency anemia    Recheck his iron levels continue  supplement use trying to be more consistent      Relevant Medications   ferrous sulfate 325 (65 FE) MG tablet   Other Relevant Orders   CBC with Differential/Platelet   Iron, TIBC and Ferritin Panel   Situational depression      Note: This dictation was prepared with Dragon dictation along with smaller phrase technology. Any transcriptional errors that result from this process are unintentional.

## 2018-06-08 NOTE — Assessment & Plan Note (Signed)
Reflux control with Carafate he also has his antiemetics

## 2018-06-08 NOTE — Assessment & Plan Note (Signed)
His overall mood and sleep is much improved with the trazodone.  No change in the medication

## 2018-06-09 LAB — CBC WITH DIFFERENTIAL/PLATELET
ABSOLUTE MONOCYTES: 678 {cells}/uL (ref 200–950)
BASOS ABS: 58 {cells}/uL (ref 0–200)
BASOS PCT: 0.9 %
Eosinophils Absolute: 147 cells/uL (ref 15–500)
Eosinophils Relative: 2.3 %
HEMATOCRIT: 41 % (ref 38.5–50.0)
HEMOGLOBIN: 13.7 g/dL (ref 13.2–17.1)
LYMPHS ABS: 1786 {cells}/uL (ref 850–3900)
MCH: 27.1 pg (ref 27.0–33.0)
MCHC: 33.4 g/dL (ref 32.0–36.0)
MCV: 81.2 fL (ref 80.0–100.0)
MPV: 11.9 fL (ref 7.5–12.5)
Monocytes Relative: 10.6 %
NEUTROS ABS: 3731 {cells}/uL (ref 1500–7800)
Neutrophils Relative %: 58.3 %
PLATELETS: 184 10*3/uL (ref 140–400)
RBC: 5.05 10*6/uL (ref 4.20–5.80)
RDW: 14 % (ref 11.0–15.0)
Total Lymphocyte: 27.9 %
WBC: 6.4 10*3/uL (ref 3.8–10.8)

## 2018-06-09 LAB — COMPREHENSIVE METABOLIC PANEL
AG Ratio: 2 (calc) (ref 1.0–2.5)
ALBUMIN MSPROF: 4.1 g/dL (ref 3.6–5.1)
ALKALINE PHOSPHATASE (APISO): 82 U/L (ref 35–144)
ALT: 18 U/L (ref 9–46)
AST: 18 U/L (ref 10–35)
BUN: 12 mg/dL (ref 7–25)
CO2: 24 mmol/L (ref 20–32)
Calcium: 9 mg/dL (ref 8.6–10.3)
Chloride: 106 mmol/L (ref 98–110)
Creat: 0.99 mg/dL (ref 0.70–1.25)
Globulin: 2.1 g/dL (calc) (ref 1.9–3.7)
Glucose, Bld: 79 mg/dL (ref 65–99)
POTASSIUM: 4.5 mmol/L (ref 3.5–5.3)
Sodium: 140 mmol/L (ref 135–146)
Total Bilirubin: 0.5 mg/dL (ref 0.2–1.2)
Total Protein: 6.2 g/dL (ref 6.1–8.1)

## 2018-06-09 LAB — IRON,TIBC AND FERRITIN PANEL
%SAT: 36 % (ref 20–48)
FERRITIN: 17 ng/mL — AB (ref 24–380)
IRON: 129 ug/dL (ref 50–180)
TIBC: 363 ug/dL (ref 250–425)

## 2018-06-12 ENCOUNTER — Encounter: Payer: Self-pay | Admitting: *Deleted

## 2018-06-24 ENCOUNTER — Other Ambulatory Visit: Payer: Self-pay | Admitting: Family Medicine

## 2018-08-17 DIAGNOSIS — H25093 Other age-related incipient cataract, bilateral: Secondary | ICD-10-CM | POA: Diagnosis not present

## 2018-08-17 DIAGNOSIS — H04123 Dry eye syndrome of bilateral lacrimal glands: Secondary | ICD-10-CM | POA: Diagnosis not present

## 2018-10-10 ENCOUNTER — Encounter: Payer: Medicare Other | Admitting: Family Medicine

## 2018-10-12 ENCOUNTER — Encounter: Payer: Medicare Other | Admitting: Family Medicine

## 2018-11-16 ENCOUNTER — Other Ambulatory Visit: Payer: Self-pay

## 2018-11-16 ENCOUNTER — Ambulatory Visit (INDEPENDENT_AMBULATORY_CARE_PROVIDER_SITE_OTHER): Payer: Medicare Other | Admitting: Family Medicine

## 2018-11-16 ENCOUNTER — Telehealth: Payer: Self-pay | Admitting: Family Medicine

## 2018-11-16 ENCOUNTER — Encounter: Payer: Self-pay | Admitting: Family Medicine

## 2018-11-16 VITALS — BP 122/62 | HR 70 | Temp 98.3°F | Resp 16 | Ht 64.0 in | Wt 154.0 lb

## 2018-11-16 DIAGNOSIS — Z0001 Encounter for general adult medical examination with abnormal findings: Secondary | ICD-10-CM

## 2018-11-16 DIAGNOSIS — D509 Iron deficiency anemia, unspecified: Secondary | ICD-10-CM

## 2018-11-16 DIAGNOSIS — R110 Nausea: Secondary | ICD-10-CM | POA: Diagnosis not present

## 2018-11-16 DIAGNOSIS — Z0000 Encounter for general adult medical examination without abnormal findings: Secondary | ICD-10-CM

## 2018-11-16 DIAGNOSIS — Z13220 Encounter for screening for lipoid disorders: Secondary | ICD-10-CM

## 2018-11-16 DIAGNOSIS — Z125 Encounter for screening for malignant neoplasm of prostate: Secondary | ICD-10-CM

## 2018-11-16 DIAGNOSIS — F330 Major depressive disorder, recurrent, mild: Secondary | ICD-10-CM

## 2018-11-16 MED ORDER — SHINGRIX 50 MCG/0.5ML IM SUSR: 0.5000 mL | Freq: Once | INTRAMUSCULAR | 1 refills | Status: AC

## 2018-11-16 MED ORDER — MIRTAZAPINE 7.5 MG PO TABS: 7.5000 mg | ORAL_TABLET | Freq: Every day | ORAL | 1 refills | Status: DC

## 2018-11-16 MED ORDER — BOOSTRIX 5-2.5-18.5 LF-MCG/0.5 IM SUSP: 0.5000 mL | Freq: Once | INTRAMUSCULAR | 0 refills | Status: AC

## 2018-11-16 NOTE — Telephone Encounter (Signed)
Patient is calling to say that he talked to dr Buelah Manis about shingles shot and needs further instruction

## 2018-11-16 NOTE — Patient Instructions (Addendum)
F/u 3  MONTHS  WE WILL CALL WITH LAB RESULTS  Start remeron 7.5mg  at bedtime Stop trazodone

## 2018-11-16 NOTE — Progress Notes (Signed)
Subjective:   Patient presents for Medicare Annual/Subsequent preventive examination.     Chronic nauesa unchanged- continues ton zofran, carafate, still gets GI upset in epigastrium does not want to return to GI, has had a few episodes of vomiting as well  Feels his stomach is worse when his nerves are. At night cangt sleep well even with trazodone, but then states hasnt taken recently. Feels sad at times thinking about family and the covid stuff    Iron def anemia- taking supplements as prescribed     Review Past Medical/Family/Social: Per EMR   Risk Factors  Current exercise habits: walks  Dietary issues discussed: Noted weight loss of 6lbs since March   Cardiac risk factors: None   Depression Screen  - PHQ 9 SCORE 6  (Note: if answer to either of the following is "Yes", a more complete depression screening is indicated)  Over the past two weeks, have you felt down, depressed or hopeless? Yes Over the past two weeks, have you felt little interest or pleasure in doing things? Yes Have you lost interest or pleasure in daily life? Yes Do you often feel hopeless? No Do you cry easily over simple problems? No   Activities of Daily Living  In your present state of health, do you have any difficulty performing the following activities?:  Driving?  No Managing money? No Feeding yourself? No  Getting from bed to chair? No  Climbing a flight of stairs? No  Preparing food and eating?: No  Bathing or showering? No  Getting dressed: No  Getting to the toilet? No  Using the toilet:No  Moving around from place to place: Yes In the past year have you fallen or had a near fall?:No  Are you sexually active? No  Do you have more than one partner? No   Hearing Difficulties: No  Do you often ask people to speak up or repeat themselves? No  Do you experience ringing or noises in your ears? No Do you have difficulty understanding soft or whispered voices? No  Do you feel that  you have a problem with memory? No Do you often misplace items? No  Do you feel safe at home? Yes  Cognitive Testing  Alert? Yes Normal Appearance?Yes  Oriented to person? Yes Place? Yes  Time? Yes  Recall of three objects? Yes  Can perform simple calculations? Yes  Displays appropriate judgment?Yes  Can read the correct time from a watch face?Yes   List the Names of Other Physician/Practitioners you currently use:   Dr. Benson [place]  [facility]- Dr. Malen Gauze eye doctor  Screening Tests / Date Colonoscopy   Had colonoscopy last year told 10 years                   Zostavax - Due Pneumonia - due for 23, has had prevnar 13  Tetanus/tdap - Due   ROS: GEN- denies fatigue, fever, +weight loss,weakness, recent illness HEENT- denies eye drainage, change in vision, nasal discharge, CVS- denies chest pain, palpitations RESP- denies SOB, cough, wheeze ABD- denies N/V, change in stools, abd pain GU- denies dysuria, hematuria, dribbling, incontinence MSK- denies joint pain, muscle aches, injury Neuro- denies headache, dizziness, syncope, seizure activity  Physical:, vitals reviewed  GEN- NAD, alert and oriented x3 HEENT- PERRL, EOMI, non injected sclera, pink conjunctiva, MMM, oropharynx clear Neck- Supple, no thryomegaly, no bruit  CVS- RRR, no murmur RESP-CTAB ABD-nabs,SOFT,NT,ND EXT- No edema Pulses- Radial, DP- 2+    Assessment:  Annual wellness medicare exam   Plan:    During the course of the visit the patient was educated and counseled about appropriate screening and preventive services including:    Discussed shingles vaccine/TDAP sent to pharmacy   Start remeron 7.5mg  at bedtinme for sleep and depressed mood , he is not taking trazodone regulary    Fasting labs done Advanced directives- given handout   Chronic nausea- continue current meds if he wants to see GI he will let me know    Iron def anemia- recheck Hb, continue supplement  AUDIT  C/fALLscreen negative   Prostate cancer screening done   Patient Instructions (the written plan) was given to the patient.  Medicare Attestation  I have personally reviewed:  The patient's medical and social history  Their use of alcohol, tobacco or illicit drugs  Their current medications and supplements  The patient's functional ability including ADLs,fall risks, home safety risks, cognitive, and hearing and visual impairment  Diet and physical activities  Evidence for depression or mood disorders  The patient's weight, height, BMI, and visual acuity have been recorded in the chart. I have made referrals, counseling, and provided education to the patient based on review of the above and I have provided the patient with a written personalized care plan for preventive services.

## 2018-11-17 LAB — COMPREHENSIVE METABOLIC PANEL
AG Ratio: 1.7 (calc) (ref 1.0–2.5)
ALT: 11 U/L (ref 9–46)
AST: 15 U/L (ref 10–35)
Albumin: 4.1 g/dL (ref 3.6–5.1)
Alkaline phosphatase (APISO): 74 U/L (ref 35–144)
BUN: 11 mg/dL (ref 7–25)
CO2: 20 mmol/L (ref 20–32)
Calcium: 9 mg/dL (ref 8.6–10.3)
Chloride: 111 mmol/L — ABNORMAL HIGH (ref 98–110)
Creat: 0.95 mg/dL (ref 0.70–1.25)
Globulin: 2.4 g/dL (calc) (ref 1.9–3.7)
Glucose, Bld: 80 mg/dL (ref 65–99)
Potassium: 4 mmol/L (ref 3.5–5.3)
Sodium: 141 mmol/L (ref 135–146)
Total Bilirubin: 0.6 mg/dL (ref 0.2–1.2)
Total Protein: 6.5 g/dL (ref 6.1–8.1)

## 2018-11-17 LAB — CBC WITH DIFFERENTIAL/PLATELET
Absolute Monocytes: 503 cells/uL (ref 200–950)
Basophils Absolute: 47 cells/uL (ref 0–200)
Basophils Relative: 0.7 %
Eosinophils Absolute: 121 cells/uL (ref 15–500)
Eosinophils Relative: 1.8 %
HCT: 45.6 % (ref 38.5–50.0)
Hemoglobin: 15.4 g/dL (ref 13.2–17.1)
Lymphs Abs: 1816 cells/uL (ref 850–3900)
MCH: 29 pg (ref 27.0–33.0)
MCHC: 33.8 g/dL (ref 32.0–36.0)
MCV: 85.9 fL (ref 80.0–100.0)
MPV: 12.1 fL (ref 7.5–12.5)
Monocytes Relative: 7.5 %
Neutro Abs: 4214 cells/uL (ref 1500–7800)
Neutrophils Relative %: 62.9 %
Platelets: 202 10*3/uL (ref 140–400)
RBC: 5.31 10*6/uL (ref 4.20–5.80)
RDW: 13.7 % (ref 11.0–15.0)
Total Lymphocyte: 27.1 %
WBC: 6.7 10*3/uL (ref 3.8–10.8)

## 2018-11-17 LAB — PSA: PSA: 3.1 ng/mL (ref <=4.0)

## 2018-11-17 LAB — LIPID PANEL
Cholesterol: 160 mg/dL (ref <200)
HDL: 39 mg/dL — ABNORMAL LOW (ref >=40)
LDL Cholesterol (Calc): 102 mg/dL (calc) — ABNORMAL HIGH
Non-HDL Cholesterol (Calc): 121 mg/dL (calc) (ref <130)
Total CHOL/HDL Ratio: 4.1 (calc) (ref <5.0)
Triglycerides: 92 mg/dL (ref <150)

## 2018-11-17 LAB — IRON: Iron: 112 ug/dL (ref 50–180)

## 2018-11-18 ENCOUNTER — Encounter: Payer: Self-pay | Admitting: Family Medicine

## 2018-11-19 NOTE — Telephone Encounter (Signed)
Prescription sent to pharmacy. Pharmacy will administer injection.   Call placed to patient. No answer. No VM.

## 2018-11-20 ENCOUNTER — Encounter: Payer: Self-pay | Admitting: *Deleted

## 2018-11-20 NOTE — Telephone Encounter (Signed)
Multiple calls placed to patient with no answer and no return call.   Message to be closed.   Letter sent.   

## 2018-12-08 ENCOUNTER — Other Ambulatory Visit: Payer: Self-pay | Admitting: Family Medicine

## 2019-01-20 ENCOUNTER — Other Ambulatory Visit: Payer: Self-pay | Admitting: Family Medicine

## 2019-02-15 ENCOUNTER — Other Ambulatory Visit: Payer: Self-pay

## 2019-02-18 ENCOUNTER — Other Ambulatory Visit: Payer: Self-pay

## 2019-02-18 ENCOUNTER — Encounter: Payer: Self-pay | Admitting: Family Medicine

## 2019-02-18 ENCOUNTER — Ambulatory Visit (INDEPENDENT_AMBULATORY_CARE_PROVIDER_SITE_OTHER): Payer: Medicare Other | Admitting: Family Medicine

## 2019-02-18 VITALS — BP 130/72 | HR 70 | Temp 97.9°F | Resp 16 | Ht 64.0 in | Wt 154.0 lb

## 2019-02-18 DIAGNOSIS — R110 Nausea: Secondary | ICD-10-CM | POA: Diagnosis not present

## 2019-02-18 DIAGNOSIS — N529 Male erectile dysfunction, unspecified: Secondary | ICD-10-CM | POA: Diagnosis not present

## 2019-02-18 DIAGNOSIS — Z23 Encounter for immunization: Secondary | ICD-10-CM | POA: Diagnosis not present

## 2019-02-18 DIAGNOSIS — F330 Major depressive disorder, recurrent, mild: Secondary | ICD-10-CM

## 2019-02-18 DIAGNOSIS — K219 Gastro-esophageal reflux disease without esophagitis: Secondary | ICD-10-CM | POA: Diagnosis not present

## 2019-02-18 DIAGNOSIS — F5104 Psychophysiologic insomnia: Secondary | ICD-10-CM | POA: Diagnosis not present

## 2019-02-18 MED ORDER — SILDENAFIL CITRATE 100 MG PO TABS: 50.0000 mg | ORAL_TABLET | Freq: Every day | ORAL | 3 refills | Status: DC | PRN

## 2019-02-18 NOTE — Assessment & Plan Note (Signed)
Continue carafate, nausea meds as needed

## 2019-02-18 NOTE — Progress Notes (Signed)
   Subjective:    Patient ID: Jorge Ponto., male    DOB: [birth year], 68 y.o.   MRN: [id number]  Patient presents for Follow-up (is fasting)   Pt here to f/u chronic medical problems    His grandson passed away from childhood cancer 3 weeks, ago, he is taking trazodone only he never started the Remeron.  States that he does not want to change at this point.  He has maintained his weight the past 6 months  He has chronic Nausea - continues with carafate/ zofren,  Iron def anemia- taking ferrous sulfate his recent labs show that his iron levels were at goal along with his hemoglobin   Due for flu shot  He had normal PSA.  But he has problems with his erections.  He would like to try Viagra  Review Of Systems:  GEN- denies fatigue, fever, weight loss,weakness, recent illness HEENT- denies eye drainage, change in vision, nasal discharge, CVS- denies chest pain, palpitations RESP- denies SOB, cough, wheeze ABD- denies N/V, change in stools, abd pain GU- denies dysuria, hematuria, dribbling, incontinence MSK- denies joint pain, muscle aches, injury Neuro- denies headache, dizziness, syncope, seizure activity       Objective:    BP 130/72   Pulse 70   Temp 97.9 F (36.6 C) (Temporal)   Resp 16   Ht 5\' 4"  (1.626 m)   Wt 154 lb (69.9 kg)   SpO2 97%   BMI 26.43 kg/m  GEN- NAD, alert and oriented x3 HEENT- PERRL, EOMI, non injected sclera, pink conjunctiva, MMM, oropharynx clear CVS- RRR, no murmur RESP-CTAB ABD-NABS,soft,NT,ND Psych-normal affect and mood EXT- No edema Pulses- Radial  2+        Assessment & Plan:      Problem List Items Addressed This Visit      Unprioritized   Chronic nausea   ED (erectile dysfunction)    Trial of viagra 50mg  1 hour before intercourse       GERD (gastroesophageal reflux disease)    Continue carafate, nausea meds as needed       Insomnia - Primary   MDD (major depressive disorder)    Continue trazodone, uses prn  sleep as well  He is maintaining his weight Has support from family members      Relevant Medications   TRAZODONE HCL PO    Other Visit Diagnoses    Need for immunization against influenza       Relevant Orders   Flu Vaccine QUAD High Dose(Fluad) (Completed)      Note: This dictation was prepared with Dragon dictation along with smaller phrase technology. Any transcriptional errors that result from this process are unintentional.

## 2019-02-18 NOTE — Assessment & Plan Note (Signed)
Trial of viagra 50mg  1 hour before intercourse

## 2019-02-18 NOTE — Patient Instructions (Signed)
F/U 6 months

## 2019-02-18 NOTE — Assessment & Plan Note (Signed)
Continue trazodone, uses prn sleep as well  He is maintaining his weight Has support from family members

## 2019-03-18 ENCOUNTER — Ambulatory Visit (INDEPENDENT_AMBULATORY_CARE_PROVIDER_SITE_OTHER): Payer: Medicare Other | Admitting: Family Medicine

## 2019-03-18 ENCOUNTER — Other Ambulatory Visit: Payer: Self-pay

## 2019-03-18 ENCOUNTER — Encounter: Payer: Self-pay | Admitting: Family Medicine

## 2019-03-18 VITALS — BP 128/68 | HR 78 | Temp 98.0°F | Resp 14 | Ht 64.0 in | Wt 158.0 lb

## 2019-03-18 DIAGNOSIS — I739 Peripheral vascular disease, unspecified: Secondary | ICD-10-CM | POA: Diagnosis not present

## 2019-03-18 NOTE — Patient Instructions (Signed)
F/u as previous Referral to vascular

## 2019-03-18 NOTE — Progress Notes (Signed)
   Subjective:    Patient ID: Jorge Ponto., male    DOB: Jan 13, 1951, 68 y.o.   MRN: [id number]  Patient presents for PVD (legs cold and vasculartesting severe)   Pt here to f/u vascular screening.  He was seen by his insurance agent.  They did a [employer] flow his right foot was 0.55 left foot 0.24.  He denies any claudication symptoms but states that his toes do get cold.  He does have family history of vascular disease.  Lipid panel was performed back in August  Lipid Panel     Component Value Date/Time   CHOL 160 11/16/2018 1022   TRIG 92 11/16/2018 1022   HDL 39 (L) 11/16/2018 1022   CHOLHDL 4.1 11/16/2018 1022   VLDL 22 12/08/2015 0905   LDLCALC 102 (H) 11/16/2018 1022     Review Of Systems:  GEN- denies fatigue, fever, weight loss,weakness, recent illness HEENT- denies eye drainage, change in vision, nasal discharge, CVS- denies chest pain, palpitations RESP- denies SOB, cough, wheeze ABD- denies N/V, change in stools, abd pain GU- denies dysuria, hematuria, dribbling, incontinence MSK- denies joint pain, muscle aches, injury Neuro- denies headache, dizziness, syncope, seizure activity       Objective:    BP 128/68   Pulse 78   Temp 98 F (36.7 C) (Temporal)   Resp 14   Ht 5\' 4"  (1.626 m)   Wt 158 lb (71.7 kg)   SpO2 99%   BMI 27.12 kg/m  GEN- NAD, alert and oriented x3 CVS- RRR, no murmur RESP-CTAB EXT- No edema Pulses- Radial, DP- palpated         Assessment & Plan:      Problem List Items Addressed This Visit    None    Visit Diagnoses    Asymptomatic PVD (peripheral vascular disease) ([place])    -  Primary   no true claudication symptoms, obtain formal vascular evaluation, if he does have blockage, needs to start Statin drug, discussed with patient    Relevant Orders   Ambulatory referral to Vascular Surgery      Note: This dictation was prepared with Dragon dictation along with smaller phrase technology. Any transcriptional errors that  result from this process are unintentional.

## 2019-04-12 ENCOUNTER — Other Ambulatory Visit: Payer: Self-pay | Admitting: Family Medicine

## 2019-05-01 ENCOUNTER — Other Ambulatory Visit: Payer: Self-pay

## 2019-05-01 ENCOUNTER — Telehealth (HOSPITAL_COMMUNITY): Payer: Self-pay

## 2019-05-01 DIAGNOSIS — I739 Peripheral vascular disease, unspecified: Secondary | ICD-10-CM

## 2019-05-01 NOTE — Telephone Encounter (Signed)
The above patient or their representative was contacted and gave the following answers to these questions:         Do you have any of the following symptoms?    NO  Fever                    Cough                   Shortness of breath  Do  you have any of the following other symptoms?    muscle pain         vomiting,        diarrhea        rash         weakness        red eye        abdominal pain         bruising          bruising or bleeding              joint pain           severe headache    Have you been in contact with someone who was or has been sick in the past 2 weeks?  NO  Yes                 Unsure                         Unable to assess   Does the person that you were in contact with have any of the following symptoms?   Cough         shortness of breath           muscle pain         vomiting,            diarrhea            rash            weakness           fever            red eye           abdominal pain           bruising  or  bleeding                joint pain                severe headache                 COMMENTS OR ACTION PLAN FOR THIS PATIENT:     ALL QUESTIONS WERE ANSWERED BY WIFE/[facility] 

## 2019-05-02 ENCOUNTER — Ambulatory Visit: Payer: Medicare Other | Admitting: Vascular Surgery

## 2019-05-02 ENCOUNTER — Ambulatory Visit (HOSPITAL_COMMUNITY)
Admission: RE | Admit: 2019-05-02 | Discharge: 2019-05-02 | Disposition: A | Discharge status: Home / Self Care | Payer: Medicare Other | Source: Ambulatory Visit | Attending: Vascular Surgery | Admitting: Vascular Surgery

## 2019-05-02 ENCOUNTER — Other Ambulatory Visit: Payer: Self-pay

## 2019-05-02 ENCOUNTER — Encounter: Payer: Self-pay | Admitting: Vascular Surgery

## 2019-05-02 VITALS — BP 138/81 | HR 68 | Temp 98.2°F | Resp 20 | Ht 64.0 in | Wt 158.0 lb

## 2019-05-02 DIAGNOSIS — R209 Unspecified disturbances of skin sensation: Secondary | ICD-10-CM

## 2019-05-02 DIAGNOSIS — I739 Peripheral vascular disease, unspecified: Secondary | ICD-10-CM

## 2019-05-02 NOTE — Progress Notes (Signed)
Referring Physician: Dr. Jeanice Lim  Patient name: Jorge Evans. MRN: [id number] DOB: 1950-10-08 Sex: male  REASON FOR CONSULT: Abnormal leg temperature on medication screening rule out peripheral arterial disease  HPI: Smiley Birr. is a 69 y.o. male, who on a recent Medicaid screening was noted to have cooler temperature in his legs below the knee bilaterally.  He was sent for further evaluation.  He has no symptoms of claudication.  He has no rest pain.  He has no nonhealing wounds.  He has no history of diabetes.  He has a remote history of smoking.    Past Medical History:  Diagnosis Date  . Anemia   . GERD (gastroesophageal reflux disease)    Past Surgical History:  Procedure Laterality Date  . LUMBAR FUSION    . STOMACH SURGERY      Family History  Problem Relation Age of Onset  . Heart attack Mother   . Emphysema Father   . Cancer Father        lung    SOCIAL HISTORY: Social History   Socioeconomic History  . Marital status: Married    Spouse name: [place]  . Number of children: 1  . Years of education: 12 +  . Highest education level: Not on file  Occupational History  . Occupation: Retired  Tobacco Use  . Smoking status: Former Games developer  . Smokeless tobacco: Never Used  Substance and Sexual Activity  . Alcohol use: No  . Drug use: No  . Sexual activity: Not on file  Other Topics Concern  . Not on file  Social History Narrative   Lives w/ wife   Caffeine use: 2 cups coffee per week   Social Determinants of Health   Financial Resource Strain:   . Difficulty of Paying Living Expenses: Not on file  Food Insecurity:   . Worried About Programme researcher, broadcasting/film/video in the Last Year: Not on file  . Ran Out of Food in the Last Year: Not on file  Transportation Needs:   . Lack of Transportation (Medical): Not on file  . Lack of Transportation (Non-Medical): Not on file  Physical Activity:   . Days of Exercise per Week: Not on file  . Minutes of Exercise  per Session: Not on file  Stress:   . Feeling of Stress : Not on file  Social Connections:   . Frequency of Communication with Friends and Family: Not on file  . Frequency of Social Gatherings with Friends and Family: Not on file  . Attends Religious Services: Not on file  . Active Member of Clubs or Organizations: Not on file  . Attends Banker Meetings: Not on file  . Marital Status: Not on file  Intimate Partner Violence:   . Fear of Current or Ex-Partner: Not on file  . Emotionally Abused: Not on file  . Physically Abused: Not on file  . Sexually Abused: Not on file    Allergies  Allergen Reactions  . Demerol [Meperidine] Rash    Current Outpatient Medications  Medication Sig Dispense Refill  . ferrous sulfate 325 (65 FE) MG tablet TAKE 1 TABLET BY MOUTH THREE TIMES A DAY WITH MEALS 270 tablet 0  . ondansetron (ZOFRAN) 4 MG tablet TAKE 1 TABLET BY MOUTH EVERY 8 HOURS AS NEEDED FOR NAUSEA AND VOMITING 30 tablet 1  . promethazine (PHENERGAN) 25 MG tablet Take 25 mg by mouth every 6 (six) hours as needed for nausea or vomiting.    Marland Kitchen  sildenafil (VIAGRA) 100 MG tablet Take 0.5-1 tablets (50-100 mg total) by mouth daily as needed for erectile dysfunction. 5 tablet 3  . sucralfate (CARAFATE) 1 g tablet TAKE 1 TABLET (1 G TOTAL) BY MOUTH 4 (FOUR) TIMES DAILY - WITH MEALS AND AT BEDTIME. 360 tablet 2  . TRAZODONE HCL PO Take by mouth.     No current facility-administered medications for this visit.    ROS:   General:  No weight loss, Fever, chills  HEENT: No recent headaches, no nasal bleeding, no visual changes, no sore throat  Neurologic: No dizziness, blackouts, seizures. No recent symptoms of stroke or mini- stroke. No recent episodes of slurred speech, or temporary blindness.  Cardiac: No recent episodes of chest pain/pressure, no shortness of breath at rest.  No shortness of breath with exertion.  Denies history of atrial fibrillation or irregular  heartbeat  Vascular: No history of rest pain in feet.  No history of claudication.  No history of non-healing ulcer, No history of DVT   Pulmonary: No home oxygen, no productive cough, no hemoptysis,  No asthma or wheezing  Musculoskeletal:  [ ]  Arthritis, [ ]  Low back pain,  [ ]  Joint pain  Hematologic:No history of hypercoagulable state.  No history of easy bleeding.  No history of anemia  Gastrointestinal: No hematochezia or melena,  No gastroesophageal reflux, no trouble swallowing  Urinary: [ ]  chronic Kidney disease, [ ]  on HD - [ ]  MWF or [ ]  TTHS, [ ]  Burning with urination, [ ]  Frequent urination, [ ]  Difficulty urinating;   Skin: No rashes  Psychological: No history of anxiety,  No history of depression   Physical Examination  Vitals:   05/02/19 0827  BP: 138/81  Pulse: 68  Resp: 20  Temp: 98.2 F (36.8 C)  SpO2: 97%  Weight: 158 lb (71.7 kg)  Height: 5\' 4"  (1.626 m)    Body mass index is 27.12 kg/m.  General:  Alert and oriented, no acute distress HEENT: Normal Neck: No JVD Cardiac: Regular Rate and Rhythm  Abdomen: Soft, non-tender, non-distended, no mass, well-healed vertical abdominal scar Skin: No rash Extremity Pulses:  2+ radial, brachial, femoral, 2+ left dorsalis pedis absent left PT, 2+ right posterior tibial pulse absent left DP, 2+ popliteal pulses bilaterally Musculoskeletal: No deformity or edema  Neurologic: Upper and lower extremity motor 5/5 and symmetric  DATA:  Patient had bilateral ABIs performed today which were greater than 1 triphasic and normal bilaterally  ASSESSMENT: Patient with occasional cool temperature in his legs.  I reassured the patient today that this is probably just normal variant.  He currently does not have any signs or symptoms of peripheral arterial disease with no claudication or wound issues.  He has a nearly normal pulse exam.  He also had normal ABIs.   PLAN: Patient will follow-up with [place] on an as-needed  basis.   Ruta Hinds, MD [facility] Office: [phone] Pager: [phone]

## 2019-07-07 ENCOUNTER — Encounter (HOSPITAL_COMMUNITY): Payer: Self-pay

## 2019-07-07 ENCOUNTER — Other Ambulatory Visit: Payer: Self-pay

## 2019-07-07 ENCOUNTER — Emergency Department (HOSPITAL_COMMUNITY): Payer: Medicare Other

## 2019-07-07 ENCOUNTER — Emergency Department (HOSPITAL_COMMUNITY)
Admission: EM | Admit: 2019-07-07 | Discharge: 2019-07-07 | Disposition: A | Discharge status: Home / Self Care | Payer: Medicare Other | Attending: Emergency Medicine | Admitting: Emergency Medicine

## 2019-07-07 DIAGNOSIS — S60221A Contusion of right hand, initial encounter: Secondary | ICD-10-CM | POA: Diagnosis not present

## 2019-07-07 DIAGNOSIS — S2232XA Fracture of one rib, left side, initial encounter for closed fracture: Secondary | ICD-10-CM | POA: Diagnosis not present

## 2019-07-07 DIAGNOSIS — S8992XA Unspecified injury of left lower leg, initial encounter: Secondary | ICD-10-CM | POA: Diagnosis not present

## 2019-07-07 DIAGNOSIS — M25519 Pain in unspecified shoulder: Secondary | ICD-10-CM | POA: Diagnosis not present

## 2019-07-07 DIAGNOSIS — Z743 Need for continuous supervision: Secondary | ICD-10-CM | POA: Diagnosis not present

## 2019-07-07 DIAGNOSIS — M25562 Pain in left knee: Secondary | ICD-10-CM | POA: Diagnosis not present

## 2019-07-07 DIAGNOSIS — Y999 Unspecified external cause status: Secondary | ICD-10-CM | POA: Insufficient documentation

## 2019-07-07 DIAGNOSIS — S4992XA Unspecified injury of left shoulder and upper arm, initial encounter: Secondary | ICD-10-CM | POA: Diagnosis not present

## 2019-07-07 DIAGNOSIS — R42 Dizziness and giddiness: Secondary | ICD-10-CM | POA: Diagnosis not present

## 2019-07-07 DIAGNOSIS — Z87891 Personal history of nicotine dependence: Secondary | ICD-10-CM | POA: Diagnosis not present

## 2019-07-07 DIAGNOSIS — Y939 Activity, unspecified: Secondary | ICD-10-CM | POA: Insufficient documentation

## 2019-07-07 DIAGNOSIS — Z79899 Other long term (current) drug therapy: Secondary | ICD-10-CM | POA: Insufficient documentation

## 2019-07-07 DIAGNOSIS — S61411A Laceration without foreign body of right hand, initial encounter: Secondary | ICD-10-CM | POA: Diagnosis not present

## 2019-07-07 DIAGNOSIS — Y929 Unspecified place or not applicable: Secondary | ICD-10-CM | POA: Insufficient documentation

## 2019-07-07 DIAGNOSIS — T07XXXA Unspecified multiple injuries, initial encounter: Secondary | ICD-10-CM

## 2019-07-07 DIAGNOSIS — R58 Hemorrhage, not elsewhere classified: Secondary | ICD-10-CM | POA: Diagnosis not present

## 2019-07-07 DIAGNOSIS — W109XXA Fall (on) (from) unspecified stairs and steps, initial encounter: Secondary | ICD-10-CM | POA: Insufficient documentation

## 2019-07-07 DIAGNOSIS — S60222A Contusion of left hand, initial encounter: Secondary | ICD-10-CM | POA: Diagnosis not present

## 2019-07-07 DIAGNOSIS — S6991XA Unspecified injury of right wrist, hand and finger(s), initial encounter: Secondary | ICD-10-CM | POA: Diagnosis not present

## 2019-07-07 DIAGNOSIS — W19XXXA Unspecified fall, initial encounter: Secondary | ICD-10-CM

## 2019-07-07 DIAGNOSIS — S99912A Unspecified injury of left ankle, initial encounter: Secondary | ICD-10-CM | POA: Diagnosis not present

## 2019-07-07 DIAGNOSIS — M25512 Pain in left shoulder: Secondary | ICD-10-CM | POA: Diagnosis not present

## 2019-07-07 DIAGNOSIS — S59902A Unspecified injury of left elbow, initial encounter: Secondary | ICD-10-CM | POA: Diagnosis not present

## 2019-07-07 MED ORDER — ONDANSETRON 8 MG PO TBDP
8.0000 mg | ORAL_TABLET | Freq: Once | ORAL | Status: AC
  Administered 2019-07-07: 8 mg via ORAL
  Filled 2019-07-07: qty 1

## 2019-07-07 MED ORDER — OXYCODONE-ACETAMINOPHEN 5-325 MG PO TABS
1.0000 | ORAL_TABLET | Freq: Once | ORAL | Status: AC
  Administered 2019-07-07: 1 via ORAL
  Filled 2019-07-07: qty 1

## 2019-07-07 MED ORDER — OXYCODONE-ACETAMINOPHEN 5-325 MG PO TABS: 1.0000 | ORAL_TABLET | Freq: Four times a day (QID) | ORAL | 0 refills | Status: DC | PRN

## 2019-07-07 NOTE — ED Notes (Signed)
Skin/wounds on R hand cleaned and dressed per md recommendation.  Pt ambulatory with steady gait, pt reports decreased pain

## 2019-07-07 NOTE — ED Triage Notes (Signed)
Pt to er via EMS, pt states that he was at his brothers funeral, pt states that he was at the top of the steps and backed up and fell down the stairs, pt states that he fell down about 6 stairs, pt denies loc, pt c/o L shoulder, L elbow and ankle pain, pt answering questions appropriately.  Pt states that he had some nausea, denies nausea at this time

## 2019-07-07 NOTE — Discharge Instructions (Addendum)
You were seen in the emergency department for evaluation of injuries after a fall.  You had x-rays of your left shoulder and elbow right hand left knee and ankle but did not show any obvious fractures.  Your chest x-ray did show a possible rib fracture.  You also had some skin tears and cuts on your hands.  Please use soap and water and can apply ice to the affected areas.  We are prescribing a short course of some pain medicine to use as needed.  Please follow-up with your doctor and return to the emergency department if any worsening or concerning symptoms

## 2019-07-07 NOTE — ED Provider Notes (Signed)
[facility] [address] EMERGENCY DEPARTMENT Provider Note   CSN: [id number] Arrival date & time: 07/07/19  1859     History Chief Complaint  Patient presents with  . Fall    Jorge Evans. is a 69 y.o. male.  He is presenting by ambulance for evaluation of injuries from a fall.  He was at his brother's funeral at the top of a flight of stairs and accidentally backed up and fell down the stairs.  Approximately 6 steps.  Denies any loss of consciousness.  Complaining of pain to left shoulder left elbow left knee left ankle and right hand.  He was able to sit up after the fall but states he felt a little nauseous.  That is resolved.  Not on anticoagulation.  Denies striking his head and has no neck or back pain.  The history is provided by the patient and the EMS personnel.  Fall This is a new problem. The current episode started less than 1 hour ago. The problem occurs rarely. The problem has not changed since onset.Pertinent negatives include no chest pain, no abdominal pain, no headaches and no shortness of breath. The symptoms are aggravated by bending and twisting. The symptoms are relieved by position. He has tried nothing for the symptoms. The treatment provided no relief.       Past Medical History:  Diagnosis Date  . Anemia   . GERD (gastroesophageal reflux disease)     Patient Active Problem List   Diagnosis Date Noted  . ED (erectile dysfunction) 02/18/2019  . Insomnia 06/08/2018  . MDD (major depressive disorder) 06/08/2018  . Chronic nausea 09/12/2016  . Hypoglycemia 09/12/2016  . Iron deficiency anemia 07/11/2016  . Dehydration 12/06/2015  . Vertigo 11/10/2015  . GERD (gastroesophageal reflux disease) 12/01/2014  . Idiopathic scoliosis 12/01/2014    Past Surgical History:  Procedure Laterality Date  . LUMBAR FUSION    . STOMACH SURGERY         Family History  Problem Relation Age of Onset  . Heart attack Mother   . Emphysema Father   . Cancer Father    lung    Social History   Tobacco Use  . Smoking status: Former Research scientist (life sciences)  . Smokeless tobacco: Never Used  Substance Use Topics  . Alcohol use: No  . Drug use: No    Home Medications Prior to Admission medications   Medication Sig Start Date End Date Taking? Authorizing Provider  ferrous sulfate 325 (65 FE) MG tablet TAKE 1 TABLET BY MOUTH THREE TIMES A DAY WITH MEALS 06/25/18   [place], Modena Nunnery, MD  ondansetron (ZOFRAN) 4 MG tablet TAKE 1 TABLET BY MOUTH EVERY 8 HOURS AS NEEDED FOR NAUSEA AND VOMITING 01/21/19   [place], Modena Nunnery, MD  promethazine (PHENERGAN) 25 MG tablet Take 25 mg by mouth every 6 (six) hours as needed for nausea or vomiting.    [provider]  sildenafil (VIAGRA) 100 MG tablet Take 0.5-1 tablets (50-100 mg total) by mouth daily as needed for erectile dysfunction. 02/18/19   [place], Modena Nunnery, MD  sucralfate (CARAFATE) 1 g tablet TAKE 1 TABLET (1 G TOTAL) BY MOUTH 4 (FOUR) TIMES DAILY - WITH MEALS AND AT BEDTIME. 04/12/19   [place], Modena Nunnery, MD  TRAZODONE HCL PO Take by mouth.    [provider]    Allergies    Demerol [meperidine]  Review of Systems   Review of Systems  Constitutional: Negative for fever.  HENT: Negative for sore throat.  Eyes: Negative for visual disturbance.  Respiratory: Negative for shortness of breath.   Cardiovascular: Negative for chest pain.  Gastrointestinal: Negative for abdominal pain.  Genitourinary: Negative for dysuria.  Musculoskeletal: Negative for back pain and neck pain.  Skin: Positive for wound. Negative for rash.  Neurological: Negative for headaches.    Physical Exam Updated Vital Signs BP 135/79   Pulse 83   Temp 98.3 F (36.8 C) (Oral)   Resp 16   Ht 5\' 5"  (1.651 m)   Wt 79.4 kg   SpO2 98%   BMI 29.12 kg/m   Physical Exam Vitals and nursing note reviewed.  Constitutional:      Appearance: Normal appearance. He is well-developed.  HENT:     Head: Normocephalic and atraumatic.    Eyes:     Conjunctiva/sclera: Conjunctivae normal.  Cardiovascular:     Rate and Rhythm: Normal rate and regular rhythm.     Heart sounds: No murmur.  Pulmonary:     Effort: Pulmonary effort is normal. No respiratory distress.     Breath sounds: Normal breath sounds.  Abdominal:     Palpations: Abdomen is soft.     Tenderness: There is no abdominal tenderness.  Musculoskeletal:        General: Tenderness and signs of injury present. Normal range of motion.     Cervical back: Neck supple. No tenderness.     Comments: Right upper extremity full range of motion to shoulder elbow wrist hand.  Has some superficial abrasions over his hand.  Left upper extremity full range of motion of shoulder elbow wrist and hand although some soreness with movement of the shoulder and elbow.  Right lower extremity full range of motion without any pain or limitations.  Left lower extremity full range of motion although some pain on the left knee and ankle.  Cervical thoracic and lumbar spine nontender.  Distal pulses intact.  Skin:    General: Skin is warm and dry.     Capillary Refill: Capillary refill takes less than 2 seconds.  Neurological:     General: No focal deficit present.     Mental Status: He is alert and oriented to person, place, and time.     Sensory: No sensory deficit.     Motor: No weakness.     ED Results / Procedures / Treatments   Labs (all labs ordered are listed, but only abnormal results are displayed) Labs Reviewed - No data to display  EKG None  Radiology DG Chest 2 View  Result Date: 07/07/2019 CLINICAL DATA:  Pain, fall. Fall down stairs at brother funeral. EXAM: CHEST - 2 VIEW COMPARISON:  11/11/2016 FINDINGS: Low lung volumes.The cardiomediastinal contours are normal. Surgical clips at the gastroesophageal junction. Pulmonary vasculature is normal. No consolidation, pleural effusion, or pneumothorax. Age-indeterminate left lateral third rib fracture on shoulder  radiograph is not well visualized on the current exam. Scoliosis of the lower thoracic and lumbar spine. IMPRESSION: 1. Left lateral third rib fracture that is age indeterminate on concurrent left shoulder exam is not visualized. 2. No other evidence of acute traumatic injury. Electronically Signed   By: 01/11/2017 M.D.   On: 07/07/2019 20:43   DG Elbow Complete Left  Result Date: 07/07/2019 CLINICAL DATA:  Pain, fall. Fall down stairs at brother funeral. EXAM: LEFT ELBOW - COMPLETE 3+ VIEW COMPARISON:  None. FINDINGS: There is no evidence of fracture, dislocation, or joint effusion. Small well corticated densities adjacent to the lateral humeral epicondyle are  chronic and likely enthesopathy. There is no evidence of arthropathy or other focal bone abnormality. Soft tissues are unremarkable. IMPRESSION: No fracture or dislocation of the left elbow. Electronically Signed   By: Narda Rutherford M.D.   On: 07/07/2019 20:44   DG Ankle Complete Left  Result Date: 07/07/2019 CLINICAL DATA:  Pain, fall. Fall down stairs at brother funeral. EXAM: LEFT ANKLE COMPLETE - 3+ VIEW COMPARISON:  None. FINDINGS: There is no evidence of fracture, dislocation, or joint effusion. There is no evidence of arthropathy or other focal bone abnormality. Soft tissues are unremarkable. IMPRESSION: Negative radiographs of the left ankle. Electronically Signed   By: Narda Rutherford M.D.   On: 07/07/2019 20:45   DG Shoulder Left  Result Date: 07/07/2019 CLINICAL DATA:  Pain, fall. Fall down stairs at brother funeral. EXAM: LEFT SHOULDER - 2+ VIEW COMPARISON:  None. FINDINGS: There is no evidence of left shoulder fracture or dislocation. Acromioclavicular degenerative change with inferiorly directed spurring. Soft tissues are unremarkable. Left lateral third rib fracture is age indeterminate. IMPRESSION: 1. No acute fracture or dislocation of the left shoulder. 2. Age indeterminate left lateral third rib fracture. Electronically  Signed   By: Narda Rutherford M.D.   On: 07/07/2019 20:44   DG Knee Complete 4 Views Left  Result Date: 07/07/2019 CLINICAL DATA:  Pain, fall. Fall down stairs at brother funeral. EXAM: LEFT KNEE - COMPLETE 4+ VIEW COMPARISON:  None. FINDINGS: No evidence of fracture or dislocation. Trace inferior patellar spurring and joint effusion. Joint spaces are preserved. Soft tissues are unremarkable. IMPRESSION: No fracture or dislocation of the left knee. Electronically Signed   By: Narda Rutherford M.D.   On: 07/07/2019 20:47   DG Hand Complete Right  Result Date: 07/07/2019 CLINICAL DATA:  Pain, fall. Fall down stairs at brother funeral. EXAM: RIGHT HAND - COMPLETE 3+ VIEW COMPARISON:  None. FINDINGS: There is no evidence of fracture or dislocation. Mild osteoarthritis of the second metacarpal phalangeal joint and distal radioulnar joint. Soft tissues are unremarkable. IMPRESSION: No fracture or dislocation of the right hand. Electronically Signed   By: Narda Rutherford M.D.   On: 07/07/2019 20:50    Procedures Procedures (including critical care time)  Medications Ordered in ED Medications - No data to display  ED Course  I have reviewed the triage vital signs and the nursing notes.  Pertinent labs & imaging results that were available during my care of the patient were reviewed by me and considered in my medical decision making (see chart for details).  Clinical Course as of Jul 08 919  Wynelle Link Jul 07, 2019  1610 Last tetanus shot he says was about a year ago.   [MB]  2203 Patient tried ambulating he still feeling very sore.  Will order a second Percocet.   [MB]  2248 Patient was able to safely ambulate here after second dose of some pain medicine.  He is comfortable with discharge and his wife is here to take him home.   [MB]    Clinical Course User Index [MB] Terrilee Files, MD   MDM Rules/Calculators/A&P                     69 year old male here with mechanical fall.  He is not on  anticoagulation and there was no head strike no loss consciousness so did not pursue CNS imaging.  Remains awake and alert in the department.  Additional history from wife who is here now.  She said  he tripped and fell down the stairs accidentally.  She said he is at his baseline mental status.  X-rays were ordered of various areas where he has pain.  His x-rays were independently reviewed by me and do not show any obvious fractures.  Radiology interpreted all the images same although they do comment upon a possible rib fracture left lateral third of indeterminate age.  Patient had some discomfort in department and ordered him some pain medicine with improvement in his symptoms.  He said he has been on pain medicine before so I will prescribe him a short course of some medication to help him with pain from his injuries.  They understand to follow-up with a primary care doctor and to return if any worsening or concerning symptoms  Final Clinical Impression(s) / ED Diagnoses Final diagnoses:  Fall, initial encounter  Multiple contusions  Skin tear of right hand without complication, initial encounter    Rx / DC Orders ED Discharge Orders         Ordered    oxyCODONE-acetaminophen (PERCOCET/ROXICET) 5-325 MG tablet  Every 6 hours PRN     07/07/19 2251           Terrilee Files, MD 07/08/19 [phone]

## 2019-07-07 NOTE — ED Notes (Signed)
Attempted to ambulate pt, pt reports some nausea, pt requests another pain pill, md notified.

## 2019-07-11 ENCOUNTER — Other Ambulatory Visit: Payer: Self-pay

## 2019-07-11 ENCOUNTER — Ambulatory Visit (INDEPENDENT_AMBULATORY_CARE_PROVIDER_SITE_OTHER): Payer: Medicare Other | Admitting: Nurse Practitioner

## 2019-07-11 VITALS — BP 120/76 | HR 75 | Temp 98.2°F | Resp 18 | Ht 64.0 in | Wt 156.4 lb

## 2019-07-11 DIAGNOSIS — R110 Nausea: Secondary | ICD-10-CM

## 2019-07-11 DIAGNOSIS — T148XXA Other injury of unspecified body region, initial encounter: Secondary | ICD-10-CM | POA: Diagnosis not present

## 2019-07-11 DIAGNOSIS — Z09 Encounter for follow-up examination after completed treatment for conditions other than malignant neoplasm: Secondary | ICD-10-CM

## 2019-07-11 MED ORDER — ONDANSETRON HCL 4 MG PO TABS: 4.0000 mg | ORAL_TABLET | Freq: Three times a day (TID) | ORAL | 0 refills | Status: DC | PRN

## 2019-07-11 NOTE — Patient Instructions (Signed)
Refilled Zofran for chronic nausea You have completed the oxycodone that the ER prescribed. May take Over the counter Tylenol/Acetamenophen 325 mg : take 2 every 6 hours as needed for pain. Do not exceed 4000 mg in 24 hours.  Follow up for worsening or non resolving pain.  Seek medical attention for symptoms of infection to the several skin scrapes and tears from your fall such as increased redness, swelling, drainage or foul odor, fever/chills.

## 2019-07-11 NOTE — Progress Notes (Signed)
Established Patient Office Visit  Subjective:  Patient ID: Jorge Diesel., male    DOB: 02-12-1951  Age: 69 y.o. MRN: [id number]  CC:  Chief Complaint  Patient presents with  . Hospitalization Follow-up    falling    HPI Jorge Kissick. is a 69 year old accompanied by his wife presenting or ER follow up after a fall. He is feeling better with less pain today. He ask for reill of Zofran for his chronic nausea and the pain medication oxycodone that was prescribed during ER visit. His pain is a 2/10 and took one oxycodone his last about 3 hours ago. He has tried no other treatment for pain relief.   Time spent discussing his chronic nausea and refill is reasonable for Zofran.  Time spent discussing pain management after fall and the pt s pain is tolerable. He will take otc tylenol 325 mg tablets-2 tablets every 6 hours as needed for pain and if this is not sufficient he will contact the clinic.   Past Medical History:  Diagnosis Date  . Anemia   . GERD (gastroesophageal reflux disease)     Past Surgical History:  Procedure Laterality Date  . LUMBAR FUSION    . STOMACH SURGERY      Family History  Problem Relation Age of Onset  . Heart attack Mother   . Emphysema Father   . Cancer Father        lung    Social History   Socioeconomic History  . Marital status: Married    Spouse name: [place]  . Number of children: 1  . Years of education: 12 +  . Highest education level: Not on file  Occupational History  . Occupation: Retired  Tobacco Use  . Smoking status: Former Smoker    Quit date: 04/04/1998    Years since quitting: 21.2  . Smokeless tobacco: Never Used  Substance and Sexual Activity  . Alcohol use: No  . Drug use: No  . Sexual activity: Not on file  Other Topics Concern  . Not on file  Social History Narrative   Lives w/ wife   Caffeine use: 2 cups coffee per week   Social Determinants of Health   Financial Resource Strain:   . Difficulty  of Paying Living Expenses:   Food Insecurity:   . Worried About Charity fundraiser in the Last Year:   . Arboriculturist in the Last Year:   Transportation Needs:   . Film/video editor (Medical):   Marland Kitchen Lack of Transportation (Non-Medical):   Physical Activity:   . Days of Exercise per Week:   . Minutes of Exercise per Session:   Stress:   . Feeling of Stress :   Social Connections:   . Frequency of Communication with Friends and Family:   . Frequency of Social Gatherings with Friends and Family:   . Attends Religious Services:   . Active Member of Clubs or Organizations:   . Attends Archivist Meetings:   Marland Kitchen Marital Status:   Intimate Partner Violence:   . Fear of Current or Ex-Partner:   . Emotionally Abused:   Marland Kitchen Physically Abused:   . Sexually Abused:     Outpatient Medications Prior to Visit  Medication Sig Dispense Refill  . ferrous sulfate 325 (65 FE) MG tablet TAKE 1 TABLET BY MOUTH THREE TIMES A DAY WITH MEALS (Patient taking differently: Take 325 mg by mouth 2 (two) times daily  with a meal. ) 270 tablet 0  . sildenafil (VIAGRA) 100 MG tablet Take 0.5-1 tablets (50-100 mg total) by mouth daily as needed for erectile dysfunction. 5 tablet 3  . sucralfate (CARAFATE) 1 g tablet TAKE 1 TABLET (1 G TOTAL) BY MOUTH 4 (FOUR) TIMES DAILY - WITH MEALS AND AT BEDTIME. 360 tablet 2  . ondansetron (ZOFRAN) 4 MG tablet TAKE 1 TABLET BY MOUTH EVERY 8 HOURS AS NEEDED FOR NAUSEA AND VOMITING (Patient taking differently: Take 4 mg by mouth every 8 (eight) hours as needed for nausea or vomiting. ) 30 tablet 1  . oxyCODONE-acetaminophen (PERCOCET/ROXICET) 5-325 MG tablet Take 1-2 tablets by mouth every 6 (six) hours as needed for severe pain. 15 tablet 0   No facility-administered medications prior to visit.    Allergies  Allergen Reactions  . Demerol [Meperidine] Rash    ROS Review of Systems  All other systems reviewed and are negative.     Objective:    Physical  Exam  Constitutional: He is oriented to person, place, and time. Vital signs are normal. He appears well-developed and well-nourished. He is cooperative. He does not appear ill. No distress.  HENT:  Head: Normocephalic.  Eyes: Pupils are equal, round, and reactive to light. Conjunctivae and EOM are normal.  Cardiovascular: Normal rate.  Pulmonary/Chest: Effort normal.  Musculoskeletal:        General: No edema. Normal range of motion.     Cervical back: Normal range of motion and neck supple.  Neurological: He is alert and oriented to person, place, and time.  Skin: Skin is warm and dry. He is not diaphoretic.  Multiple scabbed, healing skin tears/scrapes from fall. All closed and non drainage no erythema.  Psychiatric: He has a normal mood and affect. His behavior is normal.  Vitals reviewed.   BP 120/76 (BP Location: Right Arm, Patient Position: Sitting, Cuff Size: Normal)   Pulse 75   Temp 98.2 F (36.8 C) (Temporal)   Resp 18   Ht 5\' 4"  (1.626 m)   Wt 156 lb 6.4 oz (70.9 kg)   SpO2 96%   BMI 26.85 kg/m  Wt Readings from Last 3 Encounters:  07/11/19 156 lb 6.4 oz (70.9 kg)  07/07/19 175 lb (79.4 kg)  05/02/19 158 lb (71.7 kg)     Health Maintenance Due  Topic Date Due  . TETANUS/TDAP  Never done    There are no preventive care reminders to display for this patient.  Lab Results  Component Value Date   TSH 2.40 12/08/2015   Lab Results  Component Value Date   WBC 6.7 11/16/2018   HGB 15.4 11/16/2018   HCT 45.6 11/16/2018   MCV 85.9 11/16/2018   PLT 202 11/16/2018   Lab Results  Component Value Date   NA 141 11/16/2018   K 4.0 11/16/2018   CO2 20 11/16/2018   GLUCOSE 80 11/16/2018   BUN 11 11/16/2018   CREATININE 0.95 11/16/2018   BILITOT 0.6 11/16/2018   ALKPHOS 84 11/11/2016   AST 15 11/16/2018   ALT 11 11/16/2018   PROT 6.5 11/16/2018   ALBUMIN 4.0 11/11/2016   CALCIUM 9.0 11/16/2018   ANIONGAP 9 11/11/2016   GFR 84.54 12/01/2014   Lab  Results  Component Value Date   CHOL 160 11/16/2018   Lab Results  Component Value Date   HDL 39 (L) 11/16/2018   Lab Results  Component Value Date   LDLCALC 102 (H) 11/16/2018   Lab Results  Component Value Date   TRIG 92 11/16/2018   Lab Results  Component Value Date   CHOLHDL 4.1 11/16/2018   Lab Results  Component Value Date   HGBA1C 5.3 12/08/2015      Assessment & Plan:   Problem List Items Addressed This Visit      Other   Chronic nausea   Relevant Medications   ondansetron (ZOFRAN) 4 MG tablet    Other Visit Diagnoses    Multiple skin tears    -  Primary   Follow-up exam          Meds ordered this encounter  Medications  . ondansetron (ZOFRAN) 4 MG tablet    Sig: Take 1 tablet (4 mg total) by mouth every 8 (eight) hours as needed for nausea or vomiting.    Dispense:  21 tablet    Refill:  0    Follow-up: Return if symptoms worsen or fail to improve.    Elmore Guise, FNP

## 2019-07-12 ENCOUNTER — Telehealth: Payer: Self-pay | Admitting: *Deleted

## 2019-07-12 NOTE — Telephone Encounter (Signed)
Received call from patient wife, [place].   Reports that patient stopped taking Oxy codone as directed by Crystal in [facility] on 07/11/2019.  pain medication oxycodone that was prescribed during ER visit. His pain is a 2/10 and took one oxycodone his last about 3 hours ago. He has tried no other treatment for pain relief. Time spent discussing his chronic nausea and refill is reasonable for Zofran. Time spent discussing pain management after fall and the pt s pain is tolerable. He will take otc tylenol 325 mg tablets-2 tablets every 6 hours as needed for pain and if this is not sufficient he will contact the clinic.   Reports that he has been having severe HA since stopping the medication. Inquired as to if this is normal when stopping Oxycodone.   MD please advise.

## 2019-07-12 NOTE — Telephone Encounter (Signed)
He can have HA after stopping the narcotic medication As long as no vision changes, new vomiting associated and pain improves with tylenol he is okay Also hydrate himself.  He he thinks he may have hit his head with the fall down the stairs and now having new onset headache he needs to go to [facility] for recheck and CT on head to r/o bleed

## 2019-07-12 NOTE — Telephone Encounter (Signed)
noted 

## 2019-07-12 NOTE — Telephone Encounter (Signed)
Call placed to patient and patient wife made aware.   States that he has been taking APAP as directed, but his HA is worsening.   Advised to go to nearest ER.

## 2019-07-12 NOTE — Telephone Encounter (Signed)
Report called to [facility] ER.

## 2019-07-17 ENCOUNTER — Telehealth: Payer: Self-pay | Admitting: Family Medicine

## 2019-07-17 NOTE — Telephone Encounter (Signed)
Patient's wife called after-hours line on Sunday, April 11. He has sustained a fall had a last radiation which are superficial to his left knee however there is erythema and some warmth to it. There was no drainage. He was still able to bend the joint. She did use an antibiotic ointment that did not seem to be improving. I started him on Keflex 500 mg twice daily advised to follow-up in the office if not improving.  He has been recently seen in the office status post his fall  With regards to his headache that resolved so he never went to the emergency room

## 2019-07-24 ENCOUNTER — Other Ambulatory Visit: Payer: Self-pay | Admitting: Nurse Practitioner

## 2019-07-24 DIAGNOSIS — R110 Nausea: Secondary | ICD-10-CM

## 2019-07-29 ENCOUNTER — Encounter: Payer: Self-pay | Admitting: Family Medicine

## 2019-07-29 ENCOUNTER — Ambulatory Visit (INDEPENDENT_AMBULATORY_CARE_PROVIDER_SITE_OTHER): Payer: Medicare Other | Admitting: Family Medicine

## 2019-07-29 ENCOUNTER — Other Ambulatory Visit: Payer: Self-pay

## 2019-07-29 VITALS — BP 126/80 | HR 66 | Temp 98.3°F | Resp 14 | Ht 64.0 in | Wt 158.0 lb

## 2019-07-29 DIAGNOSIS — F5104 Psychophysiologic insomnia: Secondary | ICD-10-CM

## 2019-07-29 DIAGNOSIS — S81812D Laceration without foreign body, left lower leg, subsequent encounter: Secondary | ICD-10-CM

## 2019-07-29 DIAGNOSIS — M25512 Pain in left shoulder: Secondary | ICD-10-CM

## 2019-07-29 DIAGNOSIS — S81811D Laceration without foreign body, right lower leg, subsequent encounter: Secondary | ICD-10-CM | POA: Diagnosis not present

## 2019-07-29 DIAGNOSIS — S2232XD Fracture of one rib, left side, subsequent encounter for fracture with routine healing: Secondary | ICD-10-CM | POA: Diagnosis not present

## 2019-07-29 DIAGNOSIS — F330 Major depressive disorder, recurrent, mild: Secondary | ICD-10-CM

## 2019-07-29 MED ORDER — TRAZODONE HCL 50 MG PO TABS: 25.0000 mg | ORAL_TABLET | Freq: Every evening | ORAL | 3 refills | Status: DC | PRN

## 2019-07-29 NOTE — Patient Instructions (Addendum)
[facility]  [address] Get the chest xray  F/U CANCEL APPT FORM MAY  SCHEDULE CPE FOR MID [place]

## 2019-07-29 NOTE — Assessment & Plan Note (Signed)
Continue prn trazodone.  

## 2019-07-29 NOTE — Progress Notes (Signed)
Subjective:    Patient ID: Jorge Ponto., male    DOB: 08-May-1950, 69 y.o.   MRN: [id number]  Patient presents for ER F/U (S/P fall- L Knee injury- L Rib pain)   Pt here to f/u recent falls. Seen in ER and by our NP, last visit  4/8. He fell down after missing a step at his brothers funeral, resulting in multuiple skin tears. No LOC.  Xrays of shoulder, knee, ankle, hand , chest were unremarkable, chest xray did have left lateral rib fracture but age indeterminate, he was given percoect in ED At his OV on 4/8 no new complaints, his chronic nausea was a little worse, but relieved with zofran. HE was also advised to come off the oxycodone and take tylenol. His wife called office few days later, stating he had severe HA see phone note from  4/9, advised to go to ER as no imaging of head done and fall in elderly, but he never went. His wife called after hours line 4/11 stating he had redness and warmth around slow healing skin tear on knee, I prescribed keflex BID and avised to f/u in office if not improving. He also states his HA was better which is why he didn't go to er  Today he still has some soreness in his left knee, but the redness has gone down and skin tear almost healed. Main concern is soreness and what feels like swelling on left ribs. Also has mild soreness at shoulder blade but able to use shoulder normally  He has occ cough with clear mucous but no SOB, no facial pressure or drainage  MDD- still takes trazodone prn, but feels mood has been good   Review Of Systems:  GEN- denies fatigue, fever, weight loss,weakness, recent illness HEENT- denies eye drainage, change in vision, nasal discharge, CVS- denies chest pain, palpitations RESP- denies SOB, cough, wheeze ABD- denies N/V, change in stools, abd pain GU- denies dysuria, hematuria, dribbling, incontinence MSK- + joint pain, muscle aches, injury Neuro- denies headache, dizziness, syncope, seizure activity        Objective:    BP 126/80   Pulse 66   Temp 98.3 F (36.8 C) (Temporal)   Resp 14   Ht 5\' 4"  (1.626 m)   Wt 158 lb (71.7 kg)   SpO2 98%   BMI 27.12 kg/m  GEN- NAD, alert and oriented x3 HEENT- PERRL, EOMI, non injected sclera, pink conjunctiva, MMM, oropharynx clear Neck- Supple, no thyromegaly CVS- RRR, no murmur RESP-CTAB Chest wall- TTP along left lateral upper ribs, no particular swelling noted MSK- fair ROM bilat UE, no winged scapula, rotator cuff in tact, fair ROM bilat knees, no effusion Skin- healing skin tear right lower leg, no erythema no warmth, small wellhealed tear over left knee, very small opening less than eraser head, no drainage, NT ABD-NABS,soft,NT,ND NEURO-CNII-XII grossly in tact  EXT- No edema Pulses- Radial, DP- 2+        Assessment & Plan:      Problem List Items Addressed This Visit      Unprioritized   Insomnia   MDD (major depressive disorder)    Continue prn trazodone       Relevant Medications   traZODone (DESYREL) 50 MG tablet    Other Visit Diagnoses    Skin tear of right lower leg without complication, subsequent encounter    -  Primary   no sign of superinfection keep covered, using ointment to leg and knee, no  further oral antibiotics needed    Skin tear of left lower leg without complication, subsequent encounter       Closed fracture of one rib of left side with routine healing, subsequent encounter       continued pain/tenderness, as indeterminate on orginal imging, will repeat imaging, use tylenpl as needed, no sig respiratory symptoms    Relevant Orders   DG Ribs Unilateral Left   Acute pain of left shoulder       bruising s/p fall, good ROM for age, no repeat imaging needed at this time, strength in tact       Note: This dictation was prepared with Dragon dictation along with smaller phrase technology. Any transcriptional errors that result from this process are unintentional.

## 2019-07-30 ENCOUNTER — Ambulatory Visit
Admission: RE | Admit: 2019-07-30 | Discharge: 2019-07-30 | Disposition: A | Discharge status: Home / Self Care | Payer: Medicare Other | Source: Ambulatory Visit | Attending: Family Medicine | Admitting: Family Medicine

## 2019-07-30 ENCOUNTER — Other Ambulatory Visit: Payer: Self-pay | Admitting: Family Medicine

## 2019-07-30 DIAGNOSIS — S2242XA Multiple fractures of ribs, left side, initial encounter for closed fracture: Secondary | ICD-10-CM | POA: Diagnosis not present

## 2019-07-30 DIAGNOSIS — S2232XD Fracture of one rib, left side, subsequent encounter for fracture with routine healing: Secondary | ICD-10-CM

## 2019-08-19 ENCOUNTER — Ambulatory Visit: Payer: Medicare Other | Admitting: Family Medicine

## 2019-08-23 ENCOUNTER — Ambulatory Visit
Admission: RE | Admit: 2019-08-23 | Discharge: 2019-08-23 | Disposition: A | Discharge status: Home / Self Care | Payer: Medicare Other | Source: Ambulatory Visit | Attending: Family Medicine | Admitting: Family Medicine

## 2019-08-23 ENCOUNTER — Encounter: Payer: Self-pay | Admitting: Family Medicine

## 2019-08-23 ENCOUNTER — Other Ambulatory Visit: Payer: Self-pay

## 2019-08-23 ENCOUNTER — Ambulatory Visit (INDEPENDENT_AMBULATORY_CARE_PROVIDER_SITE_OTHER): Payer: Medicare Other | Admitting: Family Medicine

## 2019-08-23 VITALS — BP 128/68 | HR 80 | Temp 97.9°F | Resp 14 | Ht 64.0 in | Wt 161.0 lb

## 2019-08-23 DIAGNOSIS — D509 Iron deficiency anemia, unspecified: Secondary | ICD-10-CM | POA: Diagnosis not present

## 2019-08-23 DIAGNOSIS — E162 Hypoglycemia, unspecified: Secondary | ICD-10-CM | POA: Diagnosis not present

## 2019-08-23 DIAGNOSIS — S2242XD Multiple fractures of ribs, left side, subsequent encounter for fracture with routine healing: Secondary | ICD-10-CM

## 2019-08-23 DIAGNOSIS — E559 Vitamin D deficiency, unspecified: Secondary | ICD-10-CM

## 2019-08-23 NOTE — Patient Instructions (Signed)
[facility] [address] Get the xray  F/U as previous

## 2019-08-23 NOTE — Assessment & Plan Note (Signed)
has history of dizzy spells, improved after drinking/eating, check A1C, will also check iron and vitamin  D

## 2019-08-23 NOTE — Progress Notes (Signed)
Subjective:    Patient ID: Jorge Evans., male    DOB: 1950/12/02, 69 y.o.   MRN: [id number]  Patient presents for Follow-up (is fasting)  Pt here to discuss vitamins  And medications   He had one of his typical dizzy spells the other day ,felt more woozy, he got up and had diet coke and took his iron pills and felt better approx 10 minutes later, no chest pain, no SOB  not sure if it was because his sugar was low or if its is iron  No falls    He wanted recommendations for vitamin supplements    He is taking iron regulary per report   he was on vitamin D due to deficiency   He had 2 rib fractures on the left side 3rd and 4th mildly displaced due for recheck  he does notice that his left side of chest seems to be sticking out more than the right But he doesn't have any pain     He has a nurse from his insurance company that comes once a month    Note on review of meds he has not been taking remeron consistently states his nerves have been good and appetite is good  , but he is using trazodone   Reviewed last set of labs and most recent imaging   Review Of Systems:  GEN- denies fatigue, fever, weight loss,weakness, recent illness HEENT- denies eye drainage, change in vision, nasal discharge, CVS- denies chest pain, palpitations RESP- denies SOB, cough, wheeze ABD- denies N/V, change in stools, abd pain GU- denies dysuria, hematuria, dribbling, incontinence MSK- denies joint pain, muscle aches, injury Neuro- denies headache,+ dizziness, syncope, seizure activity       Objective:    BP 128/68   Pulse 80   Temp 97.9 F (36.6 C) (Temporal)   Resp 14   Ht 5\' 4"  (1.626 m)   Wt 161 lb (73 kg)   SpO2 96%   BMI 27.64 kg/m  HEENT- PERRL, EOMI, non injected sclera, pink conjunctiva, MMM, oropharynx clear Neck- Supple, no thyromegaly CVS- RRR, no murmur RESP-CTAB Chest wall- mild prominence of left chest wall compared to right, at level of left breast/nipple, no mass  palpated, NT MSK-, fair ROM bilat knees, no effusion Skin- left knee- small scab over previous abrasion healed, no warmth and right shin- small superficial scab  NEURO-CNII-XII grossly in tact  EXT- No edema Pulses- Radial, DP- 2+     Assessment & Plan:      Problem List Items Addressed This Visit      Unprioritized   Hypoglycemia    has history of dizzy spells, improved after drinking/eating, check A1C, will also check iron and vitamin  D      Relevant Orders   Comprehensive metabolic panel   Hemoglobin A1c   Iron deficiency anemia   Relevant Orders   Iron, TIBC and Ferritin Panel   CBC with Differential/Platelet   Vitamin D deficiency   Relevant Orders   Vitamin D, 25-hydroxy    Other Visit Diagnoses    Closed fracture of multiple ribs of left side with routine healing, subsequent encounter    -  Primary   area of fracture Non tender he does have a little more prominence over the left brest tissue but no mass palpated, recheck XRAY   Relevant Orders   DG Ribs Unilateral W/Chest Left      Note: This dictation was prepared with Dragon dictation along with  smaller phrase technology. Any transcriptional errors that result from this process are unintentional.

## 2019-08-24 LAB — COMPREHENSIVE METABOLIC PANEL
AG Ratio: 2 (calc) (ref 1.0–2.5)
ALT: 15 U/L (ref 9–46)
AST: 15 U/L (ref 10–35)
Albumin: 3.9 g/dL (ref 3.6–5.1)
Alkaline phosphatase (APISO): 88 U/L (ref 35–144)
BUN: 10 mg/dL (ref 7–25)
CO2: 26 mmol/L (ref 20–32)
Calcium: 8.8 mg/dL (ref 8.6–10.3)
Chloride: 105 mmol/L (ref 98–110)
Creat: 0.9 mg/dL (ref 0.70–1.25)
Globulin: 2 g/dL (calc) (ref 1.9–3.7)
Glucose, Bld: 86 mg/dL (ref 65–99)
Potassium: 4.4 mmol/L (ref 3.5–5.3)
Sodium: 138 mmol/L (ref 135–146)
Total Bilirubin: 0.6 mg/dL (ref 0.2–1.2)
Total Protein: 5.9 g/dL — ABNORMAL LOW (ref 6.1–8.1)

## 2019-08-24 LAB — CBC WITH DIFFERENTIAL/PLATELET
Absolute Monocytes: 708 cells/uL (ref 200–950)
Basophils Absolute: 51 cells/uL (ref 0–200)
Basophils Relative: 0.7 %
Eosinophils Absolute: 292 cells/uL (ref 15–500)
Eosinophils Relative: 4 %
HCT: 44.6 % (ref 38.5–50.0)
Hemoglobin: 15 g/dL (ref 13.2–17.1)
Lymphs Abs: 1978 cells/uL (ref 850–3900)
MCH: 28.8 pg (ref 27.0–33.0)
MCHC: 33.6 g/dL (ref 32.0–36.0)
MCV: 85.6 fL (ref 80.0–100.0)
MPV: 11.8 fL (ref 7.5–12.5)
Monocytes Relative: 9.7 %
Neutro Abs: 4271 cells/uL (ref 1500–7800)
Neutrophils Relative %: 58.5 %
Platelets: 180 10*3/uL (ref 140–400)
RBC: 5.21 10*6/uL (ref 4.20–5.80)
RDW: 14 % (ref 11.0–15.0)
Total Lymphocyte: 27.1 %
WBC: 7.3 10*3/uL (ref 3.8–10.8)

## 2019-08-24 LAB — IRON,TIBC AND FERRITIN PANEL
%SAT: 30 % (calc) (ref 20–48)
Ferritin: 20 ng/mL — ABNORMAL LOW (ref 24–380)
Iron: 96 ug/dL (ref 50–180)
TIBC: 320 mcg/dL (calc) (ref 250–425)

## 2019-08-24 LAB — HEMOGLOBIN A1C
Hgb A1c MFr Bld: 4.9 % of total Hgb (ref <5.7)
Mean Plasma Glucose: 94 (calc)
eAG (mmol/L): 5.2 (calc)

## 2019-08-24 LAB — VITAMIN D 25 HYDROXY (VIT D DEFICIENCY, FRACTURES): Vit D, 25-Hydroxy: 8 ng/mL — ABNORMAL LOW (ref 30–100)

## 2019-08-28 ENCOUNTER — Other Ambulatory Visit: Payer: Self-pay | Admitting: *Deleted

## 2019-08-28 DIAGNOSIS — H25813 Combined forms of age-related cataract, bilateral: Secondary | ICD-10-CM | POA: Diagnosis not present

## 2019-08-28 DIAGNOSIS — H524 Presbyopia: Secondary | ICD-10-CM | POA: Diagnosis not present

## 2019-08-28 MED ORDER — VITAMIN D (ERGOCALCIFEROL) 1.25 MG (50000 UNIT) PO CAPS: 50000.0000 [IU] | ORAL_CAPSULE | ORAL | 0 refills | Status: DC

## 2019-08-28 MED ORDER — FERROUS SULFATE 325 (65 FE) MG PO TABS: 325.0000 mg | ORAL_TABLET | Freq: Two times a day (BID) | ORAL | 3 refills | Status: AC

## 2019-08-28 MED ORDER — VITAMIN C 250 MG PO TABS: 250.0000 mg | ORAL_TABLET | Freq: Every day | ORAL | 3 refills | Status: DC

## 2019-09-26 DIAGNOSIS — H25013 Cortical age-related cataract, bilateral: Secondary | ICD-10-CM | POA: Diagnosis not present

## 2019-09-26 DIAGNOSIS — H2513 Age-related nuclear cataract, bilateral: Secondary | ICD-10-CM | POA: Diagnosis not present

## 2019-09-26 DIAGNOSIS — H25043 Posterior subcapsular polar age-related cataract, bilateral: Secondary | ICD-10-CM | POA: Diagnosis not present

## 2019-09-26 DIAGNOSIS — H18413 Arcus senilis, bilateral: Secondary | ICD-10-CM | POA: Diagnosis not present

## 2019-09-26 DIAGNOSIS — H2512 Age-related nuclear cataract, left eye: Secondary | ICD-10-CM | POA: Diagnosis not present

## 2019-10-01 ENCOUNTER — Other Ambulatory Visit: Payer: Self-pay | Admitting: Family Medicine

## 2019-10-01 DIAGNOSIS — R110 Nausea: Secondary | ICD-10-CM

## 2019-10-04 DIAGNOSIS — H2512 Age-related nuclear cataract, left eye: Secondary | ICD-10-CM | POA: Diagnosis not present

## 2019-10-04 DIAGNOSIS — H2511 Age-related nuclear cataract, right eye: Secondary | ICD-10-CM | POA: Diagnosis not present

## 2019-10-28 DIAGNOSIS — H2511 Age-related nuclear cataract, right eye: Secondary | ICD-10-CM | POA: Diagnosis not present

## 2019-10-28 DIAGNOSIS — Z961 Presence of intraocular lens: Secondary | ICD-10-CM | POA: Diagnosis not present

## 2019-11-19 ENCOUNTER — Other Ambulatory Visit: Payer: Self-pay

## 2019-11-19 ENCOUNTER — Encounter: Payer: Self-pay | Admitting: Family Medicine

## 2019-11-19 ENCOUNTER — Ambulatory Visit (INDEPENDENT_AMBULATORY_CARE_PROVIDER_SITE_OTHER): Payer: Medicare Other | Admitting: Family Medicine

## 2019-11-19 VITALS — BP 110/66 | HR 75 | Temp 98.8°F | Resp 18 | Ht 64.0 in | Wt 155.0 lb

## 2019-11-19 DIAGNOSIS — Z125 Encounter for screening for malignant neoplasm of prostate: Secondary | ICD-10-CM

## 2019-11-19 DIAGNOSIS — Z13220 Encounter for screening for lipoid disorders: Secondary | ICD-10-CM | POA: Diagnosis not present

## 2019-11-19 DIAGNOSIS — Z0001 Encounter for general adult medical examination with abnormal findings: Secondary | ICD-10-CM

## 2019-11-19 DIAGNOSIS — D509 Iron deficiency anemia, unspecified: Secondary | ICD-10-CM

## 2019-11-19 DIAGNOSIS — E559 Vitamin D deficiency, unspecified: Secondary | ICD-10-CM

## 2019-11-19 DIAGNOSIS — F5104 Psychophysiologic insomnia: Secondary | ICD-10-CM

## 2019-11-19 DIAGNOSIS — Z0000 Encounter for general adult medical examination without abnormal findings: Secondary | ICD-10-CM

## 2019-11-19 DIAGNOSIS — Z136 Encounter for screening for cardiovascular disorders: Secondary | ICD-10-CM | POA: Diagnosis not present

## 2019-11-19 MED ORDER — TETANUS-DIPHTH-ACELL PERTUSSIS 5-2.5-18.5 LF-MCG/0.5 IM SUSP: 0.5000 mL | Freq: Once | INTRAMUSCULAR | 0 refills | Status: AC

## 2019-11-19 MED ORDER — ZOSTER VAC RECOMB ADJUVANTED 50 MCG/0.5ML IM SUSR: 0.5000 mL | Freq: Once | INTRAMUSCULAR | 0 refills | Status: AC

## 2019-11-19 NOTE — Progress Notes (Signed)
Subjective:   Patient presents for Medicare Annual/Subsequent preventive examination.  Chronic nauesa unchanged- continues ton zofran, carafate, still gets GI upset in epigastrium Weight down 5lbs since May , he is walking now and trying to cut down on sweets  Iron def anemia- taking supplements as prescribed   Chronic insomnia- takes trazodone as needed   Vitamin D def- recently completed high dose vitamin D for 12, weeks due for recheck   Due for lipid panel   He had cataract surgery in June and July with Dr. Darel Hong   Review Past Medical/Family/Social:Per EMR   Risk Factors Current exercise habits:walks Dietary issues discussed:he has cut back sweets  Cardiac risk factors:None  Depression Screen - PHQ 9 SCORE 1   Activities of Daily Living In your present state of health, do you have any difficulty performing the following activities?:  Driving?No Managing money?No Feeding yourself? No  Getting from bed to chair? No  Climbing a flight of stairs? No  Preparing food and eating?: No  Bathing or showering? No  Getting dressed: No  Getting to the toilet? No  Using the toilet:No  Moving around from place to place:Yes In the past year have you fallen or had a near fall?:No  Are you sexually active? No  Do you have more than one partner? No   Hearing Difficulties:No  Do you often ask people to speak up or repeat themselves? No  Do you experience ringing or noises in your ears? No Do you have difficulty understanding soft or whispered voices? No  Do you feel that you have a problem with memory? No Do you often misplace items? No  Do you feel safe at home?Yes  Cognitive Testing Alert? Yes Normal Appearance?Yes  Oriented to person? Yes Place? Yes  Time? Yes  Recall of three objects? Yes  Can perform simple calculations? Yes  Displays appropriate judgment?Yes  Can read the correct time from a watch face?Yes   List the Names of Other  Physician/Practitioners you currently use: [name] Dentist Eye Doctor- Dr. Cyndia Diver eye doctor/ Dr. Darel Hong   Screening Tests / Date Colonoscopy2017 Zostavax- Due Pneumonia- UTD  Tetanus/tdap- Due  COVID-19 UTD   ROS: GEN- denies fatigue, fever, +weight loss,weakness, recent illness HEENT- denies eye drainage, change in vision, nasal discharge, CVS- denies chest pain, palpitations RESP- denies SOB, cough, wheeze ABD- denies N/V, change in stools, abd pain GU- denies dysuria, hematuria, dribbling, incontinence MSK- denies joint pain, muscle aches, injury Neuro- denies headache, dizziness, syncope, seizure activity  Physical:, vitals reviewed GEN- NAD, alert and oriented x3 HEENT- PERRL, EOMI, non injected sclera, pink conjunctiva, MMM, oropharynx clear,TM mild wax in the canals  Neck- Supple, no thryomegaly, no bruit CVS- RRR, no murmur RESP-CTAB ABD-nabs,SOFT,NT,ND PSYCH- normal affect and mood  EXT- No edema Pulses- Radial, DP- 2+    Assessment:   Annual wellness medicare exam   Plan:   During the course of the visit the patient was educated and counseled about appropriate screening and preventive services including:   Discussed shingles vaccine/TDAPsent to pharmacy   Chronic insomnia controlled with trazodone   Fasting labsdone due for lipid screening   Advanced directives- has handout at home- FULL CODE    Chronic nausea- continue current meds , symptoms controlled, intentionally cut back on sweets to lose weight    Iron def anemia- recheck Hb, continue supplement  Audit C/Depression screen negative   Prostate cancer screening done  F/U 6 months   Patient Instructions (the written plan) was  given to the patient.  Medicare Attestation  I have personally reviewed:  The patient's medical and social history  Their use of alcohol, tobacco or illicit drugs  Their current medications and supplements  The  patient's functional ability including ADLs,fall risks, home safety risks, cognitive, and hearing and visual impairment  Diet and physical activities  Evidence for depression or mood disorders  The patient's weight, height, BMI, and visual acuity have been recorded in the chart. I have made referrals, counseling, and provided education to the patient based on review of the above and I have provided the patient with a written personalized care plan for preventive services.

## 2019-11-19 NOTE — Patient Instructions (Addendum)
F/U 6 months We will call with lab results  Take vitamin D 1000IU once a day Continue your iron supplement

## 2019-11-20 LAB — COMPREHENSIVE METABOLIC PANEL
AG Ratio: 2.3 (calc) (ref 1.0–2.5)
ALT: 11 U/L (ref 9–46)
AST: 15 U/L (ref 10–35)
Albumin: 4.3 g/dL (ref 3.6–5.1)
Alkaline phosphatase (APISO): 69 U/L (ref 35–144)
BUN: 10 mg/dL (ref 7–25)
CO2: 23 mmol/L (ref 20–32)
Calcium: 9.2 mg/dL (ref 8.6–10.3)
Chloride: 108 mmol/L (ref 98–110)
Creat: 0.93 mg/dL (ref 0.70–1.18)
Globulin: 1.9 g/dL (calc) (ref 1.9–3.7)
Glucose, Bld: 84 mg/dL (ref 65–99)
Potassium: 4.1 mmol/L (ref 3.5–5.3)
Sodium: 140 mmol/L (ref 135–146)
Total Bilirubin: 0.8 mg/dL (ref 0.2–1.2)
Total Protein: 6.2 g/dL (ref 6.1–8.1)

## 2019-11-20 LAB — LIPID PANEL
Cholesterol: 163 mg/dL (ref <200)
HDL: 37 mg/dL — ABNORMAL LOW (ref >=40)
LDL Cholesterol (Calc): 111 mg/dL (calc) — ABNORMAL HIGH
Non-HDL Cholesterol (Calc): 126 mg/dL (calc) (ref <130)
Total CHOL/HDL Ratio: 4.4 (calc) (ref <5.0)
Triglycerides: 66 mg/dL (ref <150)

## 2019-11-20 LAB — CBC WITH DIFFERENTIAL/PLATELET
Absolute Monocytes: 592 cells/uL (ref 200–950)
Basophils Absolute: 52 cells/uL (ref 0–200)
Basophils Relative: 0.8 %
Eosinophils Absolute: 163 cells/uL (ref 15–500)
Eosinophils Relative: 2.5 %
HCT: 47.7 % (ref 38.5–50.0)
Hemoglobin: 16.1 g/dL (ref 13.2–17.1)
Lymphs Abs: 1528 cells/uL (ref 850–3900)
MCH: 29.8 pg (ref 27.0–33.0)
MCHC: 33.8 g/dL (ref 32.0–36.0)
MCV: 88.3 fL (ref 80.0–100.0)
MPV: 11.9 fL (ref 7.5–12.5)
Monocytes Relative: 9.1 %
Neutro Abs: 4167 cells/uL (ref 1500–7800)
Neutrophils Relative %: 64.1 %
Platelets: 173 10*3/uL (ref 140–400)
RBC: 5.4 10*6/uL (ref 4.20–5.80)
RDW: 13.1 % (ref 11.0–15.0)
Total Lymphocyte: 23.5 %
WBC: 6.5 10*3/uL (ref 3.8–10.8)

## 2019-11-20 LAB — IRON,TIBC AND FERRITIN PANEL
%SAT: 37 % (calc) (ref 20–48)
Ferritin: 37 ng/mL (ref 24–380)
Iron: 119 ug/dL (ref 50–180)
TIBC: 319 mcg/dL (calc) (ref 250–425)

## 2019-11-20 LAB — VITAMIN D 25 HYDROXY (VIT D DEFICIENCY, FRACTURES): Vit D, 25-Hydroxy: 64 ng/mL (ref 30–100)

## 2019-11-20 LAB — PSA: PSA: 2.8 ng/mL (ref <=4.0)

## 2019-11-21 ENCOUNTER — Other Ambulatory Visit: Payer: Self-pay | Admitting: Family Medicine

## 2019-11-21 MED ORDER — VITAMIN D 50 MCG (2000 UT) PO CAPS: 1.0000 | ORAL_CAPSULE | Freq: Every day | ORAL | 11 refills | Status: DC

## 2019-12-30 ENCOUNTER — Other Ambulatory Visit: Payer: Self-pay | Admitting: Family Medicine

## 2020-01-21 ENCOUNTER — Other Ambulatory Visit: Payer: Self-pay | Admitting: Family Medicine

## 2020-01-21 DIAGNOSIS — R110 Nausea: Secondary | ICD-10-CM

## 2020-01-21 MED ORDER — VITAMIN D 50 MCG (2000 UT) PO CAPS: 1.0000 | ORAL_CAPSULE | Freq: Every day | ORAL | 11 refills | Status: AC

## 2020-03-12 ENCOUNTER — Inpatient Hospital Stay (HOSPITAL_COMMUNITY)
Admission: EM | Admit: 2020-03-12 | Discharge: 2020-05-05 | DRG: 417 | Disposition: E | Payer: Medicare Other | Attending: Pulmonary Disease | Admitting: Pulmonary Disease

## 2020-03-12 ENCOUNTER — Emergency Department (HOSPITAL_COMMUNITY): Payer: Medicare Other

## 2020-03-12 ENCOUNTER — Encounter (HOSPITAL_COMMUNITY): Payer: Self-pay | Admitting: Emergency Medicine

## 2020-03-12 DIAGNOSIS — K567 Ileus, unspecified: Secondary | ICD-10-CM | POA: Diagnosis not present

## 2020-03-12 DIAGNOSIS — Z981 Arthrodesis status: Secondary | ICD-10-CM

## 2020-03-12 DIAGNOSIS — D649 Anemia, unspecified: Secondary | ICD-10-CM | POA: Diagnosis not present

## 2020-03-12 DIAGNOSIS — E43 Unspecified severe protein-calorie malnutrition: Secondary | ICD-10-CM | POA: Diagnosis not present

## 2020-03-12 DIAGNOSIS — Z79899 Other long term (current) drug therapy: Secondary | ICD-10-CM

## 2020-03-12 DIAGNOSIS — D62 Acute posthemorrhagic anemia: Secondary | ICD-10-CM | POA: Diagnosis not present

## 2020-03-12 DIAGNOSIS — I471 Supraventricular tachycardia: Secondary | ICD-10-CM | POA: Diagnosis not present

## 2020-03-12 DIAGNOSIS — K8063 Calculus of gallbladder and bile duct with acute cholecystitis with obstruction: Secondary | ICD-10-CM | POA: Diagnosis not present

## 2020-03-12 DIAGNOSIS — R1013 Epigastric pain: Secondary | ICD-10-CM | POA: Diagnosis not present

## 2020-03-12 DIAGNOSIS — R0602 Shortness of breath: Secondary | ICD-10-CM | POA: Diagnosis not present

## 2020-03-12 DIAGNOSIS — A419 Sepsis, unspecified organism: Secondary | ICD-10-CM | POA: Diagnosis not present

## 2020-03-12 DIAGNOSIS — Z66 Do not resuscitate: Secondary | ICD-10-CM | POA: Diagnosis not present

## 2020-03-12 DIAGNOSIS — J690 Pneumonitis due to inhalation of food and vomit: Secondary | ICD-10-CM | POA: Diagnosis not present

## 2020-03-12 DIAGNOSIS — K8050 Calculus of bile duct without cholangitis or cholecystitis without obstruction: Secondary | ICD-10-CM

## 2020-03-12 DIAGNOSIS — R069 Unspecified abnormalities of breathing: Secondary | ICD-10-CM

## 2020-03-12 DIAGNOSIS — R109 Unspecified abdominal pain: Secondary | ICD-10-CM

## 2020-03-12 DIAGNOSIS — R601 Generalized edema: Secondary | ICD-10-CM | POA: Diagnosis not present

## 2020-03-12 DIAGNOSIS — J80 Acute respiratory distress syndrome: Secondary | ICD-10-CM | POA: Diagnosis not present

## 2020-03-12 DIAGNOSIS — R110 Nausea: Secondary | ICD-10-CM | POA: Diagnosis not present

## 2020-03-12 DIAGNOSIS — K449 Diaphragmatic hernia without obstruction or gangrene: Secondary | ICD-10-CM | POA: Diagnosis present

## 2020-03-12 DIAGNOSIS — K831 Obstruction of bile duct: Secondary | ICD-10-CM

## 2020-03-12 DIAGNOSIS — Z8249 Family history of ischemic heart disease and other diseases of the circulatory system: Secondary | ICD-10-CM

## 2020-03-12 DIAGNOSIS — K838 Other specified diseases of biliary tract: Secondary | ICD-10-CM

## 2020-03-12 DIAGNOSIS — R52 Pain, unspecified: Secondary | ICD-10-CM | POA: Diagnosis not present

## 2020-03-12 DIAGNOSIS — R1110 Vomiting, unspecified: Secondary | ICD-10-CM | POA: Diagnosis not present

## 2020-03-12 DIAGNOSIS — R6521 Severe sepsis with septic shock: Secondary | ICD-10-CM | POA: Diagnosis not present

## 2020-03-12 DIAGNOSIS — R7989 Other specified abnormal findings of blood chemistry: Secondary | ICD-10-CM

## 2020-03-12 DIAGNOSIS — E876 Hypokalemia: Secondary | ICD-10-CM | POA: Diagnosis not present

## 2020-03-12 DIAGNOSIS — K2970 Gastritis, unspecified, without bleeding: Secondary | ICD-10-CM | POA: Diagnosis not present

## 2020-03-12 DIAGNOSIS — D6489 Other specified anemias: Secondary | ICD-10-CM | POA: Diagnosis not present

## 2020-03-12 DIAGNOSIS — I700 Atherosclerosis of aorta: Secondary | ICD-10-CM | POA: Diagnosis not present

## 2020-03-12 DIAGNOSIS — E559 Vitamin D deficiency, unspecified: Secondary | ICD-10-CM | POA: Diagnosis present

## 2020-03-12 DIAGNOSIS — K658 Other peritonitis: Secondary | ICD-10-CM | POA: Diagnosis not present

## 2020-03-12 DIAGNOSIS — K9189 Other postprocedural complications and disorders of digestive system: Secondary | ICD-10-CM | POA: Diagnosis not present

## 2020-03-12 DIAGNOSIS — D509 Iron deficiency anemia, unspecified: Secondary | ICD-10-CM | POA: Diagnosis not present

## 2020-03-12 DIAGNOSIS — I4819 Other persistent atrial fibrillation: Secondary | ICD-10-CM | POA: Diagnosis not present

## 2020-03-12 DIAGNOSIS — R1084 Generalized abdominal pain: Secondary | ICD-10-CM | POA: Diagnosis not present

## 2020-03-12 DIAGNOSIS — Z9911 Dependence on respirator [ventilator] status: Secondary | ICD-10-CM | POA: Diagnosis not present

## 2020-03-12 DIAGNOSIS — K660 Peritoneal adhesions (postprocedural) (postinfection): Secondary | ICD-10-CM | POA: Diagnosis not present

## 2020-03-12 DIAGNOSIS — K219 Gastro-esophageal reflux disease without esophagitis: Secondary | ICD-10-CM | POA: Diagnosis not present

## 2020-03-12 DIAGNOSIS — Y838 Other surgical procedures as the cause of abnormal reaction of the patient, or of later complication, without mention of misadventure at the time of the procedure: Secondary | ICD-10-CM | POA: Diagnosis not present

## 2020-03-12 DIAGNOSIS — K7689 Other specified diseases of liver: Secondary | ICD-10-CM

## 2020-03-12 DIAGNOSIS — I4891 Unspecified atrial fibrillation: Secondary | ICD-10-CM

## 2020-03-12 DIAGNOSIS — Z7189 Other specified counseling: Secondary | ICD-10-CM | POA: Diagnosis not present

## 2020-03-12 DIAGNOSIS — Z87891 Personal history of nicotine dependence: Secondary | ICD-10-CM

## 2020-03-12 DIAGNOSIS — D6959 Other secondary thrombocytopenia: Secondary | ICD-10-CM | POA: Diagnosis not present

## 2020-03-12 DIAGNOSIS — J9601 Acute respiratory failure with hypoxia: Secondary | ICD-10-CM | POA: Diagnosis not present

## 2020-03-12 DIAGNOSIS — Z4659 Encounter for fitting and adjustment of other gastrointestinal appliance and device: Secondary | ICD-10-CM | POA: Diagnosis not present

## 2020-03-12 DIAGNOSIS — Z825 Family history of asthma and other chronic lower respiratory diseases: Secondary | ICD-10-CM

## 2020-03-12 DIAGNOSIS — Z515 Encounter for palliative care: Secondary | ICD-10-CM

## 2020-03-12 DIAGNOSIS — Z20822 Contact with and (suspected) exposure to covid-19: Secondary | ICD-10-CM | POA: Diagnosis present

## 2020-03-12 DIAGNOSIS — Z903 Acquired absence of stomach [part of]: Secondary | ICD-10-CM

## 2020-03-12 DIAGNOSIS — R739 Hyperglycemia, unspecified: Secondary | ICD-10-CM | POA: Diagnosis not present

## 2020-03-12 DIAGNOSIS — Z801 Family history of malignant neoplasm of trachea, bronchus and lung: Secondary | ICD-10-CM

## 2020-03-12 DIAGNOSIS — R112 Nausea with vomiting, unspecified: Secondary | ICD-10-CM | POA: Diagnosis not present

## 2020-03-12 DIAGNOSIS — R0600 Dyspnea, unspecified: Secondary | ICD-10-CM | POA: Diagnosis not present

## 2020-03-12 DIAGNOSIS — Z885 Allergy status to narcotic agent status: Secondary | ICD-10-CM

## 2020-03-12 DIAGNOSIS — T17908A Unspecified foreign body in respiratory tract, part unspecified causing other injury, initial encounter: Secondary | ICD-10-CM

## 2020-03-12 DIAGNOSIS — Z452 Encounter for adjustment and management of vascular access device: Secondary | ICD-10-CM

## 2020-03-12 LAB — COMPREHENSIVE METABOLIC PANEL
ALT: 93 U/L — ABNORMAL HIGH (ref 0–44)
AST: 137 U/L — ABNORMAL HIGH (ref 15–41)
Albumin: 3.8 g/dL (ref 3.5–5.0)
Alkaline Phosphatase: 124 U/L (ref 38–126)
Anion gap: 12 (ref 5–15)
BUN: 9 mg/dL (ref 8–23)
CO2: 22 mmol/L (ref 22–32)
Calcium: 8.8 mg/dL — ABNORMAL LOW (ref 8.9–10.3)
Chloride: 104 mmol/L (ref 98–111)
Creatinine, Ser: 0.94 mg/dL (ref 0.61–1.24)
GFR, Estimated: >60 mL/min (ref >60)
Glucose, Bld: 133 mg/dL — ABNORMAL HIGH (ref 70–99)
Potassium: 4.3 mmol/L (ref 3.5–5.1)
Sodium: 138 mmol/L (ref 135–145)
Total Bilirubin: 2.3 mg/dL — ABNORMAL HIGH (ref 0.3–1.2)
Total Protein: 6.4 g/dL — ABNORMAL LOW (ref 6.5–8.1)

## 2020-03-12 LAB — URINALYSIS, ROUTINE W REFLEX MICROSCOPIC
Bacteria, UA: NONE SEEN
Glucose, UA: NEGATIVE mg/dL
Ketones, ur: 20 mg/dL — AB
Leukocytes,Ua: NEGATIVE
Nitrite: NEGATIVE
Protein, ur: 100 mg/dL — AB
Specific Gravity, Urine: 1.024 (ref 1.005–1.030)
pH: 5 (ref 5.0–8.0)

## 2020-03-12 LAB — CBC
HCT: 47.9 % (ref 39.0–52.0)
Hemoglobin: 15.6 g/dL (ref 13.0–17.0)
MCH: 29.3 pg (ref 26.0–34.0)
MCHC: 32.6 g/dL (ref 30.0–36.0)
MCV: 90 fL (ref 80.0–100.0)
Platelets: 210 10*3/uL (ref 150–400)
RBC: 5.32 MIL/uL (ref 4.22–5.81)
RDW: 12.5 % (ref 11.5–15.5)
WBC: 11.7 10*3/uL — ABNORMAL HIGH (ref 4.0–10.5)
nRBC: 0 % (ref 0.0–0.2)

## 2020-03-12 LAB — LIPASE, BLOOD: Lipase: 20 U/L (ref 11–51)

## 2020-03-12 MED ORDER — PIPERACILLIN-TAZOBACTAM 3.375 G IVPB 30 MIN
3.3750 g | Freq: Once | INTRAVENOUS | Status: AC
  Administered 2020-03-13: 3.375 g via INTRAVENOUS
  Filled 2020-03-12: qty 50

## 2020-03-12 MED ORDER — IOHEXOL 300 MG/ML  SOLN
100.0000 mL | Freq: Once | INTRAMUSCULAR | Status: AC | PRN
  Administered 2020-03-12: 100 mL via INTRAVENOUS

## 2020-03-12 MED ORDER — SODIUM CHLORIDE 0.9 % IV BOLUS
1000.0000 mL | Freq: Once | INTRAVENOUS | Status: AC
  Administered 2020-03-12: 1000 mL via INTRAVENOUS

## 2020-03-12 NOTE — ED Notes (Signed)
Patient transported to CT 

## 2020-03-12 NOTE — ED Triage Notes (Addendum)
Pt arrives via [facility] from home for c/o abd pain with n/v, onset 1 hour ago. Denies constipation. EMS VSS. Had part of his stomach removed many years ago. Did take carafate at home.

## 2020-03-12 NOTE — ED Notes (Signed)
Jorge Evans(Wife#[phone]) called/would like a call for update and once in room.  She is waiting in her car in the Emergency Room Parking lot. 

## 2020-03-12 NOTE — ED Provider Notes (Signed)
[facility] EMERGENCY DEPARTMENT Provider Note   CSN: [id number] Arrival date & time: 10-Apr-2020  1442     History Chief Complaint  Patient presents with  . Abdominal Pain  . Vomiting    Jorge Evans. is a 69 y.o. male.  HPI 69 year old male presents with abdominal pain and nausea/vomiting.  Patient started feeling bad this afternoon with abrupt bloating sensation and abdominal pain.  The abdominal pain is diffuse.  His vomiting is more of spit up but without blood.  No fevers, chest pain, shortness of breath.  He did have a couple bowel movements but this did not change his pain.  No diarrhea or back pain.  Right now the pain is improved compared to earlier but he still feels uncomfortable.   Past Medical History:  Diagnosis Date  . Anemia   . GERD (gastroesophageal reflux disease)     Patient Active Problem List   Diagnosis Date Noted  . Vitamin D deficiency 08/23/2019  . ED (erectile dysfunction) 02/18/2019  . Insomnia 06/08/2018  . MDD (major depressive disorder) 06/08/2018  . Chronic nausea 09/12/2016  . Hypoglycemia 09/12/2016  . Iron deficiency anemia 07/11/2016  . Dehydration 12/06/2015  . Vertigo 11/10/2015  . GERD (gastroesophageal reflux disease) 12/01/2014  . Idiopathic scoliosis 12/01/2014    Past Surgical History:  Procedure Laterality Date  . LUMBAR FUSION    . STOMACH SURGERY         Family History  Problem Relation Age of Onset  . Heart attack Mother   . Emphysema Father   . Cancer Father        lung    Social History   Tobacco Use  . Smoking status: Former Smoker    Quit date: 04/04/1998    Years since quitting: 21.9  . Smokeless tobacco: Never Used  Vaping Use  . Vaping Use: Never used  Substance Use Topics  . Alcohol use: No  . Drug use: No    Home Medications Prior to Admission medications   Medication Sig Start Date End Date Taking? Authorizing Provider  Cholecalciferol (VITAMIN D) 50 MCG (2000 UT)  CAPS Take 1 capsule (2,000 Units total) by mouth daily. 01/21/20  Yes [place], Velna Hatchet, MD  ferrous sulfate 325 (65 FE) MG tablet Take 1 tablet (325 mg total) by mouth 2 (two) times daily with a meal. Patient taking differently: Take 325 mg by mouth daily with breakfast. 08/28/19  Yes [place], Velna Hatchet, MD  ondansetron (ZOFRAN) 4 MG tablet TAKE 1 TABLET BY MOUTH EVERY 8 HOURS AS NEEDED FOR NAUSEA AND VOMITING Patient taking differently: Take 4 mg by mouth every 8 (eight) hours as needed for nausea or vomiting. 01/21/20  Yes [place], Velna Hatchet, MD  sucralfate (CARAFATE) 1 g tablet TAKE 1 TABLET (1 G TOTAL) BY MOUTH 4 (FOUR) TIMES DAILY - WITH MEALS AND AT BEDTIME. 12/30/19  Yes [place], Velna Hatchet, MD  vitamin C (ASCORBIC ACID) 250 MG tablet Take 1 tablet (250 mg total) by mouth daily. Patient not taking: No sig reported 08/28/19   Salley Scarlet, MD    Allergies    Other and Demerol [meperidine]  Review of Systems   Review of Systems  Constitutional: Negative for fever.  Respiratory: Negative for shortness of breath.   Cardiovascular: Negative for chest pain.  Gastrointestinal: Positive for abdominal pain and vomiting. Negative for constipation and diarrhea.  Musculoskeletal: Negative for back pain.  All other systems reviewed and are negative.  Physical Exam Updated Vital Signs BP (!) 126/45   Pulse 92   Temp 98.1 F (36.7 C) (Oral)   Resp 18   Ht 5\' 6"  (1.676 m)   Wt 69.9 kg   SpO2 98%   BMI 24.86 kg/m   Physical Exam Vitals and nursing note reviewed.  Constitutional:      General: He is not in acute distress.    Appearance: He is well-developed and well-nourished. He is not ill-appearing or diaphoretic.  HENT:     Head: Normocephalic and atraumatic.     Right Ear: External ear normal.     Left Ear: External ear normal.     Nose: Nose normal.  Eyes:     General:        Right eye: No discharge.        Left eye: No discharge.  Cardiovascular:     Rate and Rhythm:  Normal rate and regular rhythm.     Heart sounds: Normal heart sounds.  Pulmonary:     Effort: Pulmonary effort is normal.     Breath sounds: Normal breath sounds.  Abdominal:     Palpations: Abdomen is soft.     Tenderness: There is generalized abdominal tenderness.  Musculoskeletal:        General: No edema.     Cervical back: Neck supple.  Skin:    General: Skin is warm and dry.  Neurological:     Mental Status: He is alert.  Psychiatric:        Mood and Affect: Mood is not anxious.     ED Results / Procedures / Treatments   Labs (all labs ordered are listed, but only abnormal results are displayed) Labs Reviewed  COMPREHENSIVE METABOLIC PANEL - Abnormal; Notable for the following components:      Result Value   Glucose, Bld 133 (*)    Calcium 8.8 (*)    Total Protein 6.4 (*)    AST 137 (*)    ALT 93 (*)    Total Bilirubin 2.3 (*)    All other components within normal limits  CBC - Abnormal; Notable for the following components:   WBC 11.7 (*)    All other components within normal limits  URINALYSIS, ROUTINE W REFLEX MICROSCOPIC - Abnormal; Notable for the following components:   Color, Urine AMBER (*)    APPearance HAZY (*)    Hgb urine dipstick SMALL (*)    Bilirubin Urine SMALL (*)    Ketones, ur 20 (*)    Protein, ur 100 (*)    All other components within normal limits  RESP PANEL BY RT-PCR (FLU A&B, COVID) ARPGX2  LIPASE, BLOOD    EKG EKG Interpretation  Date/Time:  Thursday March 12 2020 21:52:39 EST Ventricular Rate:  93 PR Interval:    QRS Duration: 158 QT Interval:  363 QTC Calculation: 452 R Axis:   -49 Text Interpretation: Sinus rhythm Short PR interval RBBB and LAFB Baseline wander in lead(s) V6 no significant change since 2018 Confirmed by 2019 [phone]) on 03/14/2020 10:36:09 PM   Radiology CT ABDOMEN PELVIS W CONTRAST  Result Date: 03/20/2020 CLINICAL DATA:  Nausea and vomiting EXAM: CT ABDOMEN AND PELVIS WITH CONTRAST  TECHNIQUE: Multidetector CT imaging of the abdomen and pelvis was performed using the standard protocol following bolus administration of intravenous contrast. CONTRAST:  14/12/2019 OMNIPAQUE IOHEXOL 300 MG/ML  SOLN COMPARISON:  CT 12/23/2016 FINDINGS: Lower chest: Lung bases demonstrate mild scarring at the left base. No acute  consolidation or effusion. Surgical clips at the GE junction. Hepatobiliary: Gallbladder appears slightly dilated. No calcified gallstones. Interval development of moderate intra and extrahepatic biliary dilatation, common duct dilated up to 14 mm. Multiple rounded filling defects within the mid to distal common duct, series 5, image number 45, suspicious for stones. Pancreas: Fatty atrophy without inflammatory change Spleen: Normal in size without focal abnormality. Adrenals/Urinary Tract: Adrenal glands are normal. Kidneys show no hydronephrosis. Slightly thick-walled appearance of urinary bladder without inflammatory change. Streaky hyperdense foci in the bladder with more focal hyperdensity along the left posterior bladder near the ureteral insertion, suspect that this is due to excreted contrast Stomach/Bowel: Status post partial gastrectomy with small bowel anastomosis within the anterior abdomen. No evidence for obstruction. No acute bowel wall thickening. Negative appendix. Vascular/Lymphatic: Moderate aortic atherosclerosis. No aneurysm. No suspicious nodes. Reproductive: Prostate is unremarkable. Other: No free air or free fluid. Small fat containing umbilical hernia. Musculoskeletal: Scoliosis and postsurgical changes of the lumbar spine. Dysplastic appearing left acetabulum and femoral head and neck, chronic. IMPRESSION: 1. Interval development of moderate intra and extrahepatic biliary dilatation with multiple rounded filling defects within the mid to distal common duct, suspicious for choledocholithiasis. Suggest correlation with LFTs, further evaluation with MRCP may be obtained. 2.  Streaky hyperdense foci within the bladder, suspect that this is due to excreted contrast as opposed to bladder mass. 3. Status post partial gastrectomy with no evidence for an obstruction. 4. Aortic atherosclerosis. Aortic Atherosclerosis (ICD10-I70.0). Electronically Signed   By: Jasmine Pang M.D.   On: 04/01/2020 23:04    Procedures Procedures (including critical care time)  Medications Ordered in ED Medications  piperacillin-tazobactam (ZOSYN) IVPB 3.375 g (has no administration in time range)  sodium chloride 0.9 % bolus 1,000 mL (1,000 mLs Intravenous New Bag/Given 04/01/2020 2216)  iohexol (OMNIPAQUE) 300 MG/ML solution 100 mL (100 mLs Intravenous Contrast Given 04/01/2020 2229)    ED Course  I have reviewed the triage vital signs and the nursing notes.  Pertinent labs & imaging results that were available during my care of the patient were reviewed by me and considered in my medical decision making (see chart for details).    MDM Rules/Calculators/A&P                          Patient's CT scan has been reviewed and shows what appears to be choledocholithiasis.  He is not ill-appearing but does have mild LFT abnormalities and elevated WBC.  He has declined pain medicine at this time.  I have consulted [name], his gastroenterologist.  He will need to be admitted by hospitalist service.  MRCP ordered. Final Clinical Impression(s) / ED Diagnoses Final diagnoses:  Choledocholithiasis    Rx / DC Orders ED Discharge Orders    None       Pricilla Loveless, MD 03/25/2020 1546

## 2020-03-12 NOTE — ED Notes (Signed)
[place](Wife) called back/would like a call back from patient.  Thank you

## 2020-03-12 NOTE — ED Notes (Signed)
Pt back from CT

## 2020-03-13 ENCOUNTER — Other Ambulatory Visit: Payer: Self-pay

## 2020-03-13 ENCOUNTER — Encounter (HOSPITAL_COMMUNITY): Admission: EM | Disposition: E | Payer: Self-pay | Source: Home / Self Care | Attending: Internal Medicine

## 2020-03-13 ENCOUNTER — Inpatient Hospital Stay (HOSPITAL_COMMUNITY): Payer: Medicare Other | Admitting: Certified Registered Nurse Anesthetist

## 2020-03-13 ENCOUNTER — Inpatient Hospital Stay (HOSPITAL_COMMUNITY): Payer: Medicare Other

## 2020-03-13 ENCOUNTER — Encounter (HOSPITAL_COMMUNITY): Payer: Self-pay | Admitting: Internal Medicine

## 2020-03-13 DIAGNOSIS — K219 Gastro-esophageal reflux disease without esophagitis: Secondary | ICD-10-CM | POA: Diagnosis not present

## 2020-03-13 DIAGNOSIS — K6389 Other specified diseases of intestine: Secondary | ICD-10-CM | POA: Diagnosis not present

## 2020-03-13 DIAGNOSIS — D62 Acute posthemorrhagic anemia: Secondary | ICD-10-CM | POA: Diagnosis not present

## 2020-03-13 DIAGNOSIS — R0602 Shortness of breath: Secondary | ICD-10-CM | POA: Diagnosis not present

## 2020-03-13 DIAGNOSIS — R748 Abnormal levels of other serum enzymes: Secondary | ICD-10-CM | POA: Diagnosis not present

## 2020-03-13 DIAGNOSIS — R6521 Severe sepsis with septic shock: Secondary | ICD-10-CM | POA: Diagnosis not present

## 2020-03-13 DIAGNOSIS — R1013 Epigastric pain: Secondary | ICD-10-CM | POA: Diagnosis not present

## 2020-03-13 DIAGNOSIS — K2210 Ulcer of esophagus without bleeding: Secondary | ICD-10-CM | POA: Diagnosis not present

## 2020-03-13 DIAGNOSIS — K567 Ileus, unspecified: Secondary | ICD-10-CM | POA: Diagnosis not present

## 2020-03-13 DIAGNOSIS — Z9911 Dependence on respirator [ventilator] status: Secondary | ICD-10-CM | POA: Diagnosis not present

## 2020-03-13 DIAGNOSIS — R188 Other ascites: Secondary | ICD-10-CM | POA: Diagnosis not present

## 2020-03-13 DIAGNOSIS — R1084 Generalized abdominal pain: Secondary | ICD-10-CM | POA: Diagnosis present

## 2020-03-13 DIAGNOSIS — R932 Abnormal findings on diagnostic imaging of liver and biliary tract: Secondary | ICD-10-CM | POA: Diagnosis not present

## 2020-03-13 DIAGNOSIS — E876 Hypokalemia: Secondary | ICD-10-CM | POA: Diagnosis not present

## 2020-03-13 DIAGNOSIS — J9811 Atelectasis: Secondary | ICD-10-CM | POA: Diagnosis not present

## 2020-03-13 DIAGNOSIS — K8063 Calculus of gallbladder and bile duct with acute cholecystitis with obstruction: Secondary | ICD-10-CM | POA: Diagnosis not present

## 2020-03-13 DIAGNOSIS — K8050 Calculus of bile duct without cholangitis or cholecystitis without obstruction: Secondary | ICD-10-CM | POA: Diagnosis not present

## 2020-03-13 DIAGNOSIS — R110 Nausea: Secondary | ICD-10-CM | POA: Diagnosis not present

## 2020-03-13 DIAGNOSIS — K8051 Calculus of bile duct without cholangitis or cholecystitis with obstruction: Secondary | ICD-10-CM | POA: Diagnosis not present

## 2020-03-13 DIAGNOSIS — E559 Vitamin D deficiency, unspecified: Secondary | ICD-10-CM | POA: Diagnosis not present

## 2020-03-13 DIAGNOSIS — I4891 Unspecified atrial fibrillation: Secondary | ICD-10-CM | POA: Diagnosis not present

## 2020-03-13 DIAGNOSIS — Y838 Other surgical procedures as the cause of abnormal reaction of the patient, or of later complication, without mention of misadventure at the time of the procedure: Secondary | ICD-10-CM | POA: Diagnosis not present

## 2020-03-13 DIAGNOSIS — K3189 Other diseases of stomach and duodenum: Secondary | ICD-10-CM | POA: Diagnosis not present

## 2020-03-13 DIAGNOSIS — J690 Pneumonitis due to inhalation of food and vomit: Secondary | ICD-10-CM | POA: Diagnosis not present

## 2020-03-13 DIAGNOSIS — Z9889 Other specified postprocedural states: Secondary | ICD-10-CM | POA: Diagnosis not present

## 2020-03-13 DIAGNOSIS — Z452 Encounter for adjustment and management of vascular access device: Secondary | ICD-10-CM | POA: Diagnosis not present

## 2020-03-13 DIAGNOSIS — K668 Other specified disorders of peritoneum: Secondary | ICD-10-CM | POA: Diagnosis not present

## 2020-03-13 DIAGNOSIS — D6489 Other specified anemias: Secondary | ICD-10-CM | POA: Diagnosis not present

## 2020-03-13 DIAGNOSIS — K838 Other specified diseases of biliary tract: Secondary | ICD-10-CM | POA: Diagnosis not present

## 2020-03-13 DIAGNOSIS — Z515 Encounter for palliative care: Secondary | ICD-10-CM | POA: Diagnosis not present

## 2020-03-13 DIAGNOSIS — K832 Perforation of bile duct: Secondary | ICD-10-CM | POA: Diagnosis not present

## 2020-03-13 DIAGNOSIS — D509 Iron deficiency anemia, unspecified: Secondary | ICD-10-CM | POA: Diagnosis not present

## 2020-03-13 DIAGNOSIS — Z66 Do not resuscitate: Secondary | ICD-10-CM | POA: Diagnosis not present

## 2020-03-13 DIAGNOSIS — R918 Other nonspecific abnormal finding of lung field: Secondary | ICD-10-CM | POA: Diagnosis not present

## 2020-03-13 DIAGNOSIS — K5730 Diverticulosis of large intestine without perforation or abscess without bleeding: Secondary | ICD-10-CM | POA: Diagnosis not present

## 2020-03-13 DIAGNOSIS — K8689 Other specified diseases of pancreas: Secondary | ICD-10-CM | POA: Diagnosis not present

## 2020-03-13 DIAGNOSIS — R0603 Acute respiratory distress: Secondary | ICD-10-CM | POA: Diagnosis not present

## 2020-03-13 DIAGNOSIS — K811 Chronic cholecystitis: Secondary | ICD-10-CM | POA: Diagnosis not present

## 2020-03-13 DIAGNOSIS — I700 Atherosclerosis of aorta: Secondary | ICD-10-CM | POA: Diagnosis present

## 2020-03-13 DIAGNOSIS — K449 Diaphragmatic hernia without obstruction or gangrene: Secondary | ICD-10-CM | POA: Diagnosis not present

## 2020-03-13 DIAGNOSIS — K9189 Other postprocedural complications and disorders of digestive system: Secondary | ICD-10-CM | POA: Diagnosis not present

## 2020-03-13 DIAGNOSIS — I471 Supraventricular tachycardia: Secondary | ICD-10-CM | POA: Diagnosis not present

## 2020-03-13 DIAGNOSIS — Z9689 Presence of other specified functional implants: Secondary | ICD-10-CM | POA: Diagnosis not present

## 2020-03-13 DIAGNOSIS — R739 Hyperglycemia, unspecified: Secondary | ICD-10-CM | POA: Diagnosis not present

## 2020-03-13 DIAGNOSIS — A419 Sepsis, unspecified organism: Secondary | ICD-10-CM | POA: Diagnosis not present

## 2020-03-13 DIAGNOSIS — R601 Generalized edema: Secondary | ICD-10-CM | POA: Diagnosis not present

## 2020-03-13 DIAGNOSIS — K658 Other peritonitis: Secondary | ICD-10-CM | POA: Diagnosis not present

## 2020-03-13 DIAGNOSIS — I4819 Other persistent atrial fibrillation: Secondary | ICD-10-CM | POA: Diagnosis not present

## 2020-03-13 DIAGNOSIS — R7989 Other specified abnormal findings of blood chemistry: Secondary | ICD-10-CM | POA: Diagnosis not present

## 2020-03-13 DIAGNOSIS — Z4659 Encounter for fitting and adjustment of other gastrointestinal appliance and device: Secondary | ICD-10-CM | POA: Diagnosis not present

## 2020-03-13 DIAGNOSIS — Z20822 Contact with and (suspected) exposure to covid-19: Secondary | ICD-10-CM | POA: Diagnosis present

## 2020-03-13 DIAGNOSIS — E43 Unspecified severe protein-calorie malnutrition: Secondary | ICD-10-CM | POA: Diagnosis not present

## 2020-03-13 DIAGNOSIS — J9601 Acute respiratory failure with hypoxia: Secondary | ICD-10-CM | POA: Diagnosis not present

## 2020-03-13 DIAGNOSIS — K2970 Gastritis, unspecified, without bleeding: Secondary | ICD-10-CM | POA: Diagnosis not present

## 2020-03-13 DIAGNOSIS — K8020 Calculus of gallbladder without cholecystitis without obstruction: Secondary | ICD-10-CM | POA: Diagnosis not present

## 2020-03-13 DIAGNOSIS — R109 Unspecified abdominal pain: Secondary | ICD-10-CM | POA: Diagnosis not present

## 2020-03-13 DIAGNOSIS — Z4682 Encounter for fitting and adjustment of non-vascular catheter: Secondary | ICD-10-CM | POA: Diagnosis not present

## 2020-03-13 DIAGNOSIS — J80 Acute respiratory distress syndrome: Secondary | ICD-10-CM | POA: Diagnosis not present

## 2020-03-13 DIAGNOSIS — R0600 Dyspnea, unspecified: Secondary | ICD-10-CM | POA: Diagnosis not present

## 2020-03-13 DIAGNOSIS — K660 Peritoneal adhesions (postprocedural) (postinfection): Secondary | ICD-10-CM | POA: Diagnosis present

## 2020-03-13 DIAGNOSIS — Z7189 Other specified counseling: Secondary | ICD-10-CM | POA: Diagnosis not present

## 2020-03-13 DIAGNOSIS — K259 Gastric ulcer, unspecified as acute or chronic, without hemorrhage or perforation: Secondary | ICD-10-CM | POA: Diagnosis not present

## 2020-03-13 DIAGNOSIS — J90 Pleural effusion, not elsewhere classified: Secondary | ICD-10-CM | POA: Diagnosis not present

## 2020-03-13 DIAGNOSIS — K831 Obstruction of bile duct: Secondary | ICD-10-CM | POA: Diagnosis not present

## 2020-03-13 HISTORY — PX: ERCP: SHX5425

## 2020-03-13 HISTORY — PX: BILIARY STENT PLACEMENT: SHX5538

## 2020-03-13 LAB — COMPREHENSIVE METABOLIC PANEL
ALT: 178 U/L — ABNORMAL HIGH (ref 0–44)
AST: 158 U/L — ABNORMAL HIGH (ref 15–41)
Albumin: 3.3 g/dL — ABNORMAL LOW (ref 3.5–5.0)
Alkaline Phosphatase: 126 U/L (ref 38–126)
Anion gap: 11 (ref 5–15)
BUN: 11 mg/dL (ref 8–23)
CO2: 21 mmol/L — ABNORMAL LOW (ref 22–32)
Calcium: 8.5 mg/dL — ABNORMAL LOW (ref 8.9–10.3)
Chloride: 106 mmol/L (ref 98–111)
Creatinine, Ser: 1.03 mg/dL (ref 0.61–1.24)
GFR, Estimated: >60 mL/min (ref >60)
Glucose, Bld: 136 mg/dL — ABNORMAL HIGH (ref 70–99)
Potassium: 4.4 mmol/L (ref 3.5–5.1)
Sodium: 138 mmol/L (ref 135–145)
Total Bilirubin: 5.3 mg/dL — ABNORMAL HIGH (ref 0.3–1.2)
Total Protein: 5.9 g/dL — ABNORMAL LOW (ref 6.5–8.1)

## 2020-03-13 LAB — CBC
HCT: 41 % (ref 39.0–52.0)
Hemoglobin: 14.4 g/dL (ref 13.0–17.0)
MCH: 30 pg (ref 26.0–34.0)
MCHC: 35.1 g/dL (ref 30.0–36.0)
MCV: 85.4 fL (ref 80.0–100.0)
Platelets: 173 10*3/uL (ref 150–400)
RBC: 4.8 MIL/uL (ref 4.22–5.81)
RDW: 12.8 % (ref 11.5–15.5)
WBC: 14.6 10*3/uL — ABNORMAL HIGH (ref 4.0–10.5)
nRBC: 0 % (ref 0.0–0.2)

## 2020-03-13 LAB — RESP PANEL BY RT-PCR (FLU A&B, COVID) ARPGX2
Influenza A by PCR: NEGATIVE
Influenza B by PCR: NEGATIVE
SARS Coronavirus 2 by RT PCR: NEGATIVE

## 2020-03-13 LAB — HIV ANTIBODY (ROUTINE TESTING W REFLEX): HIV Screen 4th Generation wRfx: NONREACTIVE

## 2020-03-13 SURGERY — ERCP, WITH INTERVENTION IF INDICATED
Anesthesia: General

## 2020-03-13 MED ORDER — LIDOCAINE 2% (20 MG/ML) 5 ML SYRINGE
INTRAMUSCULAR | Status: DC | PRN
  Administered 2020-03-13: 60 mg via INTRAVENOUS

## 2020-03-13 MED ORDER — PROPOFOL 10 MG/ML IV BOLUS
INTRAVENOUS | Status: DC | PRN
  Administered 2020-03-13: 30 mg via INTRAVENOUS
  Administered 2020-03-13: 120 mg via INTRAVENOUS

## 2020-03-13 MED ORDER — ROCURONIUM BROMIDE 10 MG/ML (PF) SYRINGE
PREFILLED_SYRINGE | INTRAVENOUS | Status: DC | PRN
  Administered 2020-03-13: 10 mg via INTRAVENOUS
  Administered 2020-03-13: 60 mg via INTRAVENOUS

## 2020-03-13 MED ORDER — ONDANSETRON HCL 4 MG PO TABS: 4.0000 mg | ORAL_TABLET | Freq: Four times a day (QID) | ORAL | Status: DC | PRN

## 2020-03-13 MED ORDER — SODIUM CHLORIDE 0.9 % IV SOLN
INTRAVENOUS | Status: DC | PRN
  Administered 2020-03-13: 20 mL

## 2020-03-13 MED ORDER — INDOMETHACIN 50 MG RE SUPP
RECTAL | Status: AC
  Filled 2020-03-13: qty 2

## 2020-03-13 MED ORDER — ONDANSETRON HCL 4 MG/2ML IJ SOLN
INTRAMUSCULAR | Status: DC | PRN
  Administered 2020-03-13: 4 mg via INTRAVENOUS

## 2020-03-13 MED ORDER — ONDANSETRON HCL 4 MG/2ML IJ SOLN
4.0000 mg | Freq: Once | INTRAMUSCULAR | Status: AC
  Administered 2020-03-13: 4 mg via INTRAVENOUS
  Filled 2020-03-13: qty 2

## 2020-03-13 MED ORDER — ACETAMINOPHEN 325 MG PO TABS
650.0000 mg | ORAL_TABLET | Freq: Four times a day (QID) | ORAL | Status: DC | PRN
  Administered 2020-03-18 – 2020-03-24 (×3): 650 mg via ORAL
  Filled 2020-03-13 (×3): qty 2

## 2020-03-13 MED ORDER — SODIUM CHLORIDE 0.9 % IV SOLN: INTRAVENOUS | Status: DC

## 2020-03-13 MED ORDER — INDOMETHACIN 50 MG RE SUPP
RECTAL | Status: DC | PRN
  Administered 2020-03-13: 100 mg via RECTAL

## 2020-03-13 MED ORDER — FENTANYL CITRATE (PF) 100 MCG/2ML IJ SOLN
50.0000 ug | Freq: Once | INTRAMUSCULAR | Status: AC
  Administered 2020-03-13: 50 ug via INTRAVENOUS
  Filled 2020-03-13: qty 2

## 2020-03-13 MED ORDER — LACTATED RINGERS IV SOLN: INTRAVENOUS | Status: DC

## 2020-03-13 MED ORDER — ACETAMINOPHEN 650 MG RE SUPP: 650.0000 mg | Freq: Four times a day (QID) | RECTAL | Status: DC | PRN

## 2020-03-13 MED ORDER — PHENYLEPHRINE 40 MCG/ML (10ML) SYRINGE FOR IV PUSH (FOR BLOOD PRESSURE SUPPORT)
PREFILLED_SYRINGE | INTRAVENOUS | Status: DC | PRN
  Administered 2020-03-13: 120 ug via INTRAVENOUS

## 2020-03-13 MED ORDER — SUGAMMADEX SODIUM 200 MG/2ML IV SOLN
INTRAVENOUS | Status: DC | PRN
  Administered 2020-03-13: 200 mg via INTRAVENOUS

## 2020-03-13 MED ORDER — ONDANSETRON HCL 4 MG/2ML IJ SOLN
4.0000 mg | Freq: Four times a day (QID) | INTRAMUSCULAR | Status: DC | PRN
  Administered 2020-03-14: 14:00:00 4 mg via INTRAVENOUS
  Filled 2020-03-13: qty 2

## 2020-03-13 MED ORDER — GLUCAGON HCL RDNA (DIAGNOSTIC) 1 MG IJ SOLR
INTRAMUSCULAR | Status: DC | PRN
  Administered 2020-03-13: .5 mg via INTRAVENOUS

## 2020-03-13 MED ORDER — PHENYLEPHRINE HCL-NACL 10-0.9 MG/250ML-% IV SOLN
INTRAVENOUS | Status: DC | PRN
  Administered 2020-03-13: 20 ug/min via INTRAVENOUS

## 2020-03-13 MED ORDER — MORPHINE SULFATE (PF) 2 MG/ML IV SOLN
2.0000 mg | INTRAVENOUS | Status: DC | PRN
  Administered 2020-03-19: 4 mg via INTRAVENOUS
  Filled 2020-03-13: qty 2

## 2020-03-13 MED ORDER — FENTANYL CITRATE (PF) 250 MCG/5ML IJ SOLN
INTRAMUSCULAR | Status: DC | PRN
  Administered 2020-03-13 (×2): 50 ug via INTRAVENOUS

## 2020-03-13 MED ORDER — DEXAMETHASONE SODIUM PHOSPHATE 10 MG/ML IJ SOLN
INTRAMUSCULAR | Status: DC | PRN
  Administered 2020-03-13: 5 mg via INTRAVENOUS

## 2020-03-13 MED ORDER — PIPERACILLIN-TAZOBACTAM 3.375 G IVPB
3.3750 g | Freq: Three times a day (TID) | INTRAVENOUS | Status: DC
  Administered 2020-03-13 – 2020-03-15 (×6): 3.375 g via INTRAVENOUS
  Filled 2020-03-13 (×8): qty 50

## 2020-03-13 MED ORDER — GLUCAGON HCL RDNA (DIAGNOSTIC) 1 MG IJ SOLR
INTRAMUSCULAR | Status: AC
  Filled 2020-03-13: qty 1

## 2020-03-13 NOTE — Transfer of Care (Signed)
Immediate Anesthesia Transfer of Care Note  Patient: Jorge Evans.  Procedure(s) Performed: ENDOSCOPIC RETROGRADE CHOLANGIOPANCREATOGRAPHY (ERCP) (N/A ) BILIARY STENT PLACEMENT  Patient Location: Endoscopy Unit  Anesthesia Type:General  Level of Consciousness: awake, alert  and oriented  Airway & Oxygen Therapy: Patient Spontanous Breathing  Post-op Assessment: Report given to RN and Post -op Vital signs reviewed and stable  Post vital signs: Reviewed and stable  Last Vitals:  Vitals Value Taken Time  BP 123/45 03/05/2020 1542  Temp    Pulse 75 03/17/2020 1544  Resp 18 03/05/2020 1544  SpO2 94 % 03/28/2020 1544  Vitals shown include unvalidated device data.  Last Pain:  Vitals:   03/04/2020 0800  TempSrc:   PainSc: 0-No pain         Complications: No complications documented.

## 2020-03-13 NOTE — Progress Notes (Signed)
Pharmacy Antibiotic Note  Jorge Evans. is a 68 y.o. male admitted on 03/16/2020 with intra abdominal infection.  Pharmacy has been consulted for zosyn dosing.  Plan: Zosyn 3.375g IV q8h (4 hour infusion).  F/u clinical course  Height: 5\' 6"  (167.6 cm) Weight: 69.9 kg (154 lb) IBW/kg (Calculated) : 63.8  Temp (24hrs), Avg:98.2 F (36.8 C), Min:98.1 F (36.7 C), Max:98.2 F (36.8 C)  Recent Labs  Lab 03/18/2020 1452  WBC 11.7*  CREATININE 0.94    Estimated Creatinine Clearance: 66 mL/min (by C-G formula based on SCr of 0.94 mg/dL).    Allergies  Allergen Reactions  . Other Itching  . Demerol [Meperidine] Rash    Thank you for allowing pharmacy to be a part of this patient's care.  14/09/21 [place] 03/17/2020 12:47 AM

## 2020-03-13 NOTE — Op Note (Signed)
[facility] Patient Name: Jorge Evans Procedure Date : 03/16/2020 MRN: [id number] Attending MD: Jeani Hawking , MD Date of Birth: 01-30-1951 CSN: [id number] Age: 69 Admit Type: Inpatient Procedure:                ERCP Indications:              Common bile duct stone(s) Providers:                Jeani Hawking, MD, Glory Rosebush, RN, Rosilyn Mings, Technician Referring MD:              Medicines:                General Anesthesia Complications:            No immediate complications. Estimated Blood Loss:     Estimated blood loss: none. Procedure:                Pre-Anesthesia Assessment:                           - Prior to the procedure, a History and Physical                            was performed, and patient medications and                            allergies were reviewed. The patient's tolerance of                            previous anesthesia was also reviewed. The risks                            and benefits of the procedure and the sedation                            options and risks were discussed with the patient.                            All questions were answered, and informed consent                            was obtained. Prior Anticoagulants: The patient has                            taken no previous anticoagulant or antiplatelet                            agents. ASA Grade Assessment: III - A patient with                            severe systemic disease. After reviewing the risks                            and  benefits, the patient was deemed in                            satisfactory condition to undergo the procedure.                           - Sedation was administered by an anesthesia                            professional. General anesthesia was attained.                           After obtaining informed consent, the scope was                            passed under direct vision. Throughout the                             procedure, the patient's blood pressure, pulse, and                            oxygen saturations were monitored continuously. The                            [id number]-Q180V ([id number]) Olympus Duodenoscope was                            introduced through the mouth, and used to inject                            contrast into and used to inject contrast into the                            bile duct and dorsal pancreatic duct. The GIF-H190                            ([id number]) Olympus gastroscope was introduced                            through the and used to inject contrast into. The                            ERCP was technically difficult and complex due to                            challenging cannulation because of abnormal                            anatomy. The patient tolerated the procedure well. Scope In: Scope Out: Findings:      The bile duct was deeply cannulated with the short-nosed traction       sphincterotome. Contrast was injected. I personally interpreted the bile       duct images. There was brisk flow of contrast through the ducts. Image  quality was excellent. Contrast extended to the hepatic ducts. The       common bile duct contained filling defect(s) thought to be a stone. The       entire biliary tree was moderately dilated and diffusely dilated, with a       stone causing an obstruction. The largest diameter was 15 mm. A long       0.035 inch Soft Jagwire was passed into the biliary tree. One 7 Fr by 5       cm biliary stent with a single external flap and a single internal flap       was placed 6 cm into the common bile duct. Bile, clear fluid and sludge       flowed through the stent. The stent was in good position.      Using the forward viewing endoscope the gastrojejunostomy of the       Billroth II was encountered and it was intact. The patient was in a left       lateral decubitus position and the inferior luminal tract was intubated.        This lead to the blind pouch and then the ampulla was identified with       retraction of the endoscope. Several attempts were made with the EGD       scope and the Billroth II sphincterotome to cannulate the ampulla, but       it was unsuccessful. The duodenoscope was then used and this allowed for       excellent positioning and visualization of the ampulla. After several       attempts the Billroth II sphincterotome was not able to allow for       cannulation of the ampulla. The standard ERCP sphincterotome was then       used and with several attempts the PD was cannulated. A two wire       technique was used and the CBD was able to be cannulated. The guidewire       was secured in the left intrahepatic ducts initially. Contrast injection       showed that the entire biliary tree was dilated and there were three       large CBD stones. The Billroth II sphincterotome was then used again,       but a sphincterotmy was not possible. The wire was in the 12 o'clock       position rather than the 6 o'oclock position. The decision was then made       to place a 7 Fr x 5 cm stent. Stent placement was performed with ease       and bile drainage as well as sludge drainage was achieved. Fluoroscopy       confirmed the position of the stent. At this point the procedure was       terminated. Impression:               - A filling defect consistent with a stone was seen                            on the cholangiogram.                           - The entire biliary tree was moderately dilated,  with a stone causing an obstruction.                           - One biliary stent was placed into the common bile                            duct. Recommendation:           - Return patient to hospital ward for ongoing care.                           - Resume regular diet.                           - - Follow liver panel. If asymptomatic tomorrow he                            can be  discharged home.                           - A referral to a tertiary care center will be made                            for him.                           - [name] will round tomorrow. Procedure Code(s):        --- Professional ---                           [id number], Endoscopic retrograde                            cholangiopancreatography (ERCP); with placement of                            endoscopic stent into biliary or pancreatic duct,                            including pre- and post-dilation and guide wire                            passage, when performed, including sphincterotomy,                            when performed, each stent                           [id number], Endoscopic catheterization of the biliary                            ductal system, radiological supervision and                            interpretation Diagnosis Code(s):        --- Professional ---  K80.51, Calculus of bile duct without cholangitis                            or cholecystitis with obstruction                           R93.2, Abnormal findings on diagnostic imaging of                            liver and biliary tract CPT copyright 2019 American Medical Association. All rights reserved. The codes documented in this report are preliminary and upon coder review may  be revised to meet current compliance requirements. Jeani HawkingPatrick Buford Gayler, MD Jeani HawkingPatrick Jennier Schissler, MD 05/13/19 4:13:39 PM This report has been signed electronically. Number of Addenda: 0

## 2020-03-13 NOTE — H&P (Signed)
History and Physical    Jorge Piaymond N Mitchum Jr. WGN:[id number] DOB: 1950/07/12 DOA: 03/17/2020  PCP: Salley Scarleturham, Kawanta F, MD  Patient coming from: Home  I have personally briefly reviewed patient[facility]  Chief Complaint: Abd pain, vomiting  HPI: Jorge PiRaymond N Shatswell Jr. is a 69 y.o. male with medical history significant of GERD.  Pt presents to the ED with c/o abd pain, N/V.  Symptoms onset abruptly this afternoon.  Pain is diffuse.  Vomiting is NBNB.  Having BMs but no change in pain.  Pain improved slightly at this time but still uncomfortable.  No fevers, CP, SOB.   ED Course: mild transaminitis, tbili 2.3.  WBC 11.7k.  CT abd pelvis shows dilated bile duct (intra and extra hepatic) with apparent round flow voids present that are suspicious for CBD stones.  No radio-opaque gallstones in duct or gallbladder.   Review of Systems: As per HPI, otherwise all review of systems negative.  Past Medical History:  Diagnosis Date  . Anemia   . GERD (gastroesophageal reflux disease)     Past Surgical History:  Procedure Laterality Date  . LUMBAR FUSION    . STOMACH SURGERY       reports that he quit smoking about 21 years ago. He has never used smokeless tobacco. He reports that he does not drink alcohol and does not use drugs.  Allergies  Allergen Reactions  . Other Itching  . Demerol [Meperidine] Rash    Family History  Problem Relation Age of Onset  . Heart attack Mother   . Emphysema Father   . Cancer Father        lung     Prior to Admission medications   Medication Sig Start Date End Date Taking? Authorizing Provider  Cholecalciferol (VITAMIN D) 50 MCG (2000 UT) CAPS Take 1 capsule (2,000 Units total) by mouth daily. 01/21/20  Yes [place], Velna HatchetKawanta F, MD  ferrous sulfate 325 (65 FE) MG tablet Take 1 tablet (325 mg total) by mouth 2 (two) times daily with a meal. Patient taking differently: Take 325 mg by mouth daily with breakfast. 08/28/19   Yes [place], Velna HatchetKawanta F, MD  ondansetron (ZOFRAN) 4 MG tablet TAKE 1 TABLET BY MOUTH EVERY 8 HOURS AS NEEDED FOR NAUSEA AND VOMITING Patient taking differently: Take 4 mg by mouth every 8 (eight) hours as needed for nausea or vomiting. 01/21/20  Yes [place], Velna HatchetKawanta F, MD  sucralfate (CARAFATE) 1 g tablet TAKE 1 TABLET (1 G TOTAL) BY MOUTH 4 (FOUR) TIMES DAILY - WITH MEALS AND AT BEDTIME. 12/30/19  Yes [place], Velna HatchetKawanta F, MD  vitamin C (ASCORBIC ACID) 250 MG tablet Take 1 tablet (250 mg total) by mouth daily. Patient not taking: No sig reported 08/28/19   Salley Scarleturham, Kawanta F, MD    Physical Exam: Vitals:   04/01/2020 1707 03/07/2020 2001 03/19/2020 2200 03/22/2020 2245  BP: (!) 151/74 108/73 (!) 156/77 (!) 126/45  Pulse: 80 85 92   Resp: 20 18 18    Temp:  98.1 F (36.7 C)    TempSrc:  Oral    SpO2: 95% 96% 98%   Weight:      Height:        Constitutional: NAD, calm, comfortable Eyes: PERRL, lids and conjunctivae normal ENMT: Mucous membranes are moist. Posterior pharynx clear of any exudate or lesions.Normal dentition.  Neck: normal, supple, no masses, no thyromegaly Respiratory: clear to auscultation bilaterally, no wheezing, no crackles. Normal respiratory effort. No accessory muscle  use.  Cardiovascular: Regular rate and rhythm, no murmurs / rubs / gallops. No extremity edema. 2+ pedal pulses. No carotid bruits.  Abdomen: no tenderness, no masses palpated. No hepatosplenomegaly. Bowel sounds positive.  Musculoskeletal: no clubbing / cyanosis. No joint deformity upper and lower extremities. Good ROM, no contractures. Normal muscle tone.  Skin: no rashes, lesions, ulcers. No induration Neurologic: CN 2-12 grossly intact. Sensation intact, DTR normal. Strength 5/5 in all 4.  Psychiatric: Normal judgment and insight. Alert and oriented x 3. Normal mood.    Labs on Admission: I have personally reviewed following labs and imaging studies  CBC: Recent Labs  Lab 03/25/2020 1452  WBC 11.7*  HGB  15.6  HCT 47.9  MCV 90.0  PLT 210   Basic Metabolic Panel: Recent Labs  Lab Mar 13, 2020 1452  NA 138  K 4.3  CL 104  CO2 22  GLUCOSE 133*  BUN 9  CREATININE 0.94  CALCIUM 8.8*   GFR: Estimated Creatinine Clearance: 66 mL/min (by C-G formula based on SCr of 0.94 mg/dL). Liver Function Tests: Recent Labs  Lab 03/07/2020 1452  AST 137*  ALT 93*  ALKPHOS 124  BILITOT 2.3*  PROT 6.4*  ALBUMIN 3.8   Recent Labs  Lab 03/15/2020 1452  LIPASE 20   No results for input(s): AMMONIA in the last 168 hours. Coagulation Profile: No results for input(s): INR, PROTIME in the last 168 hours. Cardiac Enzymes: No results for input(s): CKTOTAL, CKMB, CKMBINDEX, TROPONINI in the last 168 hours. BNP (last 3 results) No results for input(s): PROBNP in the last 8760 hours. HbA1C: No results for input(s): HGBA1C in the last 72 hours. CBG: No results for input(s): GLUCAP in the last 168 hours. Lipid Profile: No results for input(s): CHOL, HDL, LDLCALC, TRIG, CHOLHDL, LDLDIRECT in the last 72 hours. Thyroid Function Tests: No results for input(s): TSH, T4TOTAL, FREET4, T3FREE, THYROIDAB in the last 72 hours. Anemia Panel: No results for input(s): VITAMINB12, FOLATE, FERRITIN, TIBC, IRON, RETICCTPCT in the last 72 hours. Urine analysis:    Component Value Date/Time   COLORURINE AMBER (A) 03/08/2020 2005   APPEARANCEUR HAZY (A) 04/01/2020 2005   LABSPEC 1.024 03/18/2020 2005   PHURINE 5.0 13-Mar-2020 2005   GLUCOSEU NEGATIVE 13-Mar-2020 2005   HGBUR SMALL (A) 03/18/2020 2005   BILIRUBINUR SMALL (A) 03/21/2020 2005   KETONESUR 20 (A) 03/24/2020 2005   PROTEINUR 100 (A) March 13, 2020 2005   UROBILINOGEN 1.0 12/11/2011 1845   NITRITE NEGATIVE 03/23/2020 2005   LEUKOCYTESUR NEGATIVE 03/16/2020 2005    Radiological Exams on Admission: CT ABDOMEN PELVIS W CONTRAST  Result Date: 03/09/2020 CLINICAL DATA:  Nausea and vomiting EXAM: CT ABDOMEN AND PELVIS WITH CONTRAST TECHNIQUE: Multidetector  CT imaging of the abdomen and pelvis was performed using the standard protocol following bolus administration of intravenous contrast. CONTRAST:  OMNIPAQUE IOHEXOL 300 MG/ML  SOLN COMPARISON:  CT 12/23/2016 FINDINGS: Lower chest: Lung bases demonstrate mild scarring at the left base. No acute consolidation or effusion. Surgical clips at the GE junction. Hepatobiliary: Gallbladder appears slightly dilated. No calcified gallstones. Interval development of moderate intra and extrahepatic biliary dilatation, common duct dilated up to 14 mm. Multiple rounded filling defects within the mid to distal common duct, series 5, image number 45, suspicious for stones. Pancreas: Fatty atrophy without inflammatory change Spleen: Normal in size without focal abnormality. Adrenals/Urinary Tract: Adrenal glands are normal. Kidneys show no hydronephrosis. Slightly thick-walled appearance of urinary bladder without inflammatory change. Streaky hyperdense foci in the bladder with  more focal hyperdensity along the left posterior bladder near the ureteral insertion, suspect that this is due to excreted contrast Stomach/Bowel: Status post partial gastrectomy with small bowel anastomosis within the anterior abdomen. No evidence for obstruction. No acute bowel wall thickening. Negative appendix. Vascular/Lymphatic: Moderate aortic atherosclerosis. No aneurysm. No suspicious nodes. Reproductive: Prostate is unremarkable. Other: No free air or free fluid. Small fat containing umbilical hernia. Musculoskeletal: Scoliosis and postsurgical changes of the lumbar spine. Dysplastic appearing left acetabulum and femoral head and neck, chronic. IMPRESSION: 1. Interval development of moderate intra and extrahepatic biliary dilatation with multiple rounded filling defects within the mid to distal common duct, suspicious for choledocholithiasis. Suggest correlation with LFTs, further evaluation with MRCP may be obtained. 2. Streaky hyperdense foci  within the bladder, suspect that this is due to excreted contrast as opposed to bladder mass. 3. Status post partial gastrectomy with no evidence for an obstruction. 4. Aortic atherosclerosis. Aortic Atherosclerosis (ICD10-I70.0). Electronically Signed   By: Jasmine Pang M.D.   On: 03/15/2020 23:04    EKG: Independently reviewed.  Assessment/Plan Active Problems:   Choledocholithiasis    1. Choledocholithiasis - apparent CBD stones on CT and mild LFT elevations. 1. EDP spoke with [name] 1. Recd MRCP (ordered) to confirm 2. NPO 3. Will see in AM 2. EDP started zosyn empirically, will continue for now. 3. Repeat CBC/CMP in AM 4. IVF: LR at 100.  DVT prophylaxis: SCDs - hold chemo ppx as suspect pt will end up needing ERCP as next step Code Status: Full Family Communication: Wife at bedside Disposition Plan: Home after treatment for choledocholithiasis, getting MRCP to confirm, probably needs ERCP after that. Consults called: EDP spoke with [name] Admission status: Admit to inpatient  Severity of Illness: The appropriate patient status for this patient is INPATIENT. Inpatient status is judged to be reasonable and necessary in order to provide the required intensity of service to ensure the patient's safety. The patient's presenting symptoms, physical exam findings, and initial radiographic and laboratory data in the context of their chronic comorbidities is felt to place them at high risk for further clinical deterioration. Furthermore, it is not anticipated that the patient will be medically stable for discharge from the hospital within 2 midnights of admission. The following factors support the patient status of inpatient.   IP status for management of apparent choledocholithiasis with LFT elevation.  Apparent CBD stones on CT scan.  Suspect he will end up needing ERCP after the ordered MRCP.   * I certify that at the point of admission it is my clinical judgment that the patient  will require inpatient hospital care spanning beyond 2 midnights from the point of admission due to high intensity of service, high risk for further deterioration and high frequency of surveillance required.*    Simora Dingee M. DO [facility]  How to contact the [facility] Attending or Consulting provider 7A - 7P or covering provider during after hours 7P -7A, for this patient?  1. Check the care team in [facility] and look for a) attending/consulting [facility] provider listed and b) the [facility] team listed 2. Log into [email or web address]  Amion Physician Scheduling and messaging for groups and whole hospitals  On call and physician scheduling software for group practices, residents, hospitalists and other medical providers for call, clinic, rotation and shift schedules. On[employer] is a hospital-wide system for scheduling doctors and paging doctors on call. EasyPlot is for scientific plotting and data analysis.  [email or web address]  and use [facility]'s universal password to access. If you do not have the password, please contact the hospital operator.  3. Locate the [facility] provider you are looking for under [facility] and page to a number that you can be directly reached. 4. If you still have difficulty reaching the provider, please page the [facility] (Director on Call) for the Hospitalists listed on amion for assistance.  03/31/2020, 12:49 AM

## 2020-03-13 NOTE — Anesthesia Procedure Notes (Signed)
Procedure Name: Intubation Date/Time: 03/10/2020 1:52 PM Performed by: Dorthea Cove, CRNA Pre-anesthesia Checklist: Patient identified, Emergency Drugs available, Suction available and Patient being monitored Patient Re-evaluated:Patient Re-evaluated prior to induction Oxygen Delivery Method: Circle system utilized Preoxygenation: Pre-oxygenation with 100% oxygen Induction Type: IV induction Ventilation: Mask ventilation without difficulty and Oral airway inserted - appropriate to patient size Laryngoscope Size: Mac and 4 Grade View: Grade I Tube type: Oral Tube size: 7.5 mm Number of attempts: 1 Airway Equipment and Method: Stylet and Oral airway Placement Confirmation: ETT inserted through vocal cords under direct vision,  positive ETCO2 and breath sounds checked- equal and bilateral Secured at: 23 cm Tube secured with: Tape Dental Injury: Teeth and Oropharynx as per pre-operative assessment

## 2020-03-13 NOTE — Anesthesia Preprocedure Evaluation (Signed)
Anesthesia Evaluation  Patient identified by MRN, date of birth, ID band Patient awake    Reviewed: Allergy & Precautions, NPO status , Patient's Chart, lab work & pertinent test results  Airway Mallampati: I       Dental  (+) Edentulous Upper, Edentulous Lower   Pulmonary former smoker,    breath sounds clear to auscultation       Cardiovascular negative cardio ROS   Rhythm:Regular Rate:Normal     Neuro/Psych PSYCHIATRIC DISORDERS Depression    GI/Hepatic Neg liver ROS, GERD  ,  Endo/Other  negative endocrine ROS  Renal/GU negative Renal ROS     Musculoskeletal negative musculoskeletal ROS (+)   Abdominal Normal abdominal exam  (+)   Peds  Hematology negative hematology ROS (+)   Anesthesia Other Findings   Reproductive/Obstetrics                            Anesthesia Physical Anesthesia Plan  ASA: II  Anesthesia Plan: General   Post-op Pain Management:    Induction: Intravenous  PONV Risk Score and Plan: 2 and Ondansetron and Dexamethasone  Airway Management Planned: Oral ETT  Additional Equipment: None  Intra-op Plan:   Post-operative Plan: Extubation in OR  Informed Consent:   Plan Discussed with: CRNA  Anesthesia Plan Comments: (Lab Results      Component                Value               Date                      WBC                      14.6 (H)            03/28/2020                HGB                      14.4                03/18/2020                HCT                      41.0                03/15/2020                MCV                      85.4                03/31/2020                PLT                      173                 03/12/2020            EKG: normal sinus rhythm, prolonged PR interval. )        Anesthesia Quick Evaluation

## 2020-03-13 NOTE — Consult Note (Addendum)
Reason for Consult: Choledocholtihasis Referring Physician: [facility]  [name]. HPI: This is a 69 year old male with a PMH of IDA, GERD, and s/p Billroth II admitted for choledocholithiasis.  He states that his symptoms started acutely yesterday afternoon.  The pain was intense and associated with nausea and vomiting.  This prompted him to present to the ER and he was noted to have abnormal liver enzymes.  Further evaluation with a CT scan of the abdomen revealed CBD stones and dilated intrahepatic and extrahepatic bile ducts.  His last EGD/colonoscopy was in 2017 and it was performed for IDA.  He was noted to have a Billroth II at that time.  Past Medical History:  Diagnosis Date  . Anemia   . GERD (gastroesophageal reflux disease)     Past Surgical History:  Procedure Laterality Date  . LUMBAR FUSION    . STOMACH SURGERY      Family History  Problem Relation Age of Onset  . Heart attack Mother   . Emphysema Father   . Cancer Father        lung    Social History:  reports that he quit smoking about 21 years ago. He has never used smokeless tobacco. He reports that he does not drink alcohol and does not use drugs.  Allergies:  Allergies  Allergen Reactions  . Other Itching  . Demerol [Meperidine] Rash    Medications:  Scheduled:  Continuous: . lactated ringers 100 mL/hr at 03/25/2020 0325  . piperacillin-tazobactam (ZOSYN)  IV 3.375 g (03/31/2020 0510)    Results for orders placed or performed during the hospital encounter of 03/05/2020 (from the past 24 hour(s))  Lipase, blood     Status: None   Collection Time: 03/16/2020  2:52 PM  Result Value Ref Range   Lipase 20 11 - 51 U/L  Comprehensive metabolic panel     Status: Abnormal   Collection Time: 03/06/2020  2:52 PM  Result Value Ref Range   Sodium 138 135 - 145 mmol/L   Potassium 4.3 3.5 - 5.1 mmol/L   Chloride 104 98 - 111 mmol/L   CO2 22 22 - 32 mmol/L   Glucose, Bld 133 (H) 70 - 99 mg/dL   BUN 9  8 - 23 mg/dL   Creatinine, Ser 2.77 0.61 - 1.24 mg/dL   Calcium 8.8 (L) 8.9 - 10.3 mg/dL   Total Protein 6.4 (L) 6.5 - 8.1 g/dL   Albumin 3.8 3.5 - 5.0 g/dL   AST 824 (H) 15 - 41 U/L   ALT 93 (H) 0 - 44 U/L   Alkaline Phosphatase 124 38 - 126 U/L   Total Bilirubin 2.3 (H) 0.3 - 1.2 mg/dL   GFR, Estimated >23 >53 mL/min   Anion gap 12 5 - 15  CBC     Status: Abnormal   Collection Time: 04/02/2020  2:52 PM  Result Value Ref Range   WBC 11.7 (H) 4.0 - 10.5 K/uL   RBC 5.32 4.22 - 5.81 MIL/uL   Hemoglobin 15.6 13.0 - 17.0 g/dL   HCT 61.4 43.1 - 54.0 %   MCV 90.0 80.0 - 100.0 fL   MCH 29.3 26.0 - 34.0 pg   MCHC 32.6 30.0 - 36.0 g/dL   RDW 08.6 76.1 - 95.0 %   Platelets 210 150 - 400 K/uL   nRBC 0.0 0.0 - 0.2 %  Urinalysis, Routine w reflex microscopic Urine, Clean Catch     Status: Abnormal   Collection  Time: 03/22/2020  8:05 PM  Result Value Ref Range   Color, Urine AMBER (A) YELLOW   APPearance HAZY (A) CLEAR   Specific Gravity, Urine 1.024 1.005 - 1.030   pH 5.0 5.0 - 8.0   Glucose, UA NEGATIVE NEGATIVE mg/dL   Hgb urine dipstick SMALL (A) NEGATIVE   Bilirubin Urine SMALL (A) NEGATIVE   Ketones, ur 20 (A) NEGATIVE mg/dL   Protein, ur 263 (A) NEGATIVE mg/dL   Nitrite NEGATIVE NEGATIVE   Leukocytes,Ua NEGATIVE NEGATIVE   RBC / HPF 6-10 0 - 5 RBC/hpf   WBC, UA 0-5 0 - 5 WBC/hpf   Bacteria, UA NONE SEEN NONE SEEN   Squamous Epithelial / LPF 0-5 0 - 5   Mucus PRESENT   Resp Panel by RT-PCR (Flu A&B, Covid) Nasopharyngeal Swab     Status: None   Collection Time: 04/02/2020 12:30 AM   Specimen: Nasopharyngeal Swab; Nasopharyngeal(NP) swabs in vial transport medium  Result Value Ref Range   SARS Coronavirus 2 by RT PCR NEGATIVE NEGATIVE   Influenza A by PCR NEGATIVE NEGATIVE   Influenza B by PCR NEGATIVE NEGATIVE  CBC     Status: Abnormal   Collection Time: 03/14/2020  4:53 AM  Result Value Ref Range   WBC 14.6 (H) 4.0 - 10.5 K/uL   RBC 4.80 4.22 - 5.81 MIL/uL   Hemoglobin 14.4  13.0 - 17.0 g/dL   HCT 78.5 88.5 - 02.7 %   MCV 85.4 80.0 - 100.0 fL   MCH 30.0 26.0 - 34.0 pg   MCHC 35.1 30.0 - 36.0 g/dL   RDW 74.1 28.7 - 86.7 %   Platelets 173 150 - 400 K/uL   nRBC 0.0 0.0 - 0.2 %  Comprehensive metabolic panel     Status: Abnormal   Collection Time: 03/09/2020  4:53 AM  Result Value Ref Range   Sodium 138 135 - 145 mmol/L   Potassium 4.4 3.5 - 5.1 mmol/L   Chloride 106 98 - 111 mmol/L   CO2 21 (L) 22 - 32 mmol/L   Glucose, Bld 136 (H) 70 - 99 mg/dL   BUN 11 8 - 23 mg/dL   Creatinine, Ser 6.72 0.61 - 1.24 mg/dL   Calcium 8.5 (L) 8.9 - 10.3 mg/dL   Total Protein 5.9 (L) 6.5 - 8.1 g/dL   Albumin 3.3 (L) 3.5 - 5.0 g/dL   AST 094 (H) 15 - 41 U/L   ALT 178 (H) 0 - 44 U/L   Alkaline Phosphatase 126 38 - 126 U/L   Total Bilirubin 5.3 (H) 0.3 - 1.2 mg/dL   GFR, Estimated >70 >96 mL/min   Anion gap 11 5 - 15     CT ABDOMEN PELVIS W CONTRAST  Result Date: 04/03/2020 CLINICAL DATA:  Nausea and vomiting EXAM: CT ABDOMEN AND PELVIS WITH CONTRAST TECHNIQUE: Multidetector CT imaging of the abdomen and pelvis was performed using the standard protocol following bolus administration of intravenous contrast. CONTRAST:  OMNIPAQUE IOHEXOL 300 MG/ML  SOLN COMPARISON:  CT 12/23/2016 FINDINGS: Lower chest: Lung bases demonstrate mild scarring at the left base. No acute consolidation or effusion. Surgical clips at the GE junction. Hepatobiliary: Gallbladder appears slightly dilated. No calcified gallstones. Interval development of moderate intra and extrahepatic biliary dilatation, common duct dilated up to 14 mm. Multiple rounded filling defects within the mid to distal common duct, series 5, image number 45, suspicious for stones. Pancreas: Fatty atrophy without inflammatory change Spleen: Normal in size without focal abnormality. Adrenals/Urinary  Tract: Adrenal glands are normal. Kidneys show no hydronephrosis. Slightly thick-walled appearance of urinary bladder without  inflammatory change. Streaky hyperdense foci in the bladder with more focal hyperdensity along the left posterior bladder near the ureteral insertion, suspect that this is due to excreted contrast Stomach/Bowel: Status post partial gastrectomy with small bowel anastomosis within the anterior abdomen. No evidence for obstruction. No acute bowel wall thickening. Negative appendix. Vascular/Lymphatic: Moderate aortic atherosclerosis. No aneurysm. No suspicious nodes. Reproductive: Prostate is unremarkable. Other: No free air or free fluid. Small fat containing umbilical hernia. Musculoskeletal: Scoliosis and postsurgical changes of the lumbar spine. Dysplastic appearing left acetabulum and femoral head and neck, chronic. IMPRESSION: 1. Interval development of moderate intra and extrahepatic biliary dilatation with multiple rounded filling defects within the mid to distal common duct, suspicious for choledocholithiasis. Suggest correlation with LFTs, further evaluation with MRCP may be obtained. 2. Streaky hyperdense foci within the bladder, suspect that this is due to excreted contrast as opposed to bladder mass. 3. Status post partial gastrectomy with no evidence for an obstruction. 4. Aortic atherosclerosis. Aortic Atherosclerosis (ICD10-I70.0). Electronically Signed   By: Jasmine Pang M.D.   On: 03/31/20 23:04    ROS:  As stated above in the HPI otherwise negative.  Blood pressure 99/60, pulse 77, temperature 99.5 F (37.5 C), temperature source Oral, resp. rate 16, height 5\' 6"  (1.676 m), weight 69.9 kg, SpO2 93 %.    PE: Gen: NAD, Alert and Oriented HEENT:  /AT, EOMI Neck: Supple, no LAD Lungs: CTA Bilaterally CV: RRR without M/G/R ABD: Soft, mild diffuse pain, no rebound or rigidity, +BS Ext: No C/C/E  Assessment/Plan: 1) Choledocholithiasis. 2) S/p Billroth II. 3) Abnormal liver enzymes.   The Billroth II will make this procedure technically challenging.  An attempt to locate the  ampulla and perform a stone extraction will be made.  He is aware of the technical difficulty of the case.  Plan: 1) ERCP today.  Shaylinn Hladik D 03/09/2020, 7:42 AM

## 2020-03-13 NOTE — Anesthesia Postprocedure Evaluation (Signed)
Anesthesia Post Note  Patient: Jorge Evans.  Procedure(s) Performed: ENDOSCOPIC RETROGRADE CHOLANGIOPANCREATOGRAPHY (ERCP) (N/A ) BILIARY STENT PLACEMENT     Patient location during evaluation: PACU Anesthesia Type: General Level of consciousness: awake and alert Pain management: pain level controlled Vital Signs Assessment: post-procedure vital signs reviewed and stable Respiratory status: spontaneous breathing, nonlabored ventilation, respiratory function stable and patient connected to nasal cannula oxygen Cardiovascular status: blood pressure returned to baseline and stable Postop Assessment: no apparent nausea or vomiting Anesthetic complications: no   No complications documented.  Last Vitals:  Vitals:   03/21/2020 1600 03/06/2020 1723  BP: (!) 105/32 (!) 123/55  Pulse: 69 63  Resp: 14 16  Temp:  37 C  SpO2: 93% 96%    Last Pain:  Vitals:   03/15/2020 1723  TempSrc: Oral  PainSc: 0-No pain                 Shelton Silvas

## 2020-03-13 NOTE — Progress Notes (Signed)
PROGRESS NOTE    [name].  IRW:[id number] DOB: 03-12-1951 DOA: April 07, 2020 PCP: Salley Scarlet, MD    Brief Narrative:  Kinston Magnan. is a 69 year old male with past medical history significant for GERD who presented to the ED with nausea/vomiting and abdominal pain.  Patient reports symptoms onset 3 days ago, slightly improved but abruptly worsened overnight.  Patient reports vomitus is nonbloody nonbilious.  Continues with bowel movements.  Denies fevers, chills, night sweats, no chest pain, no palpitations, no shortness of breath, no weakness, no fatigue.  In the ED, temperature 98.2, HR 56, RR 20, BP 105/51, SPO2 98% on room air.  Sodium 138, potassium 4.3, glucose 133, creatinine 0.94, lipase 20, AST 137, ALT 93, total bilirubin 2.3.  WBC count 11.7, hemoglobin 15.6, platelets 210.  Covid-19/influenza A/B negative.  Urinalysis unremarkable.  CT abdomen/pelvis with contrast with interval development of moderate intra-/extrahepatic biliary dilation with multiple round filling defects mid/distal common duct suspicious for choledocholithiasis.  GI, [name] was consulted.  Recommended MRCP and hospitalist admission for further evaluation and management.   Assessment & Plan:   Active Problems:   Choledocholithiasis   Choledocholithiasis Patient presenting with progressive abdominal pain.  Noted to have elevated transaminitis and total bilirubin.  CT abdomen/pelvis with IV contrast with intra-/extrahepatic biliary duct dilation with filling defects within the common duct concerning for choledocholithiasis. --Gastroenterology following, [name]; appreciate assistance --AST 137>158 --ALT 93>178 --Tbili 2.3>5.3 --WBC 11.7>14.6 --Blood cultures x2: Pending --Continue empiric antibiotics with Zosyn --Pain control and antiemetics --N.p.o. --IVF with LR at 75 mL's per hour --ERCP planned this afternoon; although Billroth II will make this procedure challenging per GI to  locate ampulla and to perform stone extraction --Continue to follow LFTs and CBC daily  GERD On Carafate 1 g 4 times daily at home. --Hold while n.p.o.  Iron deficiency anemia On ferrous sulfate 325 mg daily at home. --Hold while n.p.o.   DVT prophylaxis: SCDs, holding chemical DVT prophylaxis for planned ERCP today Code Status: Full code Family Communication: Spouse present at bedside  Disposition Plan:  Status is: Inpatient  Remains inpatient appropriate because:Ongoing active pain requiring inpatient pain management, Ongoing diagnostic testing needed not appropriate for outpatient work up, IV treatments appropriate due to intensity of illness or inability to take PO and Inpatient level of care appropriate due to severity of illness   Dispo: The patient is from: Home              Anticipated d/c is to: Home              Anticipated d/c date is: 2 days              Patient currently is not medically stable to d/c.   Consultants:   Gastroenterology, [name]  Procedures:   ERCP: Pending  Antimicrobials:   Zosyn 12/9>>   Subjective: Patient seen and examined bedside, resting comfortably.  Spouse present.  Currently abdominal pain controlled with pain medication.  Awaiting ERCP later today.  No other questions or concerns at this time.  Denies headache, no fever/chills/night sweats, no current vomiting/diarrhea, no chest pain, no palpitations, no shortness of breath, no cough/congestion, no weakness, no fatigue, no paresthesias.  No acute concerns this morning per nursing staff.  Objective: Vitals:   03/06/2020 0230 04/01/2020 0300 03/15/2020 0330 03/04/2020 0410  BP: 117/62 115/66 118/65 99/60  Pulse: 90 90 88 77  Resp: 15 (!) 21 18 16   Temp:  99.9 F (37.7 C) 99.5 F (37.5 C)  TempSrc:   Oral Oral  SpO2: 96% 94% 95% 93%  Weight:      Height:        Intake/Output Summary (Last 24 hours) at 03/06/2020 1045 Last data filed at 03/18/2020 0800 Gross per 24 hour   Intake 1103.53 ml  Output 375 ml  Net 728.53 ml   Filed Weights   03/29/2020 1442  Weight: 69.9 kg    Examination:  General exam: Appears calm and comfortable  Respiratory system: Clear to auscultation. Respiratory effort normal. Cardiovascular system: S1 & S2 heard, RRR. No JVD, murmurs, rubs, gallops or clicks. No pedal edema. Gastrointestinal system: Abdomen is nondistended, soft, mild RUQ TTP. No organomegaly or masses felt. Normal bowel sounds heard. Central nervous system: Alert and oriented. No focal neurological deficits. Extremities: Symmetric 5 x 5 power. Skin: No rashes, lesions or ulcers Psychiatry: Judgement and insight appear normal. Mood & affect appropriate.     Data Reviewed: I have personally reviewed following labs and imaging studies  CBC: Recent Labs  Lab 03/11/2020 1452 03/07/2020 0453  WBC 11.7* 14.6*  HGB 15.6 14.4  HCT 47.9 41.0  MCV 90.0 85.4  PLT 210 173   Basic Metabolic Panel: Recent Labs  Lab 03/22/2020 1452 03/08/2020 0453  NA 138 138  K 4.3 4.4  CL 104 106  CO2 22 21*  GLUCOSE 133* 136*  BUN 9 11  CREATININE 0.94 1.03  CALCIUM 8.8* 8.5*   GFR: Estimated Creatinine Clearance: 60.2 mL/min (by C-G formula based on SCr of 1.03 mg/dL). Liver Function Tests: Recent Labs  Lab 03/23/2020 1452 04/01/2020 0453  AST 137* 158*  ALT 93* 178*  ALKPHOS 124 126  BILITOT 2.3* 5.3*  PROT 6.4* 5.9*  ALBUMIN 3.8 3.3*   Recent Labs  Lab 03/31/2020 1452  LIPASE 20   No results for input(s): AMMONIA in the last 168 hours. Coagulation Profile: No results for input(s): INR, PROTIME in the last 168 hours. Cardiac Enzymes: No results for input(s): CKTOTAL, CKMB, CKMBINDEX, TROPONINI in the last 168 hours. BNP (last 3 results) No results for input(s): PROBNP in the last 8760 hours. HbA1C: No results for input(s): HGBA1C in the last 72 hours. CBG: No results for input(s): GLUCAP in the last 168 hours. Lipid Profile: No results for input(s): CHOL,  HDL, LDLCALC, TRIG, CHOLHDL, LDLDIRECT in the last 72 hours. Thyroid Function Tests: No results for input(s): TSH, T4TOTAL, FREET4, T3FREE, THYROIDAB in the last 72 hours. Anemia Panel: No results for input(s): VITAMINB12, FOLATE, FERRITIN, TIBC, IRON, RETICCTPCT in the last 72 hours. Sepsis Labs: No results for input(s): PROCALCITON, LATICACIDVEN in the last 168 hours.  Recent Results (from the past 240 hour(s))  Resp Panel by RT-PCR (Flu A&B, Covid) Nasopharyngeal Swab     Status: None   Collection Time: 03/08/2020 12:30 AM   Specimen: Nasopharyngeal Swab; Nasopharyngeal(NP) swabs in vial transport medium  Result Value Ref Range Status   SARS Coronavirus 2 by RT PCR NEGATIVE NEGATIVE Final    Comment: (NOTE) SARS-CoV-2 target nucleic acids are NOT DETECTED.  The SARS-CoV-2 RNA is generally detectable in upper respiratory specimens during the acute phase of infection. The lowest concentration of SARS-CoV-2 viral copies this assay can detect is 138 copies/mL. A negative result does not preclude SARS-Cov-2 infection and should not be used as the sole basis for treatment or other patient management decisions. A negative result may occur with  improper specimen collection/handling, submission of specimen other than  nasopharyngeal swab, presence of viral mutation(s) within the areas targeted by this assay, and inadequate number of viral copies(<138 copies/mL). A negative result must be combined with clinical observations, patient history, and epidemiological information. The expected result is Negative.  Fact Sheet for Patients:  BloggerCourse.com  Fact Sheet for Healthcare Providers:  SeriousBroker.it  This test is no t yet approved or cleared by the [place] FDA and  has been authorized for detection and/or diagnosis of SARS-CoV-2 by FDA under an Emergency Use Authorization (EUA). This EUA will remain  in effect (meaning this  test can be used) for the duration of the COVID-19 declaration under Section 564(b)(1) of the Act, 21 U.S.C.section 360bbb-3(b)(1), unless the authorization is terminated  or revoked sooner.       Influenza A by PCR NEGATIVE NEGATIVE Final   Influenza B by PCR NEGATIVE NEGATIVE Final    Comment: (NOTE) The Xpert Xpress SARS-CoV-2/FLU/RSV plus assay is intended as an aid in the diagnosis of influenza from Nasopharyngeal swab specimens and should not be used as a sole basis for treatment. Nasal washings and aspirates are unacceptable for Xpert Xpress SARS-CoV-2/FLU/RSV testing.  Fact Sheet for Patients: BloggerCourse.com  Fact Sheet for Healthcare Providers: SeriousBroker.it  This test is not yet approved or cleared by the [place] FDA and has been authorized for detection and/or diagnosis of SARS-CoV-2 by FDA under an Emergency Use Authorization (EUA). This EUA will remain in effect (meaning this test can be used) for the duration of the COVID-19 declaration under Section 564(b)(1) of the Act, 21 U.S.C. section 360bbb-3(b)(1), unless the authorization is terminated or revoked.  Performed at [facility], [address] [place], [place] [address]          Radiology Studies: CT ABDOMEN PELVIS W CONTRAST  Result Date: 03/16/2020 CLINICAL DATA:  Nausea and vomiting EXAM: CT ABDOMEN AND PELVIS WITH CONTRAST TECHNIQUE: Multidetector CT imaging of the abdomen and pelvis was performed using the standard protocol following bolus administration of intravenous contrast. CONTRAST:  OMNIPAQUE IOHEXOL 300 MG/ML  SOLN COMPARISON:  CT 12/23/2016 FINDINGS: Lower chest: Lung bases demonstrate mild scarring at the left base. No acute consolidation or effusion. Surgical clips at the GE junction. Hepatobiliary: Gallbladder appears slightly dilated. No calcified gallstones. Interval development of moderate intra and extrahepatic  biliary dilatation, common duct dilated up to 14 mm. Multiple rounded filling defects within the mid to distal common duct, series 5, image number 45, suspicious for stones. Pancreas: Fatty atrophy without inflammatory change Spleen: Normal in size without focal abnormality. Adrenals/Urinary Tract: Adrenal glands are normal. Kidneys show no hydronephrosis. Slightly thick-walled appearance of urinary bladder without inflammatory change. Streaky hyperdense foci in the bladder with more focal hyperdensity along the left posterior bladder near the ureteral insertion, suspect that this is due to excreted contrast Stomach/Bowel: Status post partial gastrectomy with small bowel anastomosis within the anterior abdomen. No evidence for obstruction. No acute bowel wall thickening. Negative appendix. Vascular/Lymphatic: Moderate aortic atherosclerosis. No aneurysm. No suspicious nodes. Reproductive: Prostate is unremarkable. Other: No free air or free fluid. Small fat containing umbilical hernia. Musculoskeletal: Scoliosis and postsurgical changes of the lumbar spine. Dysplastic appearing left acetabulum and femoral head and neck, chronic. IMPRESSION: 1. Interval development of moderate intra and extrahepatic biliary dilatation with multiple rounded filling defects within the mid to distal common duct, suspicious for choledocholithiasis. Suggest correlation with LFTs, further evaluation with MRCP may be obtained. 2. Streaky hyperdense foci within the bladder, suspect that this is  due to excreted contrast as opposed to bladder mass. 3. Status post partial gastrectomy with no evidence for an obstruction. 4. Aortic atherosclerosis. Aortic Atherosclerosis (ICD10-I70.0). Electronically Signed   By: Jasmine PangKim  Fujinaga M.D.   On: 03/05/2020 23:04        Scheduled Meds: Continuous Infusions: . sodium chloride    . lactated ringers 100 mL/hr at 03/22/2020 0325  . piperacillin-tazobactam (ZOSYN)  IV 3.375 g (03/27/2020 0510)      LOS: 0 days    Time spent: 38 minutes spent on chart review, discussion with nursing staff, consultants, updating family and interview/physical exam; more than 50% of that time was spent in counseling and/or coordination of care.    Alvira PhilipsEric J [place], DO [facility] Available via Epic secure chat 7am-7pm After these hours, please refer to coverage provider listed on [email or web address] 03/12/2020, 10:45 AM

## 2020-03-14 DIAGNOSIS — K8050 Calculus of bile duct without cholangitis or cholecystitis without obstruction: Secondary | ICD-10-CM

## 2020-03-14 DIAGNOSIS — K219 Gastro-esophageal reflux disease without esophagitis: Secondary | ICD-10-CM

## 2020-03-14 LAB — COMPREHENSIVE METABOLIC PANEL
ALT: 136 U/L — ABNORMAL HIGH (ref 0–44)
AST: 84 U/L — ABNORMAL HIGH (ref 15–41)
Albumin: 3.1 g/dL — ABNORMAL LOW (ref 3.5–5.0)
Alkaline Phosphatase: 107 U/L (ref 38–126)
Anion gap: 10 (ref 5–15)
BUN: 14 mg/dL (ref 8–23)
CO2: 21 mmol/L — ABNORMAL LOW (ref 22–32)
Calcium: 8.5 mg/dL — ABNORMAL LOW (ref 8.9–10.3)
Chloride: 106 mmol/L (ref 98–111)
Creatinine, Ser: 1.07 mg/dL (ref 0.61–1.24)
GFR, Estimated: >60 mL/min (ref >60)
Glucose, Bld: 146 mg/dL — ABNORMAL HIGH (ref 70–99)
Potassium: 4.6 mmol/L (ref 3.5–5.1)
Sodium: 137 mmol/L (ref 135–145)
Total Bilirubin: 5.9 mg/dL — ABNORMAL HIGH (ref 0.3–1.2)
Total Protein: 5.7 g/dL — ABNORMAL LOW (ref 6.5–8.1)

## 2020-03-14 LAB — CBC
HCT: 40.4 % (ref 39.0–52.0)
Hemoglobin: 13.5 g/dL (ref 13.0–17.0)
MCH: 29.2 pg (ref 26.0–34.0)
MCHC: 33.4 g/dL (ref 30.0–36.0)
MCV: 87.3 fL (ref 80.0–100.0)
Platelets: 132 10*3/uL — ABNORMAL LOW (ref 150–400)
RBC: 4.63 MIL/uL (ref 4.22–5.81)
RDW: 12.8 % (ref 11.5–15.5)
WBC: 10 10*3/uL (ref 4.0–10.5)
nRBC: 0 % (ref 0.0–0.2)

## 2020-03-14 LAB — MAGNESIUM: Magnesium: 2.1 mg/dL (ref 1.7–2.4)

## 2020-03-14 MED ORDER — SUCRALFATE 1 G PO TABS
1.0000 g | ORAL_TABLET | Freq: Three times a day (TID) | ORAL | Status: DC
  Administered 2020-03-14 – 2020-03-26 (×34): 1 g via ORAL
  Filled 2020-03-14 (×40): qty 1

## 2020-03-14 MED ORDER — SENNOSIDES-DOCUSATE SODIUM 8.6-50 MG PO TABS
1.0000 | ORAL_TABLET | Freq: Once | ORAL | Status: DC
  Filled 2020-03-14 (×3): qty 1

## 2020-03-14 MED ORDER — OXYCODONE HCL 5 MG PO TABS
5.0000 mg | ORAL_TABLET | Freq: Four times a day (QID) | ORAL | Status: DC | PRN
  Administered 2020-03-14 – 2020-03-18 (×3): 5 mg via ORAL
  Filled 2020-03-14 (×3): qty 1

## 2020-03-14 MED ORDER — PROMETHAZINE HCL 25 MG/ML IJ SOLN
12.5000 mg | Freq: Four times a day (QID) | INTRAMUSCULAR | Status: DC | PRN
  Administered 2020-03-14 – 2020-03-15 (×2): 12.5 mg via INTRAVENOUS
  Filled 2020-03-14 (×2): qty 1

## 2020-03-14 MED ORDER — PANTOPRAZOLE SODIUM 40 MG PO TBEC
40.0000 mg | DELAYED_RELEASE_TABLET | Freq: Every day | ORAL | Status: DC
  Administered 2020-03-14 – 2020-03-20 (×6): 40 mg via ORAL
  Filled 2020-03-14 (×7): qty 1

## 2020-03-14 NOTE — Progress Notes (Addendum)
Patient ID: Jorge Evans., male   DOB: 1950-11-27, 69 y.o.   MRN: [id number]   GI Progress Note Covering for Drs. Clinton   Subjective   Day # 2  CC: common bile duct stones  Status post ERCP with stent placement yesterday-3 large common bile duct stones noted, unable to do sphincterotomy and stone extraction secondary to his anatomy status post Billroth II.  WBC 10, hemoglobin 13.5 T bili 5.9/alk phos 107/ALT 136/AST 84-stable  Patient says he feels okay, was able to tolerate some solid food last night though says he did not eat very much. He is complaining of indigestion, nausea this morning, says usually Carafate will take care of this.  No significant abdominal pain.  No BM in 2 days.  Both he and his wife are very concerned about possibility of recurrence of his severe pain.    Objective   Vital signs in last 24 hours: Temp:  [97.7 F (36.5 C)-98.8 F (37.1 C)] 98.7 F (37.1 C) (12/11 0529) Pulse Rate:  [63-73] 73 (12/11 0529) Resp:  [13-18] 18 (12/11 0529) BP: (102-144)/(32-72) 140/72 (12/11 0529) SpO2:  [93 %-97 %] 96 % (12/11 0529) Last BM Date: 04/02/2020 General:    Elderly white male in NAD, wife at bedside Heart:  Regular rate and rhythm; no murmurs Lungs: Respirations even and unlabored, lungs CTA bilaterally Abdomen:  Soft, no focal tenderness and nondistended. Normal bowel sounds. Extremities:  Without edema. Neurologic:  Alert and oriented,  grossly normal neurologically. Psych:  Cooperative. Normal mood and affect.  Intake/Output from previous day: 12/10 0701 - 12/11 0700 In: 1039.7 [I.V.:1000; IV Piggyback:39.7] Out: 675 [Urine:675] Intake/Output this shift: No intake/output data recorded.  Lab Results: Recent Labs    03/16/2020 1452 03/09/2020 0453 03/14/20 0209  WBC 11.7* 14.6* 10.0  HGB 15.6 14.4 13.5  HCT 47.9 41.0 40.4  PLT 210 173 132*   BMET Recent Labs    03/24/2020 1452 03/12/2020 0453 03/14/20 0209  NA 138 138 137  K 4.3 4.4  4.6  CL 104 106 106  CO2 22 21* 21*  GLUCOSE 133* 136* 146*  BUN $Re'9 11 14  'eUN$ CREATININE 0.94 1.03 1.07  CALCIUM 8.8* 8.5* 8.5*   LFT Recent Labs    03/14/20 0209  PROT 5.7*  ALBUMIN 3.1*  AST 84*  ALT 136*  ALKPHOS 107  BILITOT 5.9*   PT/INR No results for input(s): LABPROT, INR in the last 72 hours.  Studies/Results: CT ABDOMEN PELVIS W CONTRAST  Result Date: 03/22/2020 CLINICAL DATA:  Nausea and vomiting EXAM: CT ABDOMEN AND PELVIS WITH CONTRAST TECHNIQUE: Multidetector CT imaging of the abdomen and pelvis was performed using the standard protocol following bolus administration of intravenous contrast. CONTRAST:  160mL OMNIPAQUE IOHEXOL 300 MG/ML  SOLN COMPARISON:  CT 12/23/2016 FINDINGS: Lower chest: Lung bases demonstrate mild scarring at the left base. No acute consolidation or effusion. Surgical clips at the GE junction. Hepatobiliary: Gallbladder appears slightly dilated. No calcified gallstones. Interval development of moderate intra and extrahepatic biliary dilatation, common duct dilated up to 14 mm. Multiple rounded filling defects within the mid to distal common duct, series 5, image number 45, suspicious for stones. Pancreas: Fatty atrophy without inflammatory change Spleen: Normal in size without focal abnormality. Adrenals/Urinary Tract: Adrenal glands are normal. Kidneys show no hydronephrosis. Slightly thick-walled appearance of urinary bladder without inflammatory change. Streaky hyperdense foci in the bladder with more focal hyperdensity along the left posterior bladder near the ureteral insertion, suspect that  this is due to excreted contrast Stomach/Bowel: Status post partial gastrectomy with small bowel anastomosis within the anterior abdomen. No evidence for obstruction. No acute bowel wall thickening. Negative appendix. Vascular/Lymphatic: Moderate aortic atherosclerosis. No aneurysm. No suspicious nodes. Reproductive: Prostate is unremarkable. Other: No free air or free  fluid. Small fat containing umbilical hernia. Musculoskeletal: Scoliosis and postsurgical changes of the lumbar spine. Dysplastic appearing left acetabulum and femoral head and neck, chronic. IMPRESSION: 1. Interval development of moderate intra and extrahepatic biliary dilatation with multiple rounded filling defects within the mid to distal common duct, suspicious for choledocholithiasis. Suggest correlation with LFTs, further evaluation with MRCP may be obtained. 2. Streaky hyperdense foci within the bladder, suspect that this is due to excreted contrast as opposed to bladder mass. 3. Status post partial gastrectomy with no evidence for an obstruction. 4. Aortic atherosclerosis. Aortic Atherosclerosis (ICD10-I70.0). Electronically Signed   By: Donavan Foil M.D.   On: 03/15/2020 23:04   DG ERCP BILIARY & PANCREATIC DUCTS  Result Date: 03/19/2020 CLINICAL DATA:  69 year old male undergoing ERCP for choledocholithiasis EXAM: ERCP TECHNIQUE: Multiple spot images obtained with the fluoroscopic device and submitted for interpretation post-procedure. FLUOROSCOPY TIME:  Fluoroscopy Time:  3 minutes 36 seconds Radiation Exposure Index (if provided by the fluoroscopic device): 46.24 mGy COMPARISON:  CT abdomen/pelvis 03/18/2020 FINDINGS: A total of 4 intraoperative saved images are submitted for review. The images demonstrate a flexible duodenal scope in the descending duodenum with wire cannulation of the common bile duct. Cholangiogram demonstrates intra and extrahepatic biliary ductal dilatation. Multiple filling defects in the distal common bile duct consistent with choledocholithiasis. Subsequent images demonstrate sphincterotomy and balloon sweeping of the duct. IMPRESSION: 1. Choledocholithiasis. 2. ERCP with sphincterotomy and presumed sweeping of the common duct. These images were submitted for radiologic interpretation only. Please see the procedural report for the amount of contrast and the fluoroscopy  time utilized. Electronically Signed   By: Jacqulynn Cadet M.D.   On: 03/09/2020 15:47       Assessment / Plan:    #40 69 year old white male admitted with acute intense upper abdominal pain with nausea and vomiting. Found on CT imaging to have dilated intrahepatic and extrahepatic bile ducts, and choledocholithiasis. No definite gallstones in the gallbladder on CT  Patient is status post remote Billroth II. He underwent ERCP yesterday per Dr. Benson Norway, choledocholithiasis identified, stones unable to be removed, technically difficult procedure due to Billroth II anatomy.  Biliary stent was placed.  LFTs are stable today but have not improved as yet  Plan:  Patient does not feel up to discharge yet today. Should be ready by Sunday Repeat labs in a.m. Laxative today per patient request.  Protonix 40 mg qd in addition to Carafate Dr. Benson Norway to arrange for repeat ERCP and stone extraction. We will follow up in a.m., patient feeling better and LFTs improved, can be tomorrow   LOS: 1 day   Amy Esterwood PA-C 03/14/2020, 10:26 AM     Attending Physician Note   I have taken an interval history, reviewed the chart and examined the patient. I agree with the Advanced Practitioner's note, impression and recommendations.   Lucio Edward, MD [facility]

## 2020-03-14 NOTE — Progress Notes (Signed)
PROGRESS NOTE    Jorge Evans.  JXB:[id number] DOB: Apr 05, 1950 DOA: 03/17/2020 PCP: Salley Scarlet, MD    Brief Narrative:  Sanuel Evans. is a 69 year old male with past medical history significant for GERD who presented to the ED with nausea/vomiting and abdominal pain.  Patient reports symptoms onset 3 days ago, slightly improved but abruptly worsened overnight.  Patient reports vomitus is nonbloody nonbilious.  Continues with bowel movements.  Denies fevers, chills, night sweats, no chest pain, no palpitations, no shortness of breath, no weakness, no fatigue.  In the ED, temperature 98.2, HR 56, RR 20, BP 105/51, SPO2 98% on room air.  Sodium 138, potassium 4.3, glucose 133, creatinine 0.94, lipase 20, AST 137, ALT 93, total bilirubin 2.3.  WBC count 11.7, hemoglobin 15.6, platelets 210.  Covid-19/influenza A/B negative.  Urinalysis unremarkable.  CT abdomen/pelvis with contrast with interval development of moderate intra-/extrahepatic biliary dilation with multiple round filling defects mid/distal common duct suspicious for choledocholithiasis.  GI, [name] was consulted.  Recommended MRCP and hospitalist admission for further evaluation and management.   Assessment & Plan:   Principal Problem:   Choledocholithiasis Active Problems:   GERD (gastroesophageal reflux disease)   Iron deficiency anemia   Choledocholithiasis Patient presenting with progressive abdominal pain.  Noted to have elevated transaminitis and total bilirubin.  CT abdomen/pelvis with IV contrast with intra-/extrahepatic biliary duct dilation with filling defects within the common duct concerning for choledocholithiasis.  Underwent ERCP on 03-30-2020, 3 large common bile duct stones noted, unable to perform a sphincterotomy and stone extraction due to anatomy s/p Billroth II; and had stent placement  --Gastroenterology following, [name]; appreciate assistance --AST [phone] --ALT 46>962>952 --AP  124>126>107 --Tbili 2.3>5.3>5.9 --WBC 11.7>14.6>10.0 --Continue empiric antibiotics with Zosyn --Pain control and antiemetics --Continue to follow LFTs and CBC daily --GI plans referral to [facility] for repeat ERCP with stone extraction  GERD --Carafate 1 g 4 times daily --Protonix 40 mg p.o. daily  Iron deficiency anemia On ferrous sulfate 325 mg daily at home.   DVT prophylaxis: SCDs Code Status: Full code Family Communication: Spouse present at bedside  Disposition Plan:  Status is: Inpatient  Remains inpatient appropriate because:Ongoing active pain requiring inpatient pain management, Ongoing diagnostic testing needed not appropriate for outpatient work up, IV treatments appropriate due to intensity of illness or inability to take PO and Inpatient level of care appropriate due to severity of illness   Dispo: The patient is from: Home              Anticipated d/c is to: Home              Anticipated d/c date is: 2 days              Patient currently is not medically stable to d/c.   Consultants:   Gastroenterology, [name]  Procedures:   ERCP: Pending  Antimicrobials:   Zosyn 12/9>>   Subjective: Patient seen and examined at bedside, resting comfortably.  Complains of some slight indigestion after eating breakfast this morning.  Spouse present at bedside.  Restarted home Carafate.  Seen by GI this morning, [name] with recommendations of continued hospitalization into further monitor LFTs; possible discharge tomorrow with outpatient referral to [facility] for ERCP and stone extraction if LFTs continue to improve.  No other questions or concerns at this time.  Denies headache, no fever/chills/night sweats, no current vomiting/diarrhea, no chest pain, no palpitations, no shortness of  breath, no cough/congestion, no weakness, no fatigue, no paresthesias.  No acute concerns this morning per nursing staff.  Objective: Vitals:   03/10/2020 1723 03/06/2020  2355 03/14/20 0529 03/14/20 1215  BP: (!) 123/55 (!) 144/72 140/72 (!) 141/68  Pulse: 63 63 73 (!) 52  Resp: 16 17 18 18   Temp: 98.6 F (37 C) 98.2 F (36.8 C) 98.7 F (37.1 C) 98.7 F (37.1 C)  TempSrc: Oral Oral Oral Oral  SpO2: 96% 96% 96% 94%  Weight:      Height:        Intake/Output Summary (Last 24 hours) at 03/14/2020 1302 Last data filed at 03/11/2020 2000 Gross per 24 hour  Intake 1039.71 ml  Output 300 ml  Net 739.71 ml   Filed Weights   04/03/2020 1442  Weight: 69.9 kg    Examination:  General exam: Appears calm and comfortable  Respiratory system: Clear to auscultation. Respiratory effort normal. Cardiovascular system: S1 & S2 heard, RRR. No JVD, murmurs, rubs, gallops or clicks. No pedal edema. Gastrointestinal system: Abdomen is nondistended, soft, NTTP. No organomegaly or masses felt. Normal bowel sounds heard. Central nervous system: Alert and oriented. No focal neurological deficits. Extremities: Symmetric 5 x 5 power. Skin: No rashes, lesions or ulcers Psychiatry: Judgement and insight appear normal. Mood & affect appropriate.     Data Reviewed: I have personally reviewed following labs and imaging studies  CBC: Recent Labs  Lab 03/09/2020 1452 04/03/2020 0453 03/14/20 0209  WBC 11.7* 14.6* 10.0  HGB 15.6 14.4 13.5  HCT 47.9 41.0 40.4  MCV 90.0 85.4 87.3  PLT 210 173 132*   Basic Metabolic Panel: Recent Labs  Lab 03/25/2020 1452 03/11/2020 0453 03/14/20 0209  NA 138 138 137  K 4.3 4.4 4.6  CL 104 106 106  CO2 22 21* 21*  GLUCOSE 133* 136* 146*  BUN 9 11 14   CREATININE 0.94 1.03 1.07  CALCIUM 8.8* 8.5* 8.5*  MG  --   --  2.1   GFR: Estimated Creatinine Clearance: 58 mL/min (by C-G formula based on SCr of 1.07 mg/dL). Liver Function Tests: Recent Labs  Lab 03/29/2020 1452 03/05/2020 0453 03/14/20 0209  AST 137* 158* 84*  ALT 93* 178* 136*  ALKPHOS 124 126 107  BILITOT 2.3* 5.3* 5.9*  PROT 6.4* 5.9* 5.7*  ALBUMIN 3.8 3.3* 3.1*    Recent Labs  Lab 03/11/2020 1452  LIPASE 20   No results for input(s): AMMONIA in the last 168 hours. Coagulation Profile: No results for input(s): INR, PROTIME in the last 168 hours. Cardiac Enzymes: No results for input(s): CKTOTAL, CKMB, CKMBINDEX, TROPONINI in the last 168 hours. BNP (last 3 results) No results for input(s): PROBNP in the last 8760 hours. HbA1C: No results for input(s): HGBA1C in the last 72 hours. CBG: No results for input(s): GLUCAP in the last 168 hours. Lipid Profile: No results for input(s): CHOL, HDL, LDLCALC, TRIG, CHOLHDL, LDLDIRECT in the last 72 hours. Thyroid Function Tests: No results for input(s): TSH, T4TOTAL, FREET4, T3FREE, THYROIDAB in the last 72 hours. Anemia Panel: No results for input(s): VITAMINB12, FOLATE, FERRITIN, TIBC, IRON, RETICCTPCT in the last 72 hours. Sepsis Labs: No results for input(s): PROCALCITON, LATICACIDVEN in the last 168 hours.  Recent Results (from the past 240 hour(s))  Resp Panel by RT-PCR (Flu A&B, Covid) Nasopharyngeal Swab     Status: None   Collection Time: 03/27/2020 12:30 AM   Specimen: Nasopharyngeal Swab; Nasopharyngeal(NP) swabs in vial transport medium  Result Value Ref  Range Status   SARS Coronavirus 2 by RT PCR NEGATIVE NEGATIVE Final    Comment: (NOTE) SARS-CoV-2 target nucleic acids are NOT DETECTED.  The SARS-CoV-2 RNA is generally detectable in upper respiratory specimens during the acute phase of infection. The lowest concentration of SARS-CoV-2 viral copies this assay can detect is 138 copies/mL. A negative result does not preclude SARS-Cov-2 infection and should not be used as the sole basis for treatment or other patient management decisions. A negative result may occur with  improper specimen collection/handling, submission of specimen other than nasopharyngeal swab, presence of viral mutation(s) within the areas targeted by this assay, and inadequate number of viral copies(<138  copies/mL). A negative result must be combined with clinical observations, patient history, and epidemiological information. The expected result is Negative.  Fact Sheet for Patients:  BloggerCourse.comhttps://www.fda.gov/media/152166/download  Fact Sheet for Healthcare Providers:  SeriousBroker.ithttps://www.fda.gov/media/152162/download  This test is no t yet approved or cleared by the [place] FDA and  has been authorized for detection and/or diagnosis of SARS-CoV-2 by FDA under an Emergency Use Authorization (EUA). This EUA will remain  in effect (meaning this test can be used) for the duration of the COVID-19 declaration under Section 564(b)(1) of the Act, 21 U.S.C.section 360bbb-3(b)(1), unless the authorization is terminated  or revoked sooner.       Influenza A by PCR NEGATIVE NEGATIVE Final   Influenza B by PCR NEGATIVE NEGATIVE Final    Comment: (NOTE) The Xpert Xpress SARS-CoV-2/FLU/RSV plus assay is intended as an aid in the diagnosis of influenza from Nasopharyngeal swab specimens and should not be used as a sole basis for treatment. Nasal washings and aspirates are unacceptable for Xpert Xpress SARS-CoV-2/FLU/RSV testing.  Fact Sheet for Patients: BloggerCourse.comhttps://www.fda.gov/media/152166/download  Fact Sheet for Healthcare Providers: SeriousBroker.ithttps://www.fda.gov/media/152162/download  This test is not yet approved or cleared by the [place] FDA and has been authorized for detection and/or diagnosis of SARS-CoV-2 by FDA under an Emergency Use Authorization (EUA). This EUA will remain in effect (meaning this test can be used) for the duration of the COVID-19 declaration under Section 564(b)(1) of the Act, 21 U.S.C. section 360bbb-3(b)(1), unless the authorization is terminated or revoked.  Performed at [facility], [address] [place], [place] [address]          Radiology Studies: CT ABDOMEN PELVIS W CONTRAST  Result Date: 04/01/2020 CLINICAL DATA:  Nausea and vomiting EXAM: CT  ABDOMEN AND PELVIS WITH CONTRAST TECHNIQUE: Multidetector CT imaging of the abdomen and pelvis was performed using the standard protocol following bolus administration of intravenous contrast. CONTRAST:  100mL OMNIPAQUE IOHEXOL 300 MG/ML  SOLN COMPARISON:  CT 12/23/2016 FINDINGS: Lower chest: Lung bases demonstrate mild scarring at the left base. No acute consolidation or effusion. Surgical clips at the GE junction. Hepatobiliary: Gallbladder appears slightly dilated. No calcified gallstones. Interval development of moderate intra and extrahepatic biliary dilatation, common duct dilated up to 14 mm. Multiple rounded filling defects within the mid to distal common duct, series 5, image number 45, suspicious for stones. Pancreas: Fatty atrophy without inflammatory change Spleen: Normal in size without focal abnormality. Adrenals/Urinary Tract: Adrenal glands are normal. Kidneys show no hydronephrosis. Slightly thick-walled appearance of urinary bladder without inflammatory change. Streaky hyperdense foci in the bladder with more focal hyperdensity along the left posterior bladder near the ureteral insertion, suspect that this is due to excreted contrast Stomach/Bowel: Status post partial gastrectomy with small bowel anastomosis within the anterior abdomen. No evidence for obstruction. No acute bowel wall  thickening. Negative appendix. Vascular/Lymphatic: Moderate aortic atherosclerosis. No aneurysm. No suspicious nodes. Reproductive: Prostate is unremarkable. Other: No free air or free fluid. Small fat containing umbilical hernia. Musculoskeletal: Scoliosis and postsurgical changes of the lumbar spine. Dysplastic appearing left acetabulum and femoral head and neck, chronic. IMPRESSION: 1. Interval development of moderate intra and extrahepatic biliary dilatation with multiple rounded filling defects within the mid to distal common duct, suspicious for choledocholithiasis. Suggest correlation with LFTs, further  evaluation with MRCP may be obtained. 2. Streaky hyperdense foci within the bladder, suspect that this is due to excreted contrast as opposed to bladder mass. 3. Status post partial gastrectomy with no evidence for an obstruction. 4. Aortic atherosclerosis. Aortic Atherosclerosis (ICD10-I70.0). Electronically Signed   By: Jasmine Pang M.D.   On: 03/23/2020 23:04   DG ERCP BILIARY & PANCREATIC DUCTS  Result Date: 03/16/2020 CLINICAL DATA:  69 year old male undergoing ERCP for choledocholithiasis EXAM: ERCP TECHNIQUE: Multiple spot images obtained with the fluoroscopic device and submitted for interpretation post-procedure. FLUOROSCOPY TIME:  Fluoroscopy Time:  3 minutes 36 seconds Radiation Exposure Index (if provided by the fluoroscopic device): 46.24 mGy COMPARISON:  CT abdomen/pelvis 03/21/2020 FINDINGS: A total of 4 intraoperative saved images are submitted for review. The images demonstrate a flexible duodenal scope in the descending duodenum with wire cannulation of the common bile duct. Cholangiogram demonstrates intra and extrahepatic biliary ductal dilatation. Multiple filling defects in the distal common bile duct consistent with choledocholithiasis. Subsequent images demonstrate sphincterotomy and balloon sweeping of the duct. IMPRESSION: 1. Choledocholithiasis. 2. ERCP with sphincterotomy and presumed sweeping of the common duct. These images were submitted for radiologic interpretation only. Please see the procedural report for the amount of contrast and the fluoroscopy time utilized. Electronically Signed   By: Malachy Moan M.D.   On: 04/01/2020 15:47        Scheduled Meds: . pantoprazole  40 mg Oral Daily  . senna-docusate  1 tablet Oral Once  . sucralfate  1 g Oral TID WC & HS   Continuous Infusions: . lactated ringers 100 mL/hr at 03/14/20 0328  . piperacillin-tazobactam (ZOSYN)  IV 3.375 g (03/14/20 0612)     LOS: 1 day    Time spent: 35 minutes spent on chart review,  discussion with nursing staff, consultants, updating family and interview/physical exam; more than 50% of that time was spent in counseling and/or coordination of care.    Alvira Philips [place], DO [facility] Available via Epic secure chat 7am-7pm After these hours, please refer to coverage provider listed on [email or web address] 03/14/2020, 1:02 PM

## 2020-03-15 ENCOUNTER — Encounter (HOSPITAL_COMMUNITY): Payer: Self-pay | Admitting: Gastroenterology

## 2020-03-15 DIAGNOSIS — R110 Nausea: Secondary | ICD-10-CM

## 2020-03-15 DIAGNOSIS — R7989 Other specified abnormal findings of blood chemistry: Secondary | ICD-10-CM

## 2020-03-15 LAB — COMPREHENSIVE METABOLIC PANEL
ALT: 100 U/L — ABNORMAL HIGH (ref 0–44)
AST: 46 U/L — ABNORMAL HIGH (ref 15–41)
Albumin: 3.1 g/dL — ABNORMAL LOW (ref 3.5–5.0)
Alkaline Phosphatase: 123 U/L (ref 38–126)
Anion gap: 12 (ref 5–15)
BUN: 10 mg/dL (ref 8–23)
CO2: 19 mmol/L — ABNORMAL LOW (ref 22–32)
Calcium: 8.4 mg/dL — ABNORMAL LOW (ref 8.9–10.3)
Chloride: 103 mmol/L (ref 98–111)
Creatinine, Ser: 0.92 mg/dL (ref 0.61–1.24)
GFR, Estimated: >60 mL/min (ref >60)
Glucose, Bld: 73 mg/dL (ref 70–99)
Potassium: 3.9 mmol/L (ref 3.5–5.1)
Sodium: 134 mmol/L — ABNORMAL LOW (ref 135–145)
Total Bilirubin: 4.1 mg/dL — ABNORMAL HIGH (ref 0.3–1.2)
Total Protein: 5.9 g/dL — ABNORMAL LOW (ref 6.5–8.1)

## 2020-03-15 LAB — CBC
HCT: 43.4 % (ref 39.0–52.0)
Hemoglobin: 14.5 g/dL (ref 13.0–17.0)
MCH: 29.1 pg (ref 26.0–34.0)
MCHC: 33.4 g/dL (ref 30.0–36.0)
MCV: 87 fL (ref 80.0–100.0)
Platelets: 144 10*3/uL — ABNORMAL LOW (ref 150–400)
RBC: 4.99 MIL/uL (ref 4.22–5.81)
RDW: 12.8 % (ref 11.5–15.5)
WBC: 11.2 10*3/uL — ABNORMAL HIGH (ref 4.0–10.5)
nRBC: 0 % (ref 0.0–0.2)

## 2020-03-15 MED ORDER — ONDANSETRON HCL 4 MG/2ML IJ SOLN
4.0000 mg | Freq: Once | INTRAMUSCULAR | Status: AC | PRN
  Administered 2020-03-15: 01:00:00 4 mg via INTRAVENOUS
  Filled 2020-03-15: qty 2

## 2020-03-15 MED ORDER — ONDANSETRON HCL 4 MG PO TABS
4.0000 mg | ORAL_TABLET | Freq: Three times a day (TID) | ORAL | Status: DC
  Administered 2020-03-15 – 2020-03-16 (×6): 4 mg via ORAL
  Filled 2020-03-15 (×7): qty 1

## 2020-03-15 NOTE — Progress Notes (Signed)
PROGRESS NOTE    Jorge Pi.  OXB:[id number] DOB: [birth year] DOA: 03/18/2020 PCP: Salley Scarlet, MD    Brief Narrative:  Jorge Aprea. is a 69 year old male with past medical history significant for GERD who presented to the ED with nausea/vomiting and abdominal pain.  Patient reports symptoms onset 3 days ago, slightly improved but abruptly worsened overnight.  Patient reports vomitus is nonbloody nonbilious.  Continues with bowel movements.  Denies fevers, chills, night sweats, no chest pain, no palpitations, no shortness of breath, no weakness, no fatigue.  In the ED, temperature 98.2, HR 56, RR 20, BP 105/51, SPO2 98% on room air.  Sodium 138, potassium 4.3, glucose 133, creatinine 0.94, lipase 20, AST 137, ALT 93, total bilirubin 2.3.  WBC count 11.7, hemoglobin 15.6, platelets 210.  Covid-19/influenza A/B negative.  Urinalysis unremarkable.  CT abdomen/pelvis with contrast with interval development of moderate intra-/extrahepatic biliary dilation with multiple round filling defects mid/distal common duct suspicious for choledocholithiasis.  GI, [name] was consulted.  Recommended MRCP and hospitalist admission for further evaluation and management.   Assessment & Plan:   Principal Problem:   Choledocholithiasis Active Problems:   GERD (gastroesophageal reflux disease)   Iron deficiency anemia   Choledocholithiasis Patient presenting with progressive abdominal pain.  Noted to have elevated transaminitis and total bilirubin.  CT abdomen/pelvis with IV contrast with intra-/extrahepatic biliary duct dilation with filling defects within the common duct concerning for choledocholithiasis.  Underwent ERCP on Mar 19, 2020, 3 large common bile duct stones noted, unable to perform a sphincterotomy and stone extraction due to anatomy s/p Billroth II; and had stent placement  --Gastroenterology following, [name]; appreciate assistance --AST [id number] --ALT  93>178>136>100 --AP 124>126>107>123 --Tbili 2.3>5.3>5.9>4.1 --WBC 11.7>14.6>10.0>11.2 --Continue empiric antibiotics with Zosyn --Pain control and antiemetics --Continue to follow LFTs and CBC daily --GI plans referral to [facility] for repeat ERCP with stone extraction  GERD --Carafate 1 g 4 times daily --Protonix 40 mg p.o. daily  Iron deficiency anemia On ferrous sulfate 325 mg daily at home.   DVT prophylaxis: SCDs Code Status: Full code Family Communication: Spouse present at bedside  Disposition Plan:  Status is: Inpatient  Remains inpatient appropriate because:Ongoing active pain requiring inpatient pain management, Ongoing diagnostic testing needed not appropriate for outpatient work up, IV treatments appropriate due to intensity of illness or inability to take PO and Inpatient level of care appropriate due to severity of illness   Dispo: The patient is from: Home              Anticipated d/c is to: Home              Anticipated d/c date is: 1 day              Patient currently is not medically stable to d/c.   Consultants:   Gastroenterology, [name]  Procedures:   ERCP: Pending  Antimicrobials:   Zosyn 12/9>>   Subjective: Patient seen and examined at bedside, resting comfortably; spouse present.  Continues with overall "ill feeling".  Also with persistent nausea and abdominal discomfort.  Wife concerned about him being able to go home as she works a significant distance from their house and to to leave him alone during the day in his current condition.  Discussed with patient and spouse that current stent appears to be improving his bilirubin and LFTs but this is only a temporizing solution until he can receive stone extraction.  Denies headache, no visual  changes, no chest pain, palpitations, no shortness of breath, no weakness, no fatigue.  No acute events overnight per nursing staff.  Objective: Vitals:   03/14/20 1715 03/14/20 1927 03/14/20 2250  03/15/20 0332  BP: (!) 152/67 (!) 150/77 (!) 148/76 (!) 150/72  Pulse: (!) 59 62 74 68  Resp: 18 20 18 16   Temp: 98.4 F (36.9 C) 98.4 F (36.9 C) 98.4 F (36.9 C) 98.5 F (36.9 C)  TempSrc: Oral Oral Oral Oral  SpO2: 95% 95% 95% 94%  Weight:      Height:        Intake/Output Summary (Last 24 hours) at 03/15/2020 14/03/2020 Last data filed at 03/15/2020 0500 Gross per 24 hour  Intake 240 ml  Output 600 ml  Net -360 ml   Filed Weights   03/30/2020 1442  Weight: 69.9 kg    Examination:  General exam: Calm, slightly ill in appearance Respiratory system: Clear to auscultation. Respiratory effort normal.  On room air Cardiovascular system: S1 & S2 heard, RRR. No JVD, murmurs, rubs, gallops or clicks. No pedal edema. Gastrointestinal system: Abdomen is nondistended, soft, mild epigastric/right upper quadrant tenderness, No organomegaly or masses felt. Normal bowel sounds heard. Central nervous system: Alert and oriented. No focal neurological deficits. Extremities: Symmetric 5 x 5 power. Skin: No rashes, lesions or ulcers Psychiatry: Judgement and insight appear normal. Mood & affect appropriate.     Data Reviewed: I have personally reviewed following labs and imaging studies  CBC: Recent Labs  Lab 03/28/2020 1452 03/28/2020 0453 03/14/20 0209 03/15/20 0245  WBC 11.7* 14.6* 10.0 11.2*  HGB 15.6 14.4 13.5 14.5  HCT 47.9 41.0 40.4 43.4  MCV 90.0 85.4 87.3 87.0  PLT 210 173 132* 144*   Basic Metabolic Panel: Recent Labs  Lab 03/10/2020 1452 03/14/2020 0453 03/14/20 0209 03/15/20 0245  NA 138 138 137 134*  K 4.3 4.4 4.6 3.9  CL 104 106 106 103  CO2 22 21* 21* 19*  GLUCOSE 133* 136* 146* 73  BUN 9 11 14 10   CREATININE 0.94 1.03 1.07 0.92  CALCIUM 8.8* 8.5* 8.5* 8.4*  MG  --   --  2.1  --    GFR: Estimated Creatinine Clearance: 67.4 mL/min (by C-G formula based on SCr of 0.92 mg/dL). Liver Function Tests: Recent Labs  Lab 03/04/2020 1452 03/17/2020 0453 03/14/20 0209  03/15/20 0245  AST 137* 158* 84* 46*  ALT 93* 178* 136* 100*  ALKPHOS 124 126 107 123  BILITOT 2.3* 5.3* 5.9* 4.1*  PROT 6.4* 5.9* 5.7* 5.9*  ALBUMIN 3.8 3.3* 3.1* 3.1*   Recent Labs  Lab 04/03/2020 1452  LIPASE 20   No results for input(s): AMMONIA in the last 168 hours. Coagulation Profile: No results for input(s): INR, PROTIME in the last 168 hours. Cardiac Enzymes: No results for input(s): CKTOTAL, CKMB, CKMBINDEX, TROPONINI in the last 168 hours. BNP (last 3 results) No results for input(s): PROBNP in the last 8760 hours. HbA1C: No results for input(s): HGBA1C in the last 72 hours. CBG: No results for input(s): GLUCAP in the last 168 hours. Lipid Profile: No results for input(s): CHOL, HDL, LDLCALC, TRIG, CHOLHDL, LDLDIRECT in the last 72 hours. Thyroid Function Tests: No results for input(s): TSH, T4TOTAL, FREET4, T3FREE, THYROIDAB in the last 72 hours. Anemia Panel: No results for input(s): VITAMINB12, FOLATE, FERRITIN, TIBC, IRON, RETICCTPCT in the last 72 hours. Sepsis Labs: No results for input(s): PROCALCITON, LATICACIDVEN in the last 168 hours.  Recent Results (from the past  240 hour(s))  Resp Panel by RT-PCR (Flu A&B, Covid) Nasopharyngeal Swab     Status: None   Collection Time: 03/22/2020 12:30 AM   Specimen: Nasopharyngeal Swab; Nasopharyngeal(NP) swabs in vial transport medium  Result Value Ref Range Status   SARS Coronavirus 2 by RT PCR NEGATIVE NEGATIVE Final    Comment: (NOTE) SARS-CoV-2 target nucleic acids are NOT DETECTED.  The SARS-CoV-2 RNA is generally detectable in upper respiratory specimens during the acute phase of infection. The lowest concentration of SARS-CoV-2 viral copies this assay can detect is 138 copies/mL. A negative result does not preclude SARS-Cov-2 infection and should not be used as the sole basis for treatment or other patient management decisions. A negative result may occur with  improper specimen collection/handling,  submission of specimen other than nasopharyngeal swab, presence of viral mutation(s) within the areas targeted by this assay, and inadequate number of viral copies(<138 copies/mL). A negative result must be combined with clinical observations, patient history, and epidemiological information. The expected result is Negative.  Fact Sheet for Patients:  BloggerCourse.comhttps://www.fda.gov/media/152166/download  Fact Sheet for Healthcare Providers:  SeriousBroker.ithttps://www.fda.gov/media/152162/download  This test is no t yet approved or cleared by the [place] FDA and  has been authorized for detection and/or diagnosis of SARS-CoV-2 by FDA under an Emergency Use Authorization (EUA). This EUA will remain  in effect (meaning this test can be used) for the duration of the COVID-19 declaration under Section 564(b)(1) of the Act, 21 U.S.C.section 360bbb-3(b)(1), unless the authorization is terminated  or revoked sooner.       Influenza A by PCR NEGATIVE NEGATIVE Final   Influenza B by PCR NEGATIVE NEGATIVE Final    Comment: (NOTE) The Xpert Xpress SARS-CoV-2/FLU/RSV plus assay is intended as an aid in the diagnosis of influenza from Nasopharyngeal swab specimens and should not be used as a sole basis for treatment. Nasal washings and aspirates are unacceptable for Xpert Xpress SARS-CoV-2/FLU/RSV testing.  Fact Sheet for Patients: BloggerCourse.comhttps://www.fda.gov/media/152166/download  Fact Sheet for Healthcare Providers: SeriousBroker.ithttps://www.fda.gov/media/152162/download  This test is not yet approved or cleared by the [place] FDA and has been authorized for detection and/or diagnosis of SARS-CoV-2 by FDA under an Emergency Use Authorization (EUA). This EUA will remain in effect (meaning this test can be used) for the duration of the COVID-19 declaration under Section 564(b)(1) of the Act, 21 U.S.C. section 360bbb-3(b)(1), unless the authorization is terminated or revoked.  Performed at [facility], [address] [place], [place] [address]          Radiology Studies: DG ERCP BILIARY & PANCREATIC DUCTS  Result Date: 03/17/2020 CLINICAL DATA:  69 year old male undergoing ERCP for choledocholithiasis EXAM: ERCP TECHNIQUE: Multiple spot images obtained with the fluoroscopic device and submitted for interpretation post-procedure. FLUOROSCOPY TIME:  Fluoroscopy Time:  3 minutes 36 seconds Radiation Exposure Index (if provided by the fluoroscopic device): 46.24 mGy COMPARISON:  CT abdomen/pelvis 03/11/2020 FINDINGS: A total of 4 intraoperative saved images are submitted for review. The images demonstrate a flexible duodenal scope in the descending duodenum with wire cannulation of the common bile duct. Cholangiogram demonstrates intra and extrahepatic biliary ductal dilatation. Multiple filling defects in the distal common bile duct consistent with choledocholithiasis. Subsequent images demonstrate sphincterotomy and balloon sweeping of the duct. IMPRESSION: 1. Choledocholithiasis. 2. ERCP with sphincterotomy and presumed sweeping of the common duct. These images were submitted for radiologic interpretation only. Please see the procedural report for the amount of contrast and the fluoroscopy time utilized. Electronically Signed  By: Malachy Moan M.D.   On: 03/06/2020 15:47        Scheduled Meds: . pantoprazole  40 mg Oral Daily  . senna-docusate  1 tablet Oral Once  . sucralfate  1 g Oral TID WC & HS   Continuous Infusions: . piperacillin-tazobactam (ZOSYN)  IV 3.375 g (03/15/20 0636)     LOS: 2 days    Time spent: 35 minutes spent on chart review, discussion with nursing staff, consultants, updating family and interview/physical exam; more than 50% of that time was spent in counseling and/or coordination of care.    Alvira Philips [place], DO [facility] Available via Epic secure chat 7am-7pm After these hours, please refer to coverage provider listed on [email or web address] 03/15/2020, 9:37  AM

## 2020-03-15 NOTE — Progress Notes (Signed)
Patient laying flat; reminded to sit up since he just swallowed po meds. Pt laid back down as well placing HOB back down. Laying in bed without complaints.

## 2020-03-15 NOTE — Progress Notes (Signed)
Patient called front desk requesting for nausea medication. Last dose of phenergan given at 2153. On call provider text paged

## 2020-03-15 NOTE — Progress Notes (Signed)
  GI Progress Note Covering for Drs. Mann & Hung   Subjective  Chronic persistent problems with nausea for years. No abdominal pain. Phenergan appears to make him drowsy. Little PO intake yesterday afternoon and this morning.    Objective  Vital signs in last 24 hours: Temp:  [98.4 F (36.9 C)-98.7 F (37.1 C)] 98.5 F (36.9 C) (12/12 0332) Pulse Rate:  [52-74] 68 (12/12 0332) Resp:  [16-20] 16 (12/12 0332) BP: (141-152)/(67-77) 150/72 (12/12 0332) SpO2:  [94 %-95 %] 94 % (12/12 0332) Last BM Date: 03/22/2020  General: Alert, well-developed, in NAD Heart:  Regular rate and rhythm; no murmurs Chest: Clear to ascultation bilaterally Abdomen:  Soft, nontender and nondistended. Normal bowel sounds, without guarding, and without rebound.   Extremities:  Without edema. Neurologic:  Alert and  oriented x4; grossly normal neurologically. Psych:  Alert and cooperative. Normal mood and affect.  Intake/Output from previous day: 12/11 0701 - 12/12 0700 In: 240 [P.O.:240] Out: 600 [Urine:600] Intake/Output this shift: No intake/output data recorded.  Lab Results: Recent Labs    03/08/2020 0453 03/14/20 0209 03/15/20 0245  WBC 14.6* 10.0 11.2*  HGB 14.4 13.5 14.5  HCT 41.0 40.4 43.4  PLT 173 132* 144*   BMET Recent Labs    03/20/2020 0453 03/14/20 0209 03/15/20 0245  NA 138 137 134*  K 4.4 4.6 3.9  CL 106 106 103  CO2 21* 21* 19*  GLUCOSE 136* 146* 73  BUN 11 14 10   CREATININE 1.03 1.07 0.92  CALCIUM 8.5* 8.5* 8.4*   LFT Recent Labs    03/15/20 0245  PROT 5.9*  ALBUMIN 3.1*  AST 46*  ALT 100*  ALKPHOS 123  BILITOT 4.1*   Studies/Results: DG ERCP BILIARY & PANCREATIC DUCTS  Result Date: 03/31/2020 CLINICAL DATA:  69 year old male undergoing ERCP for choledocholithiasis EXAM: ERCP TECHNIQUE: Multiple spot images obtained with the fluoroscopic device and submitted for interpretation post-procedure. FLUOROSCOPY TIME:  Fluoroscopy Time:  3 minutes 36 seconds Radiation  Exposure Index (if provided by the fluoroscopic device): 46.24 mGy COMPARISON:  CT abdomen/pelvis 03/28/2020 FINDINGS: A total of 4 intraoperative saved images are submitted for review. The images demonstrate a flexible duodenal scope in the descending duodenum with wire cannulation of the common bile duct. Cholangiogram demonstrates intra and extrahepatic biliary ductal dilatation. Multiple filling defects in the distal common bile duct consistent with choledocholithiasis. Subsequent images demonstrate sphincterotomy and balloon sweeping of the duct. IMPRESSION: 1. Choledocholithiasis. 2. ERCP with sphincterotomy and presumed sweeping of the common duct. These images were submitted for radiologic interpretation only. Please see the procedural report for the amount of contrast and the fluoroscopy time utilized. Electronically Signed   By: 14/12/2019 M.D.   On: 03/19/2020 15:47      Assessment & Recommendations   1. Choledocholithiasis, S/P Bilroth II. ERCP with biliary stent placed on Friday. No significant abdominal pain and LFTs are down trending. DC Unasyn. Dr. Thursday will make arrangements this coming week for ERCP referral as outpatient.   2. Chronic persistent nausea, etiology unclear. DC phenergan due to drowsiness. Start Zofran 4 mg po qid, ac & hs (RTC, not prn). Continue Carafate qid prn and Protonix 40 mg po qd as outpatient for now. OOB to chair. If he is able to eat then OK for discharge later today from GI standpoint.   GI signing off. [name] resuming care tomorrow.    LOS: 2 days   Elnoria Howard T. Judie Petit MD 03/15/2020, 10:58 AM

## 2020-03-16 LAB — CBC
HCT: 43.5 % (ref 39.0–52.0)
Hemoglobin: 15.5 g/dL (ref 13.0–17.0)
MCH: 30 pg (ref 26.0–34.0)
MCHC: 35.6 g/dL (ref 30.0–36.0)
MCV: 84.3 fL (ref 80.0–100.0)
Platelets: 157 10*3/uL (ref 150–400)
RBC: 5.16 MIL/uL (ref 4.22–5.81)
RDW: 12.8 % (ref 11.5–15.5)
WBC: 13.9 10*3/uL — ABNORMAL HIGH (ref 4.0–10.5)
nRBC: 0 % (ref 0.0–0.2)

## 2020-03-16 LAB — COMPREHENSIVE METABOLIC PANEL
ALT: 80 U/L — ABNORMAL HIGH (ref 0–44)
AST: 33 U/L (ref 15–41)
Albumin: 2.9 g/dL — ABNORMAL LOW (ref 3.5–5.0)
Alkaline Phosphatase: 141 U/L — ABNORMAL HIGH (ref 38–126)
Anion gap: 13 (ref 5–15)
BUN: 11 mg/dL (ref 8–23)
CO2: 18 mmol/L — ABNORMAL LOW (ref 22–32)
Calcium: 8.5 mg/dL — ABNORMAL LOW (ref 8.9–10.3)
Chloride: 102 mmol/L (ref 98–111)
Creatinine, Ser: 0.83 mg/dL (ref 0.61–1.24)
GFR, Estimated: >60 mL/min (ref >60)
Glucose, Bld: 77 mg/dL (ref 70–99)
Potassium: 3.8 mmol/L (ref 3.5–5.1)
Sodium: 133 mmol/L — ABNORMAL LOW (ref 135–145)
Total Bilirubin: 3.1 mg/dL — ABNORMAL HIGH (ref 0.3–1.2)
Total Protein: 6 g/dL — ABNORMAL LOW (ref 6.5–8.1)

## 2020-03-16 MED ORDER — PROMETHAZINE HCL 25 MG/ML IJ SOLN
12.5000 mg | Freq: Once | INTRAMUSCULAR | Status: AC
  Administered 2020-03-16: 19:00:00 12.5 mg via INTRAVENOUS
  Filled 2020-03-16: qty 1

## 2020-03-16 MED ORDER — ONDANSETRON HCL 4 MG/2ML IJ SOLN
4.0000 mg | Freq: Four times a day (QID) | INTRAMUSCULAR | Status: DC | PRN
  Administered 2020-03-16 – 2020-03-17 (×2): 4 mg via INTRAVENOUS
  Filled 2020-03-16 (×2): qty 2

## 2020-03-16 MED ORDER — SODIUM CHLORIDE 0.9 % IV SOLN: INTRAVENOUS | Status: DC

## 2020-03-16 NOTE — Progress Notes (Addendum)
PROGRESS NOTE    Jorge Evans.  VBT:[id number] DOB: 1951/01/13 DOA: 04/01/2020 PCP: Salley Scarlet, MD    Brief Narrative:  Jorge Evans. is a 69 year old male with past medical history significant for GERD who presented to the ED with nausea/vomiting and abdominal pain.  Patient reports symptoms onset 3 days ago, slightly improved but abruptly worsened overnight.  Patient reports vomitus is nonbloody nonbilious.  Continues with bowel movements.  Denies fevers, chills, night sweats, no chest pain, no palpitations, no shortness of breath, no weakness, no fatigue.  In the ED, temperature 98.2, HR 56, RR 20, BP 105/51, SPO2 98% on room air.  Sodium 138, potassium 4.3, glucose 133, creatinine 0.94, lipase 20, AST 137, ALT 93, total bilirubin 2.3.  WBC count 11.7, hemoglobin 15.6, platelets 210.  Covid-19/influenza A/B negative.  Urinalysis unremarkable.  CT abdomen/pelvis with contrast with interval development of moderate intra-/extrahepatic biliary dilation with multiple round filling defects mid/distal common duct suspicious for choledocholithiasis.  GI, [name] was consulted.  Recommended MRCP and hospitalist admission for further evaluation and management.   Assessment & Plan:   Principal Problem:   Choledocholithiasis Active Problems:   GERD (gastroesophageal reflux disease)   Iron deficiency anemia   Elevated LFTs   Choledocholithiasis Patient presenting with progressive abdominal pain.  Noted to have elevated transaminitis and total bilirubin.  CT abdomen/pelvis with IV contrast with intra-/extrahepatic biliary duct dilation with filling defects within the common duct concerning for choledocholithiasis.  Underwent ERCP on 03/28/2020, 3 large common bile duct stones noted, unable to perform a sphincterotomy and stone extraction due to anatomy s/p Billroth II; and had stent placement  --Gastroenterology following, [name]; appreciate assistance --AST  [phone] --ALT 93>178>136>100>80 --AP 124>126>107>123>141 --Tbili 2.3>5.3>5.9>4.1>3.1 --WBC 11.7>14.6>10.0>11.2>13.9 --Zosyn discontinued by GI on 12/12 --Pain control and antiemetics --Continue to follow LFTs and CBC daily --GI plans referral to [facility] for repeat ERCP with stone extraction; but given patient's poor appetite and persistent nausea despite scheduled Zofran, may need to consider inpatient transfer to [facility].  Will await further GI recommendations  Addendum 1530: Discussed with GI, [name] this afternoon. Given his persistent symptoms of nausea and poor oral intake, patient likely needs definitive treatment with stone extraction inpatient. [name] will discuss with Dr. Meridee Score, if he is able to perform. Called [facility] for inpatient transfer for need of advanced endoscopic procedure, unfortunately no beds available today. Updated patient's spouse at bedside.  Addendum 1637: Received update from [name], Dr. Charlane Ferretti likely unable to accommodate ERCP until Friday possibly.  Given patient's symptoms of persistent nausea and pain, he recommended to get general surgery involved for consideration of Lap chole.  Consulted general surgery, Dr. Janee Morn who will graciously see patient this evening for evaluation.  GERD --Carafate 1 g 4 times daily --Protonix 40 mg p.o. daily  Iron deficiency anemia On ferrous sulfate 325 mg daily at home.   DVT prophylaxis: SCDs Code Status: Full code Family Communication: Spouse present at bedside  Disposition Plan:  Status is: Inpatient  Remains inpatient appropriate because:Ongoing active pain requiring inpatient pain management, Ongoing diagnostic testing needed not appropriate for outpatient work up, IV treatments appropriate due to intensity of illness or inability to take PO and Inpatient level of care appropriate due to severity of illness   Dispo: The patient is from: Home              Anticipated d/c is  to: Home  Anticipated d/c date is: 1 day              Patient currently is not medically stable to d/c.   Consultants:   Gastroenterology, [name]  Procedures:   ERCP 12/10  Antimicrobials:   Zosyn 12/9 - 12/12   Subjective: Patient seen and examined at bedside, sleeping, but easily arousable.  Continues with persistent nausea despite scheduled Zofran and Carafate.  Very poor oral intake, did not eat any of his breakfast or lunch today.  Tolerating water without vomiting.  Overall "ill feeling".  Spouse concerned regarding his progressive decline with overall weakness, lack of appetite.  Patient with low-grade temperature of 100.6 past 24 hours, antibiotics were discontinued by GI yesterday.  Patient without any other complaints or concerns at this time.  Denies headache, no chest pain, no palpitations, no shortness of breath, no cough/congestion.  No acute events overnight per nursing staff.  Objective: Vitals:   03/15/20 1815 03/15/20 2250 03/16/20 0510 03/16/20 1212  BP: (!) 143/70 140/68 (!) 141/66 131/70  Pulse: 80 70 72 78  Resp: 16 20 16 16   Temp: 99.3 F (37.4 C) 98 F (36.7 C) 97.8 F (36.6 C) 99.1 F (37.3 C)  TempSrc: Oral Oral Oral Oral  SpO2: 95% 98% 97% 96%  Weight:      Height:        Intake/Output Summary (Last 24 hours) at 03/16/2020 1257 Last data filed at 03/16/2020 0800 Gross per 24 hour  Intake 60 ml  Output 310 ml  Net -250 ml   Filed Weights   03/08/2020 1442  Weight: 69.9 kg    Examination:  General exam: Calm, ill in appearance Respiratory system: Clear to auscultation. Respiratory effort normal.  On room air Cardiovascular system: S1 & S2 heard, RRR. No JVD, murmurs, rubs, gallops or clicks. No pedal edema. Gastrointestinal system: Abdomen is nondistended, soft, mild epigastric/right upper quadrant tenderness, No organomegaly or masses felt. Normal bowel sounds heard. Central nervous system: Alert and oriented. No focal  neurological deficits. Extremities: Symmetric 5 x 5 power. Skin: No rashes, lesions or ulcers Psychiatry: Judgement and insight appear normal. Mood & affect appropriate.     Data Reviewed: I have personally reviewed following labs and imaging studies  CBC: Recent Labs  Lab 03/30/2020 1452 03-16-20 0453 03/14/20 0209 03/15/20 0245 03/16/20 0303  WBC 11.7* 14.6* 10.0 11.2* 13.9*  HGB 15.6 14.4 13.5 14.5 15.5  HCT 47.9 41.0 40.4 43.4 43.5  MCV 90.0 85.4 87.3 87.0 84.3  PLT 210 173 132* 144* 157   Basic Metabolic Panel: Recent Labs  Lab 03/18/2020 1452 2020/03/16 0453 03/14/20 0209 03/15/20 0245 03/16/20 0303  NA 138 138 137 134* 133*  K 4.3 4.4 4.6 3.9 3.8  CL 104 106 106 103 102  CO2 22 21* 21* 19* 18*  GLUCOSE 133* 136* 146* 73 77  BUN 9 11 14 10 11   CREATININE 0.94 1.03 1.07 0.92 0.83  CALCIUM 8.8* 8.5* 8.5* 8.4* 8.5*  MG  --   --  2.1  --   --    GFR: Estimated Creatinine Clearance: 74.7 mL/min (by C-G formula based on SCr of 0.83 mg/dL). Liver Function Tests: Recent Labs  Lab 03/23/2020 1452 2020-03-16 0453 03/14/20 0209 03/15/20 0245 03/16/20 0303  AST 137* 158* 84* 46* 33  ALT 93* 178* 136* 100* 80*  ALKPHOS 124 126 107 123 141*  BILITOT 2.3* 5.3* 5.9* 4.1* 3.1*  PROT 6.4* 5.9* 5.7* 5.9* 6.0*  ALBUMIN 3.8 3.3*  3.1* 3.1* 2.9*   Recent Labs  Lab 2020-03-21 1452  LIPASE 20   No results for input(s): AMMONIA in the last 168 hours. Coagulation Profile: No results for input(s): INR, PROTIME in the last 168 hours. Cardiac Enzymes: No results for input(s): CKTOTAL, CKMB, CKMBINDEX, TROPONINI in the last 168 hours. BNP (last 3 results) No results for input(s): PROBNP in the last 8760 hours. HbA1C: No results for input(s): HGBA1C in the last 72 hours. CBG: No results for input(s): GLUCAP in the last 168 hours. Lipid Profile: No results for input(s): CHOL, HDL, LDLCALC, TRIG, CHOLHDL, LDLDIRECT in the last 72 hours. Thyroid Function Tests: No results for  input(s): TSH, T4TOTAL, FREET4, T3FREE, THYROIDAB in the last 72 hours. Anemia Panel: No results for input(s): VITAMINB12, FOLATE, FERRITIN, TIBC, IRON, RETICCTPCT in the last 72 hours. Sepsis Labs: No results for input(s): PROCALCITON, LATICACIDVEN in the last 168 hours.  Recent Results (from the past 240 hour(s))  Resp Panel by RT-PCR (Flu A&B, Covid) Nasopharyngeal Swab     Status: None   Collection Time: 03/06/2020 12:30 AM   Specimen: Nasopharyngeal Swab; Nasopharyngeal(NP) swabs in vial transport medium  Result Value Ref Range Status   SARS Coronavirus 2 by RT PCR NEGATIVE NEGATIVE Final    Comment: (NOTE) SARS-CoV-2 target nucleic acids are NOT DETECTED.  The SARS-CoV-2 RNA is generally detectable in upper respiratory specimens during the acute phase of infection. The lowest concentration of SARS-CoV-2 viral copies this assay can detect is 138 copies/mL. A negative result does not preclude SARS-Cov-2 infection and should not be used as the sole basis for treatment or other patient management decisions. A negative result may occur with  improper specimen collection/handling, submission of specimen other than nasopharyngeal swab, presence of viral mutation(s) within the areas targeted by this assay, and inadequate number of viral copies(<138 copies/mL). A negative result must be combined with clinical observations, patient history, and epidemiological information. The expected result is Negative.  Fact Sheet for Patients:  BloggerCourse.com  Fact Sheet for Healthcare Providers:  SeriousBroker.it  This test is no t yet approved or cleared by the [place] FDA and  has been authorized for detection and/or diagnosis of SARS-CoV-2 by FDA under an Emergency Use Authorization (EUA). This EUA will remain  in effect (meaning this test can be used) for the duration of the COVID-19 declaration under Section 564(b)(1) of the Act,  21 U.S.C.section 360bbb-3(b)(1), unless the authorization is terminated  or revoked sooner.       Influenza A by PCR NEGATIVE NEGATIVE Final   Influenza B by PCR NEGATIVE NEGATIVE Final    Comment: (NOTE) The Xpert Xpress SARS-CoV-2/FLU/RSV plus assay is intended as an aid in the diagnosis of influenza from Nasopharyngeal swab specimens and should not be used as a sole basis for treatment. Nasal washings and aspirates are unacceptable for Xpert Xpress SARS-CoV-2/FLU/RSV testing.  Fact Sheet for Patients: BloggerCourse.com  Fact Sheet for Healthcare Providers: SeriousBroker.it  This test is not yet approved or cleared by the [place] FDA and has been authorized for detection and/or diagnosis of SARS-CoV-2 by FDA under an Emergency Use Authorization (EUA). This EUA will remain in effect (meaning this test can be used) for the duration of the COVID-19 declaration under Section 564(b)(1) of the Act, 21 U.S.C. section 360bbb-3(b)(1), unless the authorization is terminated or revoked.  Performed at [facility], [address] [place], [place] [address]          Radiology Studies: No results  found.      Scheduled Meds: . ondansetron  4 mg Oral TID AC & HS  . pantoprazole  40 mg Oral Daily  . senna-docusate  1 tablet Oral Once  . sucralfate  1 g Oral TID WC & HS   Continuous Infusions:    LOS: 3 days    Time spent: 35 minutes spent on chart review, discussion with nursing staff, consultants, updating family and interview/physical exam; more than 50% of that time was spent in counseling and/or coordination of care.    Alvira PhilipsEric J [place], DO [facility] Available via Epic secure chat 7am-7pm After these hours, please refer to coverage provider listed on [email or web address] 03/16/2020, 12:57 PM

## 2020-03-16 NOTE — Consult Note (Signed)
Reason for Consult/Chief Complaint: choledocholithiasis Consultant: [place], DO  Kiwan Gadsden. is an 69 y.o. male.   HPI: 60M s/p remote h/o B2 for ulcer disease in 1979 p/w abdominal pain and found to have choledocholithiasis. Underwent ERCP 12/10 with stent placement, but unable to perform sphincterotomy or clear the duct. Initial plan for repeat ERCP as outpatient, however patient continues to have symptoms of pain and nausea, and consideration was given to ERCP 12/17 vs inpatient transfer to [facility] for ERCP vs lap chole with CBD exploration. WBC uptrending, but LFTs all downtrending Reports central abdominal pain and nausea.   Past Medical History:  Diagnosis Date   Anemia    GERD (gastroesophageal reflux disease)     Past Surgical History:  Procedure Laterality Date   BILIARY STENT PLACEMENT  03/24/2020   Procedure: BILIARY STENT PLACEMENT;  Surgeon: Jeani Hawking, MD;  Location: [facility] ENDOSCOPY;  Service: Endoscopy;;   ERCP N/A 03/12/2020   Procedure: ENDOSCOPIC RETROGRADE CHOLANGIOPANCREATOGRAPHY (ERCP);  Surgeon: Jeani Hawking, MD;  Location: [facility] ENDOSCOPY;  Service: Endoscopy;  Laterality: N/A;   LUMBAR FUSION     STOMACH SURGERY      Family History  Problem Relation Age of Onset   Heart attack Mother    Emphysema Father    Cancer Father        lung    Social History:  reports that he quit smoking about 21 years ago. He has never used smokeless tobacco. He reports that he does not drink alcohol and does not use drugs.  Allergies:  Allergies  Allergen Reactions   Other Itching   Demerol [Meperidine] Rash    Medications: I have reviewed the patient's current medications.  Results for orders placed or performed during the hospital encounter of 03/05/2020 (from the past 48 hour(s))  CBC     Status: Abnormal   Collection Time: 03/15/20  2:45 AM  Result Value Ref Range   WBC 11.2 (H) 4.0 - 10.5 K/uL   RBC 4.99 4.22 - 5.81 MIL/uL   Hemoglobin 14.5 13.0 - 17.0 g/dL    HCT 03.4 91.7 - 91.5 %   MCV 87.0 80.0 - 100.0 fL   MCH 29.1 26.0 - 34.0 pg   MCHC 33.4 30.0 - 36.0 g/dL   RDW 05.6 97.9 - 48.0 %   Platelets 144 (L) 150 - 400 K/uL   nRBC 0.0 0.0 - 0.2 %    Comment: Performed at [facility], [address] [place], [place] [address]  Comprehensive metabolic panel     Status: Abnormal   Collection Time: 03/15/20  2:45 AM  Result Value Ref Range   Sodium 134 (L) 135 - 145 mmol/L   Potassium 3.9 3.5 - 5.1 mmol/L   Chloride 103 98 - 111 mmol/L   CO2 19 (L) 22 - 32 mmol/L   Glucose, Bld 73 70 - 99 mg/dL    Comment: Glucose reference range applies only to samples taken after fasting for at least 8 hours.   BUN 10 8 - 23 mg/dL   Creatinine, Ser 7.48 0.61 - 1.24 mg/dL   Calcium 8.4 (L) 8.9 - 10.3 mg/dL   Total Protein 5.9 (L) 6.5 - 8.1 g/dL   Albumin 3.1 (L) 3.5 - 5.0 g/dL   AST 46 (H) 15 - 41 U/L   ALT 100 (H) 0 - 44 U/L   Alkaline Phosphatase 123 38 - 126 U/L   Total Bilirubin 4.1 (H) 0.3 - 1.2 mg/dL  GFR, Estimated >60 >60 mL/min    Comment: (NOTE) Calculated using the CKD-EPI Creatinine Equation (2021)    Anion gap 12 5 - 15    Comment: Performed at [facility], [address] [place], [place] [address]  CBC     Status: Abnormal   Collection Time: 03/16/20  3:03 AM  Result Value Ref Range   WBC 13.9 (H) 4.0 - 10.5 K/uL   RBC 5.16 4.22 - 5.81 MIL/uL   Hemoglobin 15.5 13.0 - 17.0 g/dL   HCT 83.2 91.9 - 16.6 %   MCV 84.3 80.0 - 100.0 fL   MCH 30.0 26.0 - 34.0 pg   MCHC 35.6 30.0 - 36.0 g/dL   RDW 06.0 04.5 - 99.7 %   Platelets 157 150 - 400 K/uL   nRBC 0.0 0.0 - 0.2 %    Comment: Performed at [facility], [address], [place], [place] [address]  Comprehensive metabolic panel     Status: Abnormal   Collection Time: 03/16/20  3:03 AM  Result Value Ref Range   Sodium 133 (L) 135 - 145 mmol/L   Potassium 3.8 3.5 - 5.1 mmol/L   Chloride 102 98 - 111 mmol/L   CO2 18 (L) 22 - 32 mmol/L   Glucose, Bld 77 70 - 99  mg/dL    Comment: Glucose reference range applies only to samples taken after fasting for at least 8 hours.   BUN 11 8 - 23 mg/dL   Creatinine, Ser 3.95 0.61 - 1.24 mg/dL   Calcium 8.5 (L) 8.9 - 10.3 mg/dL   Total Protein 6.0 (L) 6.5 - 8.1 g/dL   Albumin 2.9 (L) 3.5 - 5.0 g/dL   AST 33 15 - 41 U/L   ALT 80 (H) 0 - 44 U/L   Alkaline Phosphatase 141 (H) 38 - 126 U/L   Total Bilirubin 3.1 (H) 0.3 - 1.2 mg/dL   GFR, Estimated >32 >02 mL/min    Comment: (NOTE) Calculated using the CKD-EPI Creatinine Equation (2021)    Anion gap 13 5 - 15    Comment: Performed at [facility], [address], [place], [place] [address]    No results found.  ROS 10 point review of systems is negative except as listed above in HPI.   Physical Exam Blood pressure 131/70, pulse 78, temperature 99.1 F (37.3 C), temperature source Oral, resp. rate 16, height 5\' 6"  (1.676 m), weight 69.9 kg, SpO2 96 %. Constitutional: well-developed, well-nourished HEENT: pupils equal, round, reactive to light, 30mm b/l, moist conjunctiva, external inspection of ears and nose normal, hearing intact Oropharynx: normal oropharyngeal mucosa, normal dentition Neck: no thyromegaly, trachea midline, no midline cervical tenderness to palpation Chest: breath sounds equal bilaterally, normal respiratory effort, no midline or lateral chest wall tenderness to palpation/deformity Abdomen: soft, NT, no bruising, no hepatosplenomegaly Extremities: 2+ radial and pedal pulses bilaterally, motor and sensation intact to bilateral UE and LE, no peripheral edema MSK: unable to assess gait/station, no clubbing/cyanosis of fingers/toes, normal ROM of all four extremities Skin: warm, dry, no rashes Psych: normal memory, normal mood/affect    Assessment/Plan: 78M with history of B2 and choledocholithiasis s/p ERCP with stent placement and without sphincterotomy.   Patient would definitely benefit from cholecystectomy, ideally after  clearance of CBD stones. Possible options are repeat ERCP later this week by Dr. 3m vs transfer to [facility] for repeat ERCP. If cholecystectomy is performed prior to repeat ERCP, patient could undergo post-op ERCP  vs intra-op CBD exploration which could convert the procedure from laparoscopic to open. Discussed all of these options with the patient and her verbalizes understanding. He appears clinically stable at the moment and will defer final decision-making to day team tomorrow based on options for repeat ERCP availability either here or at another facility.    Diamantina Monks, MD General and Trauma Surgery [facility] Surgery

## 2020-03-17 ENCOUNTER — Inpatient Hospital Stay (HOSPITAL_COMMUNITY): Payer: Medicare Other

## 2020-03-17 LAB — CBC
HCT: 43 % (ref 39.0–52.0)
Hemoglobin: 14.4 g/dL (ref 13.0–17.0)
MCH: 28.9 pg (ref 26.0–34.0)
MCHC: 33.5 g/dL (ref 30.0–36.0)
MCV: 86.3 fL (ref 80.0–100.0)
Platelets: 145 10*3/uL — ABNORMAL LOW (ref 150–400)
RBC: 4.98 MIL/uL (ref 4.22–5.81)
RDW: 12.6 % (ref 11.5–15.5)
WBC: 13.9 10*3/uL — ABNORMAL HIGH (ref 4.0–10.5)
nRBC: 0 % (ref 0.0–0.2)

## 2020-03-17 LAB — COMPREHENSIVE METABOLIC PANEL
ALT: 52 U/L — ABNORMAL HIGH (ref 0–44)
AST: 22 U/L (ref 15–41)
Albumin: 2.5 g/dL — ABNORMAL LOW (ref 3.5–5.0)
Alkaline Phosphatase: 121 U/L (ref 38–126)
Anion gap: 12 (ref 5–15)
BUN: 14 mg/dL (ref 8–23)
CO2: 17 mmol/L — ABNORMAL LOW (ref 22–32)
Calcium: 8.1 mg/dL — ABNORMAL LOW (ref 8.9–10.3)
Chloride: 105 mmol/L (ref 98–111)
Creatinine, Ser: 0.87 mg/dL (ref 0.61–1.24)
GFR, Estimated: >60 mL/min (ref >60)
Glucose, Bld: 59 mg/dL — ABNORMAL LOW (ref 70–99)
Potassium: 3.8 mmol/L (ref 3.5–5.1)
Sodium: 134 mmol/L — ABNORMAL LOW (ref 135–145)
Total Bilirubin: 2.6 mg/dL — ABNORMAL HIGH (ref 0.3–1.2)
Total Protein: 5.8 g/dL — ABNORMAL LOW (ref 6.5–8.1)

## 2020-03-17 MED ORDER — DEXTROSE-NACL 5-0.9 % IV SOLN: INTRAVENOUS | Status: DC

## 2020-03-17 MED ORDER — PROMETHAZINE HCL 25 MG/ML IJ SOLN
12.5000 mg | Freq: Four times a day (QID) | INTRAMUSCULAR | Status: DC | PRN
  Administered 2020-03-17: 08:00:00 12.5 mg via INTRAVENOUS
  Filled 2020-03-17: qty 1

## 2020-03-17 MED ORDER — METOCLOPRAMIDE HCL 5 MG/ML IJ SOLN
5.0000 mg | Freq: Four times a day (QID) | INTRAMUSCULAR | Status: DC | PRN
  Administered 2020-03-17 – 2020-03-26 (×13): 5 mg via INTRAVENOUS
  Filled 2020-03-17 (×13): qty 2

## 2020-03-17 MED ORDER — PROMETHAZINE HCL 25 MG/ML IJ SOLN: 6.2500 mg | Freq: Four times a day (QID) | INTRAMUSCULAR | Status: DC | PRN

## 2020-03-17 NOTE — Plan of Care (Signed)
Patient understand plan of care  

## 2020-03-17 NOTE — Progress Notes (Signed)
[facility] [place] [facility] Progress Note  4 Days Post-Op  Subjective: CC:  Denies pain or nausea currently, recently got pain and nausea meds. Early this AM he states he had epigastric pain and small-volume, non-bloody emesis. Denies BM.   Objective: Vital signs in last 24 hours: Temp:  [98.4 F (36.9 C)-99.9 F (37.7 C)] 99.9 F (37.7 C) (12/14 0438) Pulse Rate:  [64-78] 64 (12/14 0438) Resp:  [16-18] 18 (12/14 0438) BP: (126-136)/(60-70) 130/60 (12/14 0438) SpO2:  [94 %-100 %] 98 % (12/14 0438) Last BM Date: 03/15/20  Intake/Output from previous day: 12/13 0701 - 12/14 0700 In: 1181.9 [P.O.:180; I.V.:1001.9] Out: 250 [Urine:250] Intake/Output this shift: No intake/output data recorded.  PE: Gen:  Alert, NAD, pleasant Card:  Regular rate and rhythm, pedal pulses 2+ BL Pulm:  Normal effort, clear to auscultation bilaterally Abd: Soft, mild epigastric tenderness without guarding or peritonitis, non-distended, previous laparotomy scar appears well-healing Skin: warm and dry, no rashes  Psych: A&Ox3   Lab Results:  Recent Labs    03/16/20 0303 03/17/20 0323  WBC 13.9* 13.9*  HGB 15.5 14.4  HCT 43.5 43.0  PLT 157 145*   BMET Recent Labs    03/16/20 0303 03/17/20 0323  NA 133* 134*  K 3.8 3.8  CL 102 105  CO2 18* 17*  GLUCOSE 77 59*  BUN 11 14  CREATININE 0.83 0.87  CALCIUM 8.5* 8.1*   PT/INR No results for input(s): LABPROT, INR in the last 72 hours. CMP     Component Value Date/Time   NA 134 (L) 03/17/2020 0323   K 3.8 03/17/2020 0323   CL 105 03/17/2020 0323   CO2 17 (L) 03/17/2020 0323   GLUCOSE 59 (L) 03/17/2020 0323   BUN 14 03/17/2020 0323   CREATININE 0.87 03/17/2020 0323   CREATININE 0.93 11/19/2019 0842   CALCIUM 8.1 (L) 03/17/2020 0323   PROT 5.8 (L) 03/17/2020 0323   ALBUMIN 2.5 (L) 03/17/2020 0323   AST 22 03/17/2020 0323   ALT 52 (H) 03/17/2020 0323   ALKPHOS 121 03/17/2020 0323   BILITOT 2.6 (H) 03/17/2020 0323   GFRNONAA >60  03/17/2020 0323   GFRAA >60 11/11/2016 1600   Lipase     Component Value Date/Time   LIPASE 20 04/03/2020 1452       Studies/Results: DG Abd 1 View  Result Date: 03/17/2020 CLINICAL DATA:  Abdominal pain.  Vomiting. EXAM: ABDOMEN - 1 VIEW COMPARISON:  ERCP 03/27/2020.  CT 03/20/2020. FINDINGS: Surgical clips upper abdomen. Biliary stent noted in good anatomic position. No bowel distention or free air. Aortoiliac atherosclerotic vascular calcification. Lumbar spine degenerative changes scoliosis. Degenerative changes both hips. Stable deformity left hip. IMPRESSION: 1. Biliary stent noted in good anatomic position. No bowel distention or free air. 2. Aortoiliac atherosclerotic vascular disease. Electronically Signed   By: Marcello Moores  Register   On: 03/17/2020 07:34    Anti-infectives: Anti-infectives (From admission, onward)   Start     Dose/Rate Route Frequency Ordered Stop   03/10/2020 0600  piperacillin-tazobactam (ZOSYN) IVPB 3.375 g  Status:  Discontinued        3.375 g 12.5 mL/hr over 240 Minutes Intravenous Every 8 hours 03/28/2020 0046 03/15/20 1109   03/04/2020 2330  piperacillin-tazobactam (ZOSYN) IVPB 3.375 g        3.375 g 100 mL/hr over 30 Minutes Intravenous  Once 03/07/2020 2317 03/20/2020 0131      Assessment/Plan PMH Bilroth II for bleeding gastric ulcers   Symptomatic cholelithiasis Choledocholithiasis without evidence of  acute cholecystitis  S/p ERCP, placement of CBD stent 12/10 Dr. Benson Norway, unable to cannulate ampulla and perform biliary sphincterotomy  - AFVSS, WBC 13.9 (stable), LFT's downtrending (AST 22, ALT 52, Alk Phos 121, t.bili 2.6 from 3.1)  - this patient has persistent choledocholithiasis s/p ERCP. According to KUB this AM, CBD stent remains in good position. Noted Dr. Rush Landmark may repeat ERCP Friday. Failure of repeat ERCP would warrant transfer to a tertiary center for a 3rd ERCP vs surgical management of choledocholithiasis. patient will also benefit from  laparoscopic cholecystectomy this admission to prevent recurrence of choledocholithiasis. Will discuss timing of lap chole with MD.  the surgical treatment for choledocholithiasis would be laparoscopic vs open bile duct exploration. Again, will discuss with attending physician this week, Dr. Grandville Silos.     LOS: 4 days    Obie Dredge, [facility] Please see Amion for pager number during day hours 7:00am-4:30pm

## 2020-03-17 NOTE — Progress Notes (Addendum)
PROGRESS NOTE    Jorge Piaymond N Vesey Jr.  ZOX:[id number] DOB: 03-Jul-1950 DOA: 03/04/2020 PCP: Salley Scarleturham, Kawanta F, MD    Brief Narrative:  Jorge PiRaymond N Pollitt Jr. is a 69 year old male with past medical history significant for GERD who presented to the ED with nausea/vomiting and abdominal pain.  Patient reports symptoms onset 3 days ago, slightly improved but abruptly worsened overnight.  Patient reports vomitus is nonbloody nonbilious.  Continues with bowel movements.  Denies fevers, chills, night sweats, no chest pain, no palpitations, no shortness of breath, no weakness, no fatigue.  In the ED, temperature 98.2, HR 56, RR 20, BP 105/51, SPO2 98% on room air.  Sodium 138, potassium 4.3, glucose 133, creatinine 0.94, lipase 20, AST 137, ALT 93, total bilirubin 2.3.  WBC count 11.7, hemoglobin 15.6, platelets 210.  Covid-19/influenza A/B negative.  Urinalysis unremarkable.  CT abdomen/pelvis with contrast with interval development of moderate intra-/extrahepatic biliary dilation with multiple round filling defects mid/distal common duct suspicious for choledocholithiasis.  GI, Dr. Elnoria HowardHung was consulted.  Recommended MRCP and hospitalist admission for further evaluation and management.   Assessment & Plan:   Principal Problem:   Choledocholithiasis Active Problems:   GERD (gastroesophageal reflux disease)   Iron deficiency anemia   Elevated LFTs   Choledocholithiasis Patient presenting with progressive abdominal pain.  Noted to have elevated transaminitis and total bilirubin.  CT abdomen/pelvis with IV contrast with intra-/extrahepatic biliary duct dilation with filling defects within the common duct concerning for choledocholithiasis.  Underwent ERCP on 03/23/2020, 3 large common bile duct stones noted, unable to perform a sphincterotomy and stone extraction due to anatomy s/p Billroth II; and had stent placement  --Gastroenterology, Dr. Elnoria HowardHung; and General Surgery following; appreciate assistance --AST  137>158>84>46>33>22 --ALT 93>178>136>100>80>52 --AP 124>126>107>123>141>121 --Tbili 2.3>5.3>5.9>4.1>3.1>2.6 --WBC 11.7>14.6>10.0>11.2>13.9 --Zosyn discontinued by GI on 12/12 --Possible repeat ERCP on 12/17 by Dr. Meridee ScoreMansouraty --Gen Surgery recommends repeat ERCP and transfer to tertiary center vs surgical management of choledocholithiasis --[facility] --Pain control and antiemetics --D5NS at 100 mL's per hour given poor oral intake --Continue to follow LFTs and CBC daily  Addendum 1038: [facility], GI service with Dr. Hildred LaserKelly Hawthorne and Dr. Jamse MeadBarron; no indication for transfer at this time given improving LFTs with stent adequately placed. They recommended surgical consultation if concern for cholecystitis, which General Surgery is currently consulted for [facility]. [facility] GI service would be happy to see the patient for outpatient consultation in the few weeks if needed.  GERD --Carafate 1 g 4 times daily --Protonix 40 mg p.o. daily  Iron deficiency anemia On ferrous sulfate 325 mg daily at home.   DVT prophylaxis: SCDs Code Status: Full code Family Communication: No family present at bedside this morning, updated spouse extensively yesterday at bedside; will update once she arrives to the hospital this afternoon  Disposition Plan:  Status is: Inpatient  Remains inpatient appropriate because:Ongoing active pain requiring inpatient pain management, Ongoing diagnostic testing needed not appropriate for outpatient work up, IV treatments appropriate due to intensity of illness or inability to take PO and Inpatient level of care appropriate due to severity of illness   Dispo: The patient is from: Home              Anticipated d/c is to: Home              Anticipated d/c date is: 1 day  Patient currently is not medically stable to d/c.   Consultants:   Gastroenterology, Dr.  Elnoria Howard  Procedures:   ERCP 12/10  Antimicrobials:   Zosyn 12/9 - 12/12   Subjective: Patient seen and examined at bedside, sleeping, but easily arousable.  Continues with persistent nausea despite scheduled Zofran and Carafate; and changed to Phenergan overnight by night coverage physician.  Continues with poor oral intake, on IV fluids.  Patient without any other complaints or concerns at this time.  Denies headache, no chest pain, no palpitations, no shortness of breath, no cough/congestion.  No acute events overnight per nursing staff.  Objective: Vitals:   03/16/20 1212 03/16/20 1803 03/16/20 2342 03/17/20 0438  BP: 131/70 136/63 126/61 130/60  Pulse: 78 76 67 64  Resp: 16 16 17 18   Temp: 99.1 F (37.3 C) 99.2 F (37.3 C) 98.4 F (36.9 C) 99.9 F (37.7 C)  TempSrc: Oral Oral Oral Oral  SpO2: 96% 94% 100% 98%  Weight:      Height:        Intake/Output Summary (Last 24 hours) at 03/17/2020 1007 Last data filed at 03/17/2020 0600 Gross per 24 hour  Intake 1121.88 ml  Output 250 ml  Net 871.88 ml   Filed Weights   03/11/2020 1442  Weight: 69.9 kg    Examination:  General exam: Calm, ill in appearance Respiratory system: Clear to auscultation. Respiratory effort normal.  On room air Cardiovascular system: S1 & S2 heard, RRR. No JVD, murmurs, rubs, gallops or clicks. No pedal edema. Gastrointestinal system: Abdomen is nondistended, soft, mild epigastric/right upper quadrant tenderness, No organomegaly or masses felt. Normal bowel sounds heard. Central nervous system: Alert and oriented. No focal neurological deficits. Extremities: Symmetric 5 x 5 power. Skin: No rashes, lesions or ulcers Psychiatry: Judgement and insight appear normal. Mood & affect appropriate.     Data Reviewed: I have personally reviewed following labs and imaging studies  CBC: Recent Labs  Lab 03/16/2020 0453 03/14/20 0209 03/15/20 0245 03/16/20 0303 03/17/20 0323  WBC 14.6* 10.0 11.2*  13.9* 13.9*  HGB 14.4 13.5 14.5 15.5 14.4  HCT 41.0 40.4 43.4 43.5 43.0  MCV 85.4 87.3 87.0 84.3 86.3  PLT 173 132* 144* 157 145*   Basic Metabolic Panel: Recent Labs  Lab 03/08/2020 0453 03/14/20 0209 03/15/20 0245 03/16/20 0303 03/17/20 0323  NA 138 137 134* 133* 134*  K 4.4 4.6 3.9 3.8 3.8  CL 106 106 103 102 105  CO2 21* 21* 19* 18* 17*  GLUCOSE 136* 146* 73 77 59*  BUN 11 14 10 11 14   CREATININE 1.03 1.07 0.92 0.83 0.87  CALCIUM 8.5* 8.5* 8.4* 8.5* 8.1*  MG  --  2.1  --   --   --    GFR: Estimated Creatinine Clearance: 71.3 mL/min (by C-G formula based on SCr of 0.87 mg/dL). Liver Function Tests: Recent Labs  Lab 03/22/2020 0453 03/14/20 0209 03/15/20 0245 03/16/20 0303 03/17/20 0323  AST 158* 84* 46* 33 22  ALT 178* 136* 100* 80* 52*  ALKPHOS 126 107 123 141* 121  BILITOT 5.3* 5.9* 4.1* 3.1* 2.6*  PROT 5.9* 5.7* 5.9* 6.0* 5.8*  ALBUMIN 3.3* 3.1* 3.1* 2.9* 2.5*   Recent Labs  Lab 03/09/2020 1452  LIPASE 20   No results for input(s): AMMONIA in the last 168 hours. Coagulation Profile: No results for input(s): INR, PROTIME in the last 168 hours. Cardiac Enzymes: No results for input(s): CKTOTAL, CKMB, CKMBINDEX, TROPONINI in the last 168 hours. BNP (  last 3 results) No results for input(s): PROBNP in the last 8760 hours. HbA1C: No results for input(s): HGBA1C in the last 72 hours. CBG: No results for input(s): GLUCAP in the last 168 hours. Lipid Profile: No results for input(s): CHOL, HDL, LDLCALC, TRIG, CHOLHDL, LDLDIRECT in the last 72 hours. Thyroid Function Tests: No results for input(s): TSH, T4TOTAL, FREET4, T3FREE, THYROIDAB in the last 72 hours. Anemia Panel: No results for input(s): VITAMINB12, FOLATE, FERRITIN, TIBC, IRON, RETICCTPCT in the last 72 hours. Sepsis Labs: No results for input(s): PROCALCITON, LATICACIDVEN in the last 168 hours.  Recent Results (from the past 240 hour(s))  Resp Panel by RT-PCR (Flu A&B, Covid) Nasopharyngeal Swab      Status: None   Collection Time: 03/27/2020 12:30 AM   Specimen: Nasopharyngeal Swab; Nasopharyngeal(NP) swabs in vial transport medium  Result Value Ref Range Status   SARS Coronavirus 2 by RT PCR NEGATIVE NEGATIVE Final    Comment: (NOTE) SARS-CoV-2 target nucleic acids are NOT DETECTED.  The SARS-CoV-2 RNA is generally detectable in upper respiratory specimens during the acute phase of infection. The lowest concentration of SARS-CoV-2 viral copies this assay can detect is 138 copies/mL. A negative result does not preclude SARS-Cov-2 infection and should not be used as the sole basis for treatment or other patient management decisions. A negative result may occur with  improper specimen collection/handling, submission of specimen other than nasopharyngeal swab, presence of viral mutation(s) within the areas targeted by this assay, and inadequate number of viral copies(<138 copies/mL). A negative result must be combined with clinical observations, patient history, and epidemiological information. The expected result is Negative.  Fact Sheet for Patients:  BloggerCourse.com  Fact Sheet for Healthcare Providers:  SeriousBroker.it  This test is no t yet approved or cleared by the [place] FDA and  has been authorized for detection and/or diagnosis of SARS-CoV-2 by FDA under an Emergency Use Authorization (EUA). This EUA will remain  in effect (meaning this test can be used) for the duration of the COVID-19 declaration under Section 564(b)(1) of the Act, 21 U.S.C.section 360bbb-3(b)(1), unless the authorization is terminated  or revoked sooner.       Influenza A by PCR NEGATIVE NEGATIVE Final   Influenza B by PCR NEGATIVE NEGATIVE Final    Comment: (NOTE) The Xpert Xpress SARS-CoV-2/FLU/RSV plus assay is intended as an aid in the diagnosis of influenza from Nasopharyngeal swab specimens and should not be used as a sole basis  for treatment. Nasal washings and aspirates are unacceptable for Xpert Xpress SARS-CoV-2/FLU/RSV testing.  Fact Sheet for Patients: BloggerCourse.com  Fact Sheet for Healthcare Providers: SeriousBroker.it  This test is not yet approved or cleared by the [place] FDA and has been authorized for detection and/or diagnosis of SARS-CoV-2 by FDA under an Emergency Use Authorization (EUA). This EUA will remain in effect (meaning this test can be used) for the duration of the COVID-19 declaration under Section 564(b)(1) of the Act, 21 U.S.C. section 360bbb-3(b)(1), unless the authorization is terminated or revoked.  Performed at [facility], [address], [place], [place] [address]          Radiology Studies: DG Abd 1 View  Result Date: 03/17/2020 CLINICAL DATA:  Abdominal pain.  Vomiting. EXAM: ABDOMEN - 1 VIEW COMPARISON:  ERCP 03/31/2020.  CT March 17, 2020. FINDINGS: Surgical clips upper abdomen. Biliary stent noted in good anatomic position. No bowel distention or free air. Aortoiliac atherosclerotic vascular calcification. Lumbar spine degenerative changes scoliosis. Degenerative changes both  hips. Stable deformity left hip. IMPRESSION: 1. Biliary stent noted in good anatomic position. No bowel distention or free air. 2. Aortoiliac atherosclerotic vascular disease. Electronically Signed   By: Maisie Fus  Register   On: 03/17/2020 07:34        Scheduled Meds: . pantoprazole  40 mg Oral Daily  . senna-docusate  1 tablet Oral Once  . sucralfate  1 g Oral TID WC & HS   Continuous Infusions: . dextrose 5 % and 0.9% NaCl 100 mL/hr at 03/17/20 0756     LOS: 4 days    Time spent: 35 minutes spent on chart review, discussion with nursing staff, consultants, updating family and interview/physical exam; more than 50% of that time was spent in counseling and/or coordination of care.    Alvira Philips [place], DO [facility] Available via Epic secure chat 7am-7pm After these hours, please refer to coverage provider listed on [email or web address] 03/17/2020, 10:07 AM

## 2020-03-17 NOTE — Progress Notes (Signed)
Subjective: No new complaints.  Hiccups still causing him problems.  Objective: Vital signs in last 24 hours: Temp:  [98.4 F (36.9 C)-99.9 F (37.7 C)] 99.9 F (37.7 C) (12/14 0438) Pulse Rate:  [64-78] 64 (12/14 0438) Resp:  [16-18] 18 (12/14 0438) BP: (126-136)/(60-70) 130/60 (12/14 0438) SpO2:  [94 %-100 %] 98 % (12/14 0438) Last BM Date: 03/15/20  Intake/Output from previous day: 12/13 0701 - 12/14 0700 In: 1033 [P.O.:180; I.V.:853] Out: 250 [Urine:250] Intake/Output this shift: Total I/O In: 820.8 [I.V.:820.8] Out: 250 [Urine:250]  General appearance: alert and mild distress GI: soft, non-tender; bowel sounds normal; no masses,  no organomegaly  Lab Results: Recent Labs    03/15/20 0245 03/16/20 0303 03/17/20 0323  WBC 11.2* 13.9* 13.9*  HGB 14.5 15.5 14.4  HCT 43.4 43.5 43.0  PLT 144* 157 145*   BMET Recent Labs    03/15/20 0245 03/16/20 0303 03/17/20 0323  NA 134* 133* 134*  K 3.9 3.8 3.8  CL 103 102 105  CO2 19* 18* 17*  GLUCOSE 73 77 59*  BUN 10 11 14   CREATININE 0.92 0.83 0.87  CALCIUM 8.4* 8.5* 8.1*   LFT Recent Labs    03/17/20 0323  PROT 5.8*  ALBUMIN 2.5*  AST 22  ALT 52*  ALKPHOS 121  BILITOT 2.6*   PT/INR No results for input(s): LABPROT, INR in the last 72 hours. Hepatitis Panel No results for input(s): HEPBSAG, HCVAB, HEPAIGM, HEPBIGM in the last 72 hours. C-Diff No results for input(s): CDIFFTOX in the last 72 hours. Fecal Lactopherrin No results for input(s): FECLLACTOFRN in the last 72 hours.  Studies/Results: No results found.  Medications:  Scheduled: . pantoprazole  40 mg Oral Daily  . senna-docusate  1 tablet Oral Once  . sucralfate  1 g Oral TID WC & HS   Continuous: . sodium chloride 75 mL/hr at 03/17/20 0400    Assessment/Plan: 1) Choledocholithiasis. 2) ? Cholelithiasis. 3) Leukocytosis. 4) Low grade fever.   There is a suspicion that the patient has a symptomatic gallbladder.  The placement of  the biliary stent, per his report, did help, but he does not truly feel significantly better.  The improvement in his liver enzymes and bilirubin were much slower than anticipated for choledocholithiasis.  It is possible that the stent is out of position and a KUB will be ordered.  If the stent is still in place, I feel that he will benefit with a lap chole and then to have the stones extracted post-operatively.  I spoke with Dr. 03/06/2020 about the patient at length.  He agrees that a lap chole will help.  The hope is that the lap chole will resolve his hiccups and leukocytosis.  If his symptoms persist, Dr. Meridee Score may have time to perform a repeat ERCP this coming Friday.  Currently he is not on any antibiotics.  I am appreciative of Dr. Wednesday consultation.  Plan: 1) KUB. 2) ? Restarting antibiotics. 3) Await day team surgical input.  LOS: 4 days   Detta Mellin D 03/17/2020, 6:15 AM

## 2020-03-18 LAB — COMPREHENSIVE METABOLIC PANEL
ALT: 38 U/L (ref 0–44)
AST: 18 U/L (ref 15–41)
Albumin: 2.2 g/dL — ABNORMAL LOW (ref 3.5–5.0)
Alkaline Phosphatase: 102 U/L (ref 38–126)
Anion gap: 7 (ref 5–15)
BUN: 7 mg/dL — ABNORMAL LOW (ref 8–23)
CO2: 23 mmol/L (ref 22–32)
Calcium: 7.9 mg/dL — ABNORMAL LOW (ref 8.9–10.3)
Chloride: 107 mmol/L (ref 98–111)
Creatinine, Ser: 0.8 mg/dL (ref 0.61–1.24)
GFR, Estimated: >60 mL/min (ref >60)
Glucose, Bld: 118 mg/dL — ABNORMAL HIGH (ref 70–99)
Potassium: 3.2 mmol/L — ABNORMAL LOW (ref 3.5–5.1)
Sodium: 137 mmol/L (ref 135–145)
Total Bilirubin: 1.7 mg/dL — ABNORMAL HIGH (ref 0.3–1.2)
Total Protein: 5.5 g/dL — ABNORMAL LOW (ref 6.5–8.1)

## 2020-03-18 LAB — CBC
HCT: 39.9 % (ref 39.0–52.0)
Hemoglobin: 13.4 g/dL (ref 13.0–17.0)
MCH: 28.9 pg (ref 26.0–34.0)
MCHC: 33.6 g/dL (ref 30.0–36.0)
MCV: 86 fL (ref 80.0–100.0)
Platelets: 156 10*3/uL (ref 150–400)
RBC: 4.64 MIL/uL (ref 4.22–5.81)
RDW: 12.6 % (ref 11.5–15.5)
WBC: 12.4 10*3/uL — ABNORMAL HIGH (ref 4.0–10.5)
nRBC: 0 % (ref 0.0–0.2)

## 2020-03-18 MED ORDER — POTASSIUM CHLORIDE 10 MEQ/100ML IV SOLN
10.0000 meq | INTRAVENOUS | Status: AC
  Administered 2020-03-18 (×3): 10 meq via INTRAVENOUS
  Filled 2020-03-18 (×3): qty 100

## 2020-03-18 MED ORDER — BACLOFEN 10 MG PO TABS
5.0000 mg | ORAL_TABLET | Freq: Three times a day (TID) | ORAL | Status: AC
  Administered 2020-03-18 (×3): 5 mg via ORAL
  Filled 2020-03-18 (×3): qty 1

## 2020-03-18 MED ORDER — CEFAZOLIN SODIUM-DEXTROSE 2-4 GM/100ML-% IV SOLN
2.0000 g | INTRAVENOUS | Status: AC
  Filled 2020-03-18 (×2): qty 100

## 2020-03-18 NOTE — Progress Notes (Addendum)
Progress Note  5 Days Post-Op  Subjective: Patient reports abdominal pain is still present at times but not severe and very much improved from presentation. He still has some intermittent nausea and is spitting up at times. He reports this has happened 3-4 times in the last 24 hrs. Passing flatus and having bowel function.   Objective: Vital signs in last 24 hours: Temp:  [98.1 F (36.7 C)-100.2 F (37.9 C)] 99.5 F (37.5 C) (12/15 0510) Pulse Rate:  [59-67] 62 (12/15 0510) Resp:  [17-18] 18 (12/15 0510) BP: (133-159)/(51-65) 140/65 (12/15 0510) SpO2:  [94 %-99 %] 98 % (12/15 0510) Last BM Date: 03/16/20  Intake/Output from previous day: 12/14 0701 - 12/15 0700 In: 771.7 [I.V.:771.7] Out: 300 [Urine:300] Intake/Output this shift: No intake/output data recorded.  PE: General: pleasant, WD, elderly male who is laying in bed in NAD HEENT:  Sclera are anicteric.   Heart: regular, rate, and rhythm.   Lungs: CTAB, no wheezes, rhonchi, or rales noted.  Respiratory effort nonlabored Abd: soft, NT, ND, +BS, no masses, hernias, or organomegaly MS: all 4 extremities are symmetrical with no cyanosis, clubbing, or edema. Skin: warm and dry with no masses, lesions, or rashes Psych: A&Ox3 with an appropriate affect.    Lab Results:  Recent Labs    03/17/20 0323 03/18/20 0330  WBC 13.9* 12.4*  HGB 14.4 13.4  HCT 43.0 39.9  PLT 145* 156   BMET Recent Labs    03/17/20 0323 03/18/20 0330  NA 134* 137  K 3.8 3.2*  CL 105 107  CO2 17* 23  GLUCOSE 59* 118*  BUN 14 7*  CREATININE 0.87 0.80  CALCIUM 8.1* 7.9*   PT/INR No results for input(s): LABPROT, INR in the last 72 hours. CMP     Component Value Date/Time   NA 137 03/18/2020 0330   K 3.2 (L) 03/18/2020 0330   CL 107 03/18/2020 0330   CO2 23 03/18/2020 0330   GLUCOSE 118 (H) 03/18/2020 0330   BUN 7 (L) 03/18/2020 0330   CREATININE 0.80 03/18/2020 0330   CREATININE 0.93 11/19/2019 0842   CALCIUM 7.9 (L)  03/18/2020 0330   PROT 5.5 (L) 03/18/2020 0330   ALBUMIN 2.2 (L) 03/18/2020 0330   AST 18 03/18/2020 0330   ALT 38 03/18/2020 0330   ALKPHOS 102 03/18/2020 0330   BILITOT 1.7 (H) 03/18/2020 0330   GFRNONAA >60 03/18/2020 0330   GFRAA >60 11/11/2016 1600   Lipase     Component Value Date/Time   LIPASE 20 03/23/2020 1452       Studies/Results: DG Abd 1 View  Result Date: 03/17/2020 CLINICAL DATA:  Abdominal pain.  Vomiting. EXAM: ABDOMEN - 1 VIEW COMPARISON:  ERCP 03/30/2020.  CT 03/29/2020. FINDINGS: Surgical clips upper abdomen. Biliary stent noted in good anatomic position. No bowel distention or free air. Aortoiliac atherosclerotic vascular calcification. Lumbar spine degenerative changes scoliosis. Degenerative changes both hips. Stable deformity left hip. IMPRESSION: 1. Biliary stent noted in good anatomic position. No bowel distention or free air. 2. Aortoiliac atherosclerotic vascular disease. Electronically Signed   By: Maisie Fus  Register   On: 03/17/2020 07:34    Anti-infectives: Anti-infectives (From admission, onward)   Start     Dose/Rate Route Frequency Ordered Stop   03/31/2020 0600  piperacillin-tazobactam (ZOSYN) IVPB 3.375 g  Status:  Discontinued        3.375 g 12.5 mL/hr over 240 Minutes Intravenous Every 8 hours 03/12/2020 0046 03/15/20 1109   03/30/2020 2330  piperacillin-tazobactam (  ZOSYN) IVPB 3.375 g        3.375 g 100 mL/hr over 30 Minutes Intravenous  Once 03/23/2020 2317 03/28/2020 0131       Assessment/Plan PMH Bilroth II for bleeding gastric ulcers   Symptomatic cholelithiasis Choledocholithiasis without evidence of acute cholecystitis  S/p ERCP, placement of CBD stent 12/10 Dr. Benson Norway, unable to cannulate ampulla and perform biliary sphincterotomy  - AFVSS, WBC 12.4, LFT's downtrending (AST 18, ALT 38, Alk Phos 102, t.bili 1.7 from 4.1)  - this patient has persistent choledocholithiasis s/p ERCP. According to KUB yesterday AM, CBD stent remains in good  position. Noted Dr. Rush Landmark may repeat ERCP Friday. Failure of repeat ERCP would potentially warrant transfer to a tertiary center for a 3rd ERCP vs surgical management of choledocholithiasis. Currently surgical management would have to be open CBD exploration as laparoscopic exploration can't be done with stent in place.   FEN: reg diet, IVF VTE: SCDs ID: no current abx, WBC 12  LOS: 5 days    Norm Parcel , [facility] 03/18/2020, 8:12 AM Please see Amion for pager number during day hours 7:00am-4:30pm

## 2020-03-18 NOTE — Progress Notes (Signed)
Patient ID: [name]., male   DOB: Jul 12, 1950, 69 y.o.   MRN: [id number] I spoke with [name]. He feels they will be able to handle the CBD stones endoscopically and he thinks Mr. Linn will benefit from lap chole in the interim. I agree this may help his nausea. I have scheduled it for tomorrow. I discussed the procedure, risks, and benefits with him. He agrees. Violeta Gelinas, MD, MPH, FACS Please use [email or web address] to contact on call provider

## 2020-03-18 NOTE — Progress Notes (Signed)
Progress Note    Jorge Pi.  CHY:[id number] DOB: Sep 01, 1950  DOA: 03/11/2020 PCP: Salley Scarlet, MD    Brief Narrative:     Medical records reviewed and are as summarized below:  Jorge Pi. is an 69 y.o. male with past medical history significant for GERD who presented with progressive abdominal pain.  Noted to have elevated transaminitis and total bilirubin.  CT abdomen/pelvis with IV contrast with intra-/extrahepatic biliary duct dilation with filling defects within the common duct concerning for choledocholithiasis.  Underwent ERCP on 03/08/2020, 3 large common bile duct stones noted, unable to perform a sphincterotomy and stone extraction due to anatomy s/p Billroth II; and had stent placement.  Assessment/Plan:   Principal Problem:   Choledocholithiasis Active Problems:   GERD (gastroesophageal reflux disease)   Iron deficiency anemia   Elevated LFTs   Choledocholithiasis --Gastroenterology, [name]; and General Surgery following; appreciate assistance --AST 137>158>84>46>33>22 --ALT 93>178>136>100>80>52 --AP 124>126>107>123>141>121 --Tbili 2.3>5.3>5.9>4.1>3.1>2.6 --WBC 11.7>14.6>10.0>11.2>13.9 --Zosyn discontinued by GI on 12/12 --repeat ERCP on 12/17 by Dr. Meridee Score --Gen Surgery recommends repeat ERCP and transfer to tertiary center vs surgical management of choledocholithiasis: per Dr. [place]: Received return call from [facility], GI service with Dr. Hildred Laser and Dr. Jamse Mead; no indication for transfer at this time given improving LFTs with stent adequately placed. They recommended surgical consultation if concern for cholecystitis, which General Surgery is currently consulted for here at [facility]. [facility] GI service would be happy to see the patient for outpatient consultation in the few weeks if needed. --Pain control and antiemetics --D5NS given poor oral intake --Continue to follow LFTs and CBC daily    hypokalemia -replete  GERD --Carafate 1 g 4 times daily --Protonix 40 mg p.o. daily  Iron deficiency anemia -ferrous sulfate 325 mg daily at home.  Hiccups -baclofen trial   Family Communication/Anticipated D/C date and plan/Code Status   DVT prophylaxis: Lovenox ordered. Code Status: Full Code.  Disposition Plan: Status is: Inpatient  Remains inpatient appropriate because:Inpatient level of care appropriate due to severity of illness   Dispo:  Patient From: Home  Planned Disposition: Home  Expected discharge date: 03/21/2020  Medically stable for discharge: No          Medical Consultants:    GI  GS  Subjective:   Is bothered by hiccups  Objective:    Vitals:   03/17/20 1156 03/17/20 1642 03/17/20 2341 03/18/20 0510  BP: (!) 136/51 (!) 159/64 133/64 140/65  Pulse: 67 (!) 59 62 62  Resp: 17 18 18 18   Temp: 100.2 F (37.9 C) 98.1 F (36.7 C) 99.3 F (37.4 C) 99.5 F (37.5 C)  TempSrc: Oral Oral Oral Oral  SpO2: 98% 94% 99% 98%  Weight:      Height:        Intake/Output Summary (Last 24 hours) at 03/18/2020 1205 Last data filed at 03/17/2020 2144 Gross per 24 hour  Intake 771.67 ml  Output 300 ml  Net 471.67 ml   Filed Weights   03/08/2020 1442  Weight: 69.9 kg    Exam:  General: Appearance:    Well developed, well nourished male who appears uncomfortable     Lungs:     respirations unlabored  Heart:    Normal heart rate. Normal rhythm. No murmurs, rubs, or gallops.   MS:   All extremities are intact.   Neurologic:   Awake, alert, oriented x 3    Data Reviewed:   I  have personally reviewed following labs and imaging studies:  Labs: Labs show the following:   Basic Metabolic Panel: Recent Labs  Lab 03/14/20 0209 03/15/20 0245 03/16/20 0303 03/17/20 0323 03/18/20 0330  NA 137 134* 133* 134* 137  K 4.6 3.9 3.8 3.8 3.2*  CL 106 103 102 105 107  CO2 21* 19* 18* 17* 23  GLUCOSE 146* 73 77 59* 118*  BUN 14 10 11 14  7*   CREATININE 1.07 0.92 0.83 0.87 0.80  CALCIUM 8.5* 8.4* 8.5* 8.1* 7.9*  MG 2.1  --   --   --   --    GFR Estimated Creatinine Clearance: 77.5 mL/min (by C-G formula based on SCr of 0.8 mg/dL). Liver Function Tests: Recent Labs  Lab 03/14/20 0209 03/15/20 0245 03/16/20 0303 03/17/20 0323 03/18/20 0330  AST 84* 46* 33 22 18  ALT 136* 100* 80* 52* 38  ALKPHOS 107 123 141* 121 102  BILITOT 5.9* 4.1* 3.1* 2.6* 1.7*  PROT 5.7* 5.9* 6.0* 5.8* 5.5*  ALBUMIN 3.1* 3.1* 2.9* 2.5* 2.2*   Recent Labs  Lab 04/01/2020 1452  LIPASE 20   No results for input(s): AMMONIA in the last 168 hours. Coagulation profile No results for input(s): INR, PROTIME in the last 168 hours.  CBC: Recent Labs  Lab 03/14/20 0209 03/15/20 0245 03/16/20 0303 03/17/20 0323 03/18/20 0330  WBC 10.0 11.2* 13.9* 13.9* 12.4*  HGB 13.5 14.5 15.5 14.4 13.4  HCT 40.4 43.4 43.5 43.0 39.9  MCV 87.3 87.0 84.3 86.3 86.0  PLT 132* 144* 157 145* 156   Cardiac Enzymes: No results for input(s): CKTOTAL, CKMB, CKMBINDEX, TROPONINI in the last 168 hours. BNP (last 3 results) No results for input(s): PROBNP in the last 8760 hours. CBG: No results for input(s): GLUCAP in the last 168 hours. D-Dimer: No results for input(s): DDIMER in the last 72 hours. Hgb A1c: No results for input(s): HGBA1C in the last 72 hours. Lipid Profile: No results for input(s): CHOL, HDL, LDLCALC, TRIG, CHOLHDL, LDLDIRECT in the last 72 hours. Thyroid function studies: No results for input(s): TSH, T4TOTAL, T3FREE, THYROIDAB in the last 72 hours.  Invalid input(s): FREET3 Anemia work up: No results for input(s): VITAMINB12, FOLATE, FERRITIN, TIBC, IRON, RETICCTPCT in the last 72 hours. Sepsis Labs: Recent Labs  Lab 03/15/20 0245 03/16/20 0303 03/17/20 0323 03/18/20 0330  WBC 11.2* 13.9* 13.9* 12.4*    Microbiology Recent Results (from the past 240 hour(s))  Resp Panel by RT-PCR (Flu A&B, Covid) Nasopharyngeal Swab     Status:  None   Collection Time: 03/25/2020 12:30 AM   Specimen: Nasopharyngeal Swab; Nasopharyngeal(NP) swabs in vial transport medium  Result Value Ref Range Status   SARS Coronavirus 2 by RT PCR NEGATIVE NEGATIVE Final    Comment: (NOTE) SARS-CoV-2 target nucleic acids are NOT DETECTED.  The SARS-CoV-2 RNA is generally detectable in upper respiratory specimens during the acute phase of infection. The lowest concentration of SARS-CoV-2 viral copies this assay can detect is 138 copies/mL. A negative result does not preclude SARS-Cov-2 infection and should not be used as the sole basis for treatment or other patient management decisions. A negative result may occur with  improper specimen collection/handling, submission of specimen other than nasopharyngeal swab, presence of viral mutation(s) within the areas targeted by this assay, and inadequate number of viral copies(<138 copies/mL). A negative result must be combined with clinical observations, patient history, and epidemiological information. The expected result is Negative.  Fact Sheet for Patients:  14/10/21  Fact Sheet for Healthcare Providers:  SeriousBroker.it  This test is no t yet approved or cleared by the [place] FDA and  has been authorized for detection and/or diagnosis of SARS-CoV-2 by FDA under an Emergency Use Authorization (EUA). This EUA will remain  in effect (meaning this test can be used) for the duration of the COVID-19 declaration under Section 564(b)(1) of the Act, 21 U.S.C.section 360bbb-3(b)(1), unless the authorization is terminated  or revoked sooner.       Influenza A by PCR NEGATIVE NEGATIVE Final   Influenza B by PCR NEGATIVE NEGATIVE Final    Comment: (NOTE) The Xpert Xpress SARS-CoV-2/FLU/RSV plus assay is intended as an aid in the diagnosis of influenza from Nasopharyngeal swab specimens and should not be used as a sole basis for  treatment. Nasal washings and aspirates are unacceptable for Xpert Xpress SARS-CoV-2/FLU/RSV testing.  Fact Sheet for Patients: BloggerCourse.com  Fact Sheet for Healthcare Providers: SeriousBroker.it  This test is not yet approved or cleared by the [place] FDA and has been authorized for detection and/or diagnosis of SARS-CoV-2 by FDA under an Emergency Use Authorization (EUA). This EUA will remain in effect (meaning this test can be used) for the duration of the COVID-19 declaration under Section 564(b)(1) of the Act, 21 U.S.C. section 360bbb-3(b)(1), unless the authorization is terminated or revoked.  Performed at [facility], [address], [place], [place] [address]     Procedures and diagnostic studies:  DG Abd 1 View  Result Date: 03/17/2020 CLINICAL DATA:  Abdominal pain.  Vomiting. EXAM: ABDOMEN - 1 VIEW COMPARISON:  ERCP 03/16/2020.  CT 03/23/2020. FINDINGS: Surgical clips upper abdomen. Biliary stent noted in good anatomic position. No bowel distention or free air. Aortoiliac atherosclerotic vascular calcification. Lumbar spine degenerative changes scoliosis. Degenerative changes both hips. Stable deformity left hip. IMPRESSION: 1. Biliary stent noted in good anatomic position. No bowel distention or free air. 2. Aortoiliac atherosclerotic vascular disease. Electronically Signed   By: Maisie Fus  Register   On: 03/17/2020 07:34    Medications:   . baclofen  5 mg Oral TID  . pantoprazole  40 mg Oral Daily  . sucralfate  1 g Oral TID WC & HS   Continuous Infusions: . dextrose 5 % and 0.9% NaCl 100 mL/hr at 03/18/20 0516  . potassium chloride       LOS: 5 days   Joseph Art  [facility]   How to contact the [facility] Attending or Consulting provider 7A - 7P or covering provider during after hours 7P -7A, for this patient?  1. Check the care team in [facility] and look for a) attending/consulting TRH  provider listed and b) the [facility] team listed 2. Log into [email or web address] and use [place]'s universal password to access. If you do not have the password, please contact the hospital operator. 3. Locate the [facility] provider you are looking for under [facility] and page to a number that you can be directly reached. 4. If you still have difficulty reaching the provider, please page the [facility] (Director on Call) for the Hospitalists listed on amion for assistance.  03/18/2020, 12:05 PM

## 2020-03-18 NOTE — Care Management Important Message (Signed)
Important Message  Patient Details  Name: Jorge Evans. MRN: [id number] Date of Birth: Aug 02, 1950   Medicare Important Message Given:  Yes     Oralia Rud Braelyn Jenson 03/18/2020, 2:24 PM

## 2020-03-19 ENCOUNTER — Inpatient Hospital Stay (HOSPITAL_COMMUNITY): Payer: Medicare Other | Admitting: Certified Registered Nurse Anesthetist

## 2020-03-19 ENCOUNTER — Encounter (HOSPITAL_COMMUNITY): Payer: Self-pay | Admitting: Internal Medicine

## 2020-03-19 ENCOUNTER — Encounter (HOSPITAL_COMMUNITY): Admission: EM | Disposition: E | Payer: Self-pay | Source: Home / Self Care | Attending: Internal Medicine

## 2020-03-19 HISTORY — PX: CHOLECYSTECTOMY: SHX55

## 2020-03-19 LAB — COMPREHENSIVE METABOLIC PANEL
ALT: 29 U/L (ref 0–44)
AST: 16 U/L (ref 15–41)
Albumin: 2.1 g/dL — ABNORMAL LOW (ref 3.5–5.0)
Alkaline Phosphatase: 92 U/L (ref 38–126)
Anion gap: 8 (ref 5–15)
BUN: <5 mg/dL — ABNORMAL LOW (ref 8–23)
CO2: 23 mmol/L (ref 22–32)
Calcium: 7.8 mg/dL — ABNORMAL LOW (ref 8.9–10.3)
Chloride: 107 mmol/L (ref 98–111)
Creatinine, Ser: 0.72 mg/dL (ref 0.61–1.24)
GFR, Estimated: >60 mL/min (ref >60)
Glucose, Bld: 105 mg/dL — ABNORMAL HIGH (ref 70–99)
Potassium: 3.2 mmol/L — ABNORMAL LOW (ref 3.5–5.1)
Sodium: 138 mmol/L (ref 135–145)
Total Bilirubin: 1.4 mg/dL — ABNORMAL HIGH (ref 0.3–1.2)
Total Protein: 5.1 g/dL — ABNORMAL LOW (ref 6.5–8.1)

## 2020-03-19 LAB — CBC
HCT: 36.6 % — ABNORMAL LOW (ref 39.0–52.0)
HCT: 40 % (ref 39.0–52.0)
Hemoglobin: 12.9 g/dL — ABNORMAL LOW (ref 13.0–17.0)
Hemoglobin: 13.2 g/dL (ref 13.0–17.0)
MCH: 30 pg (ref 26.0–34.0)
MCH: 28.8 pg (ref 26.0–34.0)
MCHC: 35.2 g/dL (ref 30.0–36.0)
MCHC: 33 g/dL (ref 30.0–36.0)
MCV: 85.1 fL (ref 80.0–100.0)
MCV: 87.1 fL (ref 80.0–100.0)
Platelets: 159 10*3/uL (ref 150–400)
Platelets: 235 10*3/uL (ref 150–400)
RBC: 4.3 MIL/uL (ref 4.22–5.81)
RBC: 4.59 MIL/uL (ref 4.22–5.81)
RDW: 12.7 % (ref 11.5–15.5)
RDW: 12.6 % (ref 11.5–15.5)
WBC: 9.9 10*3/uL (ref 4.0–10.5)
WBC: 23.1 10*3/uL — ABNORMAL HIGH (ref 4.0–10.5)
nRBC: 0 % (ref 0.0–0.2)
nRBC: 0 % (ref 0.0–0.2)

## 2020-03-19 SURGERY — LAPAROSCOPIC CHOLECYSTECTOMY
Anesthesia: General | Site: Abdomen

## 2020-03-19 MED ORDER — ONDANSETRON HCL 4 MG/2ML IJ SOLN: 4.0000 mg | Freq: Once | INTRAMUSCULAR | Status: DC | PRN

## 2020-03-19 MED ORDER — ACETAMINOPHEN 500 MG PO TABS
1000.0000 mg | ORAL_TABLET | Freq: Three times a day (TID) | ORAL | Status: DC
  Administered 2020-03-19 – 2020-03-26 (×15): 1000 mg via ORAL
  Filled 2020-03-19 (×16): qty 2

## 2020-03-19 MED ORDER — ACETAMINOPHEN 10 MG/ML IV SOLN: 1000.0000 mg | Freq: Once | INTRAVENOUS | Status: DC | PRN

## 2020-03-19 MED ORDER — FENTANYL CITRATE (PF) 100 MCG/2ML IJ SOLN
25.0000 ug | INTRAMUSCULAR | Status: DC | PRN
  Administered 2020-03-19 (×4): 25 ug via INTRAVENOUS

## 2020-03-19 MED ORDER — CHLORHEXIDINE GLUCONATE 0.12 % MT SOLN
OROMUCOSAL | Status: AC
  Administered 2020-03-19: 08:00:00 15 mL
  Filled 2020-03-19: qty 15

## 2020-03-19 MED ORDER — BUPIVACAINE HCL (PF) 0.25 % IJ SOLN
INTRAMUSCULAR | Status: AC
  Filled 2020-03-19: qty 30

## 2020-03-19 MED ORDER — OXYCODONE HCL 5 MG PO TABS
5.0000 mg | ORAL_TABLET | ORAL | Status: DC | PRN
  Administered 2020-03-20: 5 mg via ORAL
  Filled 2020-03-19 (×2): qty 1

## 2020-03-19 MED ORDER — KETOROLAC TROMETHAMINE 15 MG/ML IJ SOLN: 15.0000 mg | Freq: Once | INTRAMUSCULAR | Status: DC | PRN

## 2020-03-19 MED ORDER — METHOCARBAMOL 1000 MG/10ML IJ SOLN
500.0000 mg | Freq: Four times a day (QID) | INTRAVENOUS | Status: DC | PRN
  Administered 2020-03-19: 23:00:00 500 mg via INTRAVENOUS
  Filled 2020-03-19 (×3): qty 5

## 2020-03-19 MED ORDER — LIDOCAINE 5 % EX PTCH
1.0000 | MEDICATED_PATCH | CUTANEOUS | Status: DC
  Administered 2020-03-19 – 2020-04-03 (×13): 1 via TRANSDERMAL
  Filled 2020-03-19 (×17): qty 1

## 2020-03-19 MED ORDER — SODIUM CHLORIDE 0.9 % IR SOLN
Status: DC | PRN
  Administered 2020-03-19: 1000 mL

## 2020-03-19 MED ORDER — SUGAMMADEX SODIUM 200 MG/2ML IV SOLN
INTRAVENOUS | Status: DC | PRN
  Administered 2020-03-19: 200 mg via INTRAVENOUS

## 2020-03-19 MED ORDER — HEMOSTATIC AGENTS (NO CHARGE) OPTIME
TOPICAL | Status: DC | PRN
  Administered 2020-03-19 (×6): 1 via TOPICAL

## 2020-03-19 MED ORDER — EPHEDRINE SULFATE-NACL 50-0.9 MG/10ML-% IV SOSY
PREFILLED_SYRINGE | INTRAVENOUS | Status: DC | PRN
  Administered 2020-03-19 (×2): 10 mg via INTRAVENOUS

## 2020-03-19 MED ORDER — DEXAMETHASONE SODIUM PHOSPHATE 10 MG/ML IJ SOLN
INTRAMUSCULAR | Status: DC | PRN
  Administered 2020-03-19: 5 mg via INTRAVENOUS

## 2020-03-19 MED ORDER — POTASSIUM CHLORIDE 10 MEQ/100ML IV SOLN
10.0000 meq | INTRAVENOUS | Status: AC
  Administered 2020-03-19 (×3): 10 meq via INTRAVENOUS
  Filled 2020-03-19 (×3): qty 100

## 2020-03-19 MED ORDER — ALBUMIN HUMAN 5 % IV SOLN: INTRAVENOUS | Status: DC | PRN

## 2020-03-19 MED ORDER — FENTANYL CITRATE (PF) 100 MCG/2ML IJ SOLN
INTRAMUSCULAR | Status: AC
  Filled 2020-03-19: qty 2

## 2020-03-19 MED ORDER — ROCURONIUM BROMIDE 10 MG/ML (PF) SYRINGE
PREFILLED_SYRINGE | INTRAVENOUS | Status: DC | PRN
  Administered 2020-03-19: 40 mg via INTRAVENOUS
  Administered 2020-03-19 (×2): 10 mg via INTRAVENOUS

## 2020-03-19 MED ORDER — MUPIROCIN 2 % EX OINT
1.0000 "application " | TOPICAL_OINTMENT | Freq: Two times a day (BID) | CUTANEOUS | Status: AC
  Administered 2020-03-20 – 2020-03-23 (×7): 1 via NASAL
  Filled 2020-03-19 (×2): qty 22

## 2020-03-19 MED ORDER — HYDROMORPHONE HCL 1 MG/ML IJ SOLN
1.0000 mg | INTRAMUSCULAR | Status: DC | PRN
  Administered 2020-03-19 – 2020-03-20 (×4): 1 mg via INTRAVENOUS
  Filled 2020-03-19 (×4): qty 1

## 2020-03-19 MED ORDER — FENTANYL CITRATE (PF) 250 MCG/5ML IJ SOLN
INTRAMUSCULAR | Status: DC | PRN
  Administered 2020-03-19 (×2): 50 ug via INTRAVENOUS
  Administered 2020-03-19: 100 ug via INTRAVENOUS
  Administered 2020-03-19: 50 ug via INTRAVENOUS

## 2020-03-19 MED ORDER — BUPIVACAINE HCL (PF) 0.25 % IJ SOLN
INTRAMUSCULAR | Status: DC | PRN
  Administered 2020-03-19: 21 mL

## 2020-03-19 MED ORDER — MORPHINE SULFATE (PF) 2 MG/ML IV SOLN
2.0000 mg | INTRAVENOUS | Status: DC | PRN
  Filled 2020-03-19: qty 2

## 2020-03-19 MED ORDER — CEFAZOLIN SODIUM-DEXTROSE 2-3 GM-%(50ML) IV SOLR
INTRAVENOUS | Status: DC | PRN
  Administered 2020-03-19: 2 g via INTRAVENOUS

## 2020-03-19 MED ORDER — STERILE WATER FOR IRRIGATION IR SOLN
Status: DC | PRN
  Administered 2020-03-19: 1000 mL

## 2020-03-19 MED ORDER — LIDOCAINE 2% (20 MG/ML) 5 ML SYRINGE
INTRAMUSCULAR | Status: DC | PRN
  Administered 2020-03-19: 60 mg via INTRAVENOUS

## 2020-03-19 MED ORDER — LACTATED RINGERS IV SOLN: INTRAVENOUS | Status: AC

## 2020-03-19 MED ORDER — PROPOFOL 10 MG/ML IV BOLUS
INTRAVENOUS | Status: AC
  Filled 2020-03-19: qty 20

## 2020-03-19 MED ORDER — DEXTROSE-NACL 5-0.9 % IV SOLN: INTRAVENOUS | Status: DC

## 2020-03-19 MED ORDER — FENTANYL CITRATE (PF) 250 MCG/5ML IJ SOLN
INTRAMUSCULAR | Status: AC
  Filled 2020-03-19: qty 5

## 2020-03-19 MED ORDER — 0.9 % SODIUM CHLORIDE (POUR BTL) OPTIME
TOPICAL | Status: DC | PRN
  Administered 2020-03-19: 10:00:00 1000 mL

## 2020-03-19 MED ORDER — ONDANSETRON HCL 4 MG/2ML IJ SOLN
INTRAMUSCULAR | Status: DC | PRN
  Administered 2020-03-19: 4 mg via INTRAVENOUS

## 2020-03-19 MED ORDER — PROPOFOL 10 MG/ML IV BOLUS
INTRAVENOUS | Status: DC | PRN
  Administered 2020-03-19: 200 mg via INTRAVENOUS
  Administered 2020-03-19: 30 mg via INTRAVENOUS

## 2020-03-19 SURGICAL SUPPLY — 47 items
ADH SKN CLS APL DERMABOND .7 (GAUZE/BANDAGES/DRESSINGS) ×1
AGENT HMST 10 BLLW SHRT CANN (HEMOSTASIS) ×1
APL LAPSCP (MISCELLANEOUS) ×1
APL PRP STRL LF DISP 70% ISPRP (MISCELLANEOUS) ×1
APPLICATOR LAPAROSCOPIC HEMOBL (MISCELLANEOUS) ×1 IMPLANT
APPLIER CLIP 5 13 M/L LIGAMAX5 (MISCELLANEOUS) ×2
APR CLP MED LRG 5 ANG JAW (MISCELLANEOUS) ×1
BAG SPEC RTRVL 10 TROC 200 (ENDOMECHANICALS) ×1
BLADE CLIPPER SURG (BLADE) ×1 IMPLANT
CANISTER SUCT 3000ML PPV (MISCELLANEOUS) ×2 IMPLANT
CHLORAPREP W/TINT 26 (MISCELLANEOUS) ×2 IMPLANT
CLIP APPLIE 5 13 M/L LIGAMAX5 (MISCELLANEOUS) ×1 IMPLANT
COVER SURGICAL LIGHT HANDLE (MISCELLANEOUS) ×2 IMPLANT
DERMABOND ADVANCED (GAUZE/BANDAGES/DRESSINGS) ×1
DERMABOND ADVANCED .7 DNX12 (GAUZE/BANDAGES/DRESSINGS) ×1 IMPLANT
ELECT REM PT RETURN 9FT ADLT (ELECTROSURGICAL) ×2
ELECTRODE REM PT RTRN 9FT ADLT (ELECTROSURGICAL) ×1 IMPLANT
GLOVE BIO SURGEON STRL SZ8 (GLOVE) ×2 IMPLANT
GLOVE BIOGEL PI IND STRL 8 (GLOVE) ×1 IMPLANT
GLOVE BIOGEL PI INDICATOR 8 (GLOVE) ×1
GOWN STRL REUS W/ TWL LRG LVL3 (GOWN DISPOSABLE) ×2 IMPLANT
GOWN STRL REUS W/ TWL XL LVL3 (GOWN DISPOSABLE) ×1 IMPLANT
GOWN STRL REUS W/TWL LRG LVL3 (GOWN DISPOSABLE) ×4
GOWN STRL REUS W/TWL XL LVL3 (GOWN DISPOSABLE) ×2
HEMOSTAT HEMOBLAST BELLOWS (HEMOSTASIS) ×1 IMPLANT
HEMOSTAT SNOW SURGICEL 2X4 (HEMOSTASIS) ×5 IMPLANT
KIT BASIN OR (CUSTOM PROCEDURE TRAY) ×2 IMPLANT
KIT TURNOVER KIT B (KITS) ×2 IMPLANT
L-HOOK LAP DISP 36CM (ELECTROSURGICAL) ×2
LHOOK LAP DISP 36CM (ELECTROSURGICAL) ×1 IMPLANT
NEEDLE 22X1 1/2 (OR ONLY) (NEEDLE) ×2 IMPLANT
NS IRRIG 1000ML POUR BTL (IV SOLUTION) ×2 IMPLANT
PAD ARMBOARD 7.5X6 YLW CONV (MISCELLANEOUS) ×2 IMPLANT
PENCIL BUTTON HOLSTER BLD 10FT (ELECTRODE) ×2 IMPLANT
POUCH RETRIEVAL ECOSAC 10 (ENDOMECHANICALS) ×1 IMPLANT
POUCH RETRIEVAL ECOSAC 10MM (ENDOMECHANICALS) ×2
SCISSORS LAP 5X35 DISP (ENDOMECHANICALS) ×2 IMPLANT
SET IRRIG TUBING LAPAROSCOPIC (IRRIGATION / IRRIGATOR) ×2 IMPLANT
SET TUBE SMOKE EVAC HIGH FLOW (TUBING) ×2 IMPLANT
SLEEVE ENDOPATH XCEL 5M (ENDOMECHANICALS) ×4 IMPLANT
SUT VIC AB 4-0 PS2 27 (SUTURE) ×2 IMPLANT
TOWEL GREEN STERILE (TOWEL DISPOSABLE) ×2 IMPLANT
TOWEL GREEN STERILE FF (TOWEL DISPOSABLE) ×2 IMPLANT
TRAY LAPAROSCOPIC MC (CUSTOM PROCEDURE TRAY) ×2 IMPLANT
TROCAR XCEL BLUNT TIP 100MML (ENDOMECHANICALS) ×2 IMPLANT
TROCAR XCEL NON-BLD 5MMX100MML (ENDOMECHANICALS) ×2 IMPLANT
WATER STERILE IRR 1000ML POUR (IV SOLUTION) ×2 IMPLANT

## 2020-03-19 NOTE — Progress Notes (Signed)
Progress Note    Jorge Pi.  GEX:[id number] DOB: 07/26/1950  DOA: Mar 25, 2020 PCP: Salley Scarlet, MD    Brief Narrative:     Medical records reviewed and are as summarized below:  Jorge Pi. is an 69 y.o. male with past medical history significant for GERD who presented with progressive abdominal pain.  Noted to have elevated transaminitis and total bilirubin.  CT abdomen/pelvis with IV contrast with intra-/extrahepatic biliary duct dilation with filling defects within the common duct concerning for choledocholithiasis.  Underwent ERCP on 03/24/2020, 3 large common bile duct stones noted, unable to perform a sphincterotomy and stone extraction due to anatomy s/p Billroth II; and had stent placement. 12/16: cholecystectomy 12/17: ERCP  Assessment/Plan:   Principal Problem:   Choledocholithiasis Active Problems:   GERD (gastroesophageal reflux disease)   Iron deficiency anemia   Elevated LFTs   Choledocholithiasis --Gastroenterology, Dr. Louie Casa  --Zosyn discontinued by GI on 12/12 --repeat ERCP on 12/17 by Dr. Meridee Score --Gen Surgery recommends repeat ERCP and transfer to tertiary center vs surgical management of choledocholithiasis: per Dr. [place]: Received return call from [facility], GI service with Dr. Hildred Laser and Dr. Jamse Mead; no indication for transfer at this time given improving LFTs with stent adequately placed. They recommended surgical consultation if concern for cholecystitis, which General Surgery is currently consulted for here at [facility]. [facility] GI service would be happy to see the patient for outpatient consultation in the few weeks if needed. --Pain control and antiemetics --Continue to follow LFTs and CBC daily   hypokalemia -replete  GERD --Carafate 1 g 4 times daily --Protonix 40 mg p.o. daily  Iron deficiency anemia -ferrous sulfate 325 mg daily at home.  Hiccups -baclofen trial on 12/15 -re-assess  after surgery   Family Communication/Anticipated D/C date and plan/Code Status   DVT prophylaxis: Lovenox ordered. Code Status: Full Code.  Disposition Plan: Status is: Inpatient  Remains inpatient appropriate because:Inpatient level of care appropriate due to severity of illness   Dispo:  Patient From: Home  Planned Disposition: Home  Expected discharge date: 03/21/2020  Medically stable for discharge: No          Medical Consultants:    GI  GS  Subjective:   Is on his way to the OR  Objective:    Vitals:   03/18/20 1212 03/18/20 1824 03/18/20 2333 04/02/2020 0535  BP: 125/62 (!) 151/66 (!) 154/74 (!) 156/66  Pulse: (!) 55 61 69 62  Resp: 16 18 18 18   Temp: 98.6 F (37 C) 98 F (36.7 C) 99.4 F (37.4 C) 99 F (37.2 C)  TempSrc: Oral Oral Oral Oral  SpO2: 95% 97% 98% 97%  Weight:      Height:        Intake/Output Summary (Last 24 hours) at 03/05/2020 0817 Last data filed at 04/01/2020 0536 Gross per 24 hour  Intake 2601.67 ml  Output 2300 ml  Net 301.67 ml   Filed Weights   03-25-20 1442  Weight: 69.9 kg    Exam:   General: Appearance:    Well developed, well nourished male in no acute distress     Lungs:     Clear to auscultation bilaterally, respirations unlabored  Heart:    Normal heart rate. Normal rhythm. No murmurs, rubs, or gallops.      Neurologic:   Awake, alert, oriented x 3. No apparent focal neurological           defect.  Data Reviewed:   I have personally reviewed following labs and imaging studies:  Labs: Labs show the following:   Basic Metabolic Panel: Recent Labs  Lab 03/14/20 0209 03/15/20 0245 03/16/20 0303 03/17/20 0323 03/18/20 0330 03/29/2020 0343  NA 137 134* 133* 134* 137 138  K 4.6 3.9 3.8 3.8 3.2* 3.2*  CL 106 103 102 105 107 107  CO2 21* 19* 18* 17* 23 23  GLUCOSE 146* 73 77 59* 118* 105*  BUN 14 10 11 14  7* <5*  CREATININE 1.07 0.92 0.83 0.87 0.80 0.72  CALCIUM 8.5* 8.4* 8.5* 8.1* 7.9*  7.8*  MG 2.1  --   --   --   --   --    GFR Estimated Creatinine Clearance: 77.5 mL/min (by C-G formula based on SCr of 0.72 mg/dL). Liver Function Tests: Recent Labs  Lab 03/15/20 0245 03/16/20 0303 03/17/20 0323 03/18/20 0330 03/17/2020 0343  AST 46* 33 22 18 16   ALT 100* 80* 52* 38 29  ALKPHOS 123 141* 121 102 92  BILITOT 4.1* 3.1* 2.6* 1.7* 1.4*  PROT 5.9* 6.0* 5.8* 5.5* 5.1*  ALBUMIN 3.1* 2.9* 2.5* 2.2* 2.1*   Recent Labs  Lab 03/11/2020 1452  LIPASE 20   No results for input(s): AMMONIA in the last 168 hours. Coagulation profile No results for input(s): INR, PROTIME in the last 168 hours.  CBC: Recent Labs  Lab 03/15/20 0245 03/16/20 0303 03/17/20 0323 03/18/20 0330 03/22/2020 0343  WBC 11.2* 13.9* 13.9* 12.4* 9.9  HGB 14.5 15.5 14.4 13.4 12.9*  HCT 43.4 43.5 43.0 39.9 36.6*  MCV 87.0 84.3 86.3 86.0 85.1  PLT 144* 157 145* 156 159   Cardiac Enzymes: No results for input(s): CKTOTAL, CKMB, CKMBINDEX, TROPONINI in the last 168 hours. BNP (last 3 results) No results for input(s): PROBNP in the last 8760 hours. CBG: No results for input(s): GLUCAP in the last 168 hours. D-Dimer: No results for input(s): DDIMER in the last 72 hours. Hgb A1c: No results for input(s): HGBA1C in the last 72 hours. Lipid Profile: No results for input(s): CHOL, HDL, LDLCALC, TRIG, CHOLHDL, LDLDIRECT in the last 72 hours. Thyroid function studies: No results for input(s): TSH, T4TOTAL, T3FREE, THYROIDAB in the last 72 hours.  Invalid input(s): FREET3 Anemia work up: No results for input(s): VITAMINB12, FOLATE, FERRITIN, TIBC, IRON, RETICCTPCT in the last 72 hours. Sepsis Labs: Recent Labs  Lab 03/16/20 0303 03/17/20 0323 03/18/20 0330 03/10/2020 0343  WBC 13.9* 13.9* 12.4* 9.9    Microbiology Recent Results (from the past 240 hour(s))  Resp Panel by RT-PCR (Flu A&B, Covid) Nasopharyngeal Swab     Status: None   Collection Time: 04/03/2020 12:30 AM   Specimen: Nasopharyngeal  Swab; Nasopharyngeal(NP) swabs in vial transport medium  Result Value Ref Range Status   SARS Coronavirus 2 by RT PCR NEGATIVE NEGATIVE Final    Comment: (NOTE) SARS-CoV-2 target nucleic acids are NOT DETECTED.  The SARS-CoV-2 RNA is generally detectable in upper respiratory specimens during the acute phase of infection. The lowest concentration of SARS-CoV-2 viral copies this assay can detect is 138 copies/mL. A negative result does not preclude SARS-Cov-2 infection and should not be used as the sole basis for treatment or other patient management decisions. A negative result may occur with  improper specimen collection/handling, submission of specimen other than nasopharyngeal swab, presence of viral mutation(s) within the areas targeted by this assay, and inadequate number of viral copies(<138 copies/mL). A negative result must be combined with clinical observations,  patient history, and epidemiological information. The expected result is Negative.  Fact Sheet for Patients:  BloggerCourse.com  Fact Sheet for Healthcare Providers:  SeriousBroker.it  This test is no t yet approved or cleared by the [place] FDA and  has been authorized for detection and/or diagnosis of SARS-CoV-2 by FDA under an Emergency Use Authorization (EUA). This EUA will remain  in effect (meaning this test can be used) for the duration of the COVID-19 declaration under Section 564(b)(1) of the Act, 21 U.S.C.section 360bbb-3(b)(1), unless the authorization is terminated  or revoked sooner.       Influenza A by PCR NEGATIVE NEGATIVE Final   Influenza B by PCR NEGATIVE NEGATIVE Final    Comment: (NOTE) The Xpert Xpress SARS-CoV-2/FLU/RSV plus assay is intended as an aid in the diagnosis of influenza from Nasopharyngeal swab specimens and should not be used as a sole basis for treatment. Nasal washings and aspirates are unacceptable for Xpert Xpress  SARS-CoV-2/FLU/RSV testing.  Fact Sheet for Patients: BloggerCourse.com  Fact Sheet for Healthcare Providers: SeriousBroker.it  This test is not yet approved or cleared by the [place] FDA and has been authorized for detection and/or diagnosis of SARS-CoV-2 by FDA under an Emergency Use Authorization (EUA). This EUA will remain in effect (meaning this test can be used) for the duration of the COVID-19 declaration under Section 564(b)(1) of the Act, 21 U.S.C. section 360bbb-3(b)(1), unless the authorization is terminated or revoked.  Performed at [facility], [address], [place], [place] [address]     Procedures and diagnostic studies:  No results found.  Medications:   . [MAR Hold] mupirocin ointment  1 application Nasal BID  . [MAR Hold] pantoprazole  40 mg Oral Daily  . [MAR Hold] sucralfate  1 g Oral TID WC & HS   Continuous Infusions: . [MAR Hold]  ceFAZolin (ANCEF) IV    . dextrose 5 % and 0.9% NaCl 75 mL/hr at Apr 13, 2020 8756     LOS: 6 days   Joseph Art  [facility]   How to contact the [facility] Attending or Consulting provider 7A - 7P or covering provider during after hours 7P -7A, for this patient?  1. Check the care team in [facility] and look for a) attending/consulting TRH provider listed and b) the [facility] team listed 2. Log into [email or web address] and use 's universal password to access. If you do not have the password, please contact the hospital operator. 3. Locate the [facility] provider you are looking for under [facility] and page to a number that you can be directly reached. 4. If you still have difficulty reaching the provider, please page the [facility] (Director on Call) for the Hospitalists listed on amion for assistance.  13-Apr-2020, 8:17 AM

## 2020-03-19 NOTE — Anesthesia Postprocedure Evaluation (Signed)
Anesthesia Post Note  Patient: Jorge Evans.  Procedure(s) Performed: LAPAROSCOPIC CHOLECYSTECTOMY (N/A Abdomen)     Patient location during evaluation: PACU Anesthesia Type: General Level of consciousness: awake Pain management: pain level controlled Vital Signs Assessment: post-procedure vital signs reviewed and stable Respiratory status: spontaneous breathing, nonlabored ventilation, respiratory function stable and patient connected to nasal cannula oxygen Cardiovascular status: blood pressure returned to baseline and stable Postop Assessment: no apparent nausea or vomiting Anesthetic complications: no   No complications documented.  Last Vitals:  Vitals:   03/18/2020 1238 03/29/2020 1250  BP: 134/77 (!) 148/71  Pulse: 96 (!) 102  Resp: 17 (!) 22  Temp:  37.2 C  SpO2: 95% 97%    Last Pain:  Vitals:   03/18/2020 1051  TempSrc:   PainSc: Asleep                 Birda Didonato P Chasidy Janak

## 2020-03-19 NOTE — Progress Notes (Signed)
Patient back to room from OR in NAD, VS stable.

## 2020-03-19 NOTE — Progress Notes (Signed)
Report called to short stay, patient off floor to OR.

## 2020-03-19 NOTE — Anesthesia Procedure Notes (Addendum)
Procedure Name: Intubation Date/Time: 03/05/2020 9:16 AM Performed by: Dorthea Cove, CRNA Pre-anesthesia Checklist: Patient identified, Emergency Drugs available, Suction available and Patient being monitored Patient Re-evaluated:Patient Re-evaluated prior to induction Oxygen Delivery Method: Circle system utilized Preoxygenation: Pre-oxygenation with 100% oxygen Induction Type: IV induction Ventilation: Mask ventilation without difficulty and Oral airway inserted - appropriate to patient size Laryngoscope Size: Mac and 4 Grade View: Grade I Tube type: Oral Tube size: 7.5 mm Number of attempts: 1 Airway Equipment and Method: Stylet and Oral airway Placement Confirmation: ETT inserted through vocal cords under direct vision,  positive ETCO2 and breath sounds checked- equal and bilateral Secured at: 22 cm Tube secured with: Tape Dental Injury: Teeth and Oropharynx as per pre-operative assessment

## 2020-03-19 NOTE — Progress Notes (Signed)
6 Days Post-Op   Subjective/Chief Complaint: Felt well overnight, no nausea   Objective: Vital signs in last 24 hours: Temp:  [98 F (36.7 C)-99.4 F (37.4 C)] 99 F (37.2 C) (12/16 0535) Pulse Rate:  [55-69] 62 (12/16 0535) Resp:  [16-18] 18 (12/16 0535) BP: (125-156)/(62-74) 156/66 (12/16 0535) SpO2:  [95 %-98 %] 97 % (12/16 0535) Last BM Date: 03/18/20  Intake/Output from previous day: 12/15 0701 - 12/16 0700 In: 2601.7 [I.V.:2321.7; IV Piggyback:280] Out: 2300 [Urine:2300] Intake/Output this shift: No intake/output data recorded.  General appearance: alert and cooperative Resp: clear to auscultation bilaterally Cardio: regular rate and rhythm GI: soft, no RUQ tenderness  Lab Results:  Recent Labs    03/18/20 0330 03/26/2020 0343  WBC 12.4* 9.9  HGB 13.4 12.9*  HCT 39.9 36.6*  PLT 156 159   BMET Recent Labs    03/18/20 0330 04/02/2020 0343  NA 137 138  K 3.2* 3.2*  CL 107 107  CO2 23 23  GLUCOSE 118* 105*  BUN 7* <5*  CREATININE 0.80 0.72  CALCIUM 7.9* 7.8*   PT/INR No results for input(s): LABPROT, INR in the last 72 hours. ABG No results for input(s): PHART, HCO3 in the last 72 hours.  Invalid input(s): PCO2, PO2  Studies/Results: No results found.  Anti-infectives: Anti-infectives (From admission, onward)   Start     Dose/Rate Route Frequency Ordered Stop   03/06/2020 0600  ceFAZolin (ANCEF) IVPB 2g/100 mL premix        2 g 200 mL/hr over 30 Minutes Intravenous On call to O.R. 03/18/20 1334 03/20/20 0559   03/14/2020 0600  piperacillin-tazobactam (ZOSYN) IVPB 3.375 g  Status:  Discontinued        3.375 g 12.5 mL/hr over 240 Minutes Intravenous Every 8 hours 03/26/2020 0046 03/15/20 1109   03/26/2020 2330  piperacillin-tazobactam (ZOSYN) IVPB 3.375 g        3.375 g 100 mL/hr over 30 Minutes Intravenous  Once 03/29/2020 2317 03/17/2020 0131      Assessment/Plan: PMH Bilroth II for bleeding gastric ulcers   Symptomatic  cholelithiasis Choledocholithiasis without evidence of acute cholecystitis  S/p ERCP, placement of CBD stent 12/10 [name] - for laparoscopic cholecystectomy today. I discussed the procedure, risks, and benefits with him and his wife. I discussed that we may need additional ports or to open in light of previous surgery. He agrees - ERCP tomorrow by GI team  FEN: NPO for OR VTE: SCDs ID: Ancef on call to OR  LOS: 6 days    Jorge Evans 03/14/2020

## 2020-03-19 NOTE — Progress Notes (Signed)
Patient ID: Jorge Evans., male   DOB: Jul 01, 1950, 69 y.o.   MRN: [id number] Had more pain than expected this PM. He just got some pain meds and is resting now. No peritonitis. Hb stable but is being repeated. I spoke with his wife.  Violeta Gelinas, MD, MPH, FACS Please use [email or web address] to contact on call provider

## 2020-03-19 NOTE — Op Note (Signed)
  03/31/2020 - 03/24/2020  10:42 AM  PATIENT:  Jorge Evans.  69 y.o. male  PRE-OPERATIVE DIAGNOSIS:  CHOLEDOCHOLITHIASIS  POST-OPERATIVE DIAGNOSIS:  CHOLEDOCHOLITHIASIS  PROCEDURE:  Procedure(s): LAPAROSCOPIC CHOLECYSTECTOMY  SURGEON:  Violeta Gelinas, MD  ASSISTANTS: Harden Mo, MD; Carlena Bjornstad, PA-C  ANESTHESIA:   local and general  EBL:  Total I/O In: 1000 [I.V.:1000] Out: 600 [Blood:600]  BLOOD ADMINISTERED:none  DRAINS: none   SPECIMEN:  Excision  DISPOSITION OF SPECIMEN:  PATHOLOGY  COUNTS:  YES  DICTATION: .Dragon Dictation Procedure in detail: Informed consent was obtained.  He received intravenous antibiotics.  He was brought to the operating room and general endotracheal anesthesia was administered by the anesthesia staff.  His abdomen was prepped and draped in sterile fashion.  We did a timeout procedure.The supraumbilical region was infiltrated with local. Supraumbilical incision was made. Subcutaneous tissues were dissected down revealing the anterior fascia. This was divided sharply along the midline.  There seemed to be some adhesions and I was not able to easily gain entry there so I chose to do a right upper quadrant Optiview.  This was done in standard fashion along the costal margin.  The abdomen insufflated easily and inspection with the scope afterwards revealed no injuries from the insertion.  I was then able to easily place the [facility] trocar through the previous incision above the umbilicus.  Under direct vision a epigastric port and an additional right-sided abdominal port were placed.  These were both 5 mm ports and we used local at the sites.  Laparoscopic exploration revealed a gallbladder with a lot of omental adhesions.  These were gradually dissected away.  The dome was retracted superior medially and the infundibulum, once revealed, was retracted inferior laterally.  Dissection began laterally and progressed medially identifying the cystic  duct.  A large window and critical view were created.  The cystic duct was then clipped 3 times proximally, once distally and divided.  Further dissection revealed an anterior and posterior branch of the cystic artery.  These were both clipped twice proximally and divided distally with cautery.  The gallbladder was taken off the liver bed using Bovie cautery.  While doing this, we did encounter a superficial venous lake in the liver.  After initial cautery did not control the bleeding from this, I used a spatula cautery which achieved some good hemostasis.  I then placed some hemoblast in the area followed by Surgicel snow x2.  There was excellent hemostasis.  The remainder the gallbladder was taken off the liver bed.  It was placed in a bag and removed from the abdomen.  It was sent to pathology.  The area was irrigated.  The liver bed appeared dry on close inspection.  Pneumoperitoneum was released.  Ports were removed under direct vision.  Each of the wounds was irrigated and then closed with 4-0 Vicryl followed by Dermabond.  All counts were correct.  He tolerated the procedure well and was taken recovery in stable condition.  PATIENT DISPOSITION:  PACU - hemodynamically stable.   Delay start of Pharmacological VTE agent (>24hrs) due to surgical blood loss or risk of bleeding:  yes  Violeta Gelinas, MD, MPH, FACS Pager: [phone]  12/16/202110:42 AM

## 2020-03-19 NOTE — Anesthesia Preprocedure Evaluation (Addendum)
Anesthesia Evaluation  Patient identified by MRN, date of birth, ID band Patient awake    Reviewed: Allergy & Precautions, NPO status , Patient's Chart, lab work & pertinent test results  Airway Mallampati: III  TM Distance: >3 FB Neck ROM: Full    Dental  (+) Edentulous Upper, Edentulous Lower   Pulmonary former smoker,    Pulmonary exam normal breath sounds clear to auscultation       Cardiovascular negative cardio ROS Normal cardiovascular exam Rhythm:Regular Rate:Normal  ECG: rate 93. Sinus rhythm Short PR interval RBBB and LAFB   Neuro/Psych PSYCHIATRIC DISORDERS Depression Vertigo    GI/Hepatic negative GI ROS, Neg liver ROS,   Endo/Other  negative endocrine ROS  Renal/GU negative Renal ROS     Musculoskeletal negative musculoskeletal ROS (+)   Abdominal   Peds  Hematology  (+) anemia ,   Anesthesia Other Findings CHOLEDOCHOLITHIASIS  Reproductive/Obstetrics                            Anesthesia Physical Anesthesia Plan  ASA: II  Anesthesia Plan: General   Post-op Pain Management:    Induction: Intravenous  PONV Risk Score and Plan: 2 and Ondansetron, Dexamethasone and Treatment may vary due to age or medical condition  Airway Management Planned: Oral ETT  Additional Equipment:   Intra-op Plan:   Post-operative Plan: Extubation in OR  Informed Consent: I have reviewed the patients History and Physical, chart, labs and discussed the procedure including the risks, benefits and alternatives for the proposed anesthesia with the patient or authorized representative who has indicated his/her understanding and acceptance.       Plan Discussed with: CRNA  Anesthesia Plan Comments:        Anesthesia Quick Evaluation

## 2020-03-19 NOTE — Transfer of Care (Signed)
Immediate Anesthesia Transfer of Care Note  Patient: Jorge Evans.  Procedure(s) Performed: LAPAROSCOPIC CHOLECYSTECTOMY (N/A Abdomen)  Patient Location: PACU  Anesthesia Type:General  Level of Consciousness: awake, alert  and patient cooperative  Airway & Oxygen Therapy: Patient Spontanous Breathing  Post-op Assessment: Report given to RN and Post -op Vital signs reviewed and stable  Post vital signs: Reviewed and stable  Last Vitals:  Vitals Value Taken Time  BP 159/75 04/18/20 1051  Temp 36.5 C April 18, 2020 1051  Pulse 85 April 18, 2020 1051  Resp 18 April 18, 2020 1051  SpO2 97 % 04-18-2020 1051  Vitals shown include unvalidated device data.  Last Pain:  Vitals:   04/18/2020 1051  TempSrc:   PainSc: Asleep         Complications: No complications documented.

## 2020-03-20 ENCOUNTER — Inpatient Hospital Stay (HOSPITAL_COMMUNITY): Payer: Medicare Other

## 2020-03-20 ENCOUNTER — Encounter (HOSPITAL_COMMUNITY): Payer: Self-pay | Admitting: General Surgery

## 2020-03-20 LAB — COMPREHENSIVE METABOLIC PANEL
ALT: 249 U/L — ABNORMAL HIGH (ref 0–44)
AST: 274 U/L — ABNORMAL HIGH (ref 15–41)
Albumin: 2.4 g/dL — ABNORMAL LOW (ref 3.5–5.0)
Alkaline Phosphatase: 100 U/L (ref 38–126)
Anion gap: 9 (ref 5–15)
BUN: 8 mg/dL (ref 8–23)
CO2: 23 mmol/L (ref 22–32)
Calcium: 7.9 mg/dL — ABNORMAL LOW (ref 8.9–10.3)
Chloride: 104 mmol/L (ref 98–111)
Creatinine, Ser: 0.96 mg/dL (ref 0.61–1.24)
GFR, Estimated: >60 mL/min (ref >60)
Glucose, Bld: 163 mg/dL — ABNORMAL HIGH (ref 70–99)
Potassium: 4.3 mmol/L (ref 3.5–5.1)
Sodium: 136 mmol/L (ref 135–145)
Total Bilirubin: 1.8 mg/dL — ABNORMAL HIGH (ref 0.3–1.2)
Total Protein: 5.9 g/dL — ABNORMAL LOW (ref 6.5–8.1)

## 2020-03-20 LAB — CBC
HCT: 37.5 % — ABNORMAL LOW (ref 39.0–52.0)
Hemoglobin: 13.1 g/dL (ref 13.0–17.0)
MCH: 30.1 pg (ref 26.0–34.0)
MCHC: 34.9 g/dL (ref 30.0–36.0)
MCV: 86.2 fL (ref 80.0–100.0)
Platelets: 259 10*3/uL (ref 150–400)
RBC: 4.35 MIL/uL (ref 4.22–5.81)
RDW: 12.9 % (ref 11.5–15.5)
WBC: 19.1 10*3/uL — ABNORMAL HIGH (ref 4.0–10.5)
nRBC: 0 % (ref 0.0–0.2)

## 2020-03-20 LAB — SURGICAL PATHOLOGY

## 2020-03-20 MED ORDER — LIDOCAINE HCL URETHRAL/MUCOSAL 2 % EX GEL
1.0000 "application " | Freq: Once | CUTANEOUS | Status: DC
  Filled 2020-03-20: qty 11

## 2020-03-20 MED ORDER — KETOROLAC TROMETHAMINE 15 MG/ML IJ SOLN
15.0000 mg | Freq: Once | INTRAMUSCULAR | Status: AC
  Administered 2020-03-20: 06:00:00 15 mg via INTRAVENOUS
  Filled 2020-03-20: qty 1

## 2020-03-20 MED ORDER — HYDROMORPHONE HCL 1 MG/ML IJ SOLN
1.0000 mg | INTRAMUSCULAR | Status: DC | PRN
  Administered 2020-03-20: 2 mg via INTRAVENOUS
  Filled 2020-03-20: qty 2

## 2020-03-20 MED ORDER — HYDROMORPHONE HCL 1 MG/ML IJ SOLN
1.0000 mg | INTRAMUSCULAR | Status: DC | PRN
  Administered 2020-03-20: 1 mg via INTRAVENOUS
  Filled 2020-03-20: qty 1

## 2020-03-20 MED ORDER — HYDROMORPHONE HCL 1 MG/ML IJ SOLN
1.0000 mg | Freq: Once | INTRAMUSCULAR | Status: AC | PRN
  Administered 2020-03-20: 1 mg via INTRAVENOUS
  Filled 2020-03-20: qty 1

## 2020-03-20 MED ORDER — HYDROMORPHONE HCL 1 MG/ML IJ SOLN
1.0000 mg | INTRAMUSCULAR | Status: DC | PRN
  Administered 2020-03-20: 2 mg via INTRAVENOUS
  Administered 2020-03-21: 1 mg via INTRAVENOUS
  Filled 2020-03-20 (×2): qty 2

## 2020-03-20 MED ORDER — OXYCODONE HCL 5 MG PO TABS
5.0000 mg | ORAL_TABLET | ORAL | Status: DC | PRN
  Administered 2020-03-22 (×2): 10 mg via ORAL
  Filled 2020-03-20 (×2): qty 2

## 2020-03-20 MED ORDER — IOHEXOL 300 MG/ML  SOLN
100.0000 mL | Freq: Once | INTRAMUSCULAR | Status: AC | PRN
  Administered 2020-03-20: 23:00:00 100 mL via INTRAVENOUS

## 2020-03-20 MED ORDER — KETOROLAC TROMETHAMINE 15 MG/ML IJ SOLN
15.0000 mg | Freq: Once | INTRAMUSCULAR | Status: DC
  Filled 2020-03-20: qty 1

## 2020-03-20 NOTE — Progress Notes (Signed)
Patient ID: Jorge Pi., male   DOB: Mar 26, 1951, 69 y.o.   MRN: [id number] C/O a lot of umbilical pain this afternoon. VSS, Abdomen is soft and I do not feel a hernia. Since he has been having a lot of pain, will check CT A/P to R/O any complications. I spoke with his wife.  Jorge Gelinas, MD, MPH, FACS Please use [email or web address] to contact on call provider

## 2020-03-20 NOTE — Progress Notes (Signed)
Patient has been in pain on and off the entire shift. Got one dose of dilaudid which alleviated the pain slightly. Bladder scan him since he has not voided . 310 ml of urine noted. Assisted patient to stand to void, but he voided 20 ml of dark urine. On call provider text paged.

## 2020-03-20 NOTE — Progress Notes (Signed)
Unable to do In  and Out cath, kept meeting resistance.  Charged nurse also tried but she was also not successful. MD paged.

## 2020-03-20 NOTE — Progress Notes (Signed)
Call back received from Dr Antionette Char, he asked me to bladder scan the patient in awhile.Per MD he will pass it on to attending this AM

## 2020-03-20 NOTE — Progress Notes (Signed)
Subjective: Complaining about right sided periumbilical and RLQ pain.  Objective: Vital signs in last 24 hours: Temp:  [98.2 F (36.8 C)-98.9 F (37.2 C)] 98.2 F (36.8 C) (12/17 1202) Pulse Rate:  [73-100] 73 (12/17 1202) Resp:  [18-20] 18 (12/17 1202) BP: (103-148)/(52-87) 112/52 (12/17 1202) SpO2:  [93 %-100 %] 100 % (12/17 1202) Last BM Date: 03/18/20  Intake/Output from previous day: 12/16 0701 - 12/17 0700 In: 1687.9 [I.V.:1687.9] Out: 600 [Blood:600] Intake/Output this shift: Total I/O In: -  Out: 320 [Urine:320]  General appearance: alert and moderate distress GI: tender in the right periumbilical/RLQ region  Lab Results: Recent Labs    03/30/2020 0343 04/01/2020 1543 03/20/20 0406  WBC 9.9 23.1* 19.1*  HGB 12.9* 13.2 13.1  HCT 36.6* 40.0 37.5*  PLT 159 235 259   BMET Recent Labs    03/18/20 0330 03/14/2020 0343 03/20/20 0406  NA 137 138 136  K 3.2* 3.2* 4.3  CL 107 107 104  CO2 23 23 23   GLUCOSE 118* 105* 163*  BUN 7* <5* 8  CREATININE 0.80 0.72 0.96  CALCIUM 7.9* 7.8* 7.9*   LFT Recent Labs    03/20/20 0406  PROT 5.9*  ALBUMIN 2.4*  AST 274*  ALT 249*  ALKPHOS 100  BILITOT 1.8*   PT/INR No results for input(s): LABPROT, INR in the last 72 hours. Hepatitis Panel No results for input(s): HEPBSAG, HCVAB, HEPAIGM, HEPBIGM in the last 72 hours. C-Diff No results for input(s): CDIFFTOX in the last 72 hours. Fecal Lactopherrin No results for input(s): FECLLACTOFRN in the last 72 hours.  Studies/Results: No results found.  Medications:  Scheduled: . acetaminophen  1,000 mg Oral Q8H  . lidocaine  1 patch Transdermal Q24H  . lidocaine  1 application Urethral Once  . mupirocin ointment  1 application Nasal BID  . pantoprazole  40 mg Oral Daily  . sucralfate  1 g Oral TID WC & HS   Continuous: . dextrose 5 % and 0.9% NaCl 75 mL/hr at 03/20/20 0158  . lactated ringers 10 mL/hr at 04/01/2020 0829  . methocarbamol (ROBAXIN) IV 500 mg  (03/20/2020 2242)    Assessment/Plan: 1) Right periumbilical/RLQ pain. 2) S/p lap chole. 3) Choledocholithiasis. 4) Elevation in the liver enzymes.   The source of the pain is not known, but it does not appear to be his CBD stones.  He was not complaining about this type of pain prior to or after the ERCP with stent placement.  His hiccups and nausea/vomiting resolved s/p lap chole.  The elevation of his liver enzymes is as a result of the surgery.  All of his numbers normalized with the stent placement.  There is an issue with urinary retention, but after voiding he did not report any improvement in his pain.  Pain control does help.  Plan: 1) Continue with pain medications. 2) Monitor liver panel and CBC. 3) At this time Dr. 03/21/20 cannot perform the ERCP in a timely manner as there is no anesthesia support after 3 PM on Monday.  If he should deteriorate over the weekend, he will need to be transferred to a tertiary care center.  LOS: 7 days   Kerrington Sova D 03/20/2020, 12:51 PM

## 2020-03-20 NOTE — Discharge Instructions (Signed)
Endoscopic Retrograde Cholangiopancreatogram, Care After This sheet gives you information about how to care for yourself after your procedure. Your health care provider may also give you more specific instructions. If you have problems or questions, contact your health care provider. What can I expect after the procedure? After the procedure, it is common to have:  Soreness in your throat.  Nausea.  Bloating.  Dizziness.  Tiredness (fatigue). Follow these instructions at home:   Take over-the-counter and prescription medicines only as told by your health care provider.  Do not drive for 24 hours if you were given a medicine to help you relax (sedative) during your procedure. Have someone stay with you for 24 hours after the procedure.  Return to your normal activities as told by your health care provider. Ask your health care provider what activities are safe for you.  Return to eating what you normally do as soon as you feel well enough or as told by your health care provider.  Keep all follow-up visits as told by your health care provider. This is important. Contact a health care provider if:  You have pain in your abdomen that does not get better with medicine.  You develop signs of infection, such as: ? Chills. ? Feeling unwell. Get help right away if:  You have difficulty swallowing.  You have worsening pain in your throat, chest, or abdomen.  You vomit bright red blood or a substance that looks like coffee grounds.  You have bloody or very black stools.  You have a fever.  You have a sudden increase in swelling (bloating) in your abdomen. Summary  After the procedure, it is common to feel tired and to have some discomfort in your throat.  Contact your health care provider if you have signs of infection--such as chills or feeling unwell--or if you have pain that does not improve with medicine.  Get help right away if you have trouble swallowing, worsening  pain, bloody or black vomit, bloody or black stools, a fever, or increased swelling in your abdomen.  Keep all follow-up visits as told by your health care provider. This is important. This information is not intended to replace advice given to you by your health care provider. Make sure you discuss any questions you have with your health care provider. Document Revised: 03/03/2017 Document Reviewed: 02/08/2016 Elsevier Patient Education  2020 [employer].   [facility]  Please arrive at least 30 min before your appointment to complete your check in paperwork.  If you are unable to arrive 30 min prior to your appointment time we may have to cancel or reschedule you. LAPAROSCOPIC SURGERY: POST OP INSTRUCTIONS Always review your discharge instruction sheet given to you by the facility where your surgery was performed. IF YOU HAVE DISABILITY OR FAMILY LEAVE FORMS, YOU MUST BRING THEM TO THE OFFICE FOR PROCESSING.   DO NOT GIVE THEM TO YOUR DOCTOR.  PAIN CONTROL  1. First take acetaminophen (Tylenol) AND/or ibuprofen (Advil) to control your pain after surgery.  Follow directions on package.  Taking acetaminophen (Tylenol) and/or ibuprofen (Advil) regularly after surgery will help to control your pain and lower the amount of prescription pain medication you may need.  You should not take more than 4,000 mg (4 grams) of acetaminophen (Tylenol) in 24 hours.  You should not take ibuprofen (Advil), aleve, motrin, naprosyn or other NSAIDS if you have a history of stomach ulcers or chronic kidney disease.  2. A prescription for pain  medication may be given to you upon discharge.  Take your pain medication as prescribed, if you still have uncontrolled pain after taking acetaminophen (Tylenol) or ibuprofen (Advil). 3. Use ice packs to help control pain. 4. If you need a refill on your pain medication, please contact your pharmacy.  They will contact our office to request  authorization. Prescriptions will not be filled after 5pm or on week-ends.  HOME MEDICATIONS 5. Take your usually prescribed medications unless otherwise directed.  DIET 6. You should follow a light diet the first few days after arrival home.  Be sure to include lots of fluids daily. Avoid fatty, fried foods.   CONSTIPATION 7. It is common to experience some constipation after surgery and if you are taking pain medication.  Increasing fluid intake and taking a stool softener (such as Colace) will usually help or prevent this problem from occurring.  A mild laxative (Milk of Magnesia or Miralax) should be taken according to package instructions if there are no bowel movements after 48 hours.  WOUND/INCISION CARE 8. Most patients will experience some swelling and bruising in the area of the incisions.  Ice packs will help.  Swelling and bruising can take several days to resolve.  9. Unless discharge instructions indicate otherwise, follow guidelines below  a. STERI-STRIPS - you may remove your outer bandages 48 hours after surgery, and you may shower at that time.  You have steri-strips (small skin tapes) in place directly over the incision.  These strips should be left on the skin for 7-10 days.   b. DERMABOND/SKIN GLUE - you may shower in 24 hours.  The glue will flake off over the next 2-3 weeks. 10. Any sutures or staples will be removed at the office during your follow-up visit.  ACTIVITIES 11. You may resume regular (light) daily activities beginning the next day--such as daily self-care, walking, climbing stairs--gradually increasing activities as tolerated.  You may have sexual intercourse when it is comfortable.  Refrain from any heavy lifting or straining until approved by your doctor. a. You may drive when you are no longer taking prescription pain medication, you can comfortably wear a seatbelt, and you can safely maneuver your car and apply brakes.  FOLLOW-UP 12. You should see your  doctor in the office for a follow-up appointment approximately 2-3 weeks after your surgery.  You should have been given your post-op/follow-up appointment when your surgery was scheduled.  If you did not receive a post-op/follow-up appointment, make sure that you call for this appointment within a day or two after you arrive home to insure a convenient appointment time.  OTHER INSTRUCTIONS  WHEN TO CALL YOUR DOCTOR: 1. Fever over 101.0 2. Inability to urinate 3. Continued bleeding from incision. 4. Increased pain, redness, or drainage from the incision. 5. Increasing abdominal pain  The clinic staff is available to answer your questions during regular business hours.  Please don't hesitate to call and ask to speak to one of the nurses for clinical concerns.  If you have a medical emergency, go to the nearest emergency room or call 911.  A surgeon from [facility] is always on call at the hospital. [address], [address], [place], [place]  [address] ? [address] [address], [place], [place]   [address] [phone] ? [phone] ? FAX [phone]

## 2020-03-20 NOTE — Progress Notes (Signed)
Progress Note    Jorge Pi.  HYW:[id number] DOB: 30-Nov-1950  DOA: March 20, 2020 PCP: Salley Scarlet, MD    Brief Narrative:     Medical records reviewed and are as summarized below:  Jorge Pi. is an 69 y.o. male with past medical history significant for GERD who presented with progressive abdominal pain.  Noted to have elevated transaminitis and total bilirubin.  CT abdomen/pelvis with IV contrast with intra-/extrahepatic biliary duct dilation with filling defects within the common duct concerning for choledocholithiasis.  Underwent ERCP on 04/03/2020, 3 large common bile duct stones noted, unable to perform a sphincterotomy and stone extraction due to anatomy s/p Billroth II; and had stent placement. 12/16: cholecystectomy.  Unable to get planned ERCP done  Assessment/Plan:   Principal Problem:   Choledocholithiasis Active Problems:   GERD (gastroesophageal reflux disease)   Iron deficiency anemia   Elevated LFTs   Choledocholithiasis --Gastroenterology, Dr. Louie Casa  --Zosyn discontinued by GI on 12/12 --Gen Surgery recommends repeat ERCP and transfer to tertiary center vs surgical management of choledocholithiasis: per Dr. [place]: Received return call from [facility], GI service with Dr. Hildred Laser and Dr. Jamse Mead; no indication for transfer at this time given improving LFTs with stent adequately placed. They recommended surgical consultation if concern for cholecystitis, which General Surgery is currently consulted for here at [facility]. [facility] GI service would be happy to see the patient for outpatient consultation in the few weeks if needed. --s/p cholecystectomy on 12/16 --Pain control and antiemetics --Continue to follow LFTs and CBC daily- elevated LFTs on 12/17 may be from procedure -ambulate -per [name]-- At this time Dr. Meridee Score cannot perform the ERCP in a timely manner as there is no anesthesia support after 3 PM on Monday.  If  he should deteriorate over the weekend, he will need to be transferred to a tertiary care center   hypokalemia -repleted  GERD --Carafate 1 g 4 times daily --Protonix 40 mg p.o. daily  Iron deficiency anemia -ferrous sulfate 325 mg daily at home.  Hiccups -resolved post surgery   Family Communication/Anticipated D/C date and plan/Code Status   DVT prophylaxis: Lovenox ordered. Code Status: Full Code.  Disposition Plan: Status is: Inpatient  Remains inpatient appropriate because:Inpatient level of care appropriate due to severity of illness   Dispo:  Patient From: Home  Planned Disposition: Home  Expected discharge date: 03/11/2020  Medically stable for discharge: No          Medical Consultants:    GI  GS  Subjective:   C/o abdominal pain  Objective:    Vitals:   03/22/2020 1648 03/20/20 0041 03/20/20 0501 03/20/20 1202  BP: 135/79 116/87 103/73 (!) 112/52  Pulse: 97 95 100 73  Resp: 18 18 18 18   Temp: 98.8 F (37.1 C) 98.9 F (37.2 C) 98.6 F (37 C) 98.2 F (36.8 C)  TempSrc: Oral Oral Oral Oral  SpO2: 93% 97% 95% 100%  Weight:      Height:        Intake/Output Summary (Last 24 hours) at 03/20/2020 1506 Last data filed at 03/20/2020 0840 Gross per 24 hour  Intake 537.85 ml  Output 320 ml  Net 217.85 ml   Filed Weights   03-20-20 1442  Weight: 69.9 kg    Exam:   General: Appearance:    Well developed, well nourished male  Who appears to be in pain   Tender to palpation, diminished bowel sounds  Lungs:     respirations unlabored  Heart:    Normal heart rate. Normal rhythm. No murmurs, rubs, or gallops.   MS:   All extremities are intact.   Neurologic:  in bed, eyes closed       Data Reviewed:   I have personally reviewed following labs and imaging studies:  Labs: Labs show the following:   Basic Metabolic Panel: Recent Labs  Lab 03/14/20 0209 03/15/20 0245 03/16/20 0303 03/17/20 0323 03/18/20 0330  03/11/2020 0343 03/20/20 0406  NA 137   < > 133* 134* 137 138 136  K 4.6   < > 3.8 3.8 3.2* 3.2* 4.3  CL 106   < > 102 105 107 107 104  CO2 21*   < > 18* 17* 23 23 23   GLUCOSE 146*   < > 77 59* 118* 105* 163*  BUN 14   < > 11 14 7* <5* 8  CREATININE 1.07   < > 0.83 0.87 0.80 0.72 0.96  CALCIUM 8.5*   < > 8.5* 8.1* 7.9* 7.8* 7.9*  MG 2.1  --   --   --   --   --   --    < > = values in this interval not displayed.   GFR Estimated Creatinine Clearance: 64.6 mL/min (by C-G formula based on SCr of 0.96 mg/dL). Liver Function Tests: Recent Labs  Lab 03/16/20 0303 03/17/20 0323 03/18/20 0330 03/22/2020 0343 03/20/20 0406  AST 33 22 18 16  274*  ALT 80* 52* 38 29 249*  ALKPHOS 141* 121 102 92 100  BILITOT 3.1* 2.6* 1.7* 1.4* 1.8*  PROT 6.0* 5.8* 5.5* 5.1* 5.9*  ALBUMIN 2.9* 2.5* 2.2* 2.1* 2.4*   No results for input(s): LIPASE, AMYLASE in the last 168 hours. No results for input(s): AMMONIA in the last 168 hours. Coagulation profile No results for input(s): INR, PROTIME in the last 168 hours.  CBC: Recent Labs  Lab 03/17/20 0323 03/18/20 0330 03/18/2020 0343 03/17/2020 1543 03/20/20 0406  WBC 13.9* 12.4* 9.9 23.1* 19.1*  HGB 14.4 13.4 12.9* 13.2 13.1  HCT 43.0 39.9 36.6* 40.0 37.5*  MCV 86.3 86.0 85.1 87.1 86.2  PLT 145* 156 159 235 259   Cardiac Enzymes: No results for input(s): CKTOTAL, CKMB, CKMBINDEX, TROPONINI in the last 168 hours. BNP (last 3 results) No results for input(s): PROBNP in the last 8760 hours. CBG: No results for input(s): GLUCAP in the last 168 hours. D-Dimer: No results for input(s): DDIMER in the last 72 hours. Hgb A1c: No results for input(s): HGBA1C in the last 72 hours. Lipid Profile: No results for input(s): CHOL, HDL, LDLCALC, TRIG, CHOLHDL, LDLDIRECT in the last 72 hours. Thyroid function studies: No results for input(s): TSH, T4TOTAL, T3FREE, THYROIDAB in the last 72 hours.  Invalid input(s): FREET3 Anemia work up: No results for  input(s): VITAMINB12, FOLATE, FERRITIN, TIBC, IRON, RETICCTPCT in the last 72 hours. Sepsis Labs: Recent Labs  Lab 03/18/20 0330 03/10/2020 0343 03/10/2020 1543 03/20/20 0406  WBC 12.4* 9.9 23.1* 19.1*    Microbiology Recent Results (from the past 240 hour(s))  Resp Panel by RT-PCR (Flu A&B, Covid) Nasopharyngeal Swab     Status: None   Collection Time: 03/04/2020 12:30 AM   Specimen: Nasopharyngeal Swab; Nasopharyngeal(NP) swabs in vial transport medium  Result Value Ref Range Status   SARS Coronavirus 2 by RT PCR NEGATIVE NEGATIVE Final    Comment: (NOTE) SARS-CoV-2 target nucleic acids are NOT DETECTED.  The SARS-CoV-2 RNA is generally  detectable in upper respiratory specimens during the acute phase of infection. The lowest concentration of SARS-CoV-2 viral copies this assay can detect is 138 copies/mL. A negative result does not preclude SARS-Cov-2 infection and should not be used as the sole basis for treatment or other patient management decisions. A negative result may occur with  improper specimen collection/handling, submission of specimen other than nasopharyngeal swab, presence of viral mutation(s) within the areas targeted by this assay, and inadequate number of viral copies(<138 copies/mL). A negative result must be combined with clinical observations, patient history, and epidemiological information. The expected result is Negative.  Fact Sheet for Patients:  BloggerCourse.com  Fact Sheet for Healthcare Providers:  SeriousBroker.it  This test is no t yet approved or cleared by the [place] FDA and  has been authorized for detection and/or diagnosis of SARS-CoV-2 by FDA under an Emergency Use Authorization (EUA). This EUA will remain  in effect (meaning this test can be used) for the duration of the COVID-19 declaration under Section 564(b)(1) of the Act, 21 U.S.C.section 360bbb-3(b)(1), unless the authorization  is terminated  or revoked sooner.       Influenza A by PCR NEGATIVE NEGATIVE Final   Influenza B by PCR NEGATIVE NEGATIVE Final    Comment: (NOTE) The Xpert Xpress SARS-CoV-2/FLU/RSV plus assay is intended as an aid in the diagnosis of influenza from Nasopharyngeal swab specimens and should not be used as a sole basis for treatment. Nasal washings and aspirates are unacceptable for Xpert Xpress SARS-CoV-2/FLU/RSV testing.  Fact Sheet for Patients: BloggerCourse.com  Fact Sheet for Healthcare Providers: SeriousBroker.it  This test is not yet approved or cleared by the [place] FDA and has been authorized for detection and/or diagnosis of SARS-CoV-2 by FDA under an Emergency Use Authorization (EUA). This EUA will remain in effect (meaning this test can be used) for the duration of the COVID-19 declaration under Section 564(b)(1) of the Act, 21 U.S.C. section 360bbb-3(b)(1), unless the authorization is terminated or revoked.  Performed at [facility], [address] [place], [place] [address]     Procedures and diagnostic studies:  No results found.  Medications:   . acetaminophen  1,000 mg Oral Q8H  . lidocaine  1 patch Transdermal Q24H  . lidocaine  1 application Urethral Once  . mupirocin ointment  1 application Nasal BID  . pantoprazole  40 mg Oral Daily  . sucralfate  1 g Oral TID WC & HS   Continuous Infusions: . dextrose 5 % and 0.9% NaCl 75 mL/hr at 03/20/20 0158  . lactated ringers 10 mL/hr at 03/28/2020 0829  . methocarbamol (ROBAXIN) IV 500 mg (03/18/2020 2242)     LOS: 7 days   Joseph Art  [facility]   How to contact the [facility] Attending or Consulting provider 7A - 7P or covering provider during after hours 7P -7A, for this patient?  1. Check the care team in [facility] and look for a) attending/consulting [facility] provider listed and b) the [facility] team listed 2. Log into [email or web address] and use  [facility]'s universal password to access. If you do not have the password, please contact the hospital operator. 3. Locate the [facility] provider you are looking for under [facility] and page to a number that you can be directly reached. 4. If you still have difficulty reaching the provider, please page the [facility] (Director on Call) for the Hospitalists listed on amion for assistance.  03/20/2020, 3:06 PM

## 2020-03-20 NOTE — Progress Notes (Signed)
[facility] Progress Note  1 Day Post-Op  Subjective: CC-  Patient unable to void this AM, could not get foley in so coude team is coming.  Continues to have a lot of mostly right sided abdominal pain but he looks much more comfortable this AM. Denies n/v.  NPO for possible procedure. Not on schedule for ERCP today. Primary team has spoken with GI who states they are coming to see the patient this morning.  Objective: Vital signs in last 24 hours: Temp:  [97.7 F (36.5 C)-99 F (37.2 C)] 98.6 F (37 C) (12/17 0501) Pulse Rate:  [81-106] 100 (12/17 0501) Resp:  [10-27] 18 (12/17 0501) BP: (103-159)/(68-99) 103/73 (12/17 0501) SpO2:  [92 %-98 %] 95 % (12/17 0501) Last BM Date: 03/18/20  Intake/Output from previous day: 12/16 0701 - 12/17 0700 In: 1687.9 [I.V.:1687.9] Out: 600 [Blood:600] Intake/Output this shift: No intake/output data recorded.  PE: Gen:  Alert, NAD, pleasant HEENT: EOM's intact, pupils equal and round Pulm:  rate and effort normal Abd: Soft, mild distension, TTP RUQ with some guarding but no rebound and no peritonitis, incisions C/D/I Psych: A&Ox4  Skin: no rashes noted, warm and dry  Lab Results:  Recent Labs    Mar 26, 2020 1543 03/20/20 0406  WBC 23.1* 19.1*  HGB 13.2 13.1  HCT 40.0 37.5*  PLT 235 259   BMET Recent Labs    03/25/2020 0343 03/20/20 0406  NA 138 136  K 3.2* 4.3  CL 107 104  CO2 23 23  GLUCOSE 105* 163*  BUN <5* 8  CREATININE 0.72 0.96  CALCIUM 7.8* 7.9*   PT/INR No results for input(s): LABPROT, INR in the last 72 hours. CMP     Component Value Date/Time   NA 136 03/20/2020 0406   K 4.3 03/20/2020 0406   CL 104 03/20/2020 0406   CO2 23 03/20/2020 0406   GLUCOSE 163 (H) 03/20/2020 0406   BUN 8 03/20/2020 0406   CREATININE 0.96 03/20/2020 0406   CREATININE 0.93 11/19/2019 0842   CALCIUM 7.9 (L) 03/20/2020 0406   PROT 5.9 (L) 03/20/2020 0406   ALBUMIN 2.4 (L) 03/20/2020 0406   AST 274 (H) 03/20/2020  0406   ALT 249 (H) 03/20/2020 0406   ALKPHOS 100 03/20/2020 0406   BILITOT 1.8 (H) 03/20/2020 0406   GFRNONAA >60 03/20/2020 0406   GFRAA >60 11/11/2016 1600   Lipase     Component Value Date/Time   LIPASE 20 03/08/2020 1452       Studies/Results: No results found.  Anti-infectives: Anti-infectives (From admission, onward)   Start     Dose/Rate Route Frequency Ordered Stop   03/30/2020 0600  ceFAZolin (ANCEF) IVPB 2g/100 mL premix        2 g 200 mL/hr over 30 Minutes Intravenous On call to O.R. 03/18/20 1334 03/20/20 0559   03/22/2020 0600  piperacillin-tazobactam (ZOSYN) IVPB 3.375 g  Status:  Discontinued        3.375 g 12.5 mL/hr over 240 Minutes Intravenous Every 8 hours 03/11/2020 0046 03/15/20 1109   03/17/2020 2330  piperacillin-tazobactam (ZOSYN) IVPB 3.375 g        3.375 g 100 mL/hr over 30 Minutes Intravenous  Once 03/08/2020 2317 03/25/2020 0131       Assessment/Plan GERD IDA PMH Bilroth II for bleeding gastric ulcers  ABL anemia - Hgb 13.1 from 12.9 yesterday postop, stable  Symptomatic cholelithiasis Choledocholithiasis without evidence of acute cholecystitis  S/p ERCP, placement of CBD stent 12/10 [name] -  unable to perform a sphincterotomy and stone extraction due to anatomy s/p Billroth II - GI arranging repeat ERCP at some point, they will see the patient today S/p laparoscopic cholecystectomy 12/16 Dr. Janee Morn - POD#1 - LFTs slightly elevated, may be 2/2 procedure yesterday, repeat CMP in AM - If patient is not undergoing ERCP today, ok for clear liquids and advance diet as tolerated. Would also like to add PO pain medications once on a diet. Mobilize.   FEN: NPO for possible procedure VTE: SCDs, ok to restart lovenox if not having a procedure today ID: Ancef periop Foley: coude being placed today for retention Follow up: [facility] clinic   LOS: 7 days    Franne Forts, [facility] 03/20/2020, 8:41 AM Please see Amion for pager  number during day hours 7:00am-4:30pm

## 2020-03-21 ENCOUNTER — Inpatient Hospital Stay (HOSPITAL_COMMUNITY): Payer: Medicare Other

## 2020-03-21 ENCOUNTER — Encounter (HOSPITAL_COMMUNITY): Payer: Self-pay | Admitting: Internal Medicine

## 2020-03-21 LAB — COMPREHENSIVE METABOLIC PANEL
ALT: 185 U/L — ABNORMAL HIGH (ref 0–44)
AST: 104 U/L — ABNORMAL HIGH (ref 15–41)
Albumin: 2.2 g/dL — ABNORMAL LOW (ref 3.5–5.0)
Alkaline Phosphatase: 89 U/L (ref 38–126)
Anion gap: 12 (ref 5–15)
BUN: 21 mg/dL (ref 8–23)
CO2: 18 mmol/L — ABNORMAL LOW (ref 22–32)
Calcium: 7.8 mg/dL — ABNORMAL LOW (ref 8.9–10.3)
Chloride: 109 mmol/L (ref 98–111)
Creatinine, Ser: 2 mg/dL — ABNORMAL HIGH (ref 0.61–1.24)
GFR, Estimated: 35 mL/min — ABNORMAL LOW (ref >60)
Glucose, Bld: 140 mg/dL — ABNORMAL HIGH (ref 70–99)
Potassium: 4.4 mmol/L (ref 3.5–5.1)
Sodium: 139 mmol/L (ref 135–145)
Total Bilirubin: 1.9 mg/dL — ABNORMAL HIGH (ref 0.3–1.2)
Total Protein: 5.5 g/dL — ABNORMAL LOW (ref 6.5–8.1)

## 2020-03-21 LAB — URINALYSIS, ROUTINE W REFLEX MICROSCOPIC
Bacteria, UA: NONE SEEN
Bilirubin Urine: NEGATIVE
Glucose, UA: NEGATIVE mg/dL
Hgb urine dipstick: NEGATIVE
Ketones, ur: NEGATIVE mg/dL
Leukocytes,Ua: NEGATIVE
Nitrite: NEGATIVE
Protein, ur: 100 mg/dL — AB
Specific Gravity, Urine: >1.046 — ABNORMAL HIGH (ref 1.005–1.030)
pH: 5 (ref 5.0–8.0)

## 2020-03-21 LAB — CBC
HCT: 39.6 % (ref 39.0–52.0)
Hemoglobin: 12.8 g/dL — ABNORMAL LOW (ref 13.0–17.0)
MCH: 28.8 pg (ref 26.0–34.0)
MCHC: 32.3 g/dL (ref 30.0–36.0)
MCV: 89 fL (ref 80.0–100.0)
Platelets: 254 10*3/uL (ref 150–400)
RBC: 4.45 MIL/uL (ref 4.22–5.81)
RDW: 13.2 % (ref 11.5–15.5)
WBC: 20 10*3/uL — ABNORMAL HIGH (ref 4.0–10.5)
nRBC: 0 % (ref 0.0–0.2)

## 2020-03-21 LAB — BASIC METABOLIC PANEL
Anion gap: 13 (ref 5–15)
BUN: 26 mg/dL — ABNORMAL HIGH (ref 8–23)
CO2: 18 mmol/L — ABNORMAL LOW (ref 22–32)
Calcium: 7.6 mg/dL — ABNORMAL LOW (ref 8.9–10.3)
Chloride: 108 mmol/L (ref 98–111)
Creatinine, Ser: 1.63 mg/dL — ABNORMAL HIGH (ref 0.61–1.24)
GFR, Estimated: 45 mL/min — ABNORMAL LOW (ref >60)
Glucose, Bld: 131 mg/dL — ABNORMAL HIGH (ref 70–99)
Potassium: 4.1 mmol/L (ref 3.5–5.1)
Sodium: 139 mmol/L (ref 135–145)

## 2020-03-21 MED ORDER — PIPERACILLIN-TAZOBACTAM 3.375 G IVPB
3.3750 g | Freq: Three times a day (TID) | INTRAVENOUS | Status: DC
  Administered 2020-03-21 – 2020-03-28 (×23): 3.375 g via INTRAVENOUS
  Filled 2020-03-21 (×26): qty 50

## 2020-03-21 MED ORDER — LIDOCAINE HCL 1 % IJ SOLN
INTRAMUSCULAR | Status: AC
  Filled 2020-03-21: qty 20

## 2020-03-21 MED ORDER — MIDAZOLAM HCL 2 MG/2ML IJ SOLN
INTRAMUSCULAR | Status: AC | PRN
  Administered 2020-03-21 (×2): 0.5 mg via INTRAVENOUS

## 2020-03-21 MED ORDER — MIDAZOLAM HCL 2 MG/2ML IJ SOLN
INTRAMUSCULAR | Status: AC
  Filled 2020-03-21: qty 2

## 2020-03-21 MED ORDER — PANTOPRAZOLE SODIUM 40 MG IV SOLR
40.0000 mg | INTRAVENOUS | Status: DC
  Administered 2020-03-21: 09:00:00 40 mg via INTRAVENOUS
  Filled 2020-03-21: qty 40

## 2020-03-21 MED ORDER — PANTOPRAZOLE SODIUM 40 MG IV SOLR
40.0000 mg | Freq: Two times a day (BID) | INTRAVENOUS | Status: DC
  Administered 2020-03-21 – 2020-04-03 (×27): 40 mg via INTRAVENOUS
  Filled 2020-03-21 (×27): qty 40

## 2020-03-21 MED ORDER — DICYCLOMINE HCL 10 MG PO CAPS
10.0000 mg | ORAL_CAPSULE | Freq: Three times a day (TID) | ORAL | Status: DC | PRN
  Administered 2020-03-23: 21:00:00 10 mg via ORAL
  Filled 2020-03-21 (×2): qty 1

## 2020-03-21 MED ORDER — MIDAZOLAM HCL 5 MG/5ML IJ SOLN
INTRAMUSCULAR | Status: AC | PRN
  Administered 2020-03-21: 0.5 mg via INTRAVENOUS

## 2020-03-21 MED ORDER — FENTANYL CITRATE (PF) 100 MCG/2ML IJ SOLN
INTRAMUSCULAR | Status: AC | PRN
  Administered 2020-03-21: 25 ug via INTRAVENOUS
  Administered 2020-03-21: 50 ug via INTRAVENOUS

## 2020-03-21 MED ORDER — DICYCLOMINE HCL 10 MG PO CAPS
10.0000 mg | ORAL_CAPSULE | Freq: Once | ORAL | Status: AC
  Administered 2020-03-21: 16:00:00 10 mg via ORAL
  Filled 2020-03-21: qty 1

## 2020-03-21 MED ORDER — FENTANYL CITRATE (PF) 100 MCG/2ML IJ SOLN
INTRAMUSCULAR | Status: AC
  Filled 2020-03-21: qty 2

## 2020-03-21 MED ORDER — METHOCARBAMOL 1000 MG/10ML IJ SOLN
500.0000 mg | Freq: Three times a day (TID) | INTRAVENOUS | Status: DC
  Administered 2020-03-21 – 2020-03-22 (×3): 500 mg via INTRAVENOUS
  Filled 2020-03-21 (×4): qty 5

## 2020-03-21 NOTE — Progress Notes (Signed)
[facility] Progress Note  2 Days Post-Op  Subjective: CC-  CT scan yesterday revealed moderate volume perihepatic free fluid containing foci of air with non organized fluid and air in the gallbladder fossa concerning for biloma. IR was consulted and placed a perc drain this morning yielding large volume dark bilious fluid. Wife at bedside. Patient denies pain but intermittently moans. Patient's sister was also on the phone while I was in the room who is very upset and wants to know the plan.  Objective: Vital signs in last 24 hours: Temp:  [97.7 F (36.5 C)-99.2 F (37.3 C)] 97.7 F (36.5 C) (12/18 1210) Pulse Rate:  [82-108] 82 (12/18 1210) Resp:  [12-31] 16 (12/18 1210) BP: (101-147)/(52-85) 138/52 (12/18 1210) SpO2:  [92 %-100 %] 94 % (12/18 1210) Last BM Date: 03/18/20  Intake/Output from previous day: 12/17 0701 - 12/18 0700 In: 3005.2 [P.O.:60; I.V.:2872.8; IV Piggyback:72.4] Out: 540 [Urine:540] Intake/Output this shift: Total I/O In: 512.6 [I.V.:484.9; IV Piggyback:27.7] Out: 745 [Drains:745]  PE: Gen:  Alert, NAD HEENT: EOM's intact, pupils equal and round Pulm:  rate and effort normal Abd: Soft, mild distension, TTP RUQ with some guarding but no rebound and no peritonitis, incisions C/D/I, RUQ drain in place with bilious fluid in bulb Psych: A&Ox4  Skin: no rashes noted, warm and dry   Lab Results:  Recent Labs    03/20/20 0406 03/21/20 0455  WBC 19.1* 20.0*  HGB 13.1 12.8*  HCT 37.5* 39.6  PLT 259 254   BMET Recent Labs    03/20/20 0406 03/21/20 0455  NA 136 139  K 4.3 4.4  CL 104 109  CO2 23 18*  GLUCOSE 163* 140*  BUN 8 21  CREATININE 0.96 2.00*  CALCIUM 7.9* 7.8*   PT/INR No results for input(s): LABPROT, INR in the last 72 hours. CMP     Component Value Date/Time   NA 139 03/21/2020 0455   K 4.4 03/21/2020 0455   CL 109 03/21/2020 0455   CO2 18 (L) 03/21/2020 0455   GLUCOSE 140 (H) 03/21/2020 0455   BUN 21 03/21/2020  0455   CREATININE 2.00 (H) 03/21/2020 0455   CREATININE 0.93 11/19/2019 0842   CALCIUM 7.8 (L) 03/21/2020 0455   PROT 5.5 (L) 03/21/2020 0455   ALBUMIN 2.2 (L) 03/21/2020 0455   AST 104 (H) 03/21/2020 0455   ALT 185 (H) 03/21/2020 0455   ALKPHOS 89 03/21/2020 0455   BILITOT 1.9 (H) 03/21/2020 0455   GFRNONAA 35 (L) 03/21/2020 0455   GFRAA >60 11/11/2016 1600   Lipase     Component Value Date/Time   LIPASE 20 03/05/2020 1452       Studies/Results: CT ABDOMEN PELVIS W CONTRAST  Result Date: 03/20/2020 CLINICAL DATA:  Umbilical pain after laparoscopic cholecystectomy yesterday. EXAM: CT ABDOMEN AND PELVIS WITH CONTRAST TECHNIQUE: Multidetector CT imaging of the abdomen and pelvis was performed using the standard protocol following bolus administration of intravenous contrast. CONTRAST:  OMNIPAQUE IOHEXOL 300 MG/ML  SOLN COMPARISON:  CT 03/29/2020 FINDINGS: Lower chest: Small right and trace left pleural effusion. Associated compressive atelectasis. There are coronary artery calcifications. Fluid distends the distal esophagus. Hepatobiliary: Interval cholecystectomy with clips in the gallbladder fossa. Persistent but diminished biliary dilatation in the left lobe of the liver with new pneumobilia, likely sequela of ERCP. Plastic common bile duct stent. Common bile duct proximal to the stent measures 18 mm. Rounded density in the common bile duct measuring 9 mm may represent a retained stone  at the proximal aspect of the stent, series 6, image 77. No focal hepatic lesion. Moderate volume perihepatic free fluid containing foci of air with non organized fluid and air in the gallbladder fossa. Pancreas: Parenchymal atrophy. No ductal dilatation or inflammation. Spleen: Normal in size without focal abnormality. Adrenals/Urinary Tract: No adrenal nodule. No hydronephrosis or perinephric edema. Homogeneous renal enhancement . Absent renal excretion on delayed phase imaging. Slight bladder wall  thickening and questionable bladder diverticulum at the dome, unchanged. Stomach/Bowel: Fluid distends the distal esophagus. Surgical clips at the gastroesophageal junction. Stomach is decompressed. Prior gastric surgery with suspected gastrojejunostomy. Decompressed small bowel without wall thickening or obstruction. Normal appendix. Small to moderate colonic stool burden without colonic inflammation. Mild distal diverticulosis without diverticulitis. Vascular/Lymphatic: Aortic and branch atherosclerosis. No aortic aneurysm. No portal vein thrombosis. No abdominopelvic adenopathy. Reproductive: Prostate is unremarkable. Other: Moderate volume of perihepatic fluid which measures simple fluid density. Multiple foci of internal air. Ill-defined air fluid collection in the gallbladder fossa without peripherally enhancing collection. Occasional foci of free air in the upper abdomen not unexpected post recent surgery. Scattered air in the subcutaneous tissues from recent laparoscopy. Trace free fluid tracks into the pelvis. Musculoskeletal: S shaped scoliosis and multilevel degenerative change in the spine. Chronic dysplastic deformity of the left acetabulum and proximal femur. There are no acute or suspicious osseous abnormalities. IMPRESSION: 1. Interval cholecystectomy. Moderate volume of perihepatic free fluid containing foci of air with non organized fluid and air in the gallbladder fossa. Given the amount of fluid, biloma is favored over postoperative seroma. Sterility is indeterminate by imaging. Consider nuclear medicine hepatic biliary scan to assess for bile leak. 2. Plastic common bile duct stent in place. Suspected residual 9 mm stone at the proximal aspect of the stent. Common bile duct dilatation of 16 mm, with improved but persistent intrahepatic biliary dilatation on the left. Pneumobilia related to recent procedure. 3. Small right and trace left pleural effusions. 4. Absent renal excretion on delayed  phase imaging, can be seen with renal dysfunction. 5. Mild distal colonic diverticulosis without diverticulitis. Aortic Atherosclerosis (ICD10-I70.0). Electronically Signed   By: Narda Rutherford M.D.   On: 03/20/2020 23:11   CT IMAGE GUIDED FLUID DRAIN BY CATHETER  Result Date: 03/21/2020 CLINICAL DATA:  Status post cholecystectomy on 03/10/2020 with development of significant perihepatic fluid suspicious for bile leak. EXAM: CT GUIDED CATHETER DRAINAGE OF PERIHEPATIC ABSCESS ANESTHESIA/SEDATION: 1.5 mg IV Versed 75 mcg IV Fentanyl Total Moderate Sedation Time:  13 minutes The patient's level of consciousness and physiologic status were continuously monitored during the procedure by Radiology nursing. PROCEDURE: The procedure, risks, benefits, and alternatives were explained to the patient. Questions regarding the procedure were encouraged and answered. The patient understands and consents to the procedure. A time out was performed prior to initiating the procedure. CT was performed through the upper abdomen in a supine position. The right abdominal wall was prepped with chlorhexidine in a sterile fashion, and a sterile drape was applied covering the operative field. A sterile gown and sterile gloves were used for the procedure. Local anesthesia was provided with 1% Lidocaine. Under CT guidance, a 18 gauge trocar needle was advanced into the lateral perihepatic space. After return of fluid, a guidewire was advanced, tract dilatation performed over the wire and a 12 French percutaneous drainage catheter advanced over the wire. Drainage catheter position was confirmed by CT. A fluid sample was withdrawn and sent for culture analysis. The drain was connected to a suction bulb.  The bulb was emptied multiple times during the procedure. The drain was secured at the skin with a Prolene retention suture and StatLock device. COMPLICATIONS: None FINDINGS: Contiguous fluid is present beginning in the subdiaphragmatic  space and continuing in the lateral perihepatic space. There is a relatively small amount of fluid in the gallbladder fossa. There is a small amount of gas in the perihepatic fluid. After needle puncture and drain placement, there is return of dark, bilious fluid. After drain placement, there is rapid drainage of bilious fluid and several hundred mL of fluid was removed immediately after drain placement. IMPRESSION: CT-guided percutaneous catheter drainage of perihepatic fluid collection yielding dark, bilious fluid. A fluid sample was sent for culture analysis. A 12 French drain was placed in the lateral perihepatic space and attached to suction bulb drainage with rapid removal of bilious fluid. Electronically Signed   By: Irish Lack M.D.   On: 03/21/2020 11:07    Anti-infectives: Anti-infectives (From admission, onward)   Start     Dose/Rate Route Frequency Ordered Stop   03/21/20 0200  piperacillin-tazobactam (ZOSYN) IVPB 3.375 g        3.375 g 12.5 mL/hr over 240 Minutes Intravenous Every 8 hours 03/21/20 0007     03/28/2020 0600  ceFAZolin (ANCEF) IVPB 2g/100 mL premix        2 g 200 mL/hr over 30 Minutes Intravenous On call to O.R. 03/18/20 1334 03/20/20 0559   03/08/2020 0600  piperacillin-tazobactam (ZOSYN) IVPB 3.375 g  Status:  Discontinued        3.375 g 12.5 mL/hr over 240 Minutes Intravenous Every 8 hours 03/10/2020 0046 03/15/20 1109   04/02/2020 2330  piperacillin-tazobactam (ZOSYN) IVPB 3.375 g        3.375 g 100 mL/hr over 30 Minutes Intravenous  Once 03/26/2020 2317 03/17/2020 0131       Assessment/Plan GERD IDA PMH Bilroth II for bleeding gastric ulcers  ABL anemia - Hgb 12.8, stable  Symptomatic cholelithiasis Choledocholithiasis without evidence of acute cholecystitis  S/p ERCP, placement of CBD stent 12/10 [name] - unable to perform a sphincterotomy and stone extraction due to anatomy s/p Billroth II - GI arranging repeat ERCP at some point with Dr. Johnella Moloney,  unsure when this is going to happen but I spoke with [name] who will work on this and update family, he also recommends initiating transfer to tertiary care facility as repeat ERCP here may not happen quickly  S/p laparoscopic cholecystectomy 12/16 Dr. Janee Morn Postoperative bile leak - POD#2 - CT scan 12/17 revealed moderate volume perihepatic free fluid containing foci of air with non organized fluid and air in the gallbladder fossa concerning for biloma - s/p IR drain placement 12/18 with yield high volume bilious fluid, culture pending - Bile leak is contained with drain and he already has a stent across the common bile duct. Ok for clear liquids and ADAT from surgical standpoint. Mobilize.   ID: [id number] 12/18>> FEN:IVF, CLD VTE: SCDs, ok to restart lovenox from surgical standpoint XH:[id number] periop Foley: none   LOS: 8 days    Franne Forts, [facility] 03/21/2020, 12:46 PM Please see Amion for pager number during day hours 7:00am-4:30pm

## 2020-03-21 NOTE — Progress Notes (Signed)
Chief Complaint: Patient was seen in consultation today for perihepatic fluid collection  Referring Physician(s): Carlena Bjornstad PA-C  Supervising Physician: Irish Lack  Patient Status: Jorge Evans - In-pt  History of Present Illness: Jorge Evans. is a 69 y.o. male with recent hx of ERCP with stent on 12/10 for choledocholithiasis and then lap chole on 12/16. He developed worsening abd pain yesterday and CT scan shows perihepatic and gb fossa fluid collections concerning for biloma or leak. IR is asked to place perc drain for evacuation. PMHx, meds, labs, imaging, allergies reviewed. Has been NPO today as directed. Family at bedside.    Past Medical History:  Diagnosis Date  . Anemia   . GERD (gastroesophageal reflux disease)     Past Surgical History:  Procedure Laterality Date  . BILIARY STENT PLACEMENT  03/16/2020   Procedure: BILIARY STENT PLACEMENT;  Surgeon: Jeani Hawking, MD;  Location: [facility] ENDOSCOPY;  Service: Endoscopy;;  . CHOLECYSTECTOMY N/A 03/06/2020   Procedure: LAPAROSCOPIC CHOLECYSTECTOMY;  Surgeon: Violeta Gelinas, MD;  Location: [facility] OR;  Service: General;  Laterality: N/A;  . ERCP N/A 04/02/2020   Procedure: ENDOSCOPIC RETROGRADE CHOLANGIOPANCREATOGRAPHY (ERCP);  Surgeon: Jeani Hawking, MD;  Location: [facility] ENDOSCOPY;  Service: Endoscopy;  Laterality: N/A;  . LUMBAR FUSION    . STOMACH SURGERY      Allergies: Other and Demerol [meperidine]  Medications:  Current Facility-Administered Medications:  .  acetaminophen (TYLENOL) tablet 650 mg, 650 mg, Oral, Q6H PRN, 650 mg at 03/20/20 1022 **OR** acetaminophen (TYLENOL) suppository 650 mg, 650 mg, Rectal, Q6H PRN, Simaan, Elizabeth S, PA-C .  acetaminophen (TYLENOL) tablet 1,000 mg, 1,000 mg, Oral, Q8H, Simaan, Elizabeth S, PA-C, 1,000 mg at 03/20/20 2300 .  HYDROmorphone (DILAUDID) injection 1-2 mg, 1-2 mg, Intravenous, Q3H PRN, Meuth, Brooke A, PA-C, 1 mg at 03/21/20 0532 .  lactated ringers infusion,  , Intravenous, Continuous, Joseph Art, DO, Last Rate: 100 mL/hr at 03/20/20 2351, Rate Change at 03/20/20 2351 .  lidocaine (LIDODERM) 5 % 1 patch, 1 patch, Transdermal, Q24H, Simaan, Francine Graven, PA-C, 1 patch at 03/20/20 1257 .  lidocaine (XYLOCAINE) 2 % jelly 1 application, 1 application, Urethral, Once, Vann, Jessica U, DO .  methocarbamol (ROBAXIN) 500 mg in dextrose 5 % 50 mL IVPB, 500 mg, Intravenous, Q6H PRN, Meuth, Brooke A, PA-C, Last Rate: 100 mL/hr at 03/25/2020 2242, 500 mg at 03/31/2020 2242 .  metoCLOPramide (REGLAN) injection 5 mg, 5 mg, Intravenous, Q6H PRN, Adam Phenix, PA-C, 5 mg at 03/15/2020 0610 .  mupirocin ointment (BACTROBAN) 2 % 1 application, 1 application, Nasal, BID, Adam Phenix, PA-C, 1 application at 03/20/20 2300 .  oxyCODONE (Oxy IR/ROXICODONE) immediate release tablet 5-10 mg, 5-10 mg, Oral, Q4H PRN, Meuth, Brooke A, PA-C .  pantoprazole (PROTONIX) injection 40 mg, 40 mg, Intravenous, Q24H, Calton Dach I, RPH .  piperacillin-tazobactam (ZOSYN) IVPB 3.375 g, 3.375 g, Intravenous, Q8H, Vann, Jessica U, DO, Last Rate: 12.5 mL/hr at 03/21/20 0209, 3.375 g at 03/21/20 0209 .  promethazine (PHENERGAN) injection 6.25 mg, 6.25 mg, Intravenous, Q6H PRN, Simaan, Elizabeth S, PA-C .  sucralfate (CARAFATE) tablet 1 g, 1 g, Oral, TID WC & HS, Simaan, Elizabeth S, PA-C, 1 g at 03/20/20 2259    Family History  Problem Relation Age of Onset  . Heart attack Mother   . Emphysema Father   . Cancer Father        lung    Social History   Socioeconomic History  . Marital status:  Married    Spouse name: [place]  . Number of children: 1  . Years of education: 12 +  . Highest education level: Not on file  Occupational History  . Occupation: Retired  Tobacco Use  . Smoking status: Former Smoker    Quit date: 04/04/1998    Years since quitting: 21.9  . Smokeless tobacco: Never Used  Vaping Use  . Vaping Use: Never used  Substance and Sexual Activity   . Alcohol use: No  . Drug use: No  . Sexual activity: Not Currently  Other Topics Concern  . Not on file  Social History Narrative   Lives w/ wife   Caffeine use: 2 cups coffee per week   Social Determinants of Health   Financial Resource Strain: Not on file  Food Insecurity: Not on file  Transportation Needs: Not on file  Physical Activity: Not on file  Stress: Not on file  Social Connections: Not on file     Review of Systems: A 12 point ROS discussed and pertinent positives are indicated in the HPI above.  All other systems are negative.  Review of Systems  Vital Signs: BP 128/62 (BP Location: Left Arm)   Pulse 97   Temp 98.2 F (36.8 C) (Oral)   Resp 19   Ht  (1.676 m)   Wt 69.9 kg   SpO2 94%   BMI 24.86 kg/m   Physical Exam Constitutional:      Appearance: He is ill-appearing. He is not toxic-appearing.  HENT:     Mouth/Throat:     Mouth: Mucous membranes are moist.     Pharynx: Oropharynx is clear.  Cardiovascular:     Rate and Rhythm: Normal rate and regular rhythm.     Heart sounds: Normal heart sounds.  Pulmonary:     Effort: Pulmonary effort is normal. No respiratory distress.     Breath sounds: Normal breath sounds.  Abdominal:     Palpations: Abdomen is soft.     Comments: Generalized tenderness.  Skin:    General: Skin is warm and dry.     Coloration: Skin is not jaundiced.  Neurological:     General: No focal deficit present.  Psychiatric:        Mood and Affect: Mood normal.        Thought Content: Thought content normal.        Judgment: Judgment normal.     Imaging: DG Abd 1 View  Result Date: 03/17/2020 CLINICAL DATA:  Abdominal pain.  Vomiting. EXAM: ABDOMEN - 1 VIEW COMPARISON:  ERCP 03/20/2020.  CT 04/01/2020. FINDINGS: Surgical clips upper abdomen. Biliary stent noted in good anatomic position. No bowel distention or free air. Aortoiliac atherosclerotic vascular calcification. Lumbar spine degenerative changes scoliosis.  Degenerative changes both hips. Stable deformity left hip. IMPRESSION: 1. Biliary stent noted in good anatomic position. No bowel distention or free air. 2. Aortoiliac atherosclerotic vascular disease. Electronically Signed   By: Maisie Fus  Register   On: 03/17/2020 07:34   CT ABDOMEN PELVIS W CONTRAST  Result Date: 03/20/2020 CLINICAL DATA:  Umbilical pain after laparoscopic cholecystectomy yesterday. EXAM: CT ABDOMEN AND PELVIS WITH CONTRAST TECHNIQUE: Multidetector CT imaging of the abdomen and pelvis was performed using the standard protocol following bolus administration of intravenous contrast. CONTRAST:  OMNIPAQUE IOHEXOL 300 MG/ML  SOLN COMPARISON:  CT 03/04/2020 FINDINGS: Lower chest: Small right and trace left pleural effusion. Associated compressive atelectasis. There are coronary artery calcifications. Fluid distends the distal esophagus. Hepatobiliary: Interval  cholecystectomy with clips in the gallbladder fossa. Persistent but diminished biliary dilatation in the left lobe of the liver with new pneumobilia, likely sequela of ERCP. Plastic common bile duct stent. Common bile duct proximal to the stent measures 18 mm. Rounded density in the common bile duct measuring 9 mm may represent a retained stone at the proximal aspect of the stent, series 6, image 77. No focal hepatic lesion. Moderate volume perihepatic free fluid containing foci of air with non organized fluid and air in the gallbladder fossa. Pancreas: Parenchymal atrophy. No ductal dilatation or inflammation. Spleen: Normal in size without focal abnormality. Adrenals/Urinary Tract: No adrenal nodule. No hydronephrosis or perinephric edema. Homogeneous renal enhancement . Absent renal excretion on delayed phase imaging. Slight bladder wall thickening and questionable bladder diverticulum at the dome, unchanged. Stomach/Bowel: Fluid distends the distal esophagus. Surgical clips at the gastroesophageal junction. Stomach is decompressed.  Prior gastric surgery with suspected gastrojejunostomy. Decompressed small bowel without wall thickening or obstruction. Normal appendix. Small to moderate colonic stool burden without colonic inflammation. Mild distal diverticulosis without diverticulitis. Vascular/Lymphatic: Aortic and branch atherosclerosis. No aortic aneurysm. No portal vein thrombosis. No abdominopelvic adenopathy. Reproductive: Prostate is unremarkable. Other: Moderate volume of perihepatic fluid which measures simple fluid density. Multiple foci of internal air. Ill-defined air fluid collection in the gallbladder fossa without peripherally enhancing collection. Occasional foci of free air in the upper abdomen not unexpected post recent surgery. Scattered air in the subcutaneous tissues from recent laparoscopy. Trace free fluid tracks into the pelvis. Musculoskeletal: S shaped scoliosis and multilevel degenerative change in the spine. Chronic dysplastic deformity of the left acetabulum and proximal femur. There are no acute or suspicious osseous abnormalities. IMPRESSION: 1. Interval cholecystectomy. Moderate volume of perihepatic free fluid containing foci of air with non organized fluid and air in the gallbladder fossa. Given the amount of fluid, biloma is favored over postoperative seroma. Sterility is indeterminate by imaging. Consider nuclear medicine hepatic biliary scan to assess for bile leak. 2. Plastic common bile duct stent in place. Suspected residual 9 mm stone at the proximal aspect of the stent. Common bile duct dilatation of 16 mm, with improved but persistent intrahepatic biliary dilatation on the left. Pneumobilia related to recent procedure. 3. Small right and trace left pleural effusions. 4. Absent renal excretion on delayed phase imaging, can be seen with renal dysfunction. 5. Mild distal colonic diverticulosis without diverticulitis. Aortic Atherosclerosis (ICD10-I70.0). Electronically Signed   By: Narda Rutherford M.D.    On: 03/20/2020 23:11   CT ABDOMEN PELVIS W CONTRAST  Result Date: 03/08/2020 CLINICAL DATA:  Nausea and vomiting EXAM: CT ABDOMEN AND PELVIS WITH CONTRAST TECHNIQUE: Multidetector CT imaging of the abdomen and pelvis was performed using the standard protocol following bolus administration of intravenous contrast. CONTRAST:  OMNIPAQUE IOHEXOL 300 MG/ML  SOLN COMPARISON:  CT 12/23/2016 FINDINGS: Lower chest: Lung bases demonstrate mild scarring at the left base. No acute consolidation or effusion. Surgical clips at the GE junction. Hepatobiliary: Gallbladder appears slightly dilated. No calcified gallstones. Interval development of moderate intra and extrahepatic biliary dilatation, common duct dilated up to 14 mm. Multiple rounded filling defects within the mid to distal common duct, series 5, image number 45, suspicious for stones. Pancreas: Fatty atrophy without inflammatory change Spleen: Normal in size without focal abnormality. Adrenals/Urinary Tract: Adrenal glands are normal. Kidneys show no hydronephrosis. Slightly thick-walled appearance of urinary bladder without inflammatory change. Streaky hyperdense foci in the bladder with more focal hyperdensity along the left posterior  bladder near the ureteral insertion, suspect that this is due to excreted contrast Stomach/Bowel: Status post partial gastrectomy with small bowel anastomosis within the anterior abdomen. No evidence for obstruction. No acute bowel wall thickening. Negative appendix. Vascular/Lymphatic: Moderate aortic atherosclerosis. No aneurysm. No suspicious nodes. Reproductive: Prostate is unremarkable. Other: No free air or free fluid. Small fat containing umbilical hernia. Musculoskeletal: Scoliosis and postsurgical changes of the lumbar spine. Dysplastic appearing left acetabulum and femoral head and neck, chronic. IMPRESSION: 1. Interval development of moderate intra and extrahepatic biliary dilatation with multiple rounded filling  defects within the mid to distal common duct, suspicious for choledocholithiasis. Suggest correlation with LFTs, further evaluation with MRCP may be obtained. 2. Streaky hyperdense foci within the bladder, suspect that this is due to excreted contrast as opposed to bladder mass. 3. Status post partial gastrectomy with no evidence for an obstruction. 4. Aortic atherosclerosis. Aortic Atherosclerosis (ICD10-I70.0). Electronically Signed   By: Jasmine Pang M.D.   On: 03/11/2020 23:04   DG ERCP BILIARY & PANCREATIC DUCTS  Result Date: 03/09/2020 CLINICAL DATA:  69 year old male undergoing ERCP for choledocholithiasis EXAM: ERCP TECHNIQUE: Multiple spot images obtained with the fluoroscopic device and submitted for interpretation post-procedure. FLUOROSCOPY TIME:  Fluoroscopy Time:  3 minutes 36 seconds Radiation Exposure Index (if provided by the fluoroscopic device): 46.24 mGy COMPARISON:  CT abdomen/pelvis 03/22/2020 FINDINGS: A total of 4 intraoperative saved images are submitted for review. The images demonstrate a flexible duodenal scope in the descending duodenum with wire cannulation of the common bile duct. Cholangiogram demonstrates intra and extrahepatic biliary ductal dilatation. Multiple filling defects in the distal common bile duct consistent with choledocholithiasis. Subsequent images demonstrate sphincterotomy and balloon sweeping of the duct. IMPRESSION: 1. Choledocholithiasis. 2. ERCP with sphincterotomy and presumed sweeping of the common duct. These images were submitted for radiologic interpretation only. Please see the procedural report for the amount of contrast and the fluoroscopy time utilized. Electronically Signed   By: Malachy Moan M.D.   On: 03/06/2020 15:47    Labs:  CBC: Recent Labs    03/18/2020 0343 03/18/2020 1543 03/20/20 0406 03/21/20 0455  WBC 9.9 23.1* 19.1* 20.0*  HGB 12.9* 13.2 13.1 12.8*  HCT 36.6* 40.0 37.5* 39.6  PLT 159 235 259 254    COAGS: No  results for input(s): INR, APTT in the last 8760 hours.  BMP: Recent Labs    03/18/20 0330 03/16/2020 0343 03/20/20 0406 03/21/20 0455  NA 137 138 136 139  K 3.2* 3.2* 4.3 4.4  CL 107 107 104 109  CO2 23 23 23  18*  GLUCOSE 118* 105* 163* 140*  BUN 7* <5* 8 21  CALCIUM 7.9* 7.8* 7.9* 7.8*  CREATININE 0.80 0.72 0.96 2.00*  GFRNONAA >60 >60 >60 35*    LIVER FUNCTION TESTS: Recent Labs    03/18/20 0330 03/04/2020 0343 03/20/20 0406 03/21/20 0455  BILITOT 1.7* 1.4* 1.8* 1.9*  AST 18 16 274* 104*  ALT 38 29 249* 185*  ALKPHOS 102 92 100 89  PROT 5.5* 5.1* 5.9* 5.5*  ALBUMIN 2.2* 2.1* 2.4* 2.2*    TUMOR MARKERS: No results for input(s): AFPTM, CEA, CA199, CHROMGRNA in the last 8760 hours.  Assessment and Plan: S/p recent CBD stent and lap chole GB fossa and perihepatic fluid collections concerning for biloma Plan for image guided perc drain placement today Labs reviewed. Discussed with wife at bedside and pt sister via phone. Risks and benefits discussed with the patient including bleeding, infection, damage to adjacent structures,  bowel perforation/fistula connection, and sepsis.  All of the patient's questions were answered, patient is agreeable to proceed. Consent signed and in chart.    Thank you for this interesting consult.  I greatly enjoyed meeting Jacelyn PiRaymond N Esh Jr. and look forward to participating in their care.  A copy of this report was sent to the requesting provider on this date.  Electronically Signed: Brayton ElKevin Montrelle Eddings, PA-C 03/21/2020, 9:08 AM   I spent a total of 30 minutes in face to face in clinical consultation, greater than 50% of which was counseling/coordinating care for perihepatic fluid drain

## 2020-03-21 NOTE — Progress Notes (Signed)
Patient left unit for procedural area.

## 2020-03-21 NOTE — Progress Notes (Signed)
PT Cancellation Note  Patient Details Name: Jorge Evans. MRN: [id number] DOB: 06/17/1950   Cancelled Treatment:    Reason Eval/Treat Not Completed: Patient not medically ready. RN reports patient has too much going on right now. Hold PT evaluation at this time. Will re-attempt tomorrow.    Posey Jasmin 03/21/2020, 1:55 PM

## 2020-03-21 NOTE — Progress Notes (Signed)
Subjective: Feeling better with pain medications.  Objective: Vital signs in last 24 hours: Temp:  [97.7 F (36.5 C)-99.2 F (37.3 C)] 97.7 F (36.5 C) (12/18 1210) Pulse Rate:  [82-108] 82 (12/18 1210) Resp:  [12-31] 16 (12/18 1210) BP: (101-147)/(52-85) 138/52 (12/18 1210) SpO2:  [92 %-100 %] 94 % (12/18 1210) Last BM Date: 03/18/20  Intake/Output from previous day: 12/17 0701 - 12/18 0700 In: 3005.2 [P.O.:60; I.V.:2872.8; IV Piggyback:72.4] Out: 540 [Urine:540] Intake/Output this shift: Total I/O In: 512.6 [I.V.:484.9; IV Piggyback:27.7] Out: 925 [Drains:925]  General appearance: sleepy GI: tender at the incision sites  Lab Results: Recent Labs    03/18/2020 1543 03/20/20 0406 03/21/20 0455  WBC 23.1* 19.1* 20.0*  HGB 13.2 13.1 12.8*  HCT 40.0 37.5* 39.6  PLT 235 259 254   BMET Recent Labs    03/17/2020 0343 03/20/20 0406 03/21/20 0455  NA 138 136 139  K 3.2* 4.3 4.4  CL 107 104 109  CO2 23 23 18*  GLUCOSE 105* 163* 140*  BUN <5* 8 21  CREATININE 0.72 0.96 2.00*  CALCIUM 7.8* 7.9* 7.8*   LFT Recent Labs    03/21/20 0455  PROT 5.5*  ALBUMIN 2.2*  AST 104*  ALT 185*  ALKPHOS 89  BILITOT 1.9*   PT/INR No results for input(s): LABPROT, INR in the last 72 hours. Hepatitis Panel No results for input(s): HEPBSAG, HCVAB, HEPAIGM, HEPBIGM in the last 72 hours. C-Diff No results for input(s): CDIFFTOX in the last 72 hours. Fecal Lactopherrin No results for input(s): FECLLACTOFRN in the last 72 hours.  Studies/Results: CT ABDOMEN PELVIS W CONTRAST  Result Date: 03/20/2020 CLINICAL DATA:  Umbilical pain after laparoscopic cholecystectomy yesterday. EXAM: CT ABDOMEN AND PELVIS WITH CONTRAST TECHNIQUE: Multidetector CT imaging of the abdomen and pelvis was performed using the standard protocol following bolus administration of intravenous contrast. CONTRAST:  OMNIPAQUE IOHEXOL 300 MG/ML  SOLN COMPARISON:  CT 03/23/2020 FINDINGS: Lower chest: Small  right and trace left pleural effusion. Associated compressive atelectasis. There are coronary artery calcifications. Fluid distends the distal esophagus. Hepatobiliary: Interval cholecystectomy with clips in the gallbladder fossa. Persistent but diminished biliary dilatation in the left lobe of the liver with new pneumobilia, likely sequela of ERCP. Plastic common bile duct stent. Common bile duct proximal to the stent measures 18 mm. Rounded density in the common bile duct measuring 9 mm may represent a retained stone at the proximal aspect of the stent, series 6, image 77. No focal hepatic lesion. Moderate volume perihepatic free fluid containing foci of air with non organized fluid and air in the gallbladder fossa. Pancreas: Parenchymal atrophy. No ductal dilatation or inflammation. Spleen: Normal in size without focal abnormality. Adrenals/Urinary Tract: No adrenal nodule. No hydronephrosis or perinephric edema. Homogeneous renal enhancement . Absent renal excretion on delayed phase imaging. Slight bladder wall thickening and questionable bladder diverticulum at the dome, unchanged. Stomach/Bowel: Fluid distends the distal esophagus. Surgical clips at the gastroesophageal junction. Stomach is decompressed. Prior gastric surgery with suspected gastrojejunostomy. Decompressed small bowel without wall thickening or obstruction. Normal appendix. Small to moderate colonic stool burden without colonic inflammation. Mild distal diverticulosis without diverticulitis. Vascular/Lymphatic: Aortic and branch atherosclerosis. No aortic aneurysm. No portal vein thrombosis. No abdominopelvic adenopathy. Reproductive: Prostate is unremarkable. Other: Moderate volume of perihepatic fluid which measures simple fluid density. Multiple foci of internal air. Ill-defined air fluid collection in the gallbladder fossa without peripherally enhancing collection. Occasional foci of free air in the upper abdomen not unexpected post recent  surgery. Scattered air in the subcutaneous tissues from recent laparoscopy. Trace free fluid tracks into the pelvis. Musculoskeletal: S shaped scoliosis and multilevel degenerative change in the spine. Chronic dysplastic deformity of the left acetabulum and proximal femur. There are no acute or suspicious osseous abnormalities. IMPRESSION: 1. Interval cholecystectomy. Moderate volume of perihepatic free fluid containing foci of air with non organized fluid and air in the gallbladder fossa. Given the amount of fluid, biloma is favored over postoperative seroma. Sterility is indeterminate by imaging. Consider nuclear medicine hepatic biliary scan to assess for bile leak. 2. Plastic common bile duct stent in place. Suspected residual 9 mm stone at the proximal aspect of the stent. Common bile duct dilatation of 16 mm, with improved but persistent intrahepatic biliary dilatation on the left. Pneumobilia related to recent procedure. 3. Small right and trace left pleural effusions. 4. Absent renal excretion on delayed phase imaging, can be seen with renal dysfunction. 5. Mild distal colonic diverticulosis without diverticulitis. Aortic Atherosclerosis (ICD10-I70.0). Electronically Signed   By: Narda Rutherford M.D.   On: 03/20/2020 23:11   CT IMAGE GUIDED FLUID DRAIN BY CATHETER  Result Date: 03/21/2020 CLINICAL DATA:  Status post cholecystectomy on 03/05/2020 with development of significant perihepatic fluid suspicious for bile leak. EXAM: CT GUIDED CATHETER DRAINAGE OF PERIHEPATIC ABSCESS ANESTHESIA/SEDATION: 1.5 mg IV Versed 75 mcg IV Fentanyl Total Moderate Sedation Time:  13 minutes The patient's level of consciousness and physiologic status were continuously monitored during the procedure by Radiology nursing. PROCEDURE: The procedure, risks, benefits, and alternatives were explained to the patient. Questions regarding the procedure were encouraged and answered. The patient understands and consents to the  procedure. A time out was performed prior to initiating the procedure. CT was performed through the upper abdomen in a supine position. The right abdominal wall was prepped with chlorhexidine in a sterile fashion, and a sterile drape was applied covering the operative field. A sterile gown and sterile gloves were used for the procedure. Local anesthesia was provided with 1% Lidocaine. Under CT guidance, a 18 gauge trocar needle was advanced into the lateral perihepatic space. After return of fluid, a guidewire was advanced, tract dilatation performed over the wire and a 12 French percutaneous drainage catheter advanced over the wire. Drainage catheter position was confirmed by CT. A fluid sample was withdrawn and sent for culture analysis. The drain was connected to a suction bulb. The bulb was emptied multiple times during the procedure. The drain was secured at the skin with a Prolene retention suture and StatLock device. COMPLICATIONS: None FINDINGS: Contiguous fluid is present beginning in the subdiaphragmatic space and continuing in the lateral perihepatic space. There is a relatively small amount of fluid in the gallbladder fossa. There is a small amount of gas in the perihepatic fluid. After needle puncture and drain placement, there is return of dark, bilious fluid. After drain placement, there is rapid drainage of bilious fluid and several hundred mL of fluid was removed immediately after drain placement. IMPRESSION: CT-guided percutaneous catheter drainage of perihepatic fluid collection yielding dark, bilious fluid. A fluid sample was sent for culture analysis. A 12 French drain was placed in the lateral perihepatic space and attached to suction bulb drainage with rapid removal of bilious fluid. Electronically Signed   By: Irish Lack M.D.   On: 03/21/2020 11:07    Medications:  Scheduled: . acetaminophen  1,000 mg Oral Q8H  . fentaNYL      . lidocaine  1 patch Transdermal Q24H  .  lidocaine       . lidocaine  1 application Urethral Once  . midazolam      . mupirocin ointment  1 application Nasal BID  . pantoprazole (PROTONIX) IV  40 mg Intravenous Q24H  . sucralfate  1 g Oral TID WC & HS   Continuous: . lactated ringers 125 mL/hr at 03/21/20 0911  . methocarbamol (ROBAXIN) IV 500 mg (04/02/2020 2242)  . methocarbamol (ROBAXIN) IV 500 mg (03/21/20 1316)  . piperacillin-tazobactam (ZOSYN)  IV 3.375 g (03/21/20 0920)    Assessment/Plan: 1) Bile leak s/p lap chole. 2) Choledocholithiasis s/p stent placement.   The drain is draining bilious fluid.  He is feeling better.  I had a long conversation with the patient's wife.  All of her questions and concerns were discussed in great detail using visualization of images of normal anatomy and the Billroth II anatomy.  She was pleasant during the whole discussion and she now has a better understanding of the technical difficulties with the ERCP in the setting of a Billroth II.  Plans are being made for a potential transfer, however, the ERCP may be performed by Dr. Meridee Score if he has availability.  This is if the patient is still pending transfer.  However, if the patient starts to feel better with the IR biliary drain as well as the internalized plastic biliary stent, he may be able to be discharged home and return as an outpatient.  Plan: 1) Continue with pain control. 2) Follow liver panel. 3) Monitor IR biliary drain.  LOS: 8 days   Sona Nations D 03/21/2020, 1:35 PM

## 2020-03-21 NOTE — Progress Notes (Addendum)
   03/21/20 1612  Mobility  Activity Ambulated in room;Transferred:  Bed to chair  Range of Motion/Exercises Active;All extremities  Level of Assistance Minimal assist, patient does 75% or more  Minutes Ambulated 8 minutes  Distance Ambulated (ft) 10 ft  Mobility performed by Nurse;Nurse tech    Patient appears to be more alert and awake, he denied any pain or spasm. He does voices frustration. Up in chair at this time. Family is not present at this time. Will continue to monitor.

## 2020-03-21 NOTE — Progress Notes (Signed)
Attempting to arrange transfer: [facility]: not taking patient's out of network [facility]: awaiting call back

## 2020-03-21 NOTE — Progress Notes (Signed)
Patient returned from procedure. VSS. Patient denied pain, appears comfortable. Wife at bedside with PA at this time. TELE applied and confirmed with TELE Tech.

## 2020-03-21 NOTE — Progress Notes (Signed)
Progress Note    Jorge Pi.  DDU:[id number] DOB: 05-25-1950  DOA: 03/17/2020 PCP: Salley Scarlet, MD    Brief Narrative:     Medical records reviewed and are as summarized below:  Jorge Pi. is an 69 y.o. male with past medical history significant for GERD who presented with progressive abdominal pain.  Noted to have elevated transaminitis and total bilirubin.  CT abdomen/pelvis with IV contrast with intra-/extrahepatic biliary duct dilation with filling defects within the common duct concerning for choledocholithiasis.  Underwent ERCP on 03-Apr-2020, 3 large common bile duct stones noted, unable to perform a sphincterotomy and stone extraction due to anatomy s/p Billroth II; and had stent placement. 12/16: cholecystectomy.  Drain placed for bile leak on 12/18.    Assessment/Plan:   Principal Problem:   Choledocholithiasis Active Problems:   GERD (gastroesophageal reflux disease)   Iron deficiency anemia   Elevated LFTs   Choledocholithiasis --Gastroenterology, Dr. Louie Casa  --Zosyn discontinued by GI on 12/12 --Gen Surgery recommends repeat ERCP and transfer to tertiary center vs surgical management of choledocholithiasis: per Dr. [place]: Received return call from [facility], GI service with Dr. Hildred Laser and Dr. Jamse Mead; no indication for transfer at this time given improving LFTs with stent adequately placed. They recommended surgical consultation if concern for cholecystitis, which General Surgery is currently consulted for here at [facility]. [facility] GI service would be happy to see the patient for outpatient consultation in the few weeks if needed. --s/p cholecystectomy on 12/16 --Pain control and antiemetics --Continue to follow LFTs and CBC daily- elevated LFTs on 12/17 may be from procedure -ambulate - I called and spoke with Dr. Ilsa Iha (GS at [facility]): He reviewed the notes and imaging sent.  He agrees with all that has been done thus far-  stent and drain.  He is not sure what else he can offer at this time.  If situation changes such as >500 out the drain/day we can call back   hypokalemia -repleted  GERD --Carafate 1 g 4 times daily --Protonix 40 mg p.o. daily  Iron deficiency anemia -ferrous sulfate 325 mg daily at home.  Hiccups -resolved post surgery   Family Communication/Anticipated D/C date and plan/Code Status   DVT prophylaxis: Lovenox ordered. Code Status: Full Code.  Disposition Plan: Status is: Inpatient  Remains inpatient appropriate because:Inpatient level of care appropriate due to severity of illness   Dispo:  Patient From: Home  Planned Disposition: Home  Expected discharge date: 03/05/2020  Medically stable for discharge: No          Medical Consultants:    GI  GS  Subjective:   Having spasms in abdomen  Objective:    Vitals:   03/21/20 1025 03/21/20 1045 03/21/20 1058 03/21/20 1210  BP: 116/68  (!) 129/52 (!) 138/52  Pulse: 97 96 96 82  Resp:  20 20 16   Temp:   98.8 F (37.1 C) 97.7 F (36.5 C)  TempSrc:   Oral Oral  SpO2: 99% 99% 98% 94%  Weight:      Height:        Intake/Output Summary (Last 24 hours) at 03/21/2020 1338 Last data filed at 03/21/2020 1309 Gross per 24 hour  Intake 3457.75 ml  Output 1145 ml  Net 2312.75 ml   Filed Weights   03/29/2020 1442  Weight: 69.9 kg    Exam:   General: Appearance:    Well developed, well nourished male who appears uncomfortable   +  BS, tender to palpation  Lungs:      respirations unlabored  Heart:    Normal heart rate. Normal rhythm. No murmurs, rubs, or gallops.   MS:   All extremities are intact.   Neurologic:   Awake, alert       Data Reviewed:   I have personally reviewed following labs and imaging studies:  Labs: Labs show the following:   Basic Metabolic Panel: Recent Labs  Lab 03/17/20 0323 03/18/20 0330 03/08/2020 0343 03/20/20 0406 03/21/20 0455  NA 134* 137 138 136 139  K  3.8 3.2* 3.2* 4.3 4.4  CL 105 107 107 104 109  CO2 17* 23 23 23  18*  GLUCOSE 59* 118* 105* 163* 140*  BUN 14 7* <5* 8 21  CREATININE 0.87 0.80 0.72 0.96 2.00*  CALCIUM 8.1* 7.9* 7.8* 7.9* 7.8*   GFR Estimated Creatinine Clearance: 31 mL/min (A) (by C-G formula based on SCr of 2 mg/dL (H)). Liver Function Tests: Recent Labs  Lab 03/17/20 0323 03/18/20 0330 03/18/2020 0343 03/20/20 0406 03/21/20 0455  AST 22 18 16  274* 104*  ALT 52* 38 29 249* 185*  ALKPHOS 121 102 92 100 89  BILITOT 2.6* 1.7* 1.4* 1.8* 1.9*  PROT 5.8* 5.5* 5.1* 5.9* 5.5*  ALBUMIN 2.5* 2.2* 2.1* 2.4* 2.2*   No results for input(s): LIPASE, AMYLASE in the last 168 hours. No results for input(s): AMMONIA in the last 168 hours. Coagulation profile No results for input(s): INR, PROTIME in the last 168 hours.  CBC: Recent Labs  Lab 03/18/20 0330 03/29/2020 0343 03/29/2020 1543 03/20/20 0406 03/21/20 0455  WBC 12.4* 9.9 23.1* 19.1* 20.0*  HGB 13.4 12.9* 13.2 13.1 12.8*  HCT 39.9 36.6* 40.0 37.5* 39.6  MCV 86.0 85.1 87.1 86.2 89.0  PLT 156 159 235 259 254   Cardiac Enzymes: No results for input(s): CKTOTAL, CKMB, CKMBINDEX, TROPONINI in the last 168 hours. BNP (last 3 results) No results for input(s): PROBNP in the last 8760 hours. CBG: No results for input(s): GLUCAP in the last 168 hours. D-Dimer: No results for input(s): DDIMER in the last 72 hours. Hgb A1c: No results for input(s): HGBA1C in the last 72 hours. Lipid Profile: No results for input(s): CHOL, HDL, LDLCALC, TRIG, CHOLHDL, LDLDIRECT in the last 72 hours. Thyroid function studies: No results for input(s): TSH, T4TOTAL, T3FREE, THYROIDAB in the last 72 hours.  Invalid input(s): FREET3 Anemia work up: No results for input(s): VITAMINB12, FOLATE, FERRITIN, TIBC, IRON, RETICCTPCT in the last 72 hours. Sepsis Labs: Recent Labs  Lab 03/17/2020 0343 03/31/2020 1543 03/20/20 0406 03/21/20 0455  WBC 9.9 23.1* 19.1* 20.0*     Microbiology Recent Results (from the past 240 hour(s))  Resp Panel by RT-PCR (Flu A&B, Covid) Nasopharyngeal Swab     Status: None   Collection Time: 03/14/2020 12:30 AM   Specimen: Nasopharyngeal Swab; Nasopharyngeal(NP) swabs in vial transport medium  Result Value Ref Range Status   SARS Coronavirus 2 by RT PCR NEGATIVE NEGATIVE Final    Comment: (NOTE) SARS-CoV-2 target nucleic acids are NOT DETECTED.  The SARS-CoV-2 RNA is generally detectable in upper respiratory specimens during the acute phase of infection. The lowest concentration of SARS-CoV-2 viral copies this assay can detect is 138 copies/mL. A negative result does not preclude SARS-Cov-2 infection and should not be used as the sole basis for treatment or other patient management decisions. A negative result may occur with  improper specimen collection/handling, submission of specimen other than nasopharyngeal swab, presence of viral  mutation(s) within the areas targeted by this assay, and inadequate number of viral copies(<138 copies/mL). A negative result must be combined with clinical observations, patient history, and epidemiological information. The expected result is Negative.  Fact Sheet for Patients:  BloggerCourse.com  Fact Sheet for Healthcare Providers:  SeriousBroker.it  This test is no t yet approved or cleared by the [place] FDA and  has been authorized for detection and/or diagnosis of SARS-CoV-2 by FDA under an Emergency Use Authorization (EUA). This EUA will remain  in effect (meaning this test can be used) for the duration of the COVID-19 declaration under Section 564(b)(1) of the Act, 21 U.S.C.section 360bbb-3(b)(1), unless the authorization is terminated  or revoked sooner.       Influenza A by PCR NEGATIVE NEGATIVE Final   Influenza B by PCR NEGATIVE NEGATIVE Final    Comment: (NOTE) The Xpert Xpress SARS-CoV-2/FLU/RSV plus assay is  intended as an aid in the diagnosis of influenza from Nasopharyngeal swab specimens and should not be used as a sole basis for treatment. Nasal washings and aspirates are unacceptable for Xpert Xpress SARS-CoV-2/FLU/RSV testing.  Fact Sheet for Patients: BloggerCourse.com  Fact Sheet for Healthcare Providers: SeriousBroker.it  This test is not yet approved or cleared by the [place] FDA and has been authorized for detection and/or diagnosis of SARS-CoV-2 by FDA under an Emergency Use Authorization (EUA). This EUA will remain in effect (meaning this test can be used) for the duration of the COVID-19 declaration under Section 564(b)(1) of the Act, 21 U.S.C. section 360bbb-3(b)(1), unless the authorization is terminated or revoked.  Performed at [facility], [address], [place], [place] [address]     Procedures and diagnostic studies:  CT ABDOMEN PELVIS W CONTRAST  Result Date: 03/20/2020 CLINICAL DATA:  Umbilical pain after laparoscopic cholecystectomy yesterday. EXAM: CT ABDOMEN AND PELVIS WITH CONTRAST TECHNIQUE: Multidetector CT imaging of the abdomen and pelvis was performed using the standard protocol following bolus administration of intravenous contrast. CONTRAST:  OMNIPAQUE IOHEXOL 300 MG/ML  SOLN COMPARISON:  CT 03/04/2020 FINDINGS: Lower chest: Small right and trace left pleural effusion. Associated compressive atelectasis. There are coronary artery calcifications. Fluid distends the distal esophagus. Hepatobiliary: Interval cholecystectomy with clips in the gallbladder fossa. Persistent but diminished biliary dilatation in the left lobe of the liver with new pneumobilia, likely sequela of ERCP. Plastic common bile duct stent. Common bile duct proximal to the stent measures 18 mm. Rounded density in the common bile duct measuring 9 mm may represent a retained stone at the proximal aspect of the stent, series  6, image 77. No focal hepatic lesion. Moderate volume perihepatic free fluid containing foci of air with non organized fluid and air in the gallbladder fossa. Pancreas: Parenchymal atrophy. No ductal dilatation or inflammation. Spleen: Normal in size without focal abnormality. Adrenals/Urinary Tract: No adrenal nodule. No hydronephrosis or perinephric edema. Homogeneous renal enhancement . Absent renal excretion on delayed phase imaging. Slight bladder wall thickening and questionable bladder diverticulum at the dome, unchanged. Stomach/Bowel: Fluid distends the distal esophagus. Surgical clips at the gastroesophageal junction. Stomach is decompressed. Prior gastric surgery with suspected gastrojejunostomy. Decompressed small bowel without wall thickening or obstruction. Normal appendix. Small to moderate colonic stool burden without colonic inflammation. Mild distal diverticulosis without diverticulitis. Vascular/Lymphatic: Aortic and branch atherosclerosis. No aortic aneurysm. No portal vein thrombosis. No abdominopelvic adenopathy. Reproductive: Prostate is unremarkable. Other: Moderate volume of perihepatic fluid which measures simple fluid density. Multiple foci of internal air. Ill-defined air  fluid collection in the gallbladder fossa without peripherally enhancing collection. Occasional foci of free air in the upper abdomen not unexpected post recent surgery. Scattered air in the subcutaneous tissues from recent laparoscopy. Trace free fluid tracks into the pelvis. Musculoskeletal: S shaped scoliosis and multilevel degenerative change in the spine. Chronic dysplastic deformity of the left acetabulum and proximal femur. There are no acute or suspicious osseous abnormalities. IMPRESSION: 1. Interval cholecystectomy. Moderate volume of perihepatic free fluid containing foci of air with non organized fluid and air in the gallbladder fossa. Given the amount of fluid, biloma is favored over postoperative seroma.  Sterility is indeterminate by imaging. Consider nuclear medicine hepatic biliary scan to assess for bile leak. 2. Plastic common bile duct stent in place. Suspected residual 9 mm stone at the proximal aspect of the stent. Common bile duct dilatation of 16 mm, with improved but persistent intrahepatic biliary dilatation on the left. Pneumobilia related to recent procedure. 3. Small right and trace left pleural effusions. 4. Absent renal excretion on delayed phase imaging, can be seen with renal dysfunction. 5. Mild distal colonic diverticulosis without diverticulitis. Aortic Atherosclerosis (ICD10-I70.0). Electronically Signed   By: Narda Rutherford M.D.   On: 03/20/2020 23:11   CT IMAGE GUIDED FLUID DRAIN BY CATHETER  Result Date: 03/21/2020 CLINICAL DATA:  Status post cholecystectomy on 03/06/2020 with development of significant perihepatic fluid suspicious for bile leak. EXAM: CT GUIDED CATHETER DRAINAGE OF PERIHEPATIC ABSCESS ANESTHESIA/SEDATION: 1.5 mg IV Versed 75 mcg IV Fentanyl Total Moderate Sedation Time:  13 minutes The patient's level of consciousness and physiologic status were continuously monitored during the procedure by Radiology nursing. PROCEDURE: The procedure, risks, benefits, and alternatives were explained to the patient. Questions regarding the procedure were encouraged and answered. The patient understands and consents to the procedure. A time out was performed prior to initiating the procedure. CT was performed through the upper abdomen in a supine position. The right abdominal wall was prepped with chlorhexidine in a sterile fashion, and a sterile drape was applied covering the operative field. A sterile gown and sterile gloves were used for the procedure. Local anesthesia was provided with 1% Lidocaine. Under CT guidance, a 18 gauge trocar needle was advanced into the lateral perihepatic space. After return of fluid, a guidewire was advanced, tract dilatation performed over the wire  and a 12 French percutaneous drainage catheter advanced over the wire. Drainage catheter position was confirmed by CT. A fluid sample was withdrawn and sent for culture analysis. The drain was connected to a suction bulb. The bulb was emptied multiple times during the procedure. The drain was secured at the skin with a Prolene retention suture and StatLock device. COMPLICATIONS: None FINDINGS: Contiguous fluid is present beginning in the subdiaphragmatic space and continuing in the lateral perihepatic space. There is a relatively small amount of fluid in the gallbladder fossa. There is a small amount of gas in the perihepatic fluid. After needle puncture and drain placement, there is return of dark, bilious fluid. After drain placement, there is rapid drainage of bilious fluid and several hundred mL of fluid was removed immediately after drain placement. IMPRESSION: CT-guided percutaneous catheter drainage of perihepatic fluid collection yielding dark, bilious fluid. A fluid sample was sent for culture analysis. A 12 French drain was placed in the lateral perihepatic space and attached to suction bulb drainage with rapid removal of bilious fluid. Electronically Signed   By: Irish Lack M.D.   On: 03/21/2020 11:07    Medications:   .  acetaminophen  1,000 mg Oral Q8H  . fentaNYL      . lidocaine  1 patch Transdermal Q24H  . lidocaine      . lidocaine  1 application Urethral Once  . midazolam      . mupirocin ointment  1 application Nasal BID  . pantoprazole (PROTONIX) IV  40 mg Intravenous Q24H  . sucralfate  1 g Oral TID WC & HS   Continuous Infusions: . lactated ringers 125 mL/hr at 03/21/20 0911  . methocarbamol (ROBAXIN) IV 500 mg (03/14/2020 2242)  . methocarbamol (ROBAXIN) IV 500 mg (03/21/20 1316)  . piperacillin-tazobactam (ZOSYN)  IV 3.375 g (03/21/20 0920)     LOS: 8 days   Joseph ArtJessica U Zafirah Vanzee  [facility]   How to contact the [facility] Attending or Consulting provider 7A - 7P or  covering provider during after hours 7P -7A, for this patient?  1. Check the care team in [facility] and look for a) attending/consulting [facility] provider listed and b) the [facility] team listed 2. Log into [email or web address] and use [facility] universal password to access. If you do not have the password, please contact the hospital operator. 3. Locate the [facility] provider you are looking for under [facility] and page to a number that you can be directly reached. 4. If you still have difficulty reaching the provider, please page the [facility] (Director on Call) for the Hospitalists listed on amion for assistance.  03/21/2020, 1:38 PM

## 2020-03-21 NOTE — H&P (View-Only) (Signed)
Subjective: Feeling better with pain medications.  Objective: Vital signs in last 24 hours: Temp:  [97.7 F (36.5 C)-99.2 F (37.3 C)] 97.7 F (36.5 C) (12/18 1210) Pulse Rate:  [82-108] 82 (12/18 1210) Resp:  [12-31] 16 (12/18 1210) BP: (101-147)/(52-85) 138/52 (12/18 1210) SpO2:  [92 %-100 %] 94 % (12/18 1210) Last BM Date: 03/18/20  Intake/Output from previous day: 12/17 0701 - 12/18 0700 In: 3005.2 [P.O.:60; I.V.:2872.8; IV Piggyback:72.4] Out: 540 [Urine:540] Intake/Output this shift: Total I/O In: 512.6 [I.V.:484.9; IV Piggyback:27.7] Out: 925 [Drains:925]  General appearance: sleepy GI: tender at the incision sites  Lab Results: Recent Labs    03/18/2020 1543 03/20/20 0406 03/21/20 0455  WBC 23.1* 19.1* 20.0*  HGB 13.2 13.1 12.8*  HCT 40.0 37.5* 39.6  PLT 235 259 254   BMET Recent Labs    03/17/2020 0343 03/20/20 0406 03/21/20 0455  NA 138 136 139  K 3.2* 4.3 4.4  CL 107 104 109  CO2 23 23 18*  GLUCOSE 105* 163* 140*  BUN <5* 8 21  CREATININE 0.72 0.96 2.00*  CALCIUM 7.8* 7.9* 7.8*   LFT Recent Labs    03/21/20 0455  PROT 5.5*  ALBUMIN 2.2*  AST 104*  ALT 185*  ALKPHOS 89  BILITOT 1.9*   PT/INR No results for input(s): LABPROT, INR in the last 72 hours. Hepatitis Panel No results for input(s): HEPBSAG, HCVAB, HEPAIGM, HEPBIGM in the last 72 hours. C-Diff No results for input(s): CDIFFTOX in the last 72 hours. Fecal Lactopherrin No results for input(s): FECLLACTOFRN in the last 72 hours.  Studies/Results: CT ABDOMEN PELVIS W CONTRAST  Result Date: 03/20/2020 CLINICAL DATA:  Umbilical pain after laparoscopic cholecystectomy yesterday. EXAM: CT ABDOMEN AND PELVIS WITH CONTRAST TECHNIQUE: Multidetector CT imaging of the abdomen and pelvis was performed using the standard protocol following bolus administration of intravenous contrast. CONTRAST:  OMNIPAQUE IOHEXOL 300 MG/ML  SOLN COMPARISON:  CT 03/23/2020 FINDINGS: Lower chest: Small  right and trace left pleural effusion. Associated compressive atelectasis. There are coronary artery calcifications. Fluid distends the distal esophagus. Hepatobiliary: Interval cholecystectomy with clips in the gallbladder fossa. Persistent but diminished biliary dilatation in the left lobe of the liver with new pneumobilia, likely sequela of ERCP. Plastic common bile duct stent. Common bile duct proximal to the stent measures 18 mm. Rounded density in the common bile duct measuring 9 mm may represent a retained stone at the proximal aspect of the stent, series 6, image 77. No focal hepatic lesion. Moderate volume perihepatic free fluid containing foci of air with non organized fluid and air in the gallbladder fossa. Pancreas: Parenchymal atrophy. No ductal dilatation or inflammation. Spleen: Normal in size without focal abnormality. Adrenals/Urinary Tract: No adrenal nodule. No hydronephrosis or perinephric edema. Homogeneous renal enhancement . Absent renal excretion on delayed phase imaging. Slight bladder wall thickening and questionable bladder diverticulum at the dome, unchanged. Stomach/Bowel: Fluid distends the distal esophagus. Surgical clips at the gastroesophageal junction. Stomach is decompressed. Prior gastric surgery with suspected gastrojejunostomy. Decompressed small bowel without wall thickening or obstruction. Normal appendix. Small to moderate colonic stool burden without colonic inflammation. Mild distal diverticulosis without diverticulitis. Vascular/Lymphatic: Aortic and branch atherosclerosis. No aortic aneurysm. No portal vein thrombosis. No abdominopelvic adenopathy. Reproductive: Prostate is unremarkable. Other: Moderate volume of perihepatic fluid which measures simple fluid density. Multiple foci of internal air. Ill-defined air fluid collection in the gallbladder fossa without peripherally enhancing collection. Occasional foci of free air in the upper abdomen not unexpected post recent  surgery. Scattered air in the subcutaneous tissues from recent laparoscopy. Trace free fluid tracks into the pelvis. Musculoskeletal: S shaped scoliosis and multilevel degenerative change in the spine. Chronic dysplastic deformity of the left acetabulum and proximal femur. There are no acute or suspicious osseous abnormalities. IMPRESSION: 1. Interval cholecystectomy. Moderate volume of perihepatic free fluid containing foci of air with non organized fluid and air in the gallbladder fossa. Given the amount of fluid, biloma is favored over postoperative seroma. Sterility is indeterminate by imaging. Consider nuclear medicine hepatic biliary scan to assess for bile leak. 2. Plastic common bile duct stent in place. Suspected residual 9 mm stone at the proximal aspect of the stent. Common bile duct dilatation of 16 mm, with improved but persistent intrahepatic biliary dilatation on the left. Pneumobilia related to recent procedure. 3. Small right and trace left pleural effusions. 4. Absent renal excretion on delayed phase imaging, can be seen with renal dysfunction. 5. Mild distal colonic diverticulosis without diverticulitis. Aortic Atherosclerosis (ICD10-I70.0). Electronically Signed   By: Narda Rutherford M.D.   On: 03/20/2020 23:11   CT IMAGE GUIDED FLUID DRAIN BY CATHETER  Result Date: 03/21/2020 CLINICAL DATA:  Status post cholecystectomy on 03/18/2020 with development of significant perihepatic fluid suspicious for bile leak. EXAM: CT GUIDED CATHETER DRAINAGE OF PERIHEPATIC ABSCESS ANESTHESIA/SEDATION: 1.5 mg IV Versed 75 mcg IV Fentanyl Total Moderate Sedation Time:  13 minutes The patient's level of consciousness and physiologic status were continuously monitored during the procedure by Radiology nursing. PROCEDURE: The procedure, risks, benefits, and alternatives were explained to the patient. Questions regarding the procedure were encouraged and answered. The patient understands and consents to the  procedure. A time out was performed prior to initiating the procedure. CT was performed through the upper abdomen in a supine position. The right abdominal wall was prepped with chlorhexidine in a sterile fashion, and a sterile drape was applied covering the operative field. A sterile gown and sterile gloves were used for the procedure. Local anesthesia was provided with 1% Lidocaine. Under CT guidance, a 18 gauge trocar needle was advanced into the lateral perihepatic space. After return of fluid, a guidewire was advanced, tract dilatation performed over the wire and a 12 French percutaneous drainage catheter advanced over the wire. Drainage catheter position was confirmed by CT. A fluid sample was withdrawn and sent for culture analysis. The drain was connected to a suction bulb. The bulb was emptied multiple times during the procedure. The drain was secured at the skin with a Prolene retention suture and StatLock device. COMPLICATIONS: None FINDINGS: Contiguous fluid is present beginning in the subdiaphragmatic space and continuing in the lateral perihepatic space. There is a relatively small amount of fluid in the gallbladder fossa. There is a small amount of gas in the perihepatic fluid. After needle puncture and drain placement, there is return of dark, bilious fluid. After drain placement, there is rapid drainage of bilious fluid and several hundred mL of fluid was removed immediately after drain placement. IMPRESSION: CT-guided percutaneous catheter drainage of perihepatic fluid collection yielding dark, bilious fluid. A fluid sample was sent for culture analysis. A 12 French drain was placed in the lateral perihepatic space and attached to suction bulb drainage with rapid removal of bilious fluid. Electronically Signed   By: Irish Lack M.D.   On: 03/21/2020 11:07    Medications:  Scheduled: . acetaminophen  1,000 mg Oral Q8H  . fentaNYL      . lidocaine  1 patch Transdermal Q24H  .  lidocaine       . lidocaine  1 application Urethral Once  . midazolam      . mupirocin ointment  1 application Nasal BID  . pantoprazole (PROTONIX) IV  40 mg Intravenous Q24H  . sucralfate  1 g Oral TID WC & HS   Continuous: . lactated ringers 125 mL/hr at 03/21/20 0911  . methocarbamol (ROBAXIN) IV 500 mg (04/02/2020 2242)  . methocarbamol (ROBAXIN) IV 500 mg (03/21/20 1316)  . piperacillin-tazobactam (ZOSYN)  IV 3.375 g (03/21/20 0920)    Assessment/Plan: 1) Bile leak s/p lap chole. 2) Choledocholithiasis s/p stent placement.   The drain is draining bilious fluid.  He is feeling better.  I had a long conversation with the patient's wife.  All of her questions and concerns were discussed in great detail using visualization of images of normal anatomy and the Billroth II anatomy.  She was pleasant during the whole discussion and she now has a better understanding of the technical difficulties with the ERCP in the setting of a Billroth II.  Plans are being made for a potential transfer, however, the ERCP may be performed by Dr. Meridee Score if he has availability.  This is if the patient is still pending transfer.  However, if the patient starts to feel better with the IR biliary drain as well as the internalized plastic biliary stent, he may be able to be discharged home and return as an outpatient.  Plan: 1) Continue with pain control. 2) Follow liver panel. 3) Monitor IR biliary drain.  LOS: 8 days   Essa Wenk D 03/21/2020, 1:35 PM

## 2020-03-21 NOTE — Procedures (Signed)
Interventional Radiology Procedure Note  Procedure: CT Guided Drainage of perihepatic fluid collection  Complications: None  Estimated Blood Loss: < 10 mL  Findings: 12 Fr drain placed in right perihepatic fluid collection with return of dark, bilious fluid. Fluid sample sent for culture analysis. Drain attached to suction bulb drainage.  Will follow.  Jodi Marble. Fredia Sorrow, M.D Pager:  [phone]

## 2020-03-21 NOTE — Progress Notes (Signed)
Patient continue to c/o spasm on left lower abdomen. Robaxin IVP admin. Wife at bedside.   Bladder scanned of , patient able to stand at bedside and voided concentrated amber urine. Urinalysis sent to lab.

## 2020-03-22 LAB — COMPREHENSIVE METABOLIC PANEL
ALT: 105 U/L — ABNORMAL HIGH (ref 0–44)
AST: 57 U/L — ABNORMAL HIGH (ref 15–41)
Albumin: 1.8 g/dL — ABNORMAL LOW (ref 3.5–5.0)
Alkaline Phosphatase: 68 U/L (ref 38–126)
Anion gap: 10 (ref 5–15)
BUN: 29 mg/dL — ABNORMAL HIGH (ref 8–23)
CO2: 21 mmol/L — ABNORMAL LOW (ref 22–32)
Calcium: 7.8 mg/dL — ABNORMAL LOW (ref 8.9–10.3)
Chloride: 108 mmol/L (ref 98–111)
Creatinine, Ser: 1.4 mg/dL — ABNORMAL HIGH (ref 0.61–1.24)
GFR, Estimated: 54 mL/min — ABNORMAL LOW (ref >60)
Glucose, Bld: 106 mg/dL — ABNORMAL HIGH (ref 70–99)
Potassium: 4 mmol/L (ref 3.5–5.1)
Sodium: 139 mmol/L (ref 135–145)
Total Bilirubin: 1.6 mg/dL — ABNORMAL HIGH (ref 0.3–1.2)
Total Protein: 4.8 g/dL — ABNORMAL LOW (ref 6.5–8.1)

## 2020-03-22 LAB — CBC
HCT: 32.2 % — ABNORMAL LOW (ref 39.0–52.0)
Hemoglobin: 11.1 g/dL — ABNORMAL LOW (ref 13.0–17.0)
MCH: 29.9 pg (ref 26.0–34.0)
MCHC: 34.5 g/dL (ref 30.0–36.0)
MCV: 86.8 fL (ref 80.0–100.0)
Platelets: 235 10*3/uL (ref 150–400)
RBC: 3.71 MIL/uL — ABNORMAL LOW (ref 4.22–5.81)
RDW: 13.2 % (ref 11.5–15.5)
WBC: 11.3 10*3/uL — ABNORMAL HIGH (ref 4.0–10.5)
nRBC: 0 % (ref 0.0–0.2)

## 2020-03-22 LAB — MAGNESIUM: Magnesium: 1.9 mg/dL (ref 1.7–2.4)

## 2020-03-22 LAB — MRSA PCR SCREENING: MRSA by PCR: NEGATIVE

## 2020-03-22 MED ORDER — ENOXAPARIN SODIUM 40 MG/0.4ML ~~LOC~~ SOLN
40.0000 mg | SUBCUTANEOUS | Status: DC
  Administered 2020-03-22: 13:00:00 40 mg via SUBCUTANEOUS
  Filled 2020-03-22: qty 0.4

## 2020-03-22 MED ORDER — METOPROLOL TARTRATE 5 MG/5ML IV SOLN
5.0000 mg | Freq: Once | INTRAVENOUS | Status: AC
  Administered 2020-03-22: 19:00:00 5 mg via INTRAVENOUS
  Filled 2020-03-22: qty 5

## 2020-03-22 MED ORDER — DILTIAZEM HCL 25 MG/5ML IV SOLN
15.0000 mg | Freq: Once | INTRAVENOUS | Status: DC
  Filled 2020-03-22: qty 5

## 2020-03-22 MED ORDER — SENNOSIDES-DOCUSATE SODIUM 8.6-50 MG PO TABS
2.0000 | ORAL_TABLET | Freq: Two times a day (BID) | ORAL | Status: DC
  Administered 2020-03-22 – 2020-03-26 (×6): 2 via ORAL
  Filled 2020-03-22 (×8): qty 2

## 2020-03-22 MED ORDER — DILTIAZEM LOAD VIA INFUSION
15.0000 mg | Freq: Once | INTRAVENOUS | Status: AC
  Administered 2020-03-22: 18:00:00 15 mg via INTRAVENOUS
  Filled 2020-03-22: qty 15

## 2020-03-22 MED ORDER — DILTIAZEM HCL-DEXTROSE 125-5 MG/125ML-% IV SOLN (PREMIX)
5.0000 mg/h | INTRAVENOUS | Status: DC
  Administered 2020-03-22: 18:00:00 5 mg/h via INTRAVENOUS
  Administered 2020-03-23 – 2020-03-25 (×6): 15 mg/h via INTRAVENOUS
  Filled 2020-03-22 (×9): qty 125

## 2020-03-22 MED ORDER — BACLOFEN 10 MG PO TABS
5.0000 mg | ORAL_TABLET | Freq: Three times a day (TID) | ORAL | Status: DC | PRN
  Administered 2020-03-22 (×2): 5 mg via ORAL
  Filled 2020-03-22 (×2): qty 1

## 2020-03-22 MED ORDER — HEPARIN (PORCINE) 25000 UT/250ML-% IV SOLN
1600.0000 [IU]/h | INTRAVENOUS | Status: DC
  Administered 2020-03-22: 20:00:00 950 [IU]/h via INTRAVENOUS
  Filled 2020-03-22 (×2): qty 250

## 2020-03-22 MED ORDER — BACLOFEN 10 MG PO TABS: 5.0000 mg | ORAL_TABLET | Freq: Three times a day (TID) | ORAL | Status: DC | PRN

## 2020-03-22 MED ORDER — DIAZEPAM 5 MG/ML IJ SOLN: 5.0000 mg | INTRAMUSCULAR | Status: DC | PRN

## 2020-03-22 NOTE — Evaluation (Signed)
Physical Therapy Evaluation Patient Details Name: Jorge Evans. MRN: [id number] DOB: 06-Aug-1950 Today's Date: 03/22/2020   History of Present Illness  Pt is a 69 y.o. male with PMH significant for lumbar surgery, ERCP 03/11/2020 with biliary stent placement, and cholecystectomy 03/09/2020. He presented with progressive abdominal pain. Noted to have elevated transaminitis and total bilirubin.  CT abdomen/pelvis with IV contrast with intra-/extrahepatic biliary duct dilation with filling defects within the common duct concerning for choledocholithiasis. Drain placed for bile leak on 12/18.    Clinical Impression  Pt admitted with above diagnosis. At the time of PT eval pt was able to perform transfers and ambulation with up to min assist for balance support and safety. +2 helpful for line management, as pt declining RW use. He states he "almost fell" with a walker, however feel he could benefit from utilizing RW if he changes his mind about trying it. Wife present and reports she has to return to work tomorrow (12/20), as FMLA for her is still pending. If pt returns home at d/c, he will likely not have someone with him during the day while she is working. Pt and wife are open to SNF level rehab at d/c, however they are hopeful pt will progress to being able to manage with [facility] therapies at d/c. Recommend OT eval as well to maximize therapies in-house in preparation for d/c. Pt currently with functional limitations due to the deficits listed below (see PT Problem List). Pt will benefit from skilled PT to increase their independence and safety with mobility to allow discharge to the venue listed below.       Follow Up Recommendations SNF;Supervision for mobility/OOB    Equipment Recommendations  None recommended by PT    Recommendations for Other Services OT consult     Precautions / Restrictions Precautions Precautions: Fall Precaution Comments: Pt very painful, especially with  hiccups Restrictions Weight Bearing Restrictions: No      Mobility  Bed Mobility Overal bed mobility: Needs Assistance Bed Mobility: Rolling;Sidelying to Sit Rolling: Min guard Sidelying to sit: Min assist       General bed mobility comments: Pt utilizing railing for support. Able to roll L and required assist for trunk elevation to full sitting position.    Transfers Overall transfer level: Needs assistance Equipment used: None Transfers: Sit to/from Stand Sit to Stand: +2 safety/equipment;Min assist         General transfer comment: Assist for power-up to full stand and to gain/maintain balance. Pt declining use of RW, stating he "almost fell" the last time he tried using it.  Ambulation/Gait Ambulation/Gait assistance: Min assist;+2 safety/equipment Gait Distance (Feet): 7 Feet Assistive device: 2 person hand held assist Gait Pattern/deviations: Shuffle;Trunk flexed;Narrow base of support Gait velocity: Decreased Gait velocity interpretation: <1.31 ft/sec, indicative of household ambulator General Gait Details: Slow and guarded with flexed trunk. Able to improve posture with cues but unable to maintain while taking steps. HHA provided for support.  Stairs            Wheelchair Mobility    Modified Rankin (Stroke Patients Only)       Balance Overall balance assessment: Needs assistance Sitting-balance support: Bilateral upper extremity supported;Feet supported Sitting balance-Leahy Scale: Fair     Standing balance support: Bilateral upper extremity supported;During functional activity Standing balance-Leahy Scale: Poor Standing balance comment: Reliant on UE support.  Pertinent Vitals/Pain Pain Assessment: Faces Faces Pain Scale: Hurts whole lot Pain Location: Abdomen with hiccups Pain Descriptors / Indicators: Operative site guarding;Grimacing;Moaning Pain Intervention(s): Monitored during session;Limited  activity within patient's tolerance;Repositioned    Home Living Family/patient expects to be discharged to:: Private residence Living Arrangements: Spouse/significant other Available Help at Discharge: Family;Available PRN/intermittently Type of Home: House Home Access: Stairs to enter Entrance Stairs-Rails: Left Entrance Stairs-Number of Steps: 5 Home Layout: One level Home Equipment: Walker - 2 wheels;Cane - single point;Wheelchair - manual Additional Comments: Wife is awaiting FMLA so she can be at home, but as of evaluation is supposed to go back to work 12/20    Prior Function Level of Independence: Independent         Comments: Retired 5 years ago due to extreme nausea and progressive pain, unknown reason per wife     Hand Dominance   Dominant Hand: Left    Extremity/Trunk Assessment   Upper Extremity Assessment Upper Extremity Assessment: Defer to OT evaluation    Lower Extremity Assessment Lower Extremity Assessment: Generalized weakness    Cervical / Trunk Assessment Cervical / Trunk Assessment: Other exceptions Cervical / Trunk Exceptions: Forward head posture with rounded shoulders. Able to extend trunk to neutral however unable to maintain.  Communication   Communication: No difficulties  Cognition Arousal/Alertness: Awake/alert Behavior During Therapy: WFL for tasks assessed/performed Overall Cognitive Status: Impaired/Different from baseline Area of Impairment: Following commands;Safety/judgement;Awareness                       Following Commands: Follows one step commands with increased time;Follows multi-step commands inconsistently;Follows multi-step commands with increased time Safety/Judgement: Decreased awareness of safety Awareness: Emergent          General Comments General comments (skin integrity, edema, etc.): Pt was instructed in incentive spirometer use.    Exercises     Assessment/Plan    PT Assessment Patient needs  continued PT services  PT Problem List Decreased strength;Decreased activity tolerance;Decreased balance;Decreased mobility;Decreased knowledge of use of DME;Decreased knowledge of precautions;Decreased safety awareness;Cardiopulmonary status limiting activity;Pain       PT Treatment Interventions DME instruction;Gait training;Functional mobility training;Therapeutic activities;Stair training;Therapeutic exercise;Neuromuscular re-education;Patient/family education;Cognitive remediation    PT Goals (Current goals can be found in the Care Plan section)  Acute Rehab PT Goals Patient Stated Goal: Be able to return home; decrease pain PT Goal Formulation: With patient/family Time For Goal Achievement: 04/05/20 Potential to Achieve Goals: Good    Frequency Min 3X/week   Barriers to discharge Decreased caregiver support Wife has to return to work tomorrow 12/20 and pt will not have 24 hour support until she gets FMLA approved.    Co-evaluation               AM-PAC PT "6 Clicks" Mobility  Outcome Measure Help needed turning from your back to your side while in a flat bed without using bedrails?: None Help needed moving from lying on your back to sitting on the side of a flat bed without using bedrails?: A Little Help needed moving to and from a bed to a chair (including a wheelchair)?: A Little Help needed standing up from a chair using your arms (e.g., wheelchair or bedside chair)?: A Little Help needed to walk in hospital room?: A Little Help needed climbing 3-5 steps with a railing? : A Lot 6 Click Score: 18    End of Session Equipment Utilized During Treatment:  (Gait belt deferred due to extreme abdominal  pain.) Activity Tolerance: Patient limited by pain;Patient limited by fatigue Patient left: in chair;with call bell/phone within reach;with family/visitor present Nurse Communication: Mobility status PT Visit Diagnosis: Unsteadiness on feet (R26.81);Pain;Muscle weakness  (generalized) (M62.81);Difficulty in walking, not elsewhere classified (R26.2) Pain - part of body:  (Abdomen)    Time: 1937-9024 PT Time Calculation (min) (ACUTE ONLY): 29 min   Charges:   PT Evaluation $PT Eval Moderate Complexity: 1 Mod PT Treatments $Gait Training: 8-22 mins        Conni Slipper, PT, DPT Acute Rehabilitation Services Pager: [phone] Office: [phone]   Marylynn Pearson 03/22/2020, 10:37 AM

## 2020-03-22 NOTE — Progress Notes (Signed)
Called by patient's RN asking for assistance with her patient who just converted into Afib RVR.  The patient was asymptomatic, and denies CP and SOB. MD ordered Cardizem gtt with loading bolus.  Since Cardizem is not a medication that can be titrated on this unit, I initiated the medication and have titrated up to 10 mL/hr.  RN will call me if further titration is needed for the patient, or if he becomes symptomatic.  The patient has PC orders placed, but he is awaiting a bed.

## 2020-03-22 NOTE — Progress Notes (Signed)
Tele called Clinical research associate to report patient had converted from NSR to A-fib w/RVR. Writer in to see patient and patient was resting in bed with no complaints of pain or SOB. VSS. MD Benjamine Mola notified and new order for cardizem drip, heparin drip, labs, and transfer level of care placed. RR nurse Luisa Hart paged to assist in initiating drip. EKG also obtained and placed in chart.   1830: MD Benjamine Mola made aware that patient HR was still A-fib ranging from 160-185. Placed order for IV push metoprolol. Patient still having no complaints. Waiting to begin IV heparin, once IV access in established.

## 2020-03-22 NOTE — Progress Notes (Signed)
Progress Note    Jorge Pi.  GXQ:[id number] DOB: 07/23/1950  DOA: 03/05/2020 PCP: Salley Scarlet, MD    Brief Narrative:     Medical records reviewed and are as summarized below:  Jorge Pi. is an 69 y.o. male with past medical history significant for GERD who presented with progressive abdominal pain.  Noted to have elevated transaminitis and total bilirubin.  CT abdomen/pelvis with IV contrast with intra-/extrahepatic biliary duct dilation with filling defects within the common duct concerning for choledocholithiasis.  Underwent ERCP on 03/28/2020, 3 large common bile duct stones noted, unable to perform a sphincterotomy and stone extraction due to anatomy s/p Billroth II; and had stent placement. 12/16: cholecystectomy.  Drain placed for bile leak on 12/18.    Assessment/Plan:   Principal Problem:   Choledocholithiasis Active Problems:   GERD (gastroesophageal reflux disease)   Iron deficiency anemia   Elevated LFTs   Choledocholithiasis --Gastroenterology, [name] --Zosyn --Gen Surgery recommends repeat ERCP and transfer to tertiary center vs surgical management of choledocholithiasis: per Dr. [place]: Received return call from [facility], GI service with Dr. Hildred Laser and Dr. Jamse Mead; no indication for transfer at this time given improving LFTs with stent adequately placed. They recommended surgical consultation if concern for cholecystitis, which General Surgery is currently consulted for here at [facility]. [facility] GI service would be happy to see the patient for outpatient consultation in the few weeks if needed. --s/p cholecystectomy on 12/16 --Pain control and antiemetics --Continue to follow LFTs and CBC daily -ambulate -12/18: I called and spoke with Dr. Ilsa Iha (GS at [facility]): He reviewed the notes and imaging sent.  He agrees with all that has been done thus far- stent and drain.  He is not sure what else he can offer at this time.  If  situation changes such as >500 out the drain/day we can call back-- so if transfer is needed either GI or GS Will need to call back if ERCP here is not able to be done in a timely manner or outpatient   hypokalemia -repleted  GERD --Carafate 1 g 4 times daily --Protonix 40 mg p.o. daily  Iron deficiency anemia -Fe  Hiccups -baclofen trial (worked prior to surgery)   Family Communication/Anticipated D/C date and plan/Code Status   DVT prophylaxis: Lovenox ordered. Code Status: Full Code.  Disposition Plan: Status is: Inpatient  Remains inpatient appropriate because:Inpatient level of care appropriate due to severity of illness   Dispo:  Patient From: Home  Planned Disposition: Home  Expected discharge date: 03/14/2020  Medically stable for discharge: No          Medical Consultants:    GI  GS  Subjective:   C/o hiccups today  Objective:    Vitals:   03/21/20 1210 03/21/20 1805 03/21/20 2254 03/22/20 0552  BP: (!) 138/52 (!) 138/52 132/66 124/88  Pulse: 82 81 87 83  Resp: 16 16 16 16   Temp: 97.7 F (36.5 C) 98.5 F (36.9 C) 98.6 F (37 C) 98.6 F (37 C)  TempSrc: Oral Oral Oral Oral  SpO2: 94% 92% 94% 94%  Weight:      Height:        Intake/Output Summary (Last 24 hours) at 03/22/2020 1043 Last data filed at 03/22/2020 0500 Gross per 24 hour  Intake 301.33 ml  Output 1150 ml  Net -848.67 ml   Filed Weights   04/03/2020 1442  Weight: 69.9 kg  Exam:   General: Appearance:    Well developed, well nourished male who appears uncomfortable   +BS, tender to palpation  Lungs:      respirations unlabored  Heart:    Normal heart rate. Normal rhythm. No murmurs, rubs, or gallops.   MS:   All extremities are intact.   Neurologic:   Awake, alert       Data Reviewed:   I have personally reviewed following labs and imaging studies:  Labs: Labs show the following:   Basic Metabolic Panel: Recent Labs  Lab 04/02/2020 0343  03/20/20 0406 03/21/20 0455 03/21/20 1535 03/22/20 0224  NA 138 136 139 139 139  K 3.2* 4.3 4.4 4.1 4.0  CL 107 104 109 108 108  CO2 23 23 18* 18* 21*  GLUCOSE 105* 163* 140* 131* 106*  BUN <5* 8 21 26* 29*  CREATININE 0.72 0.96 2.00* 1.63* 1.40*  CALCIUM 7.8* 7.9* 7.8* 7.6* 7.8*   GFR Estimated Creatinine Clearance: 44.3 mL/min (A) (by C-G formula based on SCr of 1.4 mg/dL (H)). Liver Function Tests: Recent Labs  Lab 03/18/20 0330 04/02/2020 0343 03/20/20 0406 03/21/20 0455 03/22/20 0224  AST 18 16 274* 104* 57*  ALT 38 29 249* 185* 105*  ALKPHOS 102 92 100 89 68  BILITOT 1.7* 1.4* 1.8* 1.9* 1.6*  PROT 5.5* 5.1* 5.9* 5.5* 4.8*  ALBUMIN 2.2* 2.1* 2.4* 2.2* 1.8*   No results for input(s): LIPASE, AMYLASE in the last 168 hours. No results for input(s): AMMONIA in the last 168 hours. Coagulation profile No results for input(s): INR, PROTIME in the last 168 hours.  CBC: Recent Labs  Lab 03/23/2020 0343 03/29/2020 1543 03/20/20 0406 03/21/20 0455 03/22/20 0224  WBC 9.9 23.1* 19.1* 20.0* 11.3*  HGB 12.9* 13.2 13.1 12.8* 11.1*  HCT 36.6* 40.0 37.5* 39.6 32.2*  MCV 85.1 87.1 86.2 89.0 86.8  PLT 159 235 259 254 235   Cardiac Enzymes: No results for input(s): CKTOTAL, CKMB, CKMBINDEX, TROPONINI in the last 168 hours. BNP (last 3 results) No results for input(s): PROBNP in the last 8760 hours. CBG: No results for input(s): GLUCAP in the last 168 hours. D-Dimer: No results for input(s): DDIMER in the last 72 hours. Hgb A1c: No results for input(s): HGBA1C in the last 72 hours. Lipid Profile: No results for input(s): CHOL, HDL, LDLCALC, TRIG, CHOLHDL, LDLDIRECT in the last 72 hours. Thyroid function studies: No results for input(s): TSH, T4TOTAL, T3FREE, THYROIDAB in the last 72 hours.  Invalid input(s): FREET3 Anemia work up: No results for input(s): VITAMINB12, FOLATE, FERRITIN, TIBC, IRON, RETICCTPCT in the last 72 hours. Sepsis Labs: Recent Labs  Lab  03/16/2020 1543 03/20/20 0406 03/21/20 0455 03/22/20 0224  WBC 23.1* 19.1* 20.0* 11.3*    Microbiology Recent Results (from the past 240 hour(s))  Resp Panel by RT-PCR (Flu A&B, Covid) Nasopharyngeal Swab     Status: None   Collection Time: Mar 14, 2020 12:30 AM   Specimen: Nasopharyngeal Swab; Nasopharyngeal(NP) swabs in vial transport medium  Result Value Ref Range Status   SARS Coronavirus 2 by RT PCR NEGATIVE NEGATIVE Final    Comment: (NOTE) SARS-CoV-2 target nucleic acids are NOT DETECTED.  The SARS-CoV-2 RNA is generally detectable in upper respiratory specimens during the acute phase of infection. The lowest concentration of SARS-CoV-2 viral copies this assay can detect is 138 copies/mL. A negative result does not preclude SARS-Cov-2 infection and should not be used as the sole basis for treatment or other patient management decisions. A negative  result may occur with  improper specimen collection/handling, submission of specimen other than nasopharyngeal swab, presence of viral mutation(s) within the areas targeted by this assay, and inadequate number of viral copies(<138 copies/mL). A negative result must be combined with clinical observations, patient history, and epidemiological information. The expected result is Negative.  Fact Sheet for Patients:  BloggerCourse.com  Fact Sheet for Healthcare Providers:  SeriousBroker.it  This test is no t yet approved or cleared by the [place] FDA and  has been authorized for detection and/or diagnosis of SARS-CoV-2 by FDA under an Emergency Use Authorization (EUA). This EUA will remain  in effect (meaning this test can be used) for the duration of the COVID-19 declaration under Section 564(b)(1) of the Act, 21 U.S.C.section 360bbb-3(b)(1), unless the authorization is terminated  or revoked sooner.       Influenza A by PCR NEGATIVE NEGATIVE Final   Influenza B by PCR  NEGATIVE NEGATIVE Final    Comment: (NOTE) The Xpert Xpress SARS-CoV-2/FLU/RSV plus assay is intended as an aid in the diagnosis of influenza from Nasopharyngeal swab specimens and should not be used as a sole basis for treatment. Nasal washings and aspirates are unacceptable for Xpert Xpress SARS-CoV-2/FLU/RSV testing.  Fact Sheet for Patients: BloggerCourse.com  Fact Sheet for Healthcare Providers: SeriousBroker.it  This test is not yet approved or cleared by the [place] FDA and has been authorized for detection and/or diagnosis of SARS-CoV-2 by FDA under an Emergency Use Authorization (EUA). This EUA will remain in effect (meaning this test can be used) for the duration of the COVID-19 declaration under Section 564(b)(1) of the Act, 21 U.S.C. section 360bbb-3(b)(1), unless the authorization is terminated or revoked.  Performed at [facility], [address] [place], [place] [address]   Aerobic/Anaerobic Culture (surgical/deep wound)     Status: None (Preliminary result)   Collection Time: 03/21/20 10:37 AM   Specimen: Abscess  Result Value Ref Range Status   Specimen Description ABSCESS  Final   Special Requests PERIHEPATIC BILOMA  Final   Gram Stain   Final    FEW WBC PRESENT, PREDOMINANTLY PMN ABUNDANT GRAM NEGATIVE RODS MODERATE GRAM POSITIVE COCCI IN PAIRS IN CHAINS Performed at [facility], [address], [place], [place] [address]    Culture PENDING  Incomplete   Report Status PENDING  Incomplete    Procedures and diagnostic studies:  CT ABDOMEN PELVIS W CONTRAST  Result Date: 03/20/2020 CLINICAL DATA:  Umbilical pain after laparoscopic cholecystectomy yesterday. EXAM: CT ABDOMEN AND PELVIS WITH CONTRAST TECHNIQUE: Multidetector CT imaging of the abdomen and pelvis was performed using the standard protocol following bolus administration of intravenous contrast. CONTRAST:  OMNIPAQUE  IOHEXOL 300 MG/ML  SOLN COMPARISON:  CT 03/16/2020 FINDINGS: Lower chest: Small right and trace left pleural effusion. Associated compressive atelectasis. There are coronary artery calcifications. Fluid distends the distal esophagus. Hepatobiliary: Interval cholecystectomy with clips in the gallbladder fossa. Persistent but diminished biliary dilatation in the left lobe of the liver with new pneumobilia, likely sequela of ERCP. Plastic common bile duct stent. Common bile duct proximal to the stent measures 18 mm. Rounded density in the common bile duct measuring 9 mm may represent a retained stone at the proximal aspect of the stent, series 6, image 77. No focal hepatic lesion. Moderate volume perihepatic free fluid containing foci of air with non organized fluid and air in the gallbladder fossa. Pancreas: Parenchymal atrophy. No ductal dilatation or inflammation. Spleen: Normal in size without  focal abnormality. Adrenals/Urinary Tract: No adrenal nodule. No hydronephrosis or perinephric edema. Homogeneous renal enhancement . Absent renal excretion on delayed phase imaging. Slight bladder wall thickening and questionable bladder diverticulum at the dome, unchanged. Stomach/Bowel: Fluid distends the distal esophagus. Surgical clips at the gastroesophageal junction. Stomach is decompressed. Prior gastric surgery with suspected gastrojejunostomy. Decompressed small bowel without wall thickening or obstruction. Normal appendix. Small to moderate colonic stool burden without colonic inflammation. Mild distal diverticulosis without diverticulitis. Vascular/Lymphatic: Aortic and branch atherosclerosis. No aortic aneurysm. No portal vein thrombosis. No abdominopelvic adenopathy. Reproductive: Prostate is unremarkable. Other: Moderate volume of perihepatic fluid which measures simple fluid density. Multiple foci of internal air. Ill-defined air fluid collection in the gallbladder fossa without peripherally enhancing  collection. Occasional foci of free air in the upper abdomen not unexpected post recent surgery. Scattered air in the subcutaneous tissues from recent laparoscopy. Trace free fluid tracks into the pelvis. Musculoskeletal: S shaped scoliosis and multilevel degenerative change in the spine. Chronic dysplastic deformity of the left acetabulum and proximal femur. There are no acute or suspicious osseous abnormalities. IMPRESSION: 1. Interval cholecystectomy. Moderate volume of perihepatic free fluid containing foci of air with non organized fluid and air in the gallbladder fossa. Given the amount of fluid, biloma is favored over postoperative seroma. Sterility is indeterminate by imaging. Consider nuclear medicine hepatic biliary scan to assess for bile leak. 2. Plastic common bile duct stent in place. Suspected residual 9 mm stone at the proximal aspect of the stent. Common bile duct dilatation of 16 mm, with improved but persistent intrahepatic biliary dilatation on the left. Pneumobilia related to recent procedure. 3. Small right and trace left pleural effusions. 4. Absent renal excretion on delayed phase imaging, can be seen with renal dysfunction. 5. Mild distal colonic diverticulosis without diverticulitis. Aortic Atherosclerosis (ICD10-I70.0). Electronically Signed   By: Narda Rutherford M.D.   On: 03/20/2020 23:11   CT IMAGE GUIDED FLUID DRAIN BY CATHETER  Result Date: 03/21/2020 CLINICAL DATA:  Status post cholecystectomy on 04/01/2020 with development of significant perihepatic fluid suspicious for bile leak. EXAM: CT GUIDED CATHETER DRAINAGE OF PERIHEPATIC ABSCESS ANESTHESIA/SEDATION: 1.5 mg IV Versed 75 mcg IV Fentanyl Total Moderate Sedation Time:  13 minutes The patient's level of consciousness and physiologic status were continuously monitored during the procedure by Radiology nursing. PROCEDURE: The procedure, risks, benefits, and alternatives were explained to the patient. Questions regarding the  procedure were encouraged and answered. The patient understands and consents to the procedure. A time out was performed prior to initiating the procedure. CT was performed through the upper abdomen in a supine position. The right abdominal wall was prepped with chlorhexidine in a sterile fashion, and a sterile drape was applied covering the operative field. A sterile gown and sterile gloves were used for the procedure. Local anesthesia was provided with 1% Lidocaine. Under CT guidance, a 18 gauge trocar needle was advanced into the lateral perihepatic space. After return of fluid, a guidewire was advanced, tract dilatation performed over the wire and a 12 French percutaneous drainage catheter advanced over the wire. Drainage catheter position was confirmed by CT. A fluid sample was withdrawn and sent for culture analysis. The drain was connected to a suction bulb. The bulb was emptied multiple times during the procedure. The drain was secured at the skin with a Prolene retention suture and StatLock device. COMPLICATIONS: None FINDINGS: Contiguous fluid is present beginning in the subdiaphragmatic space and continuing in the lateral perihepatic space. There is a relatively  small amount of fluid in the gallbladder fossa. There is a small amount of gas in the perihepatic fluid. After needle puncture and drain placement, there is return of dark, bilious fluid. After drain placement, there is rapid drainage of bilious fluid and several hundred mL of fluid was removed immediately after drain placement. IMPRESSION: CT-guided percutaneous catheter drainage of perihepatic fluid collection yielding dark, bilious fluid. A fluid sample was sent for culture analysis. A 12 French drain was placed in the lateral perihepatic space and attached to suction bulb drainage with rapid removal of bilious fluid. Electronically Signed   By: Irish LackGlenn  Yamagata M.D.   On: 03/21/2020 11:07    Medications:   . acetaminophen  1,000 mg Oral Q8H   . enoxaparin (LOVENOX) injection  40 mg Subcutaneous Q24H  . lidocaine  1 patch Transdermal Q24H  . lidocaine  1 application Urethral Once  . mupirocin ointment  1 application Nasal BID  . pantoprazole (PROTONIX) IV  40 mg Intravenous Q12H  . senna-docusate  2 tablet Oral BID  . sucralfate  1 g Oral TID WC & HS   Continuous Infusions: . lactated ringers 125 mL/hr at 03/22/20 0007  . piperacillin-tazobactam (ZOSYN)  IV 3.375 g (03/22/20 1028)     LOS: 9 days   Joseph ArtJessica U Margerie Fraiser  [facility]   How to contact the [facility] Attending or Consulting provider 7A - 7P or covering provider during after hours 7P -7A, for this patient?  1. Check the care team in [facility] and look for a) attending/consulting [facility] provider listed and b) the [facility] team listed 2. Log into [email or web address] and use [facility] universal password to access. If you do not have the password, please contact the hospital operator. 3. Locate the [facility] provider you are looking for under [facility] and page to a number that you can be directly reached. 4. If you still have difficulty reaching the provider, please page the [facility] (Director on Call) for the Hospitalists listed on amion for assistance.  03/22/2020, 10:43 AM

## 2020-03-22 NOTE — Progress Notes (Signed)
HR unstable between 110-130  On monitor. Current cardizem drip continue at 31ml/hr.

## 2020-03-22 NOTE — Progress Notes (Signed)
Notified by nurse that patient went into a fib with RVR.  No prior history of a fib from chart review.  Will check echo, tsh and Mg.  Transfer to progressive care unit.  Give IV cardizem bolus and then start titratable gtt.  BP stable at 158/105.  Patient asymptomatic.   Marlin Canary DO

## 2020-03-22 NOTE — Progress Notes (Signed)
[facility] Progress Note  3 Days Post-Op  Subjective: - s/p percutaneous drain placement by IR yesterday with bilious fluid in bulb  - Patient complains of abdominal wall spasms with have limited sleep  - Denies nausea, but minimal appetite or desire to drink fluids - Leukocytosis decreased to 11 from 20  - Continues on Zosyn   Objective: Vital signs in last 24 hours: Temp:  [97.7 F (36.5 C)-98.8 F (37.1 C)] 98.6 F (37 C) (12/19 0552) Pulse Rate:  [81-108] 83 (12/19 0552) Resp:  [12-31] 16 (12/19 0552) BP: (114-147)/(52-88) 124/88 (12/19 0552) SpO2:  [92 %-99 %] 94 % (12/19 0552) Last BM Date: 03/18/20  Intake/Output from previous day: 12/18 0701 - 12/19 0700 In: 813.9 [P.O.:240; I.V.:484.9; IV Piggyback:89] Out: 1615 [Urine:400; Drains:1215] Intake/Output this shift: No intake/output data recorded.  PE: Gen:  Alert, NAD HEENT: EOM's intact, pupils equal and round Pulm:  rate and effort normal Abd: Soft, mild distension, TTP RUQ, incisions C/D/I, RUQ drain in place with bilious fluid in bulb Psych: A&Ox4  Skin: no rashes noted, warm and dry   Lab Results:  Recent Labs    03/21/20 0455 03/22/20 0224  WBC 20.0* 11.3*  HGB 12.8* 11.1*  HCT 39.6 32.2*  PLT 254 235   BMET Recent Labs    03/21/20 1535 03/22/20 0224  NA 139 139  K 4.1 4.0  CL 108 108  CO2 18* 21*  GLUCOSE 131* 106*  BUN 26* 29*  CREATININE 1.63* 1.40*  CALCIUM 7.6* 7.8*   PT/INR No results for input(s): LABPROT, INR in the last 72 hours. CMP     Component Value Date/Time   NA 139 03/22/2020 0224   K 4.0 03/22/2020 0224   CL 108 03/22/2020 0224   CO2 21 (L) 03/22/2020 0224   GLUCOSE 106 (H) 03/22/2020 0224   BUN 29 (H) 03/22/2020 0224   CREATININE 1.40 (H) 03/22/2020 0224   CREATININE 0.93 11/19/2019 0842   CALCIUM 7.8 (L) 03/22/2020 0224   PROT 4.8 (L) 03/22/2020 0224   ALBUMIN 1.8 (L) 03/22/2020 0224   AST 57 (H) 03/22/2020 0224   ALT 105 (H) 03/22/2020 0224    ALKPHOS 68 03/22/2020 0224   BILITOT 1.6 (H) 03/22/2020 0224   GFRNONAA 54 (L) 03/22/2020 0224   GFRAA >60 11/11/2016 1600   Lipase     Component Value Date/Time   LIPASE 20 03/06/2020 1452       Studies/Results: CT ABDOMEN PELVIS W CONTRAST  Result Date: 03/20/2020 CLINICAL DATA:  Umbilical pain after laparoscopic cholecystectomy yesterday. EXAM: CT ABDOMEN AND PELVIS WITH CONTRAST TECHNIQUE: Multidetector CT imaging of the abdomen and pelvis was performed using the standard protocol following bolus administration of intravenous contrast. CONTRAST:  OMNIPAQUE IOHEXOL 300 MG/ML  SOLN COMPARISON:  CT 03/09/2020 FINDINGS: Lower chest: Small right and trace left pleural effusion. Associated compressive atelectasis. There are coronary artery calcifications. Fluid distends the distal esophagus. Hepatobiliary: Interval cholecystectomy with clips in the gallbladder fossa. Persistent but diminished biliary dilatation in the left lobe of the liver with new pneumobilia, likely sequela of ERCP. Plastic common bile duct stent. Common bile duct proximal to the stent measures 18 mm. Rounded density in the common bile duct measuring 9 mm may represent a retained stone at the proximal aspect of the stent, series 6, image 77. No focal hepatic lesion. Moderate volume perihepatic free fluid containing foci of air with non organized fluid and air in the gallbladder fossa. Pancreas: Parenchymal atrophy. No ductal  dilatation or inflammation. Spleen: Normal in size without focal abnormality. Adrenals/Urinary Tract: No adrenal nodule. No hydronephrosis or perinephric edema. Homogeneous renal enhancement . Absent renal excretion on delayed phase imaging. Slight bladder wall thickening and questionable bladder diverticulum at the dome, unchanged. Stomach/Bowel: Fluid distends the distal esophagus. Surgical clips at the gastroesophageal junction. Stomach is decompressed. Prior gastric surgery with suspected  gastrojejunostomy. Decompressed small bowel without wall thickening or obstruction. Normal appendix. Small to moderate colonic stool burden without colonic inflammation. Mild distal diverticulosis without diverticulitis. Vascular/Lymphatic: Aortic and branch atherosclerosis. No aortic aneurysm. No portal vein thrombosis. No abdominopelvic adenopathy. Reproductive: Prostate is unremarkable. Other: Moderate volume of perihepatic fluid which measures simple fluid density. Multiple foci of internal air. Ill-defined air fluid collection in the gallbladder fossa without peripherally enhancing collection. Occasional foci of free air in the upper abdomen not unexpected post recent surgery. Scattered air in the subcutaneous tissues from recent laparoscopy. Trace free fluid tracks into the pelvis. Musculoskeletal: S shaped scoliosis and multilevel degenerative change in the spine. Chronic dysplastic deformity of the left acetabulum and proximal femur. There are no acute or suspicious osseous abnormalities. IMPRESSION: 1. Interval cholecystectomy. Moderate volume of perihepatic free fluid containing foci of air with non organized fluid and air in the gallbladder fossa. Given the amount of fluid, biloma is favored over postoperative seroma. Sterility is indeterminate by imaging. Consider nuclear medicine hepatic biliary scan to assess for bile leak. 2. Plastic common bile duct stent in place. Suspected residual 9 mm stone at the proximal aspect of the stent. Common bile duct dilatation of 16 mm, with improved but persistent intrahepatic biliary dilatation on the left. Pneumobilia related to recent procedure. 3. Small right and trace left pleural effusions. 4. Absent renal excretion on delayed phase imaging, can be seen with renal dysfunction. 5. Mild distal colonic diverticulosis without diverticulitis. Aortic Atherosclerosis (ICD10-I70.0). Electronically Signed   By: Narda Rutherford M.D.   On: 03/20/2020 23:11   CT IMAGE  GUIDED FLUID DRAIN BY CATHETER  Result Date: 03/21/2020 CLINICAL DATA:  Status post cholecystectomy on 03/06/2020 with development of significant perihepatic fluid suspicious for bile leak. EXAM: CT GUIDED CATHETER DRAINAGE OF PERIHEPATIC ABSCESS ANESTHESIA/SEDATION: 1.5 mg IV Versed 75 mcg IV Fentanyl Total Moderate Sedation Time:  13 minutes The patient's level of consciousness and physiologic status were continuously monitored during the procedure by Radiology nursing. PROCEDURE: The procedure, risks, benefits, and alternatives were explained to the patient. Questions regarding the procedure were encouraged and answered. The patient understands and consents to the procedure. A time out was performed prior to initiating the procedure. CT was performed through the upper abdomen in a supine position. The right abdominal wall was prepped with chlorhexidine in a sterile fashion, and a sterile drape was applied covering the operative field. A sterile gown and sterile gloves were used for the procedure. Local anesthesia was provided with 1% Lidocaine. Under CT guidance, a 18 gauge trocar needle was advanced into the lateral perihepatic space. After return of fluid, a guidewire was advanced, tract dilatation performed over the wire and a 12 French percutaneous drainage catheter advanced over the wire. Drainage catheter position was confirmed by CT. A fluid sample was withdrawn and sent for culture analysis. The drain was connected to a suction bulb. The bulb was emptied multiple times during the procedure. The drain was secured at the skin with a Prolene retention suture and StatLock device. COMPLICATIONS: None FINDINGS: Contiguous fluid is present beginning in the subdiaphragmatic space and continuing in  the lateral perihepatic space. There is a relatively small amount of fluid in the gallbladder fossa. There is a small amount of gas in the perihepatic fluid. After needle puncture and drain placement, there is return  of dark, bilious fluid. After drain placement, there is rapid drainage of bilious fluid and several hundred mL of fluid was removed immediately after drain placement. IMPRESSION: CT-guided percutaneous catheter drainage of perihepatic fluid collection yielding dark, bilious fluid. A fluid sample was sent for culture analysis. A 12 French drain was placed in the lateral perihepatic space and attached to suction bulb drainage with rapid removal of bilious fluid. Electronically Signed   By: Irish Lack M.D.   On: 03/21/2020 11:07    Anti-infectives: Anti-infectives (From admission, onward)   Start     Dose/Rate Route Frequency Ordered Stop   03/21/20 0200  piperacillin-tazobactam (ZOSYN) IVPB 3.375 g        3.375 g 12.5 mL/hr over 240 Minutes Intravenous Every 8 hours 03/21/20 0007     04/03/2020 0600  ceFAZolin (ANCEF) IVPB 2g/100 mL premix        2 g 200 mL/hr over 30 Minutes Intravenous On call to O.R. 03/18/20 1334 03/20/20 0559   03/24/2020 0600  piperacillin-tazobactam (ZOSYN) IVPB 3.375 g  Status:  Discontinued        3.375 g 12.5 mL/hr over 240 Minutes Intravenous Every 8 hours 03/27/2020 0046 03/15/20 1109   03/22/2020 2330  piperacillin-tazobactam (ZOSYN) IVPB 3.375 g        3.375 g 100 mL/hr over 30 Minutes Intravenous  Once 03/23/2020 2317 03/12/2020 0131       Assessment/Plan GERD IDA PMH Bilroth II for bleeding gastric ulcers  ABL anemia - Hgb 12, stable  Symptomatic cholelithiasis Choledocholithiasis without evidence of acute cholecystitis  S/p ERCP, placement of CBD stent 12/10 [name] - unable to perform a sphincterotomy and stone extraction due to anatomy s/p Billroth II - GI arranging repeat ERCP at some point with Dr. Johnella Moloney, unsure when this is going to happen, he also recommends initiating transfer to tertiary care facility as repeat ERCP here may not happen quickly  S/p laparoscopic cholecystectomy 12/16 Dr. Janee Morn Postoperative bile leak - POD#3 - CT scan  12/17 revealed moderate volume perihepatic free fluid containing foci of air with non organized fluid and air in the gallbladder fossa concerning for biloma - s/p IR drain placement 12/18 with yield high volume bilious fluid, culture pending - CLD with advancement as tolerated  - Added Valium for abdominal wall spasms  ID: [id number] 12/18>> FEN:IVF, CLD VTE: SCDs, ok to restart lovenox from surgical standpoint EX:[id number] periop Foley: none   LOS: 9 days    Lannette Donath, MD [facility] 03/22/2020, 9:40 AM Please see Amion for pager number during day hours 7:00am-4:30pm

## 2020-03-22 NOTE — Progress Notes (Signed)
Report given to Shanda Bumps, RN 2C. Family updated of transfer. All belongings sent. Patient will be transfer to 5C11.

## 2020-03-22 NOTE — Progress Notes (Signed)
   03/22/20 2039  Assess: MEWS Score  Temp 98.5 F (36.9 C)  BP 121/78  Pulse Rate (!) 112  ECG Heart Rate (!) 108  Resp (!) 24  Level of Consciousness Alert  SpO2 95 %  O2 Device Room Air  Assess: MEWS Score  MEWS Temp 0  MEWS Systolic 0  MEWS Pulse 1  MEWS RR 1  MEWS LOC 0  MEWS Score 2  MEWS Score Color Yellow  Assess: if the MEWS score is Yellow or Red  Were vital signs taken at a resting state? Yes  Focused Assessment No change from prior assessment  Early Detection of Sepsis Score *See Row Information* Low  MEWS guidelines implemented *See Row Information* Yes  Treat  MEWS Interventions Escalated (See documentation below)  Pain Scale 0-10  Pain Score 0  Take Vital Signs  Increase Vital Sign Frequency  Yellow: Q 2hr X 2 then Q 4hr X 2, if remains yellow, continue Q 4hrs  Escalate  MEWS: Escalate Yellow: discuss with charge nurse/RN and consider discussing with provider and RRT  Notify: Charge Nurse/RN  Name of Charge Nurse/RN Notified Eldred Manges, RN  Date Charge Nurse/RN Notified 03/22/20  Time Charge Nurse/RN Notified 2039  Document  Patient Outcome Not stable and remains on department  Progress note created (see row info) Yes

## 2020-03-22 NOTE — Progress Notes (Signed)
ANTICOAGULATION CONSULT NOTE  Pharmacy Consult for heparin Indication: atrial fibrillation  Allergies  Allergen Reactions  . Other Itching  . Demerol [Meperidine] Rash    Patient Measurements: Height: 5\' 6"  (167.6 cm) Weight: 69.9 kg (154 lb) IBW/kg (Calculated) : 63.8 Heparin Dosing Weight: 69kg  Vital Signs: Temp: 98.4 F (36.9 C) (12/19 1208) Temp Source: Oral (12/19 1208) BP: 158/105 (12/19 1712) Pulse Rate: 76 (12/19 1712)  Labs: Recent Labs    03/20/20 0406 03/21/20 0455 03/21/20 1535 03/22/20 0224  HGB 13.1 12.8*  --  11.1*  HCT 37.5* 39.6  --  32.2*  PLT 259 254  --  235  CREATININE 0.96 2.00* 1.63* 1.40*    Estimated Creatinine Clearance: 44.3 mL/min (A) (by C-G formula based on SCr of 1.4 mg/dL (H)).   Medical History: Past Medical History:  Diagnosis Date  . Anemia   . GERD (gastroesophageal reflux disease)      Assessment: 58 yoM admitted with abdominal pain s/p ERCP 12/10, cholecystectomy 12/16, and drain placement 12/18. Pt developed AFib, pharmacy to start heparin infusion. Pt received prophylactic enoxaparin ~5 hr ago, because of this and recent procedures will not give heparin bolus.  Goal of Therapy:  Heparin level 0.3-0.7 units/ml Monitor platelets by anticoagulation protocol: Yes   Plan:  -Heparin 950 units/h - no bolus -Check 8h heparin level -Daily heparin level and CBC   1/19, PharmD, BCPS, [facility] Clinical Pharmacist [phone] Please check AMION for all [facility] Pharmacy numbers 03/22/2020

## 2020-03-23 ENCOUNTER — Inpatient Hospital Stay (HOSPITAL_COMMUNITY): Payer: Medicare Other

## 2020-03-23 DIAGNOSIS — I4891 Unspecified atrial fibrillation: Secondary | ICD-10-CM

## 2020-03-23 DIAGNOSIS — R0600 Dyspnea, unspecified: Secondary | ICD-10-CM

## 2020-03-23 LAB — HEPARIN LEVEL (UNFRACTIONATED)
Heparin Unfractionated: <0.1 IU/mL — ABNORMAL LOW (ref 0.30–0.70)
Heparin Unfractionated: <0.1 IU/mL — ABNORMAL LOW (ref 0.30–0.70)
Heparin Unfractionated: <0.1 IU/mL — ABNORMAL LOW (ref 0.30–0.70)

## 2020-03-23 LAB — COMPREHENSIVE METABOLIC PANEL
ALT: 90 U/L — ABNORMAL HIGH (ref 0–44)
AST: 78 U/L — ABNORMAL HIGH (ref 15–41)
Albumin: 1.7 g/dL — ABNORMAL LOW (ref 3.5–5.0)
Alkaline Phosphatase: 73 U/L (ref 38–126)
Anion gap: 11 (ref 5–15)
BUN: 24 mg/dL — ABNORMAL HIGH (ref 8–23)
CO2: 20 mmol/L — ABNORMAL LOW (ref 22–32)
Calcium: 7.7 mg/dL — ABNORMAL LOW (ref 8.9–10.3)
Chloride: 105 mmol/L (ref 98–111)
Creatinine, Ser: 0.93 mg/dL (ref 0.61–1.24)
GFR, Estimated: >60 mL/min (ref >60)
Glucose, Bld: 99 mg/dL (ref 70–99)
Potassium: 3.9 mmol/L (ref 3.5–5.1)
Sodium: 136 mmol/L (ref 135–145)
Total Bilirubin: 2.1 mg/dL — ABNORMAL HIGH (ref 0.3–1.2)
Total Protein: 5 g/dL — ABNORMAL LOW (ref 6.5–8.1)

## 2020-03-23 LAB — CBC
HCT: 36.2 % — ABNORMAL LOW (ref 39.0–52.0)
Hemoglobin: 11.8 g/dL — ABNORMAL LOW (ref 13.0–17.0)
MCH: 28.6 pg (ref 26.0–34.0)
MCHC: 32.6 g/dL (ref 30.0–36.0)
MCV: 87.7 fL (ref 80.0–100.0)
Platelets: 326 10*3/uL (ref 150–400)
RBC: 4.13 MIL/uL — ABNORMAL LOW (ref 4.22–5.81)
RDW: 13.3 % (ref 11.5–15.5)
WBC: 12 10*3/uL — ABNORMAL HIGH (ref 4.0–10.5)
nRBC: 0 % (ref 0.0–0.2)

## 2020-03-23 LAB — ECHOCARDIOGRAM COMPLETE
Height: 66 in
S' Lateral: 2.5 cm
Weight: 2464 oz

## 2020-03-23 LAB — TSH: TSH: 2.804 u[IU]/mL (ref 0.350–4.500)

## 2020-03-23 LAB — BRAIN NATRIURETIC PEPTIDE: B Natriuretic Peptide: 1235.1 pg/mL — ABNORMAL HIGH (ref 0.0–100.0)

## 2020-03-23 MED ORDER — OXYCODONE HCL 5 MG PO TABS
5.0000 mg | ORAL_TABLET | Freq: Once | ORAL | Status: AC
  Administered 2020-03-23: 23:00:00 5 mg via ORAL
  Filled 2020-03-23: qty 1

## 2020-03-23 MED ORDER — PERFLUTREN LIPID MICROSPHERE
1.0000 mL | INTRAVENOUS | Status: AC | PRN
Start: 2020-03-23 — End: 2020-03-23
  Administered 2020-03-23: 15:00:00 3 mL via INTRAVENOUS
  Filled 2020-03-23: qty 10

## 2020-03-23 MED ORDER — HEPARIN BOLUS VIA INFUSION
2000.0000 [IU] | Freq: Once | INTRAVENOUS | Status: AC
  Administered 2020-03-23: 12:00:00 2000 [IU] via INTRAVENOUS
  Filled 2020-03-23: qty 2000

## 2020-03-23 MED ORDER — HEPARIN BOLUS VIA INFUSION
2000.0000 [IU] | Freq: Once | INTRAVENOUS | Status: AC
  Administered 2020-03-23: 05:00:00 2000 [IU] via INTRAVENOUS
  Filled 2020-03-23: qty 2000

## 2020-03-23 MED ORDER — METOPROLOL TARTRATE 25 MG PO TABS
25.0000 mg | ORAL_TABLET | Freq: Two times a day (BID) | ORAL | Status: DC
  Administered 2020-03-23 – 2020-03-26 (×7): 25 mg via ORAL
  Filled 2020-03-23 (×7): qty 1

## 2020-03-23 MED ORDER — SODIUM CHLORIDE 0.9 % IV SOLN: INTRAVENOUS | Status: DC

## 2020-03-23 MED ORDER — HEPARIN BOLUS VIA INFUSION
2000.0000 [IU] | Freq: Once | INTRAVENOUS | Status: AC
  Administered 2020-03-23: 21:00:00 2000 [IU] via INTRAVENOUS
  Filled 2020-03-23: qty 2000

## 2020-03-23 MED ORDER — IOHEXOL 9 MG/ML PO SOLN
500.0000 mL | ORAL | Status: AC
  Administered 2020-03-23: 12:00:00 500 mL via ORAL

## 2020-03-23 NOTE — Progress Notes (Signed)
ANTICOAGULATION CONSULT NOTE Pharmacy Consult for heparin Indication: atrial fibrillation  Allergies  Allergen Reactions  . Other Itching  . Demerol [Meperidine] Rash    Patient Measurements: Height: 5\' 6"  (167.6 cm) Weight: 69.9 kg (154 lb) IBW/kg (Calculated) : 63.8 Heparin Dosing Weight: 69kg  Vital Signs: Temp: 98.8 F (37.1 C) (12/20 1900) Temp Source: Oral (12/20 1900) BP: 123/68 (12/20 1900) Pulse Rate: 97 (12/20 1900)  Labs: Recent Labs    03/21/20 0455 03/21/20 1535 03/22/20 0224 03/23/20 0232 03/23/20 0950 03/23/20 1955  HGB 12.8*  --  11.1* 11.8*  --   --   HCT 39.6  --  32.2* 36.2*  --   --   PLT 254  --  235 326  --   --   HEPARINUNFRC  --   --   --  <0.10* <0.10* <0.10*  CREATININE 2.00* 1.63* 1.40* 0.93  --   --     Estimated Creatinine Clearance: 66.7 mL/min (by C-G formula based on SCr of 0.93 mg/dL).  Assessment: 69 y.o. male with Afib for hepain - no AC PTA.   Heparin level came back undetectable, on 1400 units/hr. Hgb 11.8, plt 326. No s/sx of bleeding or infusion issues.   Goal of Therapy:  Heparin level 0.3-0.7 units/ml Monitor platelets by anticoagulation protocol: Yes   Plan:  Heparin 2000 units IV bolus, then increase heparin to 1600 units/hr Check heparin level in 8 hours.  Monitor daily HL, CBC, and for s/sx of bleeding.   66, PharmD, [facility] Clinical Pharmacist Please see AMION for all Pharmacists' Contact Phone Numbers 03/23/2020, 8:44 PM

## 2020-03-23 NOTE — Progress Notes (Signed)
OT Cancellation Note  Patient Details Name: Jorge Evans. MRN: [id number] DOB: June 06, 1950   Cancelled Treatment:    Reason Eval/Treat Not Completed: Medical issues which prohibited therapy Pt converted into afib RVR 12/19 and started on heparin drip 12/19 at 2026. Will hold therapy today as heparin must be given 24hrs prior to initiating therapy. Will return as time allows and pt is appropriate.   [facility] OTR/L Acute Rehabilitation Services Office: [phone]  Rebeca Alert 03/23/2020, 8:22 AM

## 2020-03-23 NOTE — Progress Notes (Signed)
PT Cancellation Note  Patient Details Name: Jorge Evans. MRN: [id number] DOB: Oct 26, 1950   Cancelled Treatment:    Reason Eval/Treat Not Completed: Patient not medically ready Pt went into A-fib with RVR 12/19 and started on IV Heparin. Per protocol, pt needs to be on Heparin for 24 hours or therapeutic prior to initiation of PT. Will follow for appropriateness.    Blake Divine A Oleda Borski 03/23/2020, 9:04 AM [place], PT, DPT Acute Rehabilitation Services Pager [phone] Office [phone]

## 2020-03-23 NOTE — Progress Notes (Addendum)
ANTICOAGULATION CONSULT NOTE Pharmacy Consult for heparin Indication: atrial fibrillation  Allergies  Allergen Reactions  . Other Itching  . Demerol [Meperidine] Rash    Patient Measurements: Height: 5\' 6"  (167.6 cm) Weight: 69.9 kg (154 lb) IBW/kg (Calculated) : 63.8 Heparin Dosing Weight: 69kg  Vital Signs: Temp: 98.2 F (36.8 C) (12/20 0200) Temp Source: Oral (12/20 0200) BP: 100/60 (12/20 0400) Pulse Rate: 83 (12/20 0400)  Labs: Recent Labs    03/21/20 0455 03/21/20 1535 03/22/20 0224 03/23/20 0232  HGB 12.8*  --  11.1* 11.8*  HCT 39.6  --  32.2* 36.2*  PLT 254  --  235 326  HEPARINUNFRC  --   --   --  <0.10*  CREATININE 2.00* 1.63* 1.40* 0.93    Estimated Creatinine Clearance: 66.7 mL/min (by C-G formula based on SCr of 0.93 mg/dL).  Assessment: 69 y.o. male with Afib for hepain  Goal of Therapy:  Heparin level 0.3-0.7 units/ml Monitor platelets by anticoagulation protocol: Yes   Plan:  Heparin 2000 units IV bolus, then increase heparin 1200 units/hr Check heparin level in 8 hours.   66, PharmD, BCPS 03/23/2020

## 2020-03-23 NOTE — Progress Notes (Signed)
  Echocardiogram 2D Echocardiogram has been performed.  Delcie Roch 03/23/2020, 3:25 PM

## 2020-03-23 NOTE — Progress Notes (Signed)
Progress Note  4 Days Post-Op  Subjective: Patient confused and lethargic this AM. Patient's wife at the bedside and reports he has been more confused for her as well. He appears comfortable. He did spit up some yesterday after jello, no BM.   Objective: Vital signs in last 24 hours: Temp:  [98 F (36.7 C)-98.6 F (37 C)] 98.2 F (36.8 C) (12/20 0700) Pulse Rate:  [44-123] 112 (12/20 0700) Resp:  [12-24] 24 (12/20 0700) BP: (100-159)/(60-105) 131/67 (12/20 0700) SpO2:  [91 %-98 %] 93 % (12/20 0700) Last BM Date: 03/18/20  Intake/Output from previous day: 12/19 0701 - 12/20 0700 In: 854.9 [I.V.:673.4; IV Piggyback:181.5] Out: 955 [Urine:550; Drains:405] Intake/Output this shift: Total I/O In: -  Out: 62 [Drains:60]  PE: General: lethargic, WD, overweight male Heart: irregularly irregular in the 100-110s Lungs: CTAB, no wheezes, rhonchi, or rales noted.  Respiratory effort nonlabored Abd: soft, NT, moderately distended, incisions c/d/i, drain in RUQ with bilious fluid    Lab Results:  Recent Labs    03/22/20 0224 03/23/20 0232  WBC 11.3* 12.0*  HGB 11.1* 11.8*  HCT 32.2* 36.2*  PLT 235 326   BMET Recent Labs    03/22/20 0224 03/23/20 0232  NA 139 136  K 4.0 3.9  CL 108 105  CO2 21* 20*  GLUCOSE 106* 99  BUN 29* 24*  CREATININE 1.40* 0.93  CALCIUM 7.8* 7.7*   PT/INR No results for input(s): LABPROT, INR in the last 72 hours. CMP     Component Value Date/Time   NA 136 03/23/2020 0232   K 3.9 03/23/2020 0232   CL 105 03/23/2020 0232   CO2 20 (L) 03/23/2020 0232   GLUCOSE 99 03/23/2020 0232   BUN 24 (H) 03/23/2020 0232   CREATININE 0.93 03/23/2020 0232   CREATININE 0.93 11/19/2019 0842   CALCIUM 7.7 (L) 03/23/2020 0232   PROT 5.0 (L) 03/23/2020 0232   ALBUMIN 1.7 (L) 03/23/2020 0232   AST 78 (H) 03/23/2020 0232   ALT 90 (H) 03/23/2020 0232   ALKPHOS 73 03/23/2020 0232   BILITOT 2.1 (H) 03/23/2020 0232   GFRNONAA >60 03/23/2020 0232   GFRAA  >60 11/11/2016 1600   Lipase     Component Value Date/Time   LIPASE 20 03/15/2020 1452       Studies/Results: CT IMAGE GUIDED FLUID DRAIN BY CATHETER  Result Date: 03/21/2020 CLINICAL DATA:  Status post cholecystectomy on 03/07/2020 with development of significant perihepatic fluid suspicious for bile leak. EXAM: CT GUIDED CATHETER DRAINAGE OF PERIHEPATIC ABSCESS ANESTHESIA/SEDATION: 1.5 mg IV Versed 75 mcg IV Fentanyl Total Moderate Sedation Time:  13 minutes The patient's level of consciousness and physiologic status were continuously monitored during the procedure by Radiology nursing. PROCEDURE: The procedure, risks, benefits, and alternatives were explained to the patient. Questions regarding the procedure were encouraged and answered. The patient understands and consents to the procedure. A time out was performed prior to initiating the procedure. CT was performed through the upper abdomen in a supine position. The right abdominal wall was prepped with chlorhexidine in a sterile fashion, and a sterile drape was applied covering the operative field. A sterile gown and sterile gloves were used for the procedure. Local anesthesia was provided with 1% Lidocaine. Under CT guidance, a 18 gauge trocar needle was advanced into the lateral perihepatic space. After return of fluid, a guidewire was advanced, tract dilatation performed over the wire and a 12 French percutaneous drainage catheter advanced over the wire. Drainage catheter position  was confirmed by CT. A fluid sample was withdrawn and sent for culture analysis. The drain was connected to a suction bulb. The bulb was emptied multiple times during the procedure. The drain was secured at the skin with a Prolene retention suture and StatLock device. COMPLICATIONS: None FINDINGS: Contiguous fluid is present beginning in the subdiaphragmatic space and continuing in the lateral perihepatic space. There is a relatively small amount of fluid in the  gallbladder fossa. There is a small amount of gas in the perihepatic fluid. After needle puncture and drain placement, there is return of dark, bilious fluid. After drain placement, there is rapid drainage of bilious fluid and several hundred mL of fluid was removed immediately after drain placement. IMPRESSION: CT-guided percutaneous catheter drainage of perihepatic fluid collection yielding dark, bilious fluid. A fluid sample was sent for culture analysis. A 12 French drain was placed in the lateral perihepatic space and attached to suction bulb drainage with rapid removal of bilious fluid. Electronically Signed   By: Irish Lack M.D.   On: 03/21/2020 11:07    Anti-infectives: Anti-infectives (From admission, onward)   Start     Dose/Rate Route Frequency Ordered Stop   03/21/20 0200  piperacillin-tazobactam (ZOSYN) IVPB 3.375 g        3.375 g 12.5 mL/hr over 240 Minutes Intravenous Every 8 hours 03/21/20 0007     03/23/2020 0600  ceFAZolin (ANCEF) IVPB 2g/100 mL premix        2 g 200 mL/hr over 30 Minutes Intravenous On call to O.R. 03/18/20 1334 03/20/20 0559   04/01/2020 0600  piperacillin-tazobactam (ZOSYN) IVPB 3.375 g  Status:  Discontinued        3.375 g 12.5 mL/hr over 240 Minutes Intravenous Every 8 hours 03/19/2020 0046 03/15/20 1109   03/29/2020 2330  piperacillin-tazobactam (ZOSYN) IVPB 3.375 g        3.375 g 100 mL/hr over 30 Minutes Intravenous  Once 03/14/2020 2317 04/01/2020 0131       Assessment/Plan GERD IDA PMH Bilroth II for bleeding gastric ulcers ABL anemia - Hgb 11.8, stable A. Fib with RVR - on heparin and diltiazem gtts  Symptomatic cholelithiasis Choledocholithiasis without evidence of acute cholecystitis  S/p ERCP, placement of CBD stent 12/10 [name] -unable to perform a sphincterotomy and stone extraction due to anatomy s/p Billroth II - GI arranging repeat ERCP at some point with Dr. Johnella Moloney, unsure when this is going to happen but I spoke with Dr.  Elnoria Howard who will work on this and update family, he also recommends initiating transfer to tertiary care facility as repeat ERCP here may not happen quickly  S/plaparoscopic cholecystectomy12/16 Dr. Janee Morn Postoperative bile leak - POD#4 - CT scan 12/17 revealed moderate volume perihepatic free fluid containing foci of air with non organized fluid and air in the gallbladder fossa concerning for biloma - s/p IR drain placement 12/18 with yield high volume bilious fluid, culture pending - Bile leak is somewhat controlled with drain but output is going up and Tbili increasing, increased confusion and new onset A. Fib with RVR overnight  - if no plans for transfer or repeat ERCP, I would recommend PTC by IR. I have placed orders for this and made patient NPO. Patient would need heparin gtt held if this were to be done today   ID: zosyn 12/18>> FEN:IVF, NPO VTE: SCDs, heparin gtt Foley: none  LOS: 10 days    Juliet Rude , [facility] 03/23/2020, 9:21 AM Please see Amion  for pager number during day hours 7:00am-4:30pm

## 2020-03-23 NOTE — Progress Notes (Signed)
Progress Note    Jorge Pi.  XAJ:[id number] DOB: 24-Apr-1950  DOA: 03/17/2020 PCP: Salley Scarlet, MD    Brief Narrative:     Medical records reviewed and are as summarized below:  Jorge Pi. is an 69 y.o. male with past medical history significant for GERD who presented with progressive abdominal pain.  Noted to have elevated transaminitis and total bilirubin.  CT abdomen/pelvis with IV contrast with intra-/extrahepatic biliary duct dilation with filling defects within the common duct concerning for choledocholithiasis.  Underwent ERCP on 03/28/2020, 3 large common bile duct stones noted, unable to perform a sphincterotomy and stone extraction due to anatomy s/p Billroth II; and had stent placement. 12/16: cholecystectomy.  Drain placed for bile leak on 12/18.  ERCP planned for 21st  Assessment/Plan:   Principal Problem:   Choledocholithiasis Active Problems:   GERD (gastroesophageal reflux disease)   Iron deficiency anemia   Elevated LFTs   Atrial fibrillation with RVR (HCC)   A fib with RVR- no prior h/o: CHadVAsc2: 2 (age/PVD) -TSH ok Echo pending -IV cardizem titrate for HR 80-100 -add PO BB -heparin gtt -cards consult  Choledocholithiasis --Gastroenterology, [name] --Zosyn --s/p cholecystectomy on 12/16 --Pain control and antiemetics -ambulate -ERCP planned for tomorrow   hypokalemia -repleted  GERD --Carafate 1 g 4 times daily --Protonix 40 mg p.o. daily  Iron deficiency anemia -Fe    Family Communication/Anticipated D/C date and plan/Code Status   DVT prophylaxis: Lovenox ordered. Code Status: Full Code.  Disposition Plan: Status is: Inpatient  Remains inpatient appropriate because:Inpatient level of care appropriate due to severity of illness   Dispo:  Patient From: Home  Planned Disposition: Home  Expected discharge date: April 07, 2020  Medically stable for discharge: No          Medical Consultants:     GI  GS  Subjective:   Denies pain, does say he is tried of being in the hospital  Objective:    Vitals:   03/23/20 0400 03/23/20 0700 03/23/20 1009 03/23/20 1100  BP: 100/60 131/67 111/67 121/65  Pulse: 83 (!) 112 94 82  Resp: 12 (!) 24  (!) 25  Temp: 98 F (36.7 C) 98.2 F (36.8 C)  98.3 F (36.8 C)  TempSrc: Oral Oral  Oral  SpO2: 95% 93%  94%  Weight:      Height:        Intake/Output Summary (Last 24 hours) at 03/23/2020 1438 Last data filed at 03/23/2020 1200 Gross per 24 hour  Intake 854.88 ml  Output 1065 ml  Net -210.12 ml   Filed Weights   03/21/2020 1442  Weight: 69.9 kg    Exam:   General: Appearance:    In bed, eyes closed male in no acute distress     Lungs:     No wheezing, respirations unlabored  Heart:    Normal heart rate. irr. No murmurs, rubs, or gallops.   MS:   All extremities are intact.   Neurologic:   Awake, alert    Data Reviewed:   I have personally reviewed following labs and imaging studies:  Labs: Labs show the following:   Basic Metabolic Panel: Recent Labs  Lab 03/20/20 0406 03/21/20 0455 03/21/20 1535 03/22/20 0224 03/23/20 0232  NA 136 139 139 139 136  K 4.3 4.4 4.1 4.0 3.9  CL 104 109 108 108 105  CO2 23 18* 18* 21* 20*  GLUCOSE 163* 140* 131* 106* 99  BUN 8  21 26* 29* 24*  CREATININE 0.96 2.00* 1.63* 1.40* 0.93  CALCIUM 7.9* 7.8* 7.6* 7.8* 7.7*  MG  --   --   --  1.9  --    GFR Estimated Creatinine Clearance: 66.7 mL/min (by C-G formula based on SCr of 0.93 mg/dL). Liver Function Tests: Recent Labs  Lab April 07, 2020 0343 03/20/20 0406 03/21/20 0455 03/22/20 0224 03/23/20 0232  AST 16 274* 104* 57* 78*  ALT 29 249* 185* 105* 90*  ALKPHOS 92 100 89 68 73  BILITOT 1.4* 1.8* 1.9* 1.6* 2.1*  PROT 5.1* 5.9* 5.5* 4.8* 5.0*  ALBUMIN 2.1* 2.4* 2.2* 1.8* 1.7*   No results for input(s): LIPASE, AMYLASE in the last 168 hours. No results for input(s): AMMONIA in the last 168 hours. Coagulation  profile No results for input(s): INR, PROTIME in the last 168 hours.  CBC: Recent Labs  Lab 04-07-2020 1543 03/20/20 0406 03/21/20 0455 03/22/20 0224 03/23/20 0232  WBC 23.1* 19.1* 20.0* 11.3* 12.0*  HGB 13.2 13.1 12.8* 11.1* 11.8*  HCT 40.0 37.5* 39.6 32.2* 36.2*  MCV 87.1 86.2 89.0 86.8 87.7  PLT 235 259 254 235 326   Cardiac Enzymes: No results for input(s): CKTOTAL, CKMB, CKMBINDEX, TROPONINI in the last 168 hours. BNP (last 3 results) No results for input(s): PROBNP in the last 8760 hours. CBG: No results for input(s): GLUCAP in the last 168 hours. D-Dimer: No results for input(s): DDIMER in the last 72 hours. Hgb A1c: No results for input(s): HGBA1C in the last 72 hours. Lipid Profile: No results for input(s): CHOL, HDL, LDLCALC, TRIG, CHOLHDL, LDLDIRECT in the last 72 hours. Thyroid function studies: Recent Labs    03/23/20 0232  TSH 2.804   Anemia work up: No results for input(s): VITAMINB12, FOLATE, FERRITIN, TIBC, IRON, RETICCTPCT in the last 72 hours. Sepsis Labs: Recent Labs  Lab 03/20/20 0406 03/21/20 0455 03/22/20 0224 03/23/20 0232  WBC 19.1* 20.0* 11.3* 12.0*    Microbiology Recent Results (from the past 240 hour(s))  Aerobic/Anaerobic Culture (surgical/deep wound)     Status: Abnormal (Preliminary result)   Collection Time: 03/21/20 10:37 AM   Specimen: Abscess  Result Value Ref Range Status   Specimen Description ABSCESS  Final   Special Requests PERIHEPATIC BILOMA  Final   Gram Stain   Final    FEW WBC PRESENT, PREDOMINANTLY PMN ABUNDANT GRAM NEGATIVE RODS MODERATE GRAM POSITIVE COCCI IN PAIRS IN CHAINS Performed at [facility], [address] [place], [place] [address]    Culture (A)  Final    MULTIPLE ORGANISMS PRESENT, NONE PREDOMINANT NO ANAEROBES ISOLATED; CULTURE IN PROGRESS FOR 5 DAYS    Report Status PENDING  Incomplete  MRSA PCR Screening     Status: None   Collection Time: 03/22/20  8:57 PM   Specimen:  Nasopharyngeal  Result Value Ref Range Status   MRSA by PCR NEGATIVE NEGATIVE Final    Comment:        The GeneXpert MRSA Assay (FDA approved for NASAL specimens only), is one component of a comprehensive MRSA colonization surveillance program. It is not intended to diagnose MRSA infection nor to guide or monitor treatment for MRSA infections. Performed at [facility], [address], [place], [place] [address]     Procedures and diagnostic studies:  No results found.  Medications:   . acetaminophen  1,000 mg Oral Q8H  . lidocaine  1 patch Transdermal Q24H  . lidocaine  1 application Urethral Once  . metoprolol  tartrate  25 mg Oral BID  . mupirocin ointment  1 application Nasal BID  . pantoprazole (PROTONIX) IV  40 mg Intravenous Q12H  . senna-docusate  2 tablet Oral BID  . sucralfate  1 g Oral TID WC & HS   Continuous Infusions: . diltiazem (CARDIZEM) infusion 15 mg/hr (03/23/20 1008)  . heparin 1,400 Units/hr (03/23/20 1214)  . lactated ringers Stopped (03/22/20 2019)  . piperacillin-tazobactam (ZOSYN)  IV 3.375 g (03/23/20 1207)     LOS: 10 days   Joseph Art  [facility]   How to contact the [facility] Attending or Consulting provider 7A - 7P or covering provider during after hours 7P -7A, for this patient?  1. Check the care team in [facility] and look for a) attending/consulting [facility] provider listed and b) the [facility] team listed 2. Log into [email or web address] and use [place]'s universal password to access. If you do not have the password, please contact the hospital operator. 3. Locate the [facility] provider you are looking for under [facility] and page to a number that you can be directly reached. 4. If you still have difficulty reaching the provider, please page the [facility] (Director on Call) for the Hospitalists listed on amion for assistance.  03/23/2020, 2:38 PM

## 2020-03-23 NOTE — Progress Notes (Signed)
Referring Physician(s): Dr Derrell Lolling  Supervising Physician: Ruel Favors  Patient Status:  [facility] - In-pt  Chief Complaint:  Status post cholecystectomy on April 04, 2020 with development of significant perihepatic fluid suspicious for bile Leak.  Subjective:  12/18 IR procedure: CT-guided percutaneous catheter drainage of perihepatic fluid collection yielding dark, bilious fluid.  A 12 French drain was placed in the lateral perihepatic space and attached to suction bulb drainage with rapid removal of bilious fluid.  OP copious still Over 1.2 L yesterday; 500 cc today TB 2.1  Pt comfortable    Allergies: Other and Demerol [meperidine]  Medications: Prior to Admission medications   Medication Sig Start Date End Date Taking? Authorizing Provider  Cholecalciferol (VITAMIN D) 50 MCG (2000 UT) CAPS Take 1 capsule (2,000 Units total) by mouth daily. 01/21/20  Yes [place], Velna Hatchet, MD  ferrous sulfate 325 (65 FE) MG tablet Take 1 tablet (325 mg total) by mouth 2 (two) times daily with a meal. Patient taking differently: Take 325 mg by mouth daily with breakfast. 08/28/19  Yes [place], Velna Hatchet, MD  ondansetron (ZOFRAN) 4 MG tablet TAKE 1 TABLET BY MOUTH EVERY 8 HOURS AS NEEDED FOR NAUSEA AND VOMITING Patient taking differently: Take 4 mg by mouth every 8 (eight) hours as needed for nausea or vomiting. 01/21/20  Yes [place], Velna Hatchet, MD  sucralfate (CARAFATE) 1 g tablet TAKE 1 TABLET (1 G TOTAL) BY MOUTH 4 (FOUR) TIMES DAILY - WITH MEALS AND AT BEDTIME. 12/30/19  Yes , Velna Hatchet, MD     Vital Signs: BP 121/65 (BP Location: Right Arm)   Pulse 82   Temp 98.3 F (36.8 C) (Oral)   Resp (!) 25   Ht 5\' 6"  (1.676 m)   Wt 154 lb (69.9 kg)   SpO2 94%   BMI 24.86 kg/m   Physical Exam Vitals reviewed.  Abdominal:     Tenderness: There is abdominal tenderness.  Skin:    General: Skin is warm.     Comments: Site is clean and dry No bleeding OP is dark bile 1.2 L  yesterday; 500 cc today so far  afeb   Neurological:     Mental Status: He is alert.     Imaging: CT ABDOMEN PELVIS W CONTRAST  Result Date: 03/20/2020 CLINICAL DATA:  Umbilical pain after laparoscopic cholecystectomy yesterday. EXAM: CT ABDOMEN AND PELVIS WITH CONTRAST TECHNIQUE: Multidetector CT imaging of the abdomen and pelvis was performed using the standard protocol following bolus administration of intravenous contrast. CONTRAST:  03/22/2020 OMNIPAQUE IOHEXOL 300 MG/ML  SOLN COMPARISON:  CT 03/21/2020 FINDINGS: Lower chest: Small right and trace left pleural effusion. Associated compressive atelectasis. There are coronary artery calcifications. Fluid distends the distal esophagus. Hepatobiliary: Interval cholecystectomy with clips in the gallbladder fossa. Persistent but diminished biliary dilatation in the left lobe of the liver with new pneumobilia, likely sequela of ERCP. Plastic common bile duct stent. Common bile duct proximal to the stent measures 18 mm. Rounded density in the common bile duct measuring 9 mm may represent a retained stone at the proximal aspect of the stent, series 6, image 77. No focal hepatic lesion. Moderate volume perihepatic free fluid containing foci of air with non organized fluid and air in the gallbladder fossa. Pancreas: Parenchymal atrophy. No ductal dilatation or inflammation. Spleen: Normal in size without focal abnormality. Adrenals/Urinary Tract: No adrenal nodule. No hydronephrosis or perinephric edema. Homogeneous renal enhancement . Absent renal excretion on delayed phase imaging. Slight bladder wall thickening and questionable  bladder diverticulum at the dome, unchanged. Stomach/Bowel: Fluid distends the distal esophagus. Surgical clips at the gastroesophageal junction. Stomach is decompressed. Prior gastric surgery with suspected gastrojejunostomy. Decompressed small bowel without wall thickening or obstruction. Normal appendix. Small to moderate colonic  stool burden without colonic inflammation. Mild distal diverticulosis without diverticulitis. Vascular/Lymphatic: Aortic and branch atherosclerosis. No aortic aneurysm. No portal vein thrombosis. No abdominopelvic adenopathy. Reproductive: Prostate is unremarkable. Other: Moderate volume of perihepatic fluid which measures simple fluid density. Multiple foci of internal air. Ill-defined air fluid collection in the gallbladder fossa without peripherally enhancing collection. Occasional foci of free air in the upper abdomen not unexpected post recent surgery. Scattered air in the subcutaneous tissues from recent laparoscopy. Trace free fluid tracks into the pelvis. Musculoskeletal: S shaped scoliosis and multilevel degenerative change in the spine. Chronic dysplastic deformity of the left acetabulum and proximal femur. There are no acute or suspicious osseous abnormalities. IMPRESSION: 1. Interval cholecystectomy. Moderate volume of perihepatic free fluid containing foci of air with non organized fluid and air in the gallbladder fossa. Given the amount of fluid, biloma is favored over postoperative seroma. Sterility is indeterminate by imaging. Consider nuclear medicine hepatic biliary scan to assess for bile leak. 2. Plastic common bile duct stent in place. Suspected residual 9 mm stone at the proximal aspect of the stent. Common bile duct dilatation of 16 mm, with improved but persistent intrahepatic biliary dilatation on the left. Pneumobilia related to recent procedure. 3. Small right and trace left pleural effusions. 4. Absent renal excretion on delayed phase imaging, can be seen with renal dysfunction. 5. Mild distal colonic diverticulosis without diverticulitis. Aortic Atherosclerosis (ICD10-I70.0). Electronically Signed   By: Narda Rutherford M.D.   On: 03/20/2020 23:11   CT IMAGE GUIDED FLUID DRAIN BY CATHETER  Result Date: 03/21/2020 CLINICAL DATA:  Status post cholecystectomy on 04-02-2020 with  development of significant perihepatic fluid suspicious for bile leak. EXAM: CT GUIDED CATHETER DRAINAGE OF PERIHEPATIC ABSCESS ANESTHESIA/SEDATION: 1.5 mg IV Versed 75 mcg IV Fentanyl Total Moderate Sedation Time:  13 minutes The patient's level of consciousness and physiologic status were continuously monitored during the procedure by Radiology nursing. PROCEDURE: The procedure, risks, benefits, and alternatives were explained to the patient. Questions regarding the procedure were encouraged and answered. The patient understands and consents to the procedure. A time out was performed prior to initiating the procedure. CT was performed through the upper abdomen in a supine position. The right abdominal wall was prepped with chlorhexidine in a sterile fashion, and a sterile drape was applied covering the operative field. A sterile gown and sterile gloves were used for the procedure. Local anesthesia was provided with 1% Lidocaine. Under CT guidance, a 18 gauge trocar needle was advanced into the lateral perihepatic space. After return of fluid, a guidewire was advanced, tract dilatation performed over the wire and a 12 French percutaneous drainage catheter advanced over the wire. Drainage catheter position was confirmed by CT. A fluid sample was withdrawn and sent for culture analysis. The drain was connected to a suction bulb. The bulb was emptied multiple times during the procedure. The drain was secured at the skin with a Prolene retention suture and StatLock device. COMPLICATIONS: None FINDINGS: Contiguous fluid is present beginning in the subdiaphragmatic space and continuing in the lateral perihepatic space. There is a relatively small amount of fluid in the gallbladder fossa. There is a small amount of gas in the perihepatic fluid. After needle puncture and drain placement, there is return of  dark, bilious fluid. After drain placement, there is rapid drainage of bilious fluid and several hundred mL of fluid  was removed immediately after drain placement. IMPRESSION: CT-guided percutaneous catheter drainage of perihepatic fluid collection yielding dark, bilious fluid. A fluid sample was sent for culture analysis. A 12 French drain was placed in the lateral perihepatic space and attached to suction bulb drainage with rapid removal of bilious fluid. Electronically Signed   By: Irish Lack M.D.   On: 03/21/2020 11:07    Labs:  CBC: Recent Labs    03/20/20 0406 03/21/20 0455 03/22/20 0224 03/23/20 0232  WBC 19.1* 20.0* 11.3* 12.0*  HGB 13.1 12.8* 11.1* 11.8*  HCT 37.5* 39.6 32.2* 36.2*  PLT 259 254 235 326    COAGS: No results for input(s): INR, APTT in the last 8760 hours.  BMP: Recent Labs    03/21/20 0455 03/21/20 1535 03/22/20 0224 03/23/20 0232  NA 139 139 139 136  K 4.4 4.1 4.0 3.9  CL 109 108 108 105  CO2 18* 18* 21* 20*  GLUCOSE 140* 131* 106* 99  BUN 21 26* 29* 24*  CALCIUM 7.8* 7.6* 7.8* 7.7*  CREATININE 2.00* 1.63* 1.40* 0.93  GFRNONAA 35* 45* 54* >60    LIVER FUNCTION TESTS: Recent Labs    03/20/20 0406 03/21/20 0455 03/22/20 0224 03/23/20 0232  BILITOT 1.8* 1.9* 1.6* 2.1*  AST 274* 104* 57* 78*  ALT 249* 185* 105* 90*  ALKPHOS 100 89 68 73  PROT 5.9* 5.5* 4.8* 5.0*  ALBUMIN 2.4* 2.2* 1.8* 1.7*    Assessment and Plan:  Post cholecystectomy 12/16 Biloma and drain placement in IR 03/21/20 Now scheduled for GI - ERCP 12/21  Electronically Signed: Robet Leu, PA-C 03/23/2020, 1:15 PM   I spent a total of 15 Minutes at the the patient's bedside AND on the patient's hospital floor or unit, greater than 50% of which was counseling/coordinating care for biloma drain

## 2020-03-23 NOTE — Progress Notes (Signed)
ANTICOAGULATION CONSULT NOTE Pharmacy Consult for heparin Indication: atrial fibrillation  Allergies  Allergen Reactions  . Other Itching  . Demerol [Meperidine] Rash    Patient Measurements: Height: 5\' 6"  (167.6 cm) Weight: 69.9 kg (154 lb) IBW/kg (Calculated) : 63.8 Heparin Dosing Weight: 69kg  Vital Signs: Temp: 98.3 F (36.8 C) (12/20 1100) Temp Source: Oral (12/20 1100) BP: 121/65 (12/20 1100) Pulse Rate: 82 (12/20 1100)  Labs: Recent Labs    03/21/20 0455 03/21/20 1535 03/22/20 0224 03/23/20 0232 03/23/20 0950  HGB 12.8*  --  11.1* 11.8*  --   HCT 39.6  --  32.2* 36.2*  --   PLT 254  --  235 326  --   HEPARINUNFRC  --   --   --  <0.10* <0.10*  CREATININE 2.00* 1.63* 1.40* 0.93  --     Estimated Creatinine Clearance: 66.7 mL/min (by C-G formula based on SCr of 0.93 mg/dL).  Assessment: 69 y.o. male with Afib for hepain - no AC PTA.   Heparin level came back undetectable, on 1200 units/hr. Hgb 11.8, plt 326. No s/sx of bleeding or infusion issues.   Goal of Therapy:  Heparin level 0.3-0.7 units/ml Monitor platelets by anticoagulation protocol: Yes   Plan:  Heparin 2000 units IV bolus, then increase heparin to 1400 units/hr Check heparin level in 8 hours.  Monitor daily HL, CBC, and for s/sx of bleeding.   65, PharmD, BCCCP Clinical Pharmacist  Phone: [phone] 03/23/2020 12:08 PM  Please check AMION for all [facility] phone numbers After 10:00 PM, call Main Pharmacy [phone]

## 2020-03-23 NOTE — Consult Note (Addendum)
Cardiology Consultation:   Patient ID: Jorge Evans. MRN: [id number]; DOB: 03/09/1951  Admit date: 04/01/2020 Date of Consult: 03/23/2020  Primary Care Provider: Salley Scarlet, MD [facility] Cardiologist: New (Dr. Jacques Navy) [facility] Electrophysiologist:  None    Patient Profile:   Jorge Evans. is a 69 y.o. male with a history of GERD, gastric ulcers (wife reports partial gastrectomy), and anemia but no known cardiac history who is being seen today for the evaluation of atrial fibrillation with RVR at the request of Dr. Benjamine Mola.  History of Present Illness:   Jorge Evans is a 69 year old male with a history of GERD, gastric ulcers (wife reports partial gastrectomy), and anemia but no known cardiac history. Patient has never seen a Cardiologist or had any type of cardiac work-up. He has no known hypertension, hyperlipidemia, or diabetes. He used to smoke cigars but has not done so in the past 20 to 30 years. No alcohol use.  Patient is currently encephalopathic so history obtained from wife. Wife states she thinks patient's brother has a history of heart disease but she does not know what specifically.  Patient presented to the ED on 03/22/2020 for sudden onset of abdominal pain, nausea, and vomiting. LFTs were elevated and CT showed dilated intra and extrahepatic bile duct suspicious for choledocholithiasis. GI was consulted and patient underwent ERCP on 03/28/2020 which showed filling defect consistent with stone treated with placement of biliary stent. Given persistent pain an nausea after this, General Surgery was consulted and patient underwent laparoscopic cholecystectomy on 2020-04-07. Drain was then placed for bile leak on 03/21/2020. Plan is for repeat ERCP tomorrow. On 03/23/2011, patient went into atrial fibrillation with RVR. He was started on IV Cardizem and Cardiology was consulted for further evaluation.  Wife states patient has been confused and zoning out  frequently for the past couple of days.  Patient can tell me his name, date of birth, location (hospital, city, state).  However, he could not tell me what brought him into the hospital, what month it is, or who the president is.  Remainder of history obtained from wife.  Wife states patient started having abdominal pain, nausea, vomiting about 2 days prior to presentation.  However, symptoms acutely worsened on 03/11/2020 prompting [place] to be called.  Wife states patient has not complained of any cardiac symptoms including chest pain, shortness of breath, palpitations.  Wife states patient has had syncopal episodes in the setting of severe abdominal pain and "low iron." Otherwise no lightheadedness dizziness, syncope.  No recent fevers or illnesses.  Patient has had nasal congestion and cough; however, wife states this is not new. No abnormal bleeding in urine or stools.  Wife concerned about patient's breathing here in the hospital today.  Patient has a minute moments where he breathes heavily and breathing will return to normal.  I did witness these episodes.  O2 sats in the 90s on room air.  Wife states patient does have a history of snoring.  He reportedly does not sleep well at night and only sleeps during the day.  When asked why, wife states he likes to watch a lot of movies at night.  I wonder if he could have a component of sleep apnea.   Past Medical History:  Diagnosis Date   Anemia    GERD (gastroesophageal reflux disease)     Past Surgical History:  Procedure Laterality Date   BILIARY STENT PLACEMENT  03/25/2020   Procedure: BILIARY STENT  PLACEMENT;  Surgeon: Jeani Hawking, MD;  Location: [facility] ENDOSCOPY;  Service: Endoscopy;;   CHOLECYSTECTOMY N/A 03/31/2020   Procedure: LAPAROSCOPIC CHOLECYSTECTOMY;  Surgeon: Violeta Gelinas, MD;  Location: [facility] OR;  Service: General;  Laterality: N/A;   ERCP N/A 03/18/2020   Procedure: ENDOSCOPIC RETROGRADE CHOLANGIOPANCREATOGRAPHY (ERCP);  Surgeon:  Jeani Hawking, MD;  Location: [facility] ENDOSCOPY;  Service: Endoscopy;  Laterality: N/A;   LUMBAR FUSION     STOMACH SURGERY       Home Medications:  Prior to Admission medications   Medication Sig Start Date End Date Taking? Authorizing Provider  Cholecalciferol (VITAMIN D) 50 MCG (2000 UT) CAPS Take 1 capsule (2,000 Units total) by mouth daily. 01/21/20  Yes [place], Velna Hatchet, MD  ferrous sulfate 325 (65 FE) MG tablet Take 1 tablet (325 mg total) by mouth 2 (two) times daily with a meal. Patient taking differently: Take 325 mg by mouth daily with breakfast. 08/28/19  Yes [place], Velna Hatchet, MD  ondansetron (ZOFRAN) 4 MG tablet TAKE 1 TABLET BY MOUTH EVERY 8 HOURS AS NEEDED FOR NAUSEA AND VOMITING Patient taking differently: Take 4 mg by mouth every 8 (eight) hours as needed for nausea or vomiting. 01/21/20  Yes [place], Velna Hatchet, MD  sucralfate (CARAFATE) 1 g tablet TAKE 1 TABLET (1 G TOTAL) BY MOUTH 4 (FOUR) TIMES DAILY - WITH MEALS AND AT BEDTIME. 12/30/19  Yes [place], Velna Hatchet, MD    Inpatient Medications: Scheduled Meds:  acetaminophen  1,000 mg Oral Q8H   lidocaine  1 patch Transdermal Q24H   lidocaine  1 application Urethral Once   metoprolol tartrate  25 mg Oral BID   mupirocin ointment  1 application Nasal BID   pantoprazole (PROTONIX) IV  40 mg Intravenous Q12H   senna-docusate  2 tablet Oral BID   sucralfate  1 g Oral TID WC & HS   Continuous Infusions:  diltiazem (CARDIZEM) infusion 15 mg/hr (03/23/20 1008)   heparin 1,400 Units/hr (03/23/20 1214)   lactated ringers Stopped (03/22/20 2019)   piperacillin-tazobactam (ZOSYN)  IV 3.375 g (03/23/20 1207)   PRN Meds: acetaminophen **OR** acetaminophen, dicyclomine, metoCLOPramide (REGLAN) injection  Allergies:    Allergies  Allergen Reactions   Other Itching   Demerol [Meperidine] Rash    Social History:   Social History   Socioeconomic History   Marital status: Married    Spouse name: Jorge Evans    Number of children: 1   Years of education: 12 +   Highest education level: Not on file  Occupational History   Occupation: Retired  Tobacco Use   Smoking status: Former Smoker    Quit date: 04/04/1998    Years since quitting: 21.9   Smokeless tobacco: Never Used  Building services engineer Use: Never used  Substance and Sexual Activity   Alcohol use: No   Drug use: No   Sexual activity: Not Currently  Other Topics Concern   Not on file  Social History Narrative   Lives w/ wife   Caffeine use: 2 cups coffee per week   Social Determinants of Corporate investment banker Strain: Not on file  Food Insecurity: Not on file  Transportation Needs: Not on file  Physical Activity: Not on file  Stress: Not on file  Social Connections: Not on file  Intimate Partner Violence: Not on file    Family History:    Family History  Problem Relation Age of Onset   Heart attack Mother    Emphysema Father  Cancer Father        lung     ROS:  Please see the history of present illness.  ROS limited due to patient's altered mental status.    Physical Exam/Data:   Vitals:   03/23/20 0400 03/23/20 0700 03/23/20 1009 03/23/20 1100  BP: 100/60 131/67 111/67 121/65  Pulse: 83 (!) 112 94 82  Resp: 12 (!) 24  (!) 25  Temp: 98 F (36.7 C) 98.2 F (36.8 C)  98.3 F (36.8 C)  TempSrc: Oral Oral  Oral  SpO2: 95% 93%  94%  Weight:      Height:        Intake/Output Summary (Last 24 hours) at 03/23/2020 1504 Last data filed at 03/23/2020 1200 Gross per 24 hour  Intake 854.88 ml  Output 1065 ml  Net -210.12 ml   Last 3 Weights 03/11/2020 11/19/2019 08/23/2019  Weight (lbs) 154 lb 155 lb 161 lb  Weight (kg) 69.854 kg 70.308 kg 73.029 kg     Body mass index is 24.86 kg/m.  General: 69 y.o. male resting comfortably in no acute distress.  HEENT: Normocephalic and atraumatic. Sclera clear.  Neck: Supple. No JVD. Heart: Borderline tachycardic with irregularly irregular rhythm.  Distinct S1 and S2. No murmurs, gallops, or rubs.  Lungs: Intermittent increased work of breathing. Diminished breath sounds in bases. No significant crackles appreciated.  Abdomen: Soft, mildly distended, and non-tender to palpation. Drain in right upper quadrant with bilious fluid. Extremities: No lower extremity edema.    Skin: Warm and dry. Neuro: Alert but not fully oriented. Oriented to name, date of birth, and locations (hsopital setting, city, state) but not reason for admission, month, or president. No focal deficits. Psych: Disoriented.   EKG:  The EKG was personally reviewed and demonstrates: Atrial fibrillation, rate 162 bpm, with RBBB and diffuse ST depressions in anterolateral leads as well as T wave inversion in V1-V3.   Telemetry:  Telemetry was personally reviewed and demonstrates: Atrial fibrillation with rates currently in the 80's to low 100's.  Relevant CV Studies:  Echo pending.  Laboratory Data:  High Sensitivity Troponin:  No results for input(s): TROPONINIHS in the last 720 hours.   Chemistry Recent Labs  Lab 03/21/20 1535 03/22/20 0224 03/23/20 0232  NA 139 139 136  K 4.1 4.0 3.9  CL 108 108 105  CO2 18* 21* 20*  GLUCOSE 131* 106* 99  BUN 26* 29* 24*  CREATININE 1.63* 1.40* 0.93  CALCIUM 7.6* 7.8* 7.7*  GFRNONAA 45* 54* >60  ANIONGAP 13 10 11     Recent Labs  Lab 03/21/20 0455 03/22/20 0224 03/23/20 0232  PROT 5.5* 4.8* 5.0*  ALBUMIN 2.2* 1.8* 1.7*  AST 104* 57* 78*  ALT 185* 105* 90*  ALKPHOS 89 68 73  BILITOT 1.9* 1.6* 2.1*   Hematology Recent Labs  Lab 03/21/20 0455 03/22/20 0224 03/23/20 0232  WBC 20.0* 11.3* 12.0*  RBC 4.45 3.71* 4.13*  HGB 12.8* 11.1* 11.8*  HCT 39.6 32.2* 36.2*  MCV 89.0 86.8 87.7  MCH 28.8 29.9 28.6  MCHC 32.3 34.5 32.6  RDW 13.2 13.2 13.3  PLT 254 235 326   BNPNo results for input(s): BNP, PROBNP in the last 168 hours.  DDimer No results for input(s): DDIMER in the last 168  hours.   Radiology/Studies:  CT ABDOMEN PELVIS W CONTRAST  Result Date: 03/20/2020 CLINICAL DATA:  Umbilical pain after laparoscopic cholecystectomy yesterday. EXAM: CT ABDOMEN AND PELVIS WITH CONTRAST TECHNIQUE: Multidetector CT imaging of the abdomen  and pelvis was performed using the standard protocol following bolus administration of intravenous contrast. CONTRAST:  OMNIPAQUE IOHEXOL 300 MG/ML  SOLN COMPARISON:  CT 03/04/2020 FINDINGS: Lower chest: Small right and trace left pleural effusion. Associated compressive atelectasis. There are coronary artery calcifications. Fluid distends the distal esophagus. Hepatobiliary: Interval cholecystectomy with clips in the gallbladder fossa. Persistent but diminished biliary dilatation in the left lobe of the liver with new pneumobilia, likely sequela of ERCP. Plastic common bile duct stent. Common bile duct proximal to the stent measures 18 mm. Rounded density in the common bile duct measuring 9 mm may represent a retained stone at the proximal aspect of the stent, series 6, image 77. No focal hepatic lesion. Moderate volume perihepatic free fluid containing foci of air with non organized fluid and air in the gallbladder fossa. Pancreas: Parenchymal atrophy. No ductal dilatation or inflammation. Spleen: Normal in size without focal abnormality. Adrenals/Urinary Tract: No adrenal nodule. No hydronephrosis or perinephric edema. Homogeneous renal enhancement . Absent renal excretion on delayed phase imaging. Slight bladder wall thickening and questionable bladder diverticulum at the dome, unchanged. Stomach/Bowel: Fluid distends the distal esophagus. Surgical clips at the gastroesophageal junction. Stomach is decompressed. Prior gastric surgery with suspected gastrojejunostomy. Decompressed small bowel without wall thickening or obstruction. Normal appendix. Small to moderate colonic stool burden without colonic inflammation. Mild distal diverticulosis without  diverticulitis. Vascular/Lymphatic: Aortic and branch atherosclerosis. No aortic aneurysm. No portal vein thrombosis. No abdominopelvic adenopathy. Reproductive: Prostate is unremarkable. Other: Moderate volume of perihepatic fluid which measures simple fluid density. Multiple foci of internal air. Ill-defined air fluid collection in the gallbladder fossa without peripherally enhancing collection. Occasional foci of free air in the upper abdomen not unexpected post recent surgery. Scattered air in the subcutaneous tissues from recent laparoscopy. Trace free fluid tracks into the pelvis. Musculoskeletal: S shaped scoliosis and multilevel degenerative change in the spine. Chronic dysplastic deformity of the left acetabulum and proximal femur. There are no acute or suspicious osseous abnormalities. IMPRESSION: 1. Interval cholecystectomy. Moderate volume of perihepatic free fluid containing foci of air with non organized fluid and air in the gallbladder fossa. Given the amount of fluid, biloma is favored over postoperative seroma. Sterility is indeterminate by imaging. Consider nuclear medicine hepatic biliary scan to assess for bile leak. 2. Plastic common bile duct stent in place. Suspected residual 9 mm stone at the proximal aspect of the stent. Common bile duct dilatation of 16 mm, with improved but persistent intrahepatic biliary dilatation on the left. Pneumobilia related to recent procedure. 3. Small right and trace left pleural effusions. 4. Absent renal excretion on delayed phase imaging, can be seen with renal dysfunction. 5. Mild distal colonic diverticulosis without diverticulitis. Aortic Atherosclerosis (ICD10-I70.0). Electronically Signed   By: Narda Rutherford M.D.   On: 03/20/2020 23:11   CT IMAGE GUIDED FLUID DRAIN BY CATHETER  Result Date: 03/21/2020 CLINICAL DATA:  Status post cholecystectomy on 03/04/2020 with development of significant perihepatic fluid suspicious for bile leak. EXAM: CT  GUIDED CATHETER DRAINAGE OF PERIHEPATIC ABSCESS ANESTHESIA/SEDATION: 1.5 mg IV Versed 75 mcg IV Fentanyl Total Moderate Sedation Time:  13 minutes The patient's level of consciousness and physiologic status were continuously monitored during the procedure by Radiology nursing. PROCEDURE: The procedure, risks, benefits, and alternatives were explained to the patient. Questions regarding the procedure were encouraged and answered. The patient understands and consents to the procedure. A time out was performed prior to initiating the procedure. CT was performed through the upper abdomen in  a supine position. The right abdominal wall was prepped with chlorhexidine in a sterile fashion, and a sterile drape was applied covering the operative field. A sterile gown and sterile gloves were used for the procedure. Local anesthesia was provided with 1% Lidocaine. Under CT guidance, a 18 gauge trocar needle was advanced into the lateral perihepatic space. After return of fluid, a guidewire was advanced, tract dilatation performed over the wire and a 12 French percutaneous drainage catheter advanced over the wire. Drainage catheter position was confirmed by CT. A fluid sample was withdrawn and sent for culture analysis. The drain was connected to a suction bulb. The bulb was emptied multiple times during the procedure. The drain was secured at the skin with a Prolene retention suture and StatLock device. COMPLICATIONS: None FINDINGS: Contiguous fluid is present beginning in the subdiaphragmatic space and continuing in the lateral perihepatic space. There is a relatively small amount of fluid in the gallbladder fossa. There is a small amount of gas in the perihepatic fluid. After needle puncture and drain placement, there is return of dark, bilious fluid. After drain placement, there is rapid drainage of bilious fluid and several hundred mL of fluid was removed immediately after drain placement. IMPRESSION: CT-guided percutaneous  catheter drainage of perihepatic fluid collection yielding dark, bilious fluid. A fluid sample was sent for culture analysis. A 12 French drain was placed in the lateral perihepatic space and attached to suction bulb drainage with rapid removal of bilious fluid. Electronically Signed   By: Irish Lack M.D.   On: 03/21/2020 11:07     Assessment and Plan:   New Onset Atrial Fibrillation with RVR - In the setting of choledocholithiasis and recent cholecystectomy on 03/30/2020. - Rates reasonably well controlled in the 80's to low 100's.  - Potassium 3.9 today.  - Magnesium 1.9 yesterday. - TSH normal.  - Echo pending.  - Continue IV Cardizem and Lopressor 25mg  twice daily. Continue IV Cardizem through ERCP tomorrow. Can continue to up Lopressor as needed. - Suspect rates to improve as GI issues are addressed. - CHA2DS2-VASc = 1 (age). Patient currently on IV Heparin. No strong indication to continue Heparin given CHA2DS2-VASc score. Could consider outpatient monitor to assess atrial fibrillation burden.  CHA2DS2-VASc Score = 2  This indicates a 0.6% annual risk of stroke. The patient's score is based upon: CHF History: No HTN History: No Diabetes History: No Stroke History: No Vascular Disease History: Aortic atherosclerosis - 1 Age Score: 1 Gender Score: 0  Dyspnea - Patient wife is concerned about patient's breathing today. He will intermittent have increased work of breathing and then it returns to normal. I did witness this. Wife is worried this may be due to patient's heart. - Echo pending. He does not look volume overloaded on exam. - Will check chest x-ray and BNP.  - Wife does report snoring and that patient doesn't sleep well at night. Wonder if patient could have sleep apnea given body habitus. Could consider outpatient sleep study.   Otherwise, per primary team: - Choledocholithiasis: s/p ERCP on 03/20/2020, laparoscopic cholecystectomy on 03/30/2020, drain placement on  03/21/2020. Plan is for repeat ERCP 04/03/2020.  - GERD - Iron deficiency anemia - Altered mental status  For questions or updates, please contact [facility] Please consult [email or web address] for contact info under    Signed, 03/11/2020, PA-C  03/23/2020 3:04 PM  Patient seen and examined with 03/25/2020, PA C.  Agree as above, with the following exceptions and changes  as noted below. Mr. Russett appears to be uncomfortable with hiccups. He is altered at the moment, and communication and history are primarily obtained from his wife.. Gen: NAD, CV: Irregular rhythm normal rate, no murmurs, Lungs: clear, Abd: soft, Extrem: Warm, well perfused, no edema, Neuro/Psych: alert and oriented x 3, normal mood and affect. All available labs, radiology testing, previous records reviewed. We reviewed that his rates are better controlled on oral metoprolol and IV diltiazem. I have briefly reviewed the images of his echocardiogram with his wife at the bedside, EF looks grossly preserved. Will await formal read. Chest x-ray shows bibasilar atelectasis and poor characterization of the previously noted pleural effusion.  Would recommend continuing IV diltiazem through his ERCP tomorrow in the event he has periprocedural need for rate control. Agree with the use of Lopressor 25 mg twice daily, and to facilitate weaning of the diltiazem drip, would recommend increasing Lopressor dosage. This can likely be performed after ERCP tomorrow if rates remain as stable as they appear now.  Patient has a CHA2DS2-VASc score of 2 based on age and evidence of atherosclerosis in the aorta from a CT in 2018. At this time it would be reasonable to continue heparin IV if there are no signs of bleeding. This can be discussed further with the wife and the patient assess clinical status improves.  Parke Poisson, MD 03/23/20 4:45 PM

## 2020-03-24 ENCOUNTER — Inpatient Hospital Stay (HOSPITAL_COMMUNITY): Payer: Medicare Other

## 2020-03-24 ENCOUNTER — Inpatient Hospital Stay (HOSPITAL_COMMUNITY): Payer: Medicare Other | Admitting: Anesthesiology

## 2020-03-24 ENCOUNTER — Encounter (HOSPITAL_COMMUNITY): Payer: Self-pay | Admitting: Internal Medicine

## 2020-03-24 ENCOUNTER — Encounter (HOSPITAL_COMMUNITY): Admission: EM | Disposition: E | Payer: Self-pay | Source: Home / Self Care | Attending: Internal Medicine

## 2020-03-24 DIAGNOSIS — K2970 Gastritis, unspecified, without bleeding: Secondary | ICD-10-CM

## 2020-03-24 DIAGNOSIS — K838 Other specified diseases of biliary tract: Secondary | ICD-10-CM

## 2020-03-24 DIAGNOSIS — Z4659 Encounter for fitting and adjustment of other gastrointestinal appliance and device: Secondary | ICD-10-CM

## 2020-03-24 HISTORY — PX: PANCREATIC STENT PLACEMENT: SHX5539

## 2020-03-24 HISTORY — PX: REMOVAL OF STONES: SHX5545

## 2020-03-24 HISTORY — PX: LITHOTRIPSY: SHX5546

## 2020-03-24 HISTORY — PX: SUBMUCOSAL TATTOO INJECTION: SHX6856

## 2020-03-24 HISTORY — PX: BILIARY STENT PLACEMENT: SHX5538

## 2020-03-24 HISTORY — PX: BILIARY DILATION: SHX6850

## 2020-03-24 HISTORY — PX: STENT REMOVAL: SHX6421

## 2020-03-24 HISTORY — PX: SPHINCTEROTOMY: SHX5279

## 2020-03-24 HISTORY — PX: ERCP: SHX5425

## 2020-03-24 HISTORY — PX: BIOPSY: SHX5522

## 2020-03-24 LAB — CBC
HCT: 34.3 % — ABNORMAL LOW (ref 39.0–52.0)
Hemoglobin: 11.4 g/dL — ABNORMAL LOW (ref 13.0–17.0)
MCH: 28.6 pg (ref 26.0–34.0)
MCHC: 33.2 g/dL (ref 30.0–36.0)
MCV: 86 fL (ref 80.0–100.0)
Platelets: 347 10*3/uL (ref 150–400)
RBC: 3.99 MIL/uL — ABNORMAL LOW (ref 4.22–5.81)
RDW: 13.3 % (ref 11.5–15.5)
WBC: 15.2 10*3/uL — ABNORMAL HIGH (ref 4.0–10.5)
nRBC: 0 % (ref 0.0–0.2)

## 2020-03-24 LAB — HEPARIN LEVEL (UNFRACTIONATED): Heparin Unfractionated: <0.1 IU/mL — ABNORMAL LOW (ref 0.30–0.70)

## 2020-03-24 SURGERY — ERCP, WITH INTERVENTION IF INDICATED
Anesthesia: General

## 2020-03-24 MED ORDER — PHENYLEPHRINE 40 MCG/ML (10ML) SYRINGE FOR IV PUSH (FOR BLOOD PRESSURE SUPPORT)
PREFILLED_SYRINGE | INTRAVENOUS | Status: DC | PRN
  Administered 2020-03-24: 120 ug via INTRAVENOUS

## 2020-03-24 MED ORDER — SPOT INK MARKER SYRINGE KIT
PACK | SUBMUCOSAL | Status: DC | PRN
  Administered 2020-03-24: 1 mL via SUBMUCOSAL

## 2020-03-24 MED ORDER — PROPOFOL 10 MG/ML IV BOLUS
INTRAVENOUS | Status: DC | PRN
  Administered 2020-03-24: 100 mg via INTRAVENOUS

## 2020-03-24 MED ORDER — PHENYLEPHRINE HCL-NACL 10-0.9 MG/250ML-% IV SOLN
INTRAVENOUS | Status: DC | PRN
  Administered 2020-03-24: 25 ug/min via INTRAVENOUS

## 2020-03-24 MED ORDER — INDOMETHACIN 50 MG RE SUPP
RECTAL | Status: AC
  Filled 2020-03-24: qty 2

## 2020-03-24 MED ORDER — ROCURONIUM BROMIDE 10 MG/ML (PF) SYRINGE
PREFILLED_SYRINGE | INTRAVENOUS | Status: DC | PRN
  Administered 2020-03-24: 30 mg via INTRAVENOUS
  Administered 2020-03-24: 20 mg via INTRAVENOUS

## 2020-03-24 MED ORDER — SUGAMMADEX SODIUM 200 MG/2ML IV SOLN
INTRAVENOUS | Status: DC | PRN
  Administered 2020-03-24: 150 mg via INTRAVENOUS

## 2020-03-24 MED ORDER — GLUCAGON HCL RDNA (DIAGNOSTIC) 1 MG IJ SOLR
INTRAMUSCULAR | Status: AC
  Filled 2020-03-24: qty 1

## 2020-03-24 MED ORDER — LIDOCAINE 2% (20 MG/ML) 5 ML SYRINGE
INTRAMUSCULAR | Status: DC | PRN
  Administered 2020-03-24: 60 mg via INTRAVENOUS

## 2020-03-24 MED ORDER — SPOT INK MARKER SYRINGE KIT
PACK | SUBMUCOSAL | Status: AC
  Filled 2020-03-24: qty 5

## 2020-03-24 MED ORDER — CIPROFLOXACIN IN D5W 400 MG/200ML IV SOLN
INTRAVENOUS | Status: AC
  Filled 2020-03-24: qty 200

## 2020-03-24 MED ORDER — FAMOTIDINE IN NACL 20-0.9 MG/50ML-% IV SOLN
20.0000 mg | Freq: Once | INTRAVENOUS | Status: DC
  Filled 2020-03-24: qty 50

## 2020-03-24 MED ORDER — ALBUMIN HUMAN 5 % IV SOLN: INTRAVENOUS | Status: DC | PRN

## 2020-03-24 MED ORDER — GLUCAGON HCL RDNA (DIAGNOSTIC) 1 MG IJ SOLR
INTRAMUSCULAR | Status: DC | PRN
  Administered 2020-03-24 (×5): .25 mg via INTRAVENOUS

## 2020-03-24 MED ORDER — EPHEDRINE SULFATE-NACL 50-0.9 MG/10ML-% IV SOSY
PREFILLED_SYRINGE | INTRAVENOUS | Status: DC | PRN
  Administered 2020-03-24: 10 mg via INTRAVENOUS

## 2020-03-24 MED ORDER — DEXAMETHASONE SODIUM PHOSPHATE 10 MG/ML IJ SOLN
INTRAMUSCULAR | Status: DC | PRN
  Administered 2020-03-24: 4 mg via INTRAVENOUS

## 2020-03-24 MED ORDER — ONDANSETRON HCL 4 MG/2ML IJ SOLN
INTRAMUSCULAR | Status: DC | PRN
  Administered 2020-03-24: 4 mg via INTRAVENOUS

## 2020-03-24 MED ORDER — SUCCINYLCHOLINE CHLORIDE 200 MG/10ML IV SOSY
PREFILLED_SYRINGE | INTRAVENOUS | Status: DC | PRN
  Administered 2020-03-24: 100 mg via INTRAVENOUS

## 2020-03-24 MED ORDER — FUROSEMIDE 10 MG/ML IJ SOLN
40.0000 mg | Freq: Once | INTRAMUSCULAR | Status: AC
  Administered 2020-03-24: 22:00:00 40 mg via INTRAVENOUS
  Filled 2020-03-24: qty 4

## 2020-03-24 MED ORDER — INDOMETHACIN 50 MG RE SUPP
RECTAL | Status: DC | PRN
  Administered 2020-03-24: 100 mg via RECTAL

## 2020-03-24 MED ORDER — SODIUM CHLORIDE 0.9 % IV SOLN
INTRAVENOUS | Status: DC | PRN
  Administered 2020-03-24: 13:00:00 100 mL

## 2020-03-24 MED ORDER — HEPARIN (PORCINE) 25000 UT/250ML-% IV SOLN
2700.0000 [IU]/h | INTRAVENOUS | Status: DC
  Administered 2020-03-24 (×2): 1600 [IU]/h via INTRAVENOUS
  Administered 2020-03-25: 16:00:00 1850 [IU]/h via INTRAVENOUS
  Administered 2020-03-26 – 2020-03-27 (×4): 2350 [IU]/h via INTRAVENOUS
  Administered 2020-03-28: 15:00:00 2500 [IU]/h via INTRAVENOUS
  Administered 2020-03-28: 05:00:00 2350 [IU]/h via INTRAVENOUS
  Administered 2020-03-29 (×2): 2550 [IU]/h via INTRAVENOUS
  Administered 2020-03-30: 14:00:00 2700 [IU]/h via INTRAVENOUS
  Administered 2020-03-30: 06:00:00 2550 [IU]/h via INTRAVENOUS
  Filled 2020-03-24 (×13): qty 250

## 2020-03-24 NOTE — Op Note (Addendum)
[facility] Patient Name: Jorge Evans Procedure Date : 03/06/2020 MRN: [id number] Attending MD: Justice Britain , MD Date of Birth: Mar 14, 1951 CSN: [id number] Age: 69 Admit Type: Inpatient Procedure:                ERCP Indications:              Bile duct stone(s), Bile leak, Generalized                            abdominal pain, Elevated liver enzymes Providers:                Justice Britain, MD, Josie Dixon, RN, Ladona Ridgel, Technician, [employer] CRNA Referring MD:             Carol Ada, MD, [facility] Medicines:                General Anesthesia, Zosyn 3.375 g, Indomethacin 100                            mg PR, Glucagon 1 mg IV Complications:            No immediate complications. Estimated Blood Loss:     Estimated blood loss was minimal. Procedure:                Pre-Anesthesia Assessment:                           - Prior to the procedure, a History and Physical                            was performed, and patient medications and                            allergies were reviewed. The patient's tolerance of                            previous anesthesia was also reviewed. The risks                            and benefits of the procedure and the sedation                            options and risks were discussed with the patient.                            All questions were answered, and informed consent                            was obtained. Prior Anticoagulants: The patient has                            taken heparin, last dose was day of procedure. ASA  Grade Assessment: III - A patient with severe                            systemic disease. After reviewing the risks and                            benefits, the patient was deemed in satisfactory                            condition to undergo the procedure.                           After obtaining informed consent, the  scope was                            passed under direct vision. Throughout the                            procedure, the patient's blood pressure, pulse, and                            oxygen saturations were monitored continuously. The                            [id number]-Q180V ([id number] Olympus duodenoscope was                            introduced through the mouth, and used to inject                            contrast into and used to inject contrast into the                            bile duct and ventral pancreatic duct. The                            [id number]-[id number] ([id number]) Olympus therapeutic                            gastroscope was introduced through the mouth, and                            used to inject contrast into and used to inject                            contrast into the bile duct. The ERCP was                            technically difficult and complex due to                            post-surgical anatomy, challenging cannulation  because of abnormal anatomy and significant                            looping. Successful completion of the procedure was                            aided by performing the maneuvers documented                            (below) in this report. The patient tolerated the                            procedure. Scope In: Scope Out: Findings:      A scout film of the abdomen was obtained. A plastic biliary stent was in       place. Surgical clips, consistent with a previous cholecystectomy, were       seen in the area of the right upper quadrant of the abdomen. A       percutaneous biliary drain was in place as well.      The esophagus was successfully intubated under direct vision without       detailed examination of the pharynx, larynx, and associated structures.       A standard esophagogastroduodenoscopy scope was used for the examination       of the upper gastrointestinal tract. The scope was passed under direct        vision through the upper GI tract. LA Grade D (one or more mucosal       breaks involving at least 75% of esophageal circumference) esophagitis       with bleeding was found in the entire esophagus - this was biopsied. A 4       cm hiatal hernia was present. Diffuse moderate inflammation       characterized by erosions, erythema, friability and granularity was       found in the entire examined stomach - this was biopsied. Evidence of a       patent Billroth II gastrojejunostomy was found. The gastrojejunal       anastomosis was characterized by friable mucosa. This was traversed. The       efferent limb was examined. The afferent limb was examined and able to       visualize the ampulla. The afferent limb was tattooed for demarcation       purposes to allow for better viewing with the upcoming use of the ERCP       scope. One plastic biliary stent originating in the biliary tree was       emerging from the major papilla. The stent was visibly patent.      A long 0.035 inch Soft Jagwire was passed into the biliary tree along       the previously placed biliary stent to ensure that we could reaccess the       biliary tree. The Billroth II sphincterotome was passed over the       guidewire and the bile duct was then deeply cannulated. I initially       tried to use the Billroth sphincterotome in effort of making a       sphincterotomy and started in a downward fashion but upon full bowing of       the sphincterotome it  did not angle in the appropriate position of 5       o'clock. I removed the wire. I switched over to the needle knife. A       biliary pre-cut sphincterotomy measuring 6 mm in length was made with a       monofilament needle knife over the biliary stent using ERBE       electrocautery. There was no post-sphincterotomy bleeding.      Repeated attempts at biliary cannulation were not successful while using       a wire-guided approach even though I had already made the        sphincterotomy. This led to placement of the wire within the pancreatic       duct. Decision was made to pursue a double-wire approach. The short       0.035 inch Soft Jagwire was passed into the ventral pancreatic duct and       kept in place.      Subsequently, a short 0.035 inch Soft Jagwire was passed into the       biliary tree. Using the normal autotome, contrast was injected. I       personally interpreted the bile duct images. Ductal flow of contrast was       adequate. Image quality was adequate. Contrast extended to the hepatic       ducts. Opacification of the entire biliary tree except for the cystic       duct and gallbladder was done. The main bile duct was moderately dilated       and diffusely dilated. The largest diameter was 13 mm. The main bile       duct contained filling defects thought to be stones and sludge.Dilation       as a sphincteroplasty with the Hurricane 6 mm balloon was done for 1       minute. Then I transitioned to the CRE balloon and proceeded with a       sphincteroplasty for a total of 4 minutes using an 11-10-08 mm balloon (to       a maximum balloon size of 10 mm). To discover objects, the biliary tree       was swept with a retrieval balloon starting at the bifurcation. Sludge       was swept from the duct. Many stones were removed (hard and soft). One       large stone remained near the CHD. Initial attempts at balloon trawl       were not successful with moving the stone out of position.      I lost position and both wires fell out as the endoscope moved backwards       Over the next 20 minutes I had difficulty with reintubating and gaining       stable position of the ampulla. I finally was able to get back in a       semi-stable position and then placed a short 0.035 Soft Jagwire       initially in attempt to the biliary orifice but it went in to the       pancreatic orifice so it was left in place. A short 0.035 Soft Jagwire       was then  placed into the biliary tree. Lithotripsy with a basket-type       device was successful after using the 1.5 cm and then 2 cm basket       partially. To discover  objects, the biliary tree was swept with a       retrieval balloon starting at the bifurcation. Sludge was swept from the       duct. Multiple fragments of the stone were removed. One large stone       fragment remained. Due to the time taken today and the unstable       position, it was decided that repeat attempt at ERCP will need to be       performed in the coming weeks with ability to do cholangioscopy. To       ensure adequate drainage, one 10 Fr by 5 cm plastic biliary stent with a       single external flap and a single internal flap was placed into the       common bile duct. Bile and sludge flowed through the stent. The stent       was in good position.      One 4 Fr by 7 cm plastic pancreatic stent with a single external pigtail       was placed into the ventral pancreatic duct. Clear fluid flowed through       the stent. The stent was in good position. A pancreatogram was not       performed.      The duodenoscope was withdrawn from the patient. Impression:               - LA Grade D esophagitis with bleeding throughout -                            biopsied.                           - 4 cm hiatal hernia.                           - Gastritis - biopsied.                           - Patent Billroth II gastrojejunostomy was found,                            characterized by friable mucosa at the anastomosis.                           - Efferent limb intubated and normal.                           - Afferent limb intubated and normal with                            visualization of ampulla and minor ampulla.                            Tattooed proximal aspect for demarcation purposes                            and future procedures..                           -  One visibly patent stent from the biliary tree                             was seen in the major papilla.                           - Filling defects consistent with stones and sludge                            were seen on the cholangiogram.                           - The entire main bile duct was moderately dilated                            as a result of the above.                           - Choledocholithiasis and biliary sludge was found.                            Partial removal was accomplished with biliary                            precut sphincterotomy and subsequent balloon                            sphincteroplasty and subsequent balloown trawl.                            Further stone material required mechnical                            lithotripsy which was partially successful.                            Eventually a stent was inserted due to known                            biliary leak into the bile duct.                           - One plastic pancreatic stent was placed into the                            ventral pancreatic duct. Recommendation:           - The patient will be observed post-procedure,                            until all discharge criteria are met.                           - Return patient to hospital ward for ongoing care.                           -  Advance diet as tolerated.                           - Check liver enzymes (AST, ALT, alkaline                            phosphatase, bilirubin) in the morning.                           - Observe patient's clinical course.                           - Await path results.                           - Continue Protonix 40 mg IV BID for now until able                            to tolerate oral intake. Continue Carafate as                            prescribed for now.                           - Observe patient's clinical course.                           - Ideally would be off any anticoagulation for next                            48 hours. But in this  setting needs are such that I                            would restart heparin _0  (6 hours from                            completion of procedure) with no bolus and titrate                            and monitor hemoglobin.                           - Watch for pancreatitis, bleeding, perforation,                            and cholangitis.                           - Repeat ERCP in 2 months to exchange stent.                           - At this point, we would hope that the biliary                            leak would decrease in its output  if it is truly                            from the cystic duct region - although today I                            could not get a good occlusion cholangiogram. There                            does remain the risk of aberrant anatomy that could                            have led to a Duct of Lushka leak though not noted                            on Operative report. Would recommend we see how                            patient does over next few days in regards to drain                            output and repeat cross-sectional imaging before                            PTC, but if absolutely necessary could be                            considered.                           - I will be away for the next week. Dr. Benson Norway and I                            will discuss who performs repeat ERCP in future.                           - The findings and recommendations were discussed                            with the patient.                           - The findings and recommendations were discussed                            with the patient's family.                           - The findings and recommendations were discussed                            with the referring physician. Procedure Code(s):        --- Professional ---  80321, Endoscopic retrograde                            cholangiopancreatography (ERCP); with  removal and                            exchange of stent(s), biliary or pancreatic duct,                            including pre- and post-dilation and guide wire                            passage, when performed, including sphincterotomy,                            when performed, each stent exchanged                           43265, Endoscopic retrograde                            cholangiopancreatography (ERCP); with destruction                            of calculi, any method (eg, mechanical,                            electrohydraulic, lithotripsy) Diagnosis Code(s):        --- Professional ---                           K20.91, Esophagitis, unspecified with bleeding                           K44.9, Diaphragmatic hernia without obstruction or                            gangrene                           K29.70, Gastritis, unspecified, without bleeding                           Z98.0, Intestinal bypass and anastomosis status                           Z96.89, Presence of other specified functional                            implants                           K83.8, Other specified diseases of biliary tract                           K80.50, Calculus of bile duct without cholangitis  or cholecystitis without obstruction                           R10.84, Generalized abdominal pain                           R74.8, Abnormal levels of other serum enzymes                           R93.2, Abnormal findings on diagnostic imaging of                            liver and biliary tract CPT copyright 2019 [employer]. All rights reserved. The codes documented in this report are preliminary and upon coder review may  be revised to meet current compliance requirements. Justice Britain, MD 03/20/2020 1:57:16 PM Number of Addenda: 0

## 2020-03-24 NOTE — Progress Notes (Signed)
Progress Note    Jorge Piaymond N Krasner Jr.  WUJ:[id number] DOB: 1950/08/11  DOA: 03/30/2020 PCP: Salley Scarleturham, Kawanta F, MD    Brief Narrative:     Medical records reviewed and are as summarized below:  Jorge Piaymond N Kuipers Jr. is an 69 y.o. male with past medical history significant for GERD who presented with progressive abdominal pain.  Noted to have elevated transaminitis and total bilirubin.  CT abdomen/pelvis with IV contrast with intra-/extrahepatic biliary duct dilation with filling defects within the common duct concerning for choledocholithiasis.  Underwent ERCP on 03/17/2020, 3 large common bile duct stones noted, unable to perform a sphincterotomy and stone extraction due to anatomy s/p Billroth II; and had stent placement. 12/16: cholecystectomy.  Drain placed for bile leak on 12/18.  ERCP planned for 21st  Assessment/Plan:   Principal Problem:   Choledocholithiasis Active Problems:   GERD (gastroesophageal reflux disease)   Iron deficiency anemia   Elevated LFTs   Atrial fibrillation with RVR (HCC)   A fib with RVR- no prior h/o: CHadVAsc2: 2 (age/PVD) -TSH ok Echo unrevealing -IV cardizem titrate for HR 80-100 -add PO BB -heparin gtt -cards consult  Choledocholithiasis --Gastroenterology, Dr. Elnoria HowardHung --Zosyn --s/p cholecystectomy on 12/16 --Pain control and antiemetics -ambulate -ERCP planned for 12/21 by Dr. Meridee ScoreMansouraty    hypokalemia -repleted  GERD --Carafate 1 g 4 times daily --Protonix 40 mg IV  Iron deficiency anemia -Fe    Family Communication/Anticipated D/C date and plan/Code Status   DVT prophylaxis: heparin gtt Code Status: Full Code.  Disposition Plan: Status is: Inpatient Wife at bedside 12/21 Remains inpatient appropriate because:Inpatient level of care appropriate due to severity of illness   Dispo:  Patient From: Home  Planned Disposition: Home  Expected discharge date: 03/20/2020  Medically stable for discharge: No           Medical Consultants:    GI  GS  cards  Subjective:   C/o GER/burning  Objective:    Vitals:   03/27/20 0300 03/27/20 0738 03/27/20 0951 03/27/20 1007  BP: 112/68 115/74  139/65  Pulse: 84 97 (!) 105 (!) 115  Resp: 20 (!) 25  (!) 25  Temp: 98.4 F (36.9 C) 98.4 F (36.9 C)  98.8 F (37.1 C)  TempSrc: Oral Oral  Temporal  SpO2: 95% 90%  94%  Weight:      Height:        Intake/Output Summary (Last 24 hours) at 11-13-2019 1134 Last data filed at 11-13-2019 1126 Gross per 24 hour  Intake 2809.27 ml  Output 845 ml  Net 1964.27 ml   Filed Weights   03/21/2020 1442  Weight: 69.9 kg    Exam:   General: Appearance:    Chronically ill appearing male in no acute distress     Lungs:      respirations unlabored  Heart:    Tachycardic. irr No murmurs, rubs, or gallops.   MS:   All extremities are intact.   Neurologic:   Awake, alert, cooperative     Data Reviewed:   I have personally reviewed following labs and imaging studies:  Labs: Labs show the following:   Basic Metabolic Panel: Recent Labs  Lab 03/20/20 0406 03/21/20 0455 03/21/20 1535 03/22/20 0224 03/23/20 0232  NA 136 139 139 139 136  K 4.3 4.4 4.1 4.0 3.9  CL 104 109 108 108 105  CO2 23 18* 18* 21* 20*  GLUCOSE 163* 140* 131* 106* 99  BUN 8 21 26* 29*  24*  CREATININE 0.96 2.00* 1.63* 1.40* 0.93  CALCIUM 7.9* 7.8* 7.6* 7.8* 7.7*  MG  --   --   --  1.9  --    GFR Estimated Creatinine Clearance: 66.7 mL/min (by C-G formula based on SCr of 0.93 mg/dL). Liver Function Tests: Recent Labs  Lab 04/06/20 0343 03/20/20 0406 03/21/20 0455 03/22/20 0224 03/23/20 0232  AST 16 274* 104* 57* 78*  ALT 29 249* 185* 105* 90*  ALKPHOS 92 100 89 68 73  BILITOT 1.4* 1.8* 1.9* 1.6* 2.1*  PROT 5.1* 5.9* 5.5* 4.8* 5.0*  ALBUMIN 2.1* 2.4* 2.2* 1.8* 1.7*   No results for input(s): LIPASE, AMYLASE in the last 168 hours. No results for input(s): AMMONIA in the last 168 hours. Coagulation  profile No results for input(s): INR, PROTIME in the last 168 hours.  CBC: Recent Labs  Lab 03/20/20 0406 03/21/20 0455 03/22/20 0224 03/23/20 0232 03/30/2020 0528  WBC 19.1* 20.0* 11.3* 12.0* 15.2*  HGB 13.1 12.8* 11.1* 11.8* 11.4*  HCT 37.5* 39.6 32.2* 36.2* 34.3*  MCV 86.2 89.0 86.8 87.7 86.0  PLT 259 254 235 326 347   Cardiac Enzymes: No results for input(s): CKTOTAL, CKMB, CKMBINDEX, TROPONINI in the last 168 hours. BNP (last 3 results) No results for input(s): PROBNP in the last 8760 hours. CBG: No results for input(s): GLUCAP in the last 168 hours. D-Dimer: No results for input(s): DDIMER in the last 72 hours. Hgb A1c: No results for input(s): HGBA1C in the last 72 hours. Lipid Profile: No results for input(s): CHOL, HDL, LDLCALC, TRIG, CHOLHDL, LDLDIRECT in the last 72 hours. Thyroid function studies: Recent Labs    03/23/20 0232  TSH 2.804   Anemia work up: No results for input(s): VITAMINB12, FOLATE, FERRITIN, TIBC, IRON, RETICCTPCT in the last 72 hours. Sepsis Labs: Recent Labs  Lab 03/21/20 0455 03/22/20 0224 03/23/20 0232 03/16/2020 0528  WBC 20.0* 11.3* 12.0* 15.2*    Microbiology Recent Results (from the past 240 hour(s))  Aerobic/Anaerobic Culture (surgical/deep wound)     Status: Abnormal (Preliminary result)   Collection Time: 03/21/20 10:37 AM   Specimen: Abscess  Result Value Ref Range Status   Specimen Description ABSCESS  Final   Special Requests PERIHEPATIC BILOMA  Final   Gram Stain   Final    FEW WBC PRESENT, PREDOMINANTLY PMN ABUNDANT GRAM NEGATIVE RODS MODERATE GRAM POSITIVE COCCI IN PAIRS IN CHAINS Performed at [facility], [address] [place], [place] [address]    Culture (A)  Final    MULTIPLE ORGANISMS PRESENT, NONE PREDOMINANT NO ANAEROBES ISOLATED; CULTURE IN PROGRESS FOR 5 DAYS    Report Status PENDING  Incomplete  MRSA PCR Screening     Status: None   Collection Time: 03/22/20  8:57 PM   Specimen:  Nasopharyngeal  Result Value Ref Range Status   MRSA by PCR NEGATIVE NEGATIVE Final    Comment:        The GeneXpert MRSA Assay (FDA approved for NASAL specimens only), is one component of a comprehensive MRSA colonization surveillance program. It is not intended to diagnose MRSA infection nor to guide or monitor treatment for MRSA infections. Performed at [facility], [address], [place], [place] [address]     Procedures and diagnostic studies:  DG Abd 1 View  Result Date: 03/23/2020 CLINICAL DATA:  Bile duct obstruction.  Biliary stent position. EXAM: ABDOMEN - 1 VIEW COMPARISON:  03/17/2020 FINDINGS: Again noted is a biliary stent. This biliary stent  is stable positioning with compared to prior study. The bowel gas pattern is nonspecific with some gaseous distention of loops of small bowel scattered throughout the abdomen. There is no evidence for high-grade bowel obstruction. There is a percutaneous drain projecting over the right upper quadrant. There is likely some pneumobilia which is stable from prior CT. There are chronic changes of the patient's proximal left femur. IMPRESSION: 1. Stable positioning of the biliary stent. 2. Nonspecific bowel gas pattern with gaseous distention of loops of small bowel scattered throughout the abdomen. Electronically Signed   By: Katherine Mantle M.D.   On: 03/23/2020 23:54   DG CHEST PORT 1 VIEW  Result Date: 03/23/2020 CLINICAL DATA:  Short of breath EXAM: PORTABLE CHEST 1 VIEW COMPARISON:  08/23/2019 FINDINGS: Single frontal view of the chest demonstrates an unremarkable cardiac silhouette. Her bibasilar areas of consolidation likely reflecting atelectasis. The right pleural effusion seen on recent CT is not well visualized. No pneumothorax. No acute bony abnormalities. IMPRESSION: 1. Bibasilar consolidation consistent with atelectasis. The right pleural effusion seen on recent CT is not well visualized. Electronically Signed   By:  Sharlet Salina M.D.   On: 03/23/2020 15:35   ECHOCARDIOGRAM COMPLETE  Result Date: 03/23/2020    ECHOCARDIOGRAM REPORT   Patient Name:   Jorge Evans. Date of Exam: 03/23/2020 Medical Rec #:  [id number]           Height:       66.0 in Accession #:    [id number]          Weight:       154.0 lb Date of Birth:  [birth year]           BSA:          1.790 m Patient Age:    69 years            BP:           121/65 mmHg Patient Gender: M                   HR:           88 bpm. Exam Location:  Inpatient Procedure: 2D Echo and Intracardiac Opacification Agent Indications:    atrial fibrillation  History:        Patient has no prior history of Echocardiogram examinations.                 Arrythmias:Atrial Fibrillation.  Sonographer:    Delcie Roch Referring Phys: [id number] Jayci Ellefson IMPRESSIONS  1. Left ventricular ejection fraction, by estimation, is 65 to 70%. The left ventricle has normal function. The left ventricle has no regional wall motion abnormalities. Left ventricular diastolic function could not be evaluated.  2. Right ventricular systolic function is normal. The right ventricular size is normal. There is normal pulmonary artery systolic pressure. The estimated right ventricular systolic pressure is 23.4 mmHg.  3. The mitral valve is grossly normal. Trivial mitral valve regurgitation.  4. The aortic valve is calcified. Aortic valve regurgitation is trivial. No aortic stenosis is present.  5. The inferior vena cava is normal in size with greater than 50% respiratory variability, suggesting right atrial pressure of 3 mmHg. Comparison(s): No prior Echocardiogram. FINDINGS  Left Ventricle: Left ventricular ejection fraction, by estimation, is 65 to 70%. The left ventricle has normal function. The left ventricle has no regional wall motion abnormalities. Definity contrast agent was given IV to delineate the left ventricular  endocardial borders.  The left ventricular internal cavity size was normal in  size. There is no left ventricular hypertrophy. Left ventricular diastolic function could not be evaluated due to atrial fibrillation. Left ventricular diastolic function could  not be evaluated. Right Ventricle: The right ventricular size is normal. No increase in right ventricular wall thickness. Right ventricular systolic function is normal. There is normal pulmonary artery systolic pressure. The tricuspid regurgitant velocity is 2.26 m/s, and  with an assumed right atrial pressure of 3 mmHg, the estimated right ventricular systolic pressure is 23.4 mmHg. Left Atrium: Left atrial size was normal in size. Right Atrium: Right atrial size was normal in size. Pericardium: There is no evidence of pericardial effusion. Mitral Valve: The mitral valve is grossly normal. Trivial mitral valve regurgitation. Tricuspid Valve: The tricuspid valve is grossly normal. Tricuspid valve regurgitation is trivial. Aortic Valve: The aortic valve is calcified. Aortic valve regurgitation is trivial. No aortic stenosis is present. Pulmonic Valve: The pulmonic valve was normal in structure. Pulmonic valve regurgitation is not visualized. Aorta: The aortic root and ascending aorta are structurally normal, with no evidence of dilitation. Venous: The inferior vena cava is normal in size with greater than 50% respiratory variability, suggesting right atrial pressure of 3 mmHg. IAS/Shunts: No atrial level shunt detected by color flow Doppler.  LEFT VENTRICLE PLAX 2D LVIDd:         4.20 cm LVIDs:         2.50 cm LV PW:         1.00 cm LV IVS:        1.10 cm LVOT diam:     2.00 cm LVOT Area:     3.14 cm  LEFT ATRIUM             Index       RIGHT ATRIUM           Index LA diam:        3.90 cm 2.18 cm/m  RA Area:     11.30 cm LA Vol (A2C):   29.1 ml 16.26 ml/m RA Volume:   20.20 ml  11.29 ml/m LA Vol (A4C):   40.4 ml 22.58 ml/m LA Biplane Vol: 37.7 ml 21.07 ml/m   AORTA Ao Root diam: 3.40 cm Ao Asc diam:  3.60 cm TRICUSPID VALVE TR Peak  grad:   20.4 mmHg TR Vmax:        226.00 cm/s  SHUNTS Systemic Diam: 2.00 cm Zoila Shutter MD Electronically signed by Zoila Shutter MD Signature Date/Time: 03/23/2020/5:05:35 PM    Final     Medications:   . [MAR Hold] acetaminophen  1,000 mg Oral Q8H  . [MAR Hold] lidocaine  1 patch Transdermal Q24H  . [MAR Hold] lidocaine  1 application Urethral Once  . [MAR Hold] metoprolol tartrate  25 mg Oral BID  . [MAR Hold] pantoprazole (PROTONIX) IV  40 mg Intravenous Q12H  . [MAR Hold] senna-docusate  2 tablet Oral BID  . [MAR Hold] sucralfate  1 g Oral TID WC & HS   Continuous Infusions: . sodium chloride    . [MAR Hold] diltiazem (CARDIZEM) infusion 15 mg/hr (03/04/2020 1038)  . [MAR Hold] famotidine (PEPCID) IV    . lactated ringers 50 mL/hr at 03/05/2020 1035  . [MAR Hold] piperacillin-tazobactam (ZOSYN)  IV 12.5 mL/hr at 04/03/2020 0510     LOS: 11 days   Joseph Art  [facility]   How to contact the [facility] Attending or Consulting provider 7A - 7P  or covering provider during after hours 7P -7A, for this patient?  1. Check the care team in [facility] and look for a) attending/consulting [facility] provider listed and b) the [facility] team listed 2. Log into [email or web address] and use [place]'s universal password to access. If you do not have the password, please contact the hospital operator. 3. Locate the [facility] provider you are looking for under [facility] and page to a number that you can be directly reached. 4. If you still have difficulty reaching the provider, please page the [facility] (Director on Call) for the Hospitalists listed on amion for assistance.  03/18/2020, 11:34 AM

## 2020-03-24 NOTE — Anesthesia Procedure Notes (Signed)
Procedure Name: Intubation Date/Time: 03/27/2020 10:42 AM Performed by: Adria Dill, CRNA Pre-anesthesia Checklist: Patient identified, Emergency Drugs available, Suction available and Patient being monitored Patient Re-evaluated:Patient Re-evaluated prior to induction Oxygen Delivery Method: Circle system utilized Preoxygenation: Pre-oxygenation with 100% oxygen Induction Type: IV induction, Rapid sequence and Cricoid Pressure applied Laryngoscope Size: Miller and 2 Grade View: Grade I Tube type: Oral Tube size: 7.5 mm Number of attempts: 1 Airway Equipment and Method: Stylet and Oral airway Placement Confirmation: ETT inserted through vocal cords under direct vision,  positive ETCO2 and breath sounds checked- equal and bilateral Secured at: 22 cm Tube secured with: Tape Dental Injury: Teeth and Oropharynx as per pre-operative assessment

## 2020-03-24 NOTE — Interval H&P Note (Signed)
History and Physical Interval Note:  03/18/2020 10:26 AM  Jorge Evans.  has presented today for surgery, with the diagnosis of Choledocholithiasis and bile leak..  The various methods of treatment have been discussed with the patient and family. After consideration of risks, benefits and other options for treatment, the patient has consented to  Procedure(s): ENDOSCOPIC RETROGRADE CHOLANGIOPANCREATOGRAPHY (ERCP) (N/A) as a surgical intervention.  The patient's history has been reviewed, patient examined, no change in status, stable for surgery.  I have reviewed the patient's chart and labs.  Questions were answered to the patient's satisfaction.    I spoke in great detail today with the patient's wife.  We went over the findings from initial hospitalization to clinical issues that have occurred along the way.  This is a higher than normal risk ERCP due to patient's anatomy.  Suspect that I will need to kneedle-knife into the ampulla and do a balloon dilation for attempt at stone extraction.  Will need stent replacement in setting of biliary leak that is occurring currently.  The risks of an ERCP were discussed at length, including but not limited to the risk of perforation, bleeding, abdominal pain, post-ERCP pancreatitis (while usually mild can be severe and even life threatening).   [employer]

## 2020-03-24 NOTE — Progress Notes (Signed)
ANTICOAGULATION CONSULT NOTE  Pharmacy Consult for heparin Indication: atrial fibrillation  Allergies  Allergen Reactions  . Other Itching  . Demerol [Meperidine] Rash    Patient Measurements: Height: 5\' 6"  (167.6 cm) Weight: 69.9 kg (154 lb) IBW/kg (Calculated) : 63.8 Heparin Dosing Weight: 69kg  Vital Signs: Temp: 100.2 F (37.9 C) (12/21 1346) Temp Source: Axillary (12/21 1346) BP: 113/57 (12/21 1410) Pulse Rate: 90 (12/21 1410)  Labs: Recent Labs    03/21/20 1535 03/22/20 0224 03/22/20 0224 03/23/20 0232 03/23/20 0950 03/23/20 1955 03/31/2020 0528  HGB  --  11.1*   < > 11.8*  --   --  11.4*  HCT  --  32.2*  --  36.2*  --   --  34.3*  PLT  --  235  --  326  --   --  347  HEPARINUNFRC  --   --    < > <0.10* <0.10* <0.10* <0.10*  CREATININE 1.63* 1.40*  --  0.93  --   --   --    < > = values in this interval not displayed.    Estimated Creatinine Clearance: 66.7 mL/min (by C-G formula based on SCr of 0.93 mg/dL).   Medical History: Past Medical History:  Diagnosis Date  . Anemia   . GERD (gastroesophageal reflux disease)      Assessment: 28 yoM admitted with abdominal pain s/p ERCP 12/10, cholecystectomy 12/16, and drain placement 12/18. Pt developed AFib, pharmacy to start heparin infusion, no AC PTA.  Heparin held this morning for ERCP, ok to resume with no bolus at 2000 tonight per GI.  Goal of Therapy:  Heparin level 0.3-0.7 units/ml Monitor platelets by anticoagulation protocol: Yes   Plan:  -Restart heparin 1600 units/h no bolus at 2000 -Check 8h heparin level   1/19, PharmD, BCPS, [facility] Clinical Pharmacist [phone] Please check AMION for all [facility] numbers 03/17/2020

## 2020-03-24 NOTE — Transfer of Care (Signed)
Immediate Anesthesia Transfer of Care Note  Patient: Jorge Evans.  Procedure(s) Performed: ENDOSCOPIC RETROGRADE CHOLANGIOPANCREATOGRAPHY (ERCP) (N/A ) SPHINCTEROTOMY BILIARY DILATION REMOVAL OF STONES STENT REMOVAL SUBMUCOSAL TATTOO INJECTION BIOPSY LITHOTRIPSY BILIARY STENT PLACEMENT PANCREATIC STENT PLACEMENT  Patient Location: PACU and Endoscopy Unit  Anesthesia Type:General  Level of Consciousness: drowsy and patient cooperative  Airway & Oxygen Therapy: Patient Spontanous Breathing and Patient connected to face mask oxygen  Post-op Assessment: Report given to RN and Post -op Vital signs reviewed and stable  Post vital signs: Reviewed and stable  Last Vitals:  Vitals Value Taken Time  BP    Temp    Pulse    Resp    SpO2      Last Pain:  Vitals:   03/08/2020 1007  TempSrc: Temporal  PainSc: 0-No pain      Patients Stated Pain Goal: 0 (03/23/20 2126)  Complications: No complications documented.

## 2020-03-24 NOTE — Progress Notes (Signed)
Progress Note  5 Days Post-Op  Subjective: Patient's wife at bedside. Patient denies abdominal pain this AM but reports he just doesn't feel great. Denies nausea, 1 episode of emesis recorded for overnight.   Objective: Vital signs in last 24 hours: Temp:  [98 F (36.7 C)-98.8 F (37.1 C)] 98.4 F (36.9 C) (12/21 0738) Pulse Rate:  [78-97] 97 (12/21 0738) Resp:  [16-25] 25 (12/21 0738) BP: (111-130)/(58-91) 115/74 (12/21 0738) SpO2:  [90 %-95 %] 90 % (12/21 0738) Last BM Date: 03/18/20  Intake/Output from previous day: 12/20 0701 - 12/21 0700 In: 2382.3 [I.V.:2231.1; IV Piggyback:151.2] Out: 860 [Urine:250; Drains:610] Intake/Output this shift: Total I/O In: 177 [I.V.:141.7; IV Piggyback:35.3] Out: 45 [Drains:45]  PE: General: lethargic, WD, overweight male Heart: irregularly irregular in the 100s Lungs: CTAB, no wheezes, rhonchi, or rales noted.  Respiratory effort nonlabored Abd: soft, NT, moderately distended, incisions c/d/i, drain in RUQ with bilious fluid   Lab Results:  Recent Labs    03/23/20 0232 03/18/2020 0528  WBC 12.0* 15.2*  HGB 11.8* 11.4*  HCT 36.2* 34.3*  PLT 326 347   BMET Recent Labs    03/22/20 0224 03/23/20 0232  NA 139 136  K 4.0 3.9  CL 108 105  CO2 21* 20*  GLUCOSE 106* 99  BUN 29* 24*  CREATININE 1.40* 0.93  CALCIUM 7.8* 7.7*   PT/INR No results for input(s): LABPROT, INR in the last 72 hours. CMP     Component Value Date/Time   NA 136 03/23/2020 0232   K 3.9 03/23/2020 0232   CL 105 03/23/2020 0232   CO2 20 (L) 03/23/2020 0232   GLUCOSE 99 03/23/2020 0232   BUN 24 (H) 03/23/2020 0232   CREATININE 0.93 03/23/2020 0232   CREATININE 0.93 11/19/2019 0842   CALCIUM 7.7 (L) 03/23/2020 0232   PROT 5.0 (L) 03/23/2020 0232   ALBUMIN 1.7 (L) 03/23/2020 0232   AST 78 (H) 03/23/2020 0232   ALT 90 (H) 03/23/2020 0232   ALKPHOS 73 03/23/2020 0232   BILITOT 2.1 (H) 03/23/2020 0232   GFRNONAA >60 03/23/2020 0232   GFRAA >60  11/11/2016 1600   Lipase     Component Value Date/Time   LIPASE 20 04/01/2020 1452       Studies/Results: DG Abd 1 View  Result Date: 03/23/2020 CLINICAL DATA:  Bile duct obstruction.  Biliary stent position. EXAM: ABDOMEN - 1 VIEW COMPARISON:  03/17/2020 FINDINGS: Again noted is a biliary stent. This biliary stent is stable positioning with compared to prior study. The bowel gas pattern is nonspecific with some gaseous distention of loops of small bowel scattered throughout the abdomen. There is no evidence for high-grade bowel obstruction. There is a percutaneous drain projecting over the right upper quadrant. There is likely some pneumobilia which is stable from prior CT. There are chronic changes of the patient's proximal left femur. IMPRESSION: 1. Stable positioning of the biliary stent. 2. Nonspecific bowel gas pattern with gaseous distention of loops of small bowel scattered throughout the abdomen. Electronically Signed   By: Katherine Mantle M.D.   On: 03/23/2020 23:54   DG CHEST PORT 1 VIEW  Result Date: 03/23/2020 CLINICAL DATA:  Short of breath EXAM: PORTABLE CHEST 1 VIEW COMPARISON:  08/23/2019 FINDINGS: Single frontal view of the chest demonstrates an unremarkable cardiac silhouette. Her bibasilar areas of consolidation likely reflecting atelectasis. The right pleural effusion seen on recent CT is not well visualized. No pneumothorax. No acute bony abnormalities. IMPRESSION: 1. Bibasilar consolidation consistent  with atelectasis. The right pleural effusion seen on recent CT is not well visualized. Electronically Signed   By: Sharlet Salina M.D.   On: 03/23/2020 15:35   ECHOCARDIOGRAM COMPLETE  Result Date: 03/23/2020    ECHOCARDIOGRAM REPORT   Patient Name:   Jorge Evans. Date of Exam: 03/23/2020 Medical Rec #:  [id number]           Height:       66.0 in Accession #:    [id number]          Weight:       154.0 lb Date of Birth:  08-05-1950           BSA:          1.790 m  Patient Age:    69 years            BP:           121/65 mmHg Patient Gender: M                   HR:           88 bpm. Exam Location:  Inpatient Procedure: 2D Echo and Intracardiac Opacification Agent Indications:    atrial fibrillation  History:        Patient has no prior history of Echocardiogram examinations.                 Arrythmias:Atrial Fibrillation.  Sonographer:    Delcie Roch Referring Phys: [id number] JESSICA U VANN IMPRESSIONS  1. Left ventricular ejection fraction, by estimation, is 65 to 70%. The left ventricle has normal function. The left ventricle has no regional wall motion abnormalities. Left ventricular diastolic function could not be evaluated.  2. Right ventricular systolic function is normal. The right ventricular size is normal. There is normal pulmonary artery systolic pressure. The estimated right ventricular systolic pressure is 23.4 mmHg.  3. The mitral valve is grossly normal. Trivial mitral valve regurgitation.  4. The aortic valve is calcified. Aortic valve regurgitation is trivial. No aortic stenosis is present.  5. The inferior vena cava is normal in size with greater than 50% respiratory variability, suggesting right atrial pressure of 3 mmHg. Comparison(s): No prior Echocardiogram. FINDINGS  Left Ventricle: Left ventricular ejection fraction, by estimation, is 65 to 70%. The left ventricle has normal function. The left ventricle has no regional wall motion abnormalities. Definity contrast agent was given IV to delineate the left ventricular  endocardial borders. The left ventricular internal cavity size was normal in size. There is no left ventricular hypertrophy. Left ventricular diastolic function could not be evaluated due to atrial fibrillation. Left ventricular diastolic function could  not be evaluated. Right Ventricle: The right ventricular size is normal. No increase in right ventricular wall thickness. Right ventricular systolic function is normal. There is normal  pulmonary artery systolic pressure. The tricuspid regurgitant velocity is 2.26 m/s, and  with an assumed right atrial pressure of 3 mmHg, the estimated right ventricular systolic pressure is 23.4 mmHg. Left Atrium: Left atrial size was normal in size. Right Atrium: Right atrial size was normal in size. Pericardium: There is no evidence of pericardial effusion. Mitral Valve: The mitral valve is grossly normal. Trivial mitral valve regurgitation. Tricuspid Valve: The tricuspid valve is grossly normal. Tricuspid valve regurgitation is trivial. Aortic Valve: The aortic valve is calcified. Aortic valve regurgitation is trivial. No aortic stenosis is present. Pulmonic Valve: The pulmonic valve was normal in structure. Pulmonic valve regurgitation is  not visualized. Aorta: The aortic root and ascending aorta are structurally normal, with no evidence of dilitation. Venous: The inferior vena cava is normal in size with greater than 50% respiratory variability, suggesting right atrial pressure of 3 mmHg. IAS/Shunts: No atrial level shunt detected by color flow Doppler.  LEFT VENTRICLE PLAX 2D LVIDd:         4.20 cm LVIDs:         2.50 cm LV PW:         1.00 cm LV IVS:        1.10 cm LVOT diam:     2.00 cm LVOT Area:     3.14 cm  LEFT ATRIUM             Index       RIGHT ATRIUM           Index LA diam:        3.90 cm 2.18 cm/m  RA Area:     11.30 cm LA Vol (A2C):   29.1 ml 16.26 ml/m RA Volume:   20.20 ml  11.29 ml/m LA Vol (A4C):   40.4 ml 22.58 ml/m LA Biplane Vol: 37.7 ml 21.07 ml/m   AORTA Ao Root diam: 3.40 cm Ao Asc diam:  3.60 cm TRICUSPID VALVE TR Peak grad:   20.4 mmHg TR Vmax:        226.00 cm/s  SHUNTS Systemic Diam: 2.00 cm Zoila Shutter MD Electronically signed by Zoila Shutter MD Signature Date/Time: 03/23/2020/5:05:35 PM    Final     Anti-infectives: Anti-infectives (From admission, onward)   Start     Dose/Rate Route Frequency Ordered Stop   03/21/20 0200  piperacillin-tazobactam (ZOSYN) IVPB 3.375  g        3.375 g 12.5 mL/hr over 240 Minutes Intravenous Every 8 hours 03/21/20 0007     03/14/2020 0600  ceFAZolin (ANCEF) IVPB 2g/100 mL premix        2 g 200 mL/hr over 30 Minutes Intravenous On call to O.R. 03/18/20 1334 03/20/20 0559   03/10/2020 0600  piperacillin-tazobactam (ZOSYN) IVPB 3.375 g  Status:  Discontinued        3.375 g 12.5 mL/hr over 240 Minutes Intravenous Every 8 hours 03/31/2020 0046 03/15/20 1109   03/28/20 2330  piperacillin-tazobactam (ZOSYN) IVPB 3.375 g        3.375 g 100 mL/hr over 30 Minutes Intravenous  Once March 28, 2020 2317 04/02/2020 0131       Assessment/Plan GERD IDA PMH Bilroth II for bleeding gastric ulcers ABL anemia - Hgb11.4, stable A. Fib with RVR - on heparin and diltiazem gtts  Symptomatic cholelithiasis Choledocholithiasis without evidence of acute cholecystitis  S/p ERCP, placement of CBD stent 12/10 [name] -unable to perform a sphincterotomy and stone extraction due to anatomy s/p Billroth II S/plaparoscopic cholecystectomy12/16 Dr. Janee Morn Postoperative bile leak - POD#5 - CT scan 12/17 revealed moderate volumeperihepatic free fluid containing foci of air with non organized fluid and air in the gallbladder fossaconcerning for biloma - s/p IR drain placement 12/18 with yield high volume bilious fluid, culture pending - plan for repeat ERCP today with Dr. Meridee Score   ID: [id number] 12/18>> FEN:IVF, NPO VTE: SCDs, heparin gtt on hold for procedure this AM Foley:none  LOS: 11 days    Juliet Rude , [facility] 03/31/2020, 8:39 AM Please see Amion for pager number during day hours 7:00am-4:30pm

## 2020-03-24 NOTE — Progress Notes (Signed)
Progress Note  Patient Name: Jorge Evans. Date of Encounter: Mar 30, 2020  [facility] Cardiologist: Parke Poisson, MD   Subjective   No acute overnight events. Repeat ERCP today showed LA grade D esophagitis with bleeding throughout as well as gastritis. Choledocholithiasis and biliary sludge was found. Partial removal was accomplished with biliary precut sphincterotomy and subsequent balloon sphincteroplasty and subsequent balloown trawl. Further stone material required mechnical lithotripsy which was partially successful. Eventually a stent was inserted due to known biliary leak into the bile duct. One plastic pancreatic stent was also placed into the ventral pancreatic duct.  Appears more comfortable this evening as compared to prior. No CP, SOB, or palpitations  Inpatient Medications    Scheduled Meds: . acetaminophen  1,000 mg Oral Q8H  . lidocaine  1 patch Transdermal Q24H  . lidocaine  1 application Urethral Once  . metoprolol tartrate  25 mg Oral BID  . pantoprazole (PROTONIX) IV  40 mg Intravenous Q12H  . senna-docusate  2 tablet Oral BID  . sucralfate  1 g Oral TID WC & HS   Continuous Infusions: . diltiazem (CARDIZEM) infusion 15 mg/hr (03/30/20 1600)  . famotidine (PEPCID) IV    . heparin 1,600 Units/hr (Mar 30, 2020 2000)  . lactated ringers 50 mL/hr at 03/30/20 1655  . piperacillin-tazobactam (ZOSYN)  IV 3.375 g (2020-03-30 1449)   PRN Meds: acetaminophen **OR** acetaminophen, dicyclomine, metoCLOPramide (REGLAN) injection   Vital Signs    Vitals:   03/30/2020 1410 2020-03-30 1436 03/30/20 1600 03-30-2020 1950  BP: (!) 113/57 (!) 118/56 111/72 112/60  Pulse: 90 86 81 90  Resp: (!) 36 (!) 22 16 20   Temp:  98.5 F (36.9 C) 98.4 F (36.9 C) 97.7 F (36.5 C)  TempSrc:  Axillary Axillary Oral  SpO2: 94% 93% 93% 94%  Weight:      Height:        Intake/Output Summary (Last 24 hours) at 03-30-20 2012 Last data filed at 03/30/20 1950 Gross per 24  hour  Intake 1871.37 ml  Output 660 ml  Net 1211.37 ml   Last 3 Weights 03/16/2020 11/19/2019 08/23/2019  Weight (lbs) 154 lb 155 lb 161 lb  Weight (kg) 69.854 kg 70.308 kg 73.029 kg      Telemetry    Rate controlled atrial fibrillation - Personally Reviewed  ECG    No new ECG tracing today. - Personally Reviewed  Physical Exam   GEN: No acute distress.   Neck: No JVD Cardiac: iRRR, no murmurs, rubs, or gallops.  Respiratory: Clear to auscultation bilaterally. GI: Soft, nontender, non-distended  MS: No edema; No deformity. Neuro:  Nonfocal  Psych: still appears someone confused, unable to clearly answer questions appropriately  Labs    High Sensitivity Troponin:  No results for input(s): TROPONINIHS in the last 720 hours.    Chemistry Recent Labs  Lab 03/21/20 0455 03/21/20 1535 03/22/20 0224 03/23/20 0232  NA 139 139 139 136  K 4.4 4.1 4.0 3.9  CL 109 108 108 105  CO2 18* 18* 21* 20*  GLUCOSE 140* 131* 106* 99  BUN 21 26* 29* 24*  CREATININE 2.00* 1.63* 1.40* 0.93  CALCIUM 7.8* 7.6* 7.8* 7.7*  PROT 5.5*  --  4.8* 5.0*  ALBUMIN 2.2*  --  1.8* 1.7*  AST 104*  --  57* 78*  ALT 185*  --  105* 90*  ALKPHOS 89  --  68 73  BILITOT 1.9*  --  1.6* 2.1*  GFRNONAA 35* 45* 54* >  60  ANIONGAP 12 13 10 11      Hematology Recent Labs  Lab 03/22/20 0224 03/23/20 0232 03/18/2020 0528  WBC 11.3* 12.0* 15.2*  RBC 3.71* 4.13* 3.99*  HGB 11.1* 11.8* 11.4*  HCT 32.2* 36.2* 34.3*  MCV 86.8 87.7 86.0  MCH 29.9 28.6 28.6  MCHC 34.5 32.6 33.2  RDW 13.2 13.3 13.3  PLT 235 326 347    BNP Recent Labs  Lab 03/23/20 0232  BNP 1,235.1*     DDimer No results for input(s): DDIMER in the last 168 hours.   Radiology    DG Abd 1 View  Result Date: 03/23/2020 CLINICAL DATA:  Bile duct obstruction.  Biliary stent position. EXAM: ABDOMEN - 1 VIEW COMPARISON:  03/17/2020 FINDINGS: Again noted is a biliary stent. This biliary stent is stable positioning with compared to prior  study. The bowel gas pattern is nonspecific with some gaseous distention of loops of small bowel scattered throughout the abdomen. There is no evidence for high-grade bowel obstruction. There is a percutaneous drain projecting over the right upper quadrant. There is likely some pneumobilia which is stable from prior CT. There are chronic changes of the patient's proximal left femur. IMPRESSION: 1. Stable positioning of the biliary stent. 2. Nonspecific bowel gas pattern with gaseous distention of loops of small bowel scattered throughout the abdomen. Electronically Signed   By: 03/21/2020 M.D.   On: 03/23/2020 23:54   DG CHEST PORT 1 VIEW  Result Date: 03/23/2020 CLINICAL DATA:  Short of breath EXAM: PORTABLE CHEST 1 VIEW COMPARISON:  08/23/2019 FINDINGS: Single frontal view of the chest demonstrates an unremarkable cardiac silhouette. Her bibasilar areas of consolidation likely reflecting atelectasis. The right pleural effusion seen on recent CT is not well visualized. No pneumothorax. No acute bony abnormalities. IMPRESSION: 1. Bibasilar consolidation consistent with atelectasis. The right pleural effusion seen on recent CT is not well visualized. Electronically Signed   By: 08/25/2019 M.D.   On: 03/23/2020 15:35   DG ERCP BILIARY & PANCREATIC DUCTS  Result Date: 03/30/2020 CLINICAL DATA:  Common bile duct obstruction EXAM: ERCP TECHNIQUE: Multiple spot images obtained with the fluoroscopic device and submitted for interpretation post-procedure. FLUOROSCOPY TIME:  Fluoroscopy Time:  13 minutes and 28 seconds Radiation Exposure Index (if provided by the fluoroscopic device): 189.2 mGy Number of Acquired Spot Images: 20 COMPARISON:  CT dated 03/20/2020 FINDINGS: Initial images demonstrate a plastic biliary stent in place. Subsequent images demonstrate removal of the plastic biliary stent and cannulation of the common bile duct with injection of contrast. There are multiple filling defects  within the common bile duct. This appears to have been followed by a balloon sweep and possible sphincterotomy. Final images demonstrate placement of a pancreatic stent and common bile duct stent. There was no definite extraluminal contrast visualized during the study. IMPRESSION: ERCP as above. These images were submitted for radiologic interpretation only. Please see the procedural report for the amount of contrast and the fluoroscopy time utilized. Electronically Signed   By: 03/22/2020 M.D.   On: 03/11/2020 14:43   ECHOCARDIOGRAM COMPLETE  Result Date: 03/23/2020    ECHOCARDIOGRAM REPORT   Patient Name:   Jorge Evans. Date of Exam: 03/23/2020 Medical Rec #:  03/25/2020           Height:       66.0 in Accession #:    [id number]          Weight:       154.0  lb Date of Birth:  08-27-1950           BSA:          1.790 m Patient Age:    69 years            BP:           121/65 mmHg Patient Gender: M                   HR:           88 bpm. Exam Location:  Inpatient Procedure: 2D Echo and Intracardiac Opacification Agent Indications:    atrial fibrillation  History:        Patient has no prior history of Echocardiogram examinations.                 Arrythmias:Atrial Fibrillation.  Sonographer:    Delcie Roch Referring Phys: [id number] JESSICA U VANN IMPRESSIONS  1. Left ventricular ejection fraction, by estimation, is 65 to 70%. The left ventricle has normal function. The left ventricle has no regional wall motion abnormalities. Left ventricular diastolic function could not be evaluated.  2. Right ventricular systolic function is normal. The right ventricular size is normal. There is normal pulmonary artery systolic pressure. The estimated right ventricular systolic pressure is 23.4 mmHg.  3. The mitral valve is grossly normal. Trivial mitral valve regurgitation.  4. The aortic valve is calcified. Aortic valve regurgitation is trivial. No aortic stenosis is present.  5. The inferior vena cava is  normal in size with greater than 50% respiratory variability, suggesting right atrial pressure of 3 mmHg. Comparison(s): No prior Echocardiogram. FINDINGS  Left Ventricle: Left ventricular ejection fraction, by estimation, is 65 to 70%. The left ventricle has normal function. The left ventricle has no regional wall motion abnormalities. Definity contrast agent was given IV to delineate the left ventricular  endocardial borders. The left ventricular internal cavity size was normal in size. There is no left ventricular hypertrophy. Left ventricular diastolic function could not be evaluated due to atrial fibrillation. Left ventricular diastolic function could  not be evaluated. Right Ventricle: The right ventricular size is normal. No increase in right ventricular wall thickness. Right ventricular systolic function is normal. There is normal pulmonary artery systolic pressure. The tricuspid regurgitant velocity is 2.26 m/s, and  with an assumed right atrial pressure of 3 mmHg, the estimated right ventricular systolic pressure is 23.4 mmHg. Left Atrium: Left atrial size was normal in size. Right Atrium: Right atrial size was normal in size. Pericardium: There is no evidence of pericardial effusion. Mitral Valve: The mitral valve is grossly normal. Trivial mitral valve regurgitation. Tricuspid Valve: The tricuspid valve is grossly normal. Tricuspid valve regurgitation is trivial. Aortic Valve: The aortic valve is calcified. Aortic valve regurgitation is trivial. No aortic stenosis is present. Pulmonic Valve: The pulmonic valve was normal in structure. Pulmonic valve regurgitation is not visualized. Aorta: The aortic root and ascending aorta are structurally normal, with no evidence of dilitation. Venous: The inferior vena cava is normal in size with greater than 50% respiratory variability, suggesting right atrial pressure of 3 mmHg. IAS/Shunts: No atrial level shunt detected by color flow Doppler.  LEFT VENTRICLE PLAX  2D LVIDd:         4.20 cm LVIDs:         2.50 cm LV PW:         1.00 cm LV IVS:        1.10 cm LVOT diam:  2.00 cm LVOT Area:     3.14 cm  LEFT ATRIUM             Index       RIGHT ATRIUM           Index LA diam:        3.90 cm 2.18 cm/m  RA Area:     11.30 cm LA Vol (A2C):   29.1 ml 16.26 ml/m RA Volume:   20.20 ml  11.29 ml/m LA Vol (A4C):   40.4 ml 22.58 ml/m LA Biplane Vol: 37.7 ml 21.07 ml/m   AORTA Ao Root diam: 3.40 cm Ao Asc diam:  3.60 cm TRICUSPID VALVE TR Peak grad:   20.4 mmHg TR Vmax:        226.00 cm/s  SHUNTS Systemic Diam: 2.00 cm Zoila Shutter MD Electronically signed by Zoila Shutter MD Signature Date/Time: 03/23/2020/5:05:35 PM    Final     Cardiac Studies   Echocardiogram 03/23/2020: 1. Left ventricular ejection fraction, by estimation, is 65 to 70%. The  left ventricle has normal function. The left ventricle has no regional  wall motion abnormalities. Left ventricular diastolic function could not  be evaluated.  2. Right ventricular systolic function is normal. The right ventricular  size is normal. There is normal pulmonary artery systolic pressure. The  estimated right ventricular systolic pressure is 23.4 mmHg.  3. The mitral valve is grossly normal. Trivial mitral valve  regurgitation.  4. The aortic valve is calcified. Aortic valve regurgitation is trivial.  No aortic stenosis is present.  5. The inferior vena cava is normal in size with greater than 50%  respiratory variability, suggesting right atrial pressure of 3 mmHg.   Comparison(s): No prior Echocardiogram.    Patient Profile     69 y.o. male with a history of GERD, gastric ulcers (wife reports partial gastrectomy), and anemia but no known cardiac history who is being seen today for the evaluation of atrial fibrillation with RVR at the request of Dr. Benjamine Mola.  Assessment & Plan    New Onset Atrial Fibrillation with RVR - In the setting of choledocholithiasis and recent cholecystectomy on  04/13/2020. - Rates reasonably well controlled in the 80's to low 100's.  - Potassium 3.9 today.  - Magnesium 1.9 yesterday. - TSH normal.  - Echo showed LVEF of 65-70% with normal wall motion and no significant valvular disease.  - Continue IV Cardizem and Lopressor 25mg  twice daily. Continue IV Cardizem through ERCP tomorrow. Can continue to up Lopressor as needed. - Suspect rates to improve as GI issues are addressed. - CHA2DS2-VASc = 2 (age and evidence of atherosclerosis of aorta from CT in 2018). Continue IV Heparin. Please see GI recommendations below. If he tolerates heparin, can switch to DOAC prior to discharge.   Dyspnea - Patient was having intermittent episodes of shortness of breath yesterday. - Chest x-ray showed bibasilar consolidation consistent with atelectasis.  - BNP elevated at 1,235.  - Echo showed LVEF of 65-70% with normal wall motion and no significant valvular disease. Diastolic function could not be evaluated.  - Does not appear volume overloaded on exam. He is net positive 6.5 L due to IV fluids. Will give one dose of IV Lasix 40mg  for now and reassess in the morning. - Wife does report snoring and that patient doesn't sleep well at night. Wonder if patient could have sleep apnea given body habitus. Could consider outpatient sleep study.   Choledocholithiasis  - S/p ERCP on  2019-10-14, laparoscopic cholecystectomy on 03/27/2020, drain placement on 03/21/2020.  - Repeat ERCP on 03/09/2020 showed Repeat ERCP today showed LA grade D esophagitis with bleeding throughout as well as gastritis. Choledocholithiasis and biliary sludge was found. Partial removal was accomplished with biliary precut sphincterotomy and subsequent balloon sphincteroplasty and subsequent balloown trawl. Further stone material required mechnical lithotripsy which was partially successful. Eventually a stent was inserted due to known biliary leak into the bile duct. One plastic pancreatic stent was  also placed into the ventral pancreatic duct. - GI recommend continue IV Protonix for now and rechecking liver enzymes in the morning. In regards to anticoagulation, GI stated "Ideally would be off any anticoagulation for next 48 hours. But in this setting needs are such that I would restart heparin @800PM  (6 hours from completion of procedure) with no bolus and titrate and monitor hemoglobin."   Otherwise, per primary team: - GERD - Iron deficiency anemia - Altered mental status   For questions or updates, please contact [facility] Please consult [email or web address] for contact info under        Signed, Parke PoissonGayatri A Deyra Perdomo, MD 03/16/2020, 8:12 PM

## 2020-03-24 NOTE — TOC Progression Note (Addendum)
Transition of Care (TOC) - Progression Note    Patient Details  Name: Jorge Evans. MRN: [id number] Date of Birth: 12/22/1950  Transition of Care [facility]) CM/SW Contact  Leone Haven, RN Phone Number: 04/02/2020, 5:38 PM  Clinical Narrative:    Transfer to from [facility], for ERCP today, continues on heparin and cardizem drip stopped on 12/22. Has jp drain, and biliary and pancreatic stents, has aifb, TOC team will continue to follow . PT is rec SNF.        Expected Discharge Plan and Services                                                 Social Determinants of Health (SDOH) Interventions    Readmission Risk Interventions No flowsheet data found.

## 2020-03-24 NOTE — Progress Notes (Signed)
OT Cancellation Note  Patient Details Name: Jorge Evans. MRN: [id number] DOB: [birth year]   Cancelled Treatment:    Reason Eval/Treat Not Completed: Patient at procedure or test/ unavailable Pt currently off unit for ERCP. Will follow-up as time allows and pt is appropriate.   [facility] OTR/L Acute Rehabilitation Services Office: [phone]   Rebeca Alert 03/05/2020, 11:14 AM

## 2020-03-24 NOTE — Anesthesia Preprocedure Evaluation (Signed)
Anesthesia Evaluation  Patient identified by MRN, date of birth, ID band Patient awake    Reviewed: Allergy & Precautions, H&P , NPO status , Patient's Chart, lab work & pertinent test results  Airway Mallampati: II  TM Distance: >3 FB Neck ROM: Full    Dental no notable dental hx. (+) Edentulous Upper, Edentulous Lower, Dental Advisory Given   Pulmonary neg pulmonary ROS, former smoker,    Pulmonary exam normal breath sounds clear to auscultation       Cardiovascular negative cardio ROS   Rhythm:Regular Rate:Normal     Neuro/Psych Depression negative neurological ROS     GI/Hepatic Neg liver ROS, GERD  Poorly Controlled,  Endo/Other  negative endocrine ROS  Renal/GU negative Renal ROS  negative genitourinary   Musculoskeletal   Abdominal   Peds  Hematology negative hematology ROS (+)   Anesthesia Other Findings   Reproductive/Obstetrics negative OB ROS                             Anesthesia Physical Anesthesia Plan  ASA: II  Anesthesia Plan: General   Post-op Pain Management:    Induction: Intravenous, Rapid sequence and Cricoid pressure planned  PONV Risk Score and Plan: 3 and Ondansetron, Dexamethasone and Midazolam  Airway Management Planned: Oral ETT  Additional Equipment:   Intra-op Plan:   Post-operative Plan: Extubation in OR  Informed Consent: I have reviewed the patients History and Physical, chart, labs and discussed the procedure including the risks, benefits and alternatives for the proposed anesthesia with the patient or authorized representative who has indicated his/her understanding and acceptance.     Dental advisory given  Plan Discussed with: CRNA  Anesthesia Plan Comments:         Anesthesia Quick Evaluation

## 2020-03-24 NOTE — Progress Notes (Signed)
Physical Therapy Treatment Patient Details Name: Jorge Evans. MRN: [id number] DOB: 10-30-1950 Today's Date: 03/06/2020    History of Present Illness Pt is a 69 y.o. male with PMH significant for lumbar surgery, ERCP 04-01-20 with biliary stent placement, and cholecystectomy 03/04/2020. He presented with progressive abdominal pain. Noted to have elevated transaminitis and total bilirubin.  CT abdomen/pelvis with IV contrast with intra-/extrahepatic biliary duct dilation with filling defects within the common duct concerning for choledocholithiasis. 12/16: cholecystectomy.  Drain placed for bile leak on 12/18.  ERCP planned for 21st    PT Comments    Pt sound asleep on entry, wife in room and supportive of waking pt up for therapy. Pt requires increased multimodal stimulation to wake up. Pt with increased grimace and noticeably bloated belly, however denies pain. Pt requires maxAx2 for bed mobility, mod Ax2 for transfers and lateral stepping towards HoB for return to bed to be ready for transport to sx for ERCP. Pt noted to still be in A-fib HR 103-143 during session. D/c recommendations remain appropriate. PT will continue to follow acutely.     Follow Up Recommendations  SNF;Supervision for mobility/OOB     Equipment Recommendations  None recommended by PT    Recommendations for Other Services OT consult     Precautions / Restrictions Precautions Precautions: Fall Precaution Comments: denies pain but grimaces especially with hiccups Restrictions Weight Bearing Restrictions: No    Mobility  Bed Mobility Overal bed mobility: Needs Assistance Bed Mobility: Rolling;Sidelying to Sit;Sit to Sidelying Rolling: Mod assist Sidelying to sit: +2 for physical assistance;Max assist     Sit to sidelying: Max assist General bed mobility comments: maxA for management of LE off bed and for trunk to upright, maxA for returning to supine through sidelying, vc for not rolling back before  his LE are back in bed  Transfers Overall transfer level: Needs assistance Equipment used: Rolling walker (2 wheeled) Transfers: Sit to/from Stand Sit to Stand: +2 safety/equipment;Mod assist         General transfer comment: reports he almost fell with using RW, assured him that therapy would not let that happen, pt with good start of power up but requires modAx2 for knee extension, pt with continued flexed posture and never able to fully come to upright.  Ambulation/Gait Ambulation/Gait assistance: +2 safety/equipment;Mod assist Gait Distance (Feet): 3 Feet Assistive device: Rolling walker (2 wheeled) Gait Pattern/deviations: Shuffle;Trunk flexed;Narrow base of support Gait velocity: Decreased Gait velocity interpretation: <1.31 ft/sec, indicative of household ambulator General Gait Details: modAx2 for physical support and maximal vc for stepping laterally towards HoB       Balance Overall balance assessment: Needs assistance Sitting-balance support: Bilateral upper extremity supported;Feet supported Sitting balance-Leahy Scale: Fair     Standing balance support: Bilateral upper extremity supported;During functional activity Standing balance-Leahy Scale: Poor Standing balance comment: Reliant on UE support.                            Cognition Arousal/Alertness: Awake/alert Behavior During Therapy: Flat affect (very flat, decreased phonation) Overall Cognitive Status: Impaired/Different from baseline Area of Impairment: Following commands;Safety/judgement;Awareness                       Following Commands: Follows one step commands with increased time;Follows multi-step commands inconsistently;Follows multi-step commands with increased time Safety/Judgement: Decreased awareness of safety Awareness: Emergent  General Comments General comments (skin integrity, edema, etc.): HR 96-143bpm during session, wife in room very supportive       Pertinent Vitals/Pain Pain Assessment: Faces Faces Pain Scale: Hurts whole lot Pain Location: Abdomen, noticibly bloated and painful with hiccups Pain Descriptors / Indicators: Operative site guarding;Grimacing;Moaning Pain Intervention(s): Limited activity within patient's tolerance;Monitored during session;Repositioned;Patient requesting pain meds-RN notified (requested acid reflux meds)           PT Goals (current goals can now be found in the care plan section) Acute Rehab PT Goals Patient Stated Goal: Be able to return home; decrease pain PT Goal Formulation: With patient/family Time For Goal Achievement: 04/05/20 Potential to Achieve Goals: Good Progress towards PT goals: Not progressing toward goals - comment (increased pain and weakness)    Frequency    Min 3X/week      PT Plan Current plan remains appropriate       AM-PAC PT "6 Clicks" Mobility   Outcome Measure  Help needed turning from your back to your side while in a flat bed without using bedrails?: None Help needed moving from lying on your back to sitting on the side of a flat bed without using bedrails?: A Little Help needed moving to and from a bed to a chair (including a wheelchair)?: A Little Help needed standing up from a chair using your arms (e.g., wheelchair or bedside chair)?: A Little Help needed to walk in hospital room?: A Little Help needed climbing 3-5 steps with a railing? : A Lot 6 Click Score: 18    End of Session Equipment Utilized During Treatment: Gait belt Activity Tolerance: Patient limited by pain;Patient limited by fatigue Patient left: with call bell/phone within reach;with family/visitor present;in bed;with bed alarm set (left with urinal placed in between his legs due to urgency but limited ability to urinate) Nurse Communication: Mobility status PT Visit Diagnosis: Unsteadiness on feet (R26.81);Pain;Muscle weakness (generalized) (M62.81);Difficulty in walking, not  elsewhere classified (R26.2) Pain - part of body:  (Abdomen)     Time: 9518-8416 PT Time Calculation (min) (ACUTE ONLY): 32 min  Charges:  $Therapeutic Activity: 23-37 mins                     Willo Yoon B. Beverely Risen PT, DPT Acute Rehabilitation Services Pager [phone] Office [phone]    Elon Alas Fleet 03/25/2020, 10:36 AM

## 2020-03-24 NOTE — Plan of Care (Signed)
  Problem: Education: Goal: Knowledge of General Education information will improve Description: Including pain rating scale, medication(s)/side effects and non-pharmacologic comfort measures Outcome: Progressing   Problem: Clinical Measurements: Goal: Ability to maintain clinical measurements within normal limits will improve Outcome: Progressing   Problem: Clinical Measurements: Goal: Diagnostic test results will improve Outcome: Progressing   Problem: Nutrition: Goal: Adequate nutrition will be maintained Outcome: Progressing   Problem: Pain Managment: Goal: General experience of comfort will improve Outcome: Progressing   Problem: Skin Integrity: Goal: Risk for impaired skin integrity will decrease Outcome: Progressing   Problem: Education: Goal: Understanding of medication regimen will improve Outcome: Progressing   Problem: Activity: Goal: Ability to tolerate increased activity will improve Outcome: Progressing

## 2020-03-25 ENCOUNTER — Other Ambulatory Visit: Payer: Self-pay | Admitting: Physician Assistant

## 2020-03-25 ENCOUNTER — Inpatient Hospital Stay (HOSPITAL_COMMUNITY): Payer: Medicare Other

## 2020-03-25 DIAGNOSIS — K8050 Calculus of bile duct without cholangitis or cholecystitis without obstruction: Secondary | ICD-10-CM | POA: Diagnosis not present

## 2020-03-25 LAB — CBC
HCT: 35.7 % — ABNORMAL LOW (ref 39.0–52.0)
Hemoglobin: 11.7 g/dL — ABNORMAL LOW (ref 13.0–17.0)
MCH: 28.7 pg (ref 26.0–34.0)
MCHC: 32.8 g/dL (ref 30.0–36.0)
MCV: 87.5 fL (ref 80.0–100.0)
Platelets: 403 10*3/uL — ABNORMAL HIGH (ref 150–400)
RBC: 4.08 MIL/uL — ABNORMAL LOW (ref 4.22–5.81)
RDW: 13.5 % (ref 11.5–15.5)
WBC: 16.3 10*3/uL — ABNORMAL HIGH (ref 4.0–10.5)
nRBC: 0 % (ref 0.0–0.2)

## 2020-03-25 LAB — COMPREHENSIVE METABOLIC PANEL
ALT: 77 U/L — ABNORMAL HIGH (ref 0–44)
AST: 93 U/L — ABNORMAL HIGH (ref 15–41)
Albumin: 1.9 g/dL — ABNORMAL LOW (ref 3.5–5.0)
Alkaline Phosphatase: 75 U/L (ref 38–126)
Anion gap: 13 (ref 5–15)
BUN: 21 mg/dL (ref 8–23)
CO2: 20 mmol/L — ABNORMAL LOW (ref 22–32)
Calcium: 7.3 mg/dL — ABNORMAL LOW (ref 8.9–10.3)
Chloride: 99 mmol/L (ref 98–111)
Creatinine, Ser: 1.02 mg/dL (ref 0.61–1.24)
GFR, Estimated: >60 mL/min (ref >60)
Glucose, Bld: 135 mg/dL — ABNORMAL HIGH (ref 70–99)
Potassium: 3.8 mmol/L (ref 3.5–5.1)
Sodium: 132 mmol/L — ABNORMAL LOW (ref 135–145)
Total Bilirubin: 3.2 mg/dL — ABNORMAL HIGH (ref 0.3–1.2)
Total Protein: 5.3 g/dL — ABNORMAL LOW (ref 6.5–8.1)

## 2020-03-25 LAB — HEPARIN LEVEL (UNFRACTIONATED)
Heparin Unfractionated: <0.1 IU/mL — ABNORMAL LOW (ref 0.30–0.70)
Heparin Unfractionated: <0.1 IU/mL — ABNORMAL LOW (ref 0.30–0.70)

## 2020-03-25 MED ORDER — METOPROLOL TARTRATE 5 MG/5ML IV SOLN
5.0000 mg | Freq: Once | INTRAVENOUS | Status: AC
  Administered 2020-03-25: 20:00:00 5 mg via INTRAVENOUS
  Filled 2020-03-25: qty 5

## 2020-03-25 MED ORDER — CHLORPROMAZINE HCL 10 MG PO TABS
10.0000 mg | ORAL_TABLET | Freq: Three times a day (TID) | ORAL | Status: DC | PRN
  Administered 2020-03-25 – 2020-03-26 (×2): 10 mg via ORAL
  Filled 2020-03-25 (×3): qty 1

## 2020-03-25 MED ORDER — BOOST / RESOURCE BREEZE PO LIQD CUSTOM
1.0000 | Freq: Three times a day (TID) | ORAL | Status: DC
  Administered 2020-03-25: 13:00:00 1 via ORAL

## 2020-03-25 MED ORDER — MORPHINE SULFATE (PF) 2 MG/ML IV SOLN
2.0000 mg | Freq: Once | INTRAVENOUS | Status: AC
  Administered 2020-03-25: 20:00:00 2 mg via INTRAVENOUS
  Filled 2020-03-25: qty 1

## 2020-03-25 MED ORDER — ALUM & MAG HYDROXIDE-SIMETH 200-200-20 MG/5ML PO SUSP
30.0000 mL | ORAL | Status: DC | PRN
  Administered 2020-03-25: 22:00:00 30 mL via ORAL
  Filled 2020-03-25: qty 30

## 2020-03-25 MED ORDER — OXYCODONE HCL 5 MG PO TABS
5.0000 mg | ORAL_TABLET | Freq: Four times a day (QID) | ORAL | Status: AC | PRN
  Administered 2020-03-25: 5 mg via ORAL
  Filled 2020-03-25: qty 1

## 2020-03-25 MED ORDER — SODIUM CHLORIDE 0.9 % IV BOLUS
500.0000 mL | Freq: Once | INTRAVENOUS | Status: AC
  Administered 2020-03-25: 20:00:00 500 mL via INTRAVENOUS

## 2020-03-25 MED ORDER — SODIUM CHLORIDE 0.9 % IV SOLN: INTRAVENOUS | Status: DC

## 2020-03-25 NOTE — Progress Notes (Signed)
OT Cancellation Note  Patient Details Name: Jorge Evans. MRN: [id number] DOB: [birth year]   Cancelled Treatment:    Reason Eval/Treat Not Completed: Fatigue/lethargy limiting ability to participate (Will follow.)  Evern Bio 03/25/2020, 9:33 AM  Martie Round, OTR/L Acute Rehabilitation Services Pager: [phone] Office: [phone]

## 2020-03-25 NOTE — Progress Notes (Signed)
error 

## 2020-03-25 NOTE — Progress Notes (Addendum)
At shift change, patient began c/o increasing abdominal pain. Day shift RN had given 5mg  oxycodone at 1851. Advised patient to give it some time for pain medication to take effect. Approx one hour later, patient called out saying pain was still 10/10 and now radiating to right upper chest. Patient's abdomen noted to be taut, firm and tender to touch. JP drain output at 1945 was 60cc of bilious fluid. JP drain dressing saturated. [facility] Dr. paged regarding this at 1949. EKG, 500cc NS fluid bolus, 5mg  metoprolol IV, and 2mg  morphine IV ordered.   Approx 45 mins later patient told this RN that pain was now at about 5/10 but still having right upper chest pain. Dr. Carren Rang paged. CT Abdomen ordered. PRN maalox ordered and given. Patient was also c/o some pain radiating to his back. Patient transported to CT at 2230.

## 2020-03-25 NOTE — Progress Notes (Signed)
ANTICOAGULATION CONSULT NOTE  Pharmacy Consult:  Heparin Indication: atrial fibrillation  Allergies  Allergen Reactions  . Other Itching  . Demerol [Meperidine] Rash    Patient Measurements: Height: 5\' 6"  (167.6 cm) Weight: 69.9 kg (154 lb) IBW/kg (Calculated) : 63.8 Heparin Dosing Weight: 69 kg  Vital Signs: Temp: 97.7 F (36.5 C) (12/22 1202) Temp Source: Oral (12/22 1202) BP: 107/73 (12/22 1300) Pulse Rate: 73 (12/22 1300)  Labs: Recent Labs    03/23/20 0232 03/23/20 0950 03/10/2020 0528 03/25/20 0401 03/25/20 1539  HGB 11.8*  --  11.4* 11.7*  --   HCT 36.2*  --  34.3* 35.7*  --   PLT 326  --  347 403*  --   HEPARINUNFRC <0.10*   < > <0.10* <0.10* <0.10*  CREATININE 0.93  --   --  1.02  --    < > = values in this interval not displayed.    Estimated Creatinine Clearance: 60.8 mL/min (by C-G formula based on SCr of 1.02 mg/dL).    Assessment: 34 yoM admitted with abdominal pain s/p ERCP 12/10, cholecystectomy 12/16, and drain placement 12/18.  Patient developed AFib and pharmacy consulted for heparin dosing.  Heparin level remains undetectable.  No issue with the pump nor interruption with heparin infusion per RN; however, there were some bubbles throughout the day.  No bleeding reported.  Goal of Therapy:  Heparin level 0.3-0.7 units/ml Monitor platelets by anticoagulation protocol: Yes   Plan:  Increase heparin gtt to 2100 units/hr, no bolus with recent surgery Check 8 hr heparin level Consider switching to Lovenox vs checking an AT3 level  Lysle Yero D. 1/19, PharmD, BCPS, BCCCP 03/25/2020, 5:32 PM

## 2020-03-25 NOTE — Progress Notes (Signed)
Pt's BP down to 91/46 MAP 55 at 1000, stopped Cardizem drip and hold metoprolol at this time. Patient is sleeping, but he didn't have symptoms of dizziness or light headed.  Dr. Garald Braver notified this matter and Dr. Garald Braver wanted to stop cardizem drip, but continue to give metoprolol po when pt's BP is stabilized. HS [employer]

## 2020-03-25 NOTE — Evaluation (Signed)
Occupational Therapy Evaluation Patient Details Name: Jorge Evans. MRN: [id number] DOB: 1950-06-30 Today's Date: 03/25/2020    History of Present Illness Pt is a 69 y.o. male with PMH significant for lumbar surgery, ERCP 04/06/2020 with biliary stent placement, and cholecystectomy 04/01/2020. He presented with progressive abdominal pain. Noted to have elevated transaminitis and total bilirubin.  CT abdomen/pelvis with IV contrast with intra-/extrahepatic biliary duct dilation with filling defects within the common duct concerning for choledocholithiasis. 12/16: cholecystectomy.  Drain placed for bile leak on 12/18.  ERCP 12/21.   Clinical Impression   Pt was independent prior to admission. Presents with generalized weakness and poor standing balance. He requires set up to max assist for ADL. Pt stood with 2 person min assist and took several steps along EOB. He declined up to chair, but had been up with nursing earlier today. Recommending further rehab in SNF at this point, but pt really wants to return home. His wife is supportive and in good health. Will follow acutely.    Follow Up Recommendations  SNF;Supervision/Assistance - 24 hour (pt is hopeful for home)    Equipment Recommendations  3 in 1 bedside commode    Recommendations for Other Services       Precautions / Restrictions Precautions Precautions: Fall Precaution Comments: JP drain      Mobility Bed Mobility Overal bed mobility: Needs Assistance Bed Mobility: Supine to Sit;Sit to Sidelying     Supine to sit: +2 for physical assistance;Mod assist   Sit to sidelying: Min assist General bed mobility comments: min assist for LEs, max for trunk to EOB, min for LEs back into bed    Transfers Overall transfer level: Needs assistance Equipment used: 2 person hand held assist Transfers: Sit to/from Stand Sit to Stand: +2 physical assistance;Min assist         General transfer comment: increased time, assist to  rise, steady and maintain while using urinal in standing    Balance Overall balance assessment: Needs assistance   Sitting balance-Leahy Scale: Fair Sitting balance - Comments: close supervision   Standing balance support: Bilateral upper extremity supported;During functional activity Standing balance-Leahy Scale: Poor Standing balance comment: Reliant on UE support.                           ADL either performed or assessed with clinical judgement   ADL Overall ADL's : Needs assistance/impaired Eating/Feeding: Set up;Sitting;Bed level   Grooming: Supervision/safety;Sitting;Wash/dry hands;Wash/dry face   Upper Body Bathing: Moderate assistance;Sitting   Lower Body Bathing: Maximal assistance;Sit to/from stand   Upper Body Dressing : Minimal assistance;Sitting   Lower Body Dressing: Maximal assistance;Sit to/from stand   Toilet Transfer: +2 for physical assistance;Minimal assistance   Toileting- Clothing Manipulation and Hygiene: Total assistance;+2 for physical assistance;Sit to/from stand Toileting - Clothing Manipulation Details (indicate cue type and reason): +2 to stand while wife managed urinal     Functional mobility during ADLs: +2 for physical assistance;Minimal assistance (for side steps along EOB)       Vision Patient Visual Report: No change from baseline       Perception     Praxis      Pertinent Vitals/Pain Pain Assessment: No/denies pain     Hand Dominance Left   Extremity/Trunk Assessment Upper Extremity Assessment Upper Extremity Assessment: Generalized weakness   Lower Extremity Assessment Lower Extremity Assessment: Defer to PT evaluation   Cervical / Trunk Assessment Cervical / Trunk Assessment: Other exceptions  Cervical / Trunk Exceptions: forward head, rounded shoulders   Communication Communication Communication: No difficulties   Cognition Arousal/Alertness: Awake/alert Behavior During Therapy: WFL for tasks  assessed/performed Overall Cognitive Status: Impaired/Different from baseline Area of Impairment: Awareness                         Safety/Judgement: Decreased awareness of safety;Decreased awareness of deficits     General Comments: pt stating he wants to be home by Christmas   General Comments       Exercises     Shoulder Instructions      Home Living Family/patient expects to be discharged to:: Private residence Living Arrangements: Spouse/significant other Available Help at Discharge: Family;Available 24 hours/day Type of Home: House Home Access: Stairs to enter [employer] of Steps: 5 Entrance Stairs-Rails: Left Home Layout: One level     Bathroom Shower/Tub: Chief Strategy Officer: Standard     Home Equipment: Environmental consultant - 2 wheels;Cane - single point;Wheelchair - manual          Prior Functioning/Environment Level of Independence: Independent                 OT Problem List: Decreased strength;Decreased activity tolerance;Impaired balance (sitting and/or standing);Decreased knowledge of use of DME or AE;Decreased cognition      OT Treatment/Interventions: Self-care/ADL training;DME and/or AE instruction;Therapeutic activities;Cognitive remediation/compensation;Patient/family education;Balance training    OT Goals(Current goals can be found in the care plan section) Acute Rehab OT Goals Patient Stated Goal: to be home by Christmas OT Goal Formulation: With patient Time For Goal Achievement: 04/08/20 Potential to Achieve Goals: Good ADL Goals Pt Will Perform Grooming: with min assist;standing Pt Will Perform Upper Body Dressing: with set-up;with supervision;sitting Pt Will Perform Lower Body Dressing: with min assist;sit to/from stand Pt Will Transfer to Toilet: with min assist;ambulating;bedside commode (over toilet) Pt Will Perform Toileting - Clothing Manipulation and hygiene: with min assist;sit to/from  stand Additional ADL Goal #1: Pt will perform bed mobility with min assist in preparation for ADL.  OT Frequency: Min 2X/week   Barriers to D/C:            Co-evaluation              AM-PAC OT "6 Clicks" Daily Activity     Outcome Measure Help from another person eating meals?: A Little Help from another person taking care of personal grooming?: A Little Help from another person toileting, which includes using toliet, bedpan, or urinal?: Total Help from another person bathing (including washing, rinsing, drying)?: A Lot Help from another person to put on and taking off regular upper body clothing?: A Little Help from another person to put on and taking off regular lower body clothing?: A Lot 6 Click Score: 14   End of Session Equipment Utilized During Treatment: Gait belt Nurse Communication: Mobility status  Activity Tolerance: Patient tolerated treatment well Patient left: in bed;with call bell/phone within reach;with family/visitor present;with nursing/sitter in room  OT Visit Diagnosis: Unsteadiness on feet (R26.81);Other abnormalities of gait and mobility (R26.89);Muscle weakness (generalized) (M62.81);Other symptoms and signs involving cognitive function                Time: 2130-8657 OT Time Calculation (min): 16 min Charges:  OT General Charges $OT Visit: 1 Visit OT Evaluation $OT Eval Moderate Complexity: 1 Mod  Martie Round, OTR/L Acute Rehabilitation Services Pager: [phone] Office: [phone]  Evern Bio 03/25/2020, 12:18 PM

## 2020-03-25 NOTE — Progress Notes (Addendum)
Progress Note  1 Day Post-Op  Subjective:  Patient reports some mild abdominal pain this AM. Tolerating CLD better overnight. Wife at bedside and had many questions regarding procedure yesterday and heart.   Objective: Vital signs in last 24 hours: Temp:  [97.6 F (36.4 C)-100.2 F (37.9 C)] 97.6 F (36.4 C) (12/22 0747) Pulse Rate:  [68-115] 78 (12/22 0747) Resp:  [16-36] 18 (12/22 0747) BP: (106-139)/(46-74) 113/74 (12/22 0747) SpO2:  [91 %-98 %] 91 % (12/22 0747) Last BM Date: 03/25/2020  Intake/Output from previous day: 12/21 0701 - 12/22 0700 In: 2221.6 [P.O.:270; I.V.:1552.8; IV Piggyback:398.8] Out: 1185 [Urine:800; Drains:335; Blood:50] Intake/Output this shift: Total I/O In: -  Out: 55 [Drains:55]  PE: General:more alert,WD,overweightmale, NAD Heart:irregularly irregular in the 80s Lungs: CTAB, no wheezes, rhonchi, or rales noted. Respiratory effort nonlabored Abd: soft, NT,moderately distended, incisions c/d/i, drain in RUQ with bilious fluid   Lab Results:  Recent Labs    03/16/2020 0528 03/25/20 0401  WBC 15.2* 16.3*  HGB 11.4* 11.7*  HCT 34.3* 35.7*  PLT 347 403*   BMET Recent Labs    03/23/20 0232 03/25/20 0401  NA 136 132*  K 3.9 3.8  CL 105 99  CO2 20* 20*  GLUCOSE 99 135*  BUN 24* 21  CREATININE 0.93 1.02  CALCIUM 7.7* 7.3*   PT/INR No results for input(s): LABPROT, INR in the last 72 hours. CMP     Component Value Date/Time   NA 132 (L) 03/25/2020 0401   K 3.8 03/25/2020 0401   CL 99 03/25/2020 0401   CO2 20 (L) 03/25/2020 0401   GLUCOSE 135 (H) 03/25/2020 0401   BUN 21 03/25/2020 0401   CREATININE 1.02 03/25/2020 0401   CREATININE 0.93 11/19/2019 0842   CALCIUM 7.3 (L) 03/25/2020 0401   PROT 5.3 (L) 03/25/2020 0401   ALBUMIN 1.9 (L) 03/25/2020 0401   AST 93 (H) 03/25/2020 0401   ALT 77 (H) 03/25/2020 0401   ALKPHOS 75 03/25/2020 0401   BILITOT 3.2 (H) 03/25/2020 0401   GFRNONAA >60 03/25/2020 0401   GFRAA >60  11/11/2016 1600   Lipase     Component Value Date/Time   LIPASE 20 03/11/2020 1452       Studies/Results: DG Abd 1 View  Result Date: 03/23/2020 CLINICAL DATA:  Bile duct obstruction.  Biliary stent position. EXAM: ABDOMEN - 1 VIEW COMPARISON:  03/17/2020 FINDINGS: Again noted is a biliary stent. This biliary stent is stable positioning with compared to prior study. The bowel gas pattern is nonspecific with some gaseous distention of loops of small bowel scattered throughout the abdomen. There is no evidence for high-grade bowel obstruction. There is a percutaneous drain projecting over the right upper quadrant. There is likely some pneumobilia which is stable from prior CT. There are chronic changes of the patient's proximal left femur. IMPRESSION: 1. Stable positioning of the biliary stent. 2. Nonspecific bowel gas pattern with gaseous distention of loops of small bowel scattered throughout the abdomen. Electronically Signed   By: Constance Holster M.D.   On: 03/23/2020 23:54   DG CHEST PORT 1 VIEW  Result Date: 03/23/2020 CLINICAL DATA:  Short of breath EXAM: PORTABLE CHEST 1 VIEW COMPARISON:  08/23/2019 FINDINGS: Single frontal view of the chest demonstrates an unremarkable cardiac silhouette. Her bibasilar areas of consolidation likely reflecting atelectasis. The right pleural effusion seen on recent CT is not well visualized. No pneumothorax. No acute bony abnormalities. IMPRESSION: 1. Bibasilar consolidation consistent with atelectasis. The right pleural  effusion seen on recent CT is not well visualized. Electronically Signed   By: Sharlet Salina M.D.   On: 03/23/2020 15:35   DG ERCP BILIARY & PANCREATIC DUCTS  Result Date: 03/23/2020 CLINICAL DATA:  Common bile duct obstruction EXAM: ERCP TECHNIQUE: Multiple spot images obtained with the fluoroscopic device and submitted for interpretation post-procedure. FLUOROSCOPY TIME:  Fluoroscopy Time:  13 minutes and 28 seconds Radiation  Exposure Index (if provided by the fluoroscopic device): 189.2 mGy Number of Acquired Spot Images: 20 COMPARISON:  CT dated 03/20/2020 FINDINGS: Initial images demonstrate a plastic biliary stent in place. Subsequent images demonstrate removal of the plastic biliary stent and cannulation of the common bile duct with injection of contrast. There are multiple filling defects within the common bile duct. This appears to have been followed by a balloon sweep and possible sphincterotomy. Final images demonstrate placement of a pancreatic stent and common bile duct stent. There was no definite extraluminal contrast visualized during the study. IMPRESSION: ERCP as above. These images were submitted for radiologic interpretation only. Please see the procedural report for the amount of contrast and the fluoroscopy time utilized. Electronically Signed   By: Katherine Mantle M.D.   On: 03/14/2020 14:43   ECHOCARDIOGRAM COMPLETE  Result Date: 03/23/2020    ECHOCARDIOGRAM REPORT   Patient Name:   Amado Andal. Date of Exam: 03/23/2020 Medical Rec #:  [id number]           Height:       66.0 in Accession #:    [id number]          Weight:       154.0 lb Date of Birth:  1950/08/27           BSA:          1.790 m Patient Age:    69 years            BP:           121/65 mmHg Patient Gender: M                   HR:           88 bpm. Exam Location:  Inpatient Procedure: 2D Echo and Intracardiac Opacification Agent Indications:    atrial fibrillation  History:        Patient has no prior history of Echocardiogram examinations.                 Arrythmias:Atrial Fibrillation.  Sonographer:    Delcie Roch Referring Phys: [id number] JESSICA U VANN IMPRESSIONS  1. Left ventricular ejection fraction, by estimation, is 65 to 70%. The left ventricle has normal function. The left ventricle has no regional wall motion abnormalities. Left ventricular diastolic function could not be evaluated.  2. Right ventricular systolic function is  normal. The right ventricular size is normal. There is normal pulmonary artery systolic pressure. The estimated right ventricular systolic pressure is 23.4 mmHg.  3. The mitral valve is grossly normal. Trivial mitral valve regurgitation.  4. The aortic valve is calcified. Aortic valve regurgitation is trivial. No aortic stenosis is present.  5. The inferior vena cava is normal in size with greater than 50% respiratory variability, suggesting right atrial pressure of 3 mmHg. Comparison(s): No prior Echocardiogram. FINDINGS  Left Ventricle: Left ventricular ejection fraction, by estimation, is 65 to 70%. The left ventricle has normal function. The left ventricle has no regional wall motion abnormalities. Definity contrast agent was given IV to  delineate the left ventricular  endocardial borders. The left ventricular internal cavity size was normal in size. There is no left ventricular hypertrophy. Left ventricular diastolic function could not be evaluated due to atrial fibrillation. Left ventricular diastolic function could  not be evaluated. Right Ventricle: The right ventricular size is normal. No increase in right ventricular wall thickness. Right ventricular systolic function is normal. There is normal pulmonary artery systolic pressure. The tricuspid regurgitant velocity is 2.26 m/s, and  with an assumed right atrial pressure of 3 mmHg, the estimated right ventricular systolic pressure is 40.9 mmHg. Left Atrium: Left atrial size was normal in size. Right Atrium: Right atrial size was normal in size. Pericardium: There is no evidence of pericardial effusion. Mitral Valve: The mitral valve is grossly normal. Trivial mitral valve regurgitation. Tricuspid Valve: The tricuspid valve is grossly normal. Tricuspid valve regurgitation is trivial. Aortic Valve: The aortic valve is calcified. Aortic valve regurgitation is trivial. No aortic stenosis is present. Pulmonic Valve: The pulmonic valve was normal in structure.  Pulmonic valve regurgitation is not visualized. Aorta: The aortic root and ascending aorta are structurally normal, with no evidence of dilitation. Venous: The inferior vena cava is normal in size with greater than 50% respiratory variability, suggesting right atrial pressure of 3 mmHg. IAS/Shunts: No atrial level shunt detected by color flow Doppler.  LEFT VENTRICLE PLAX 2D LVIDd:         4.20 cm LVIDs:         2.50 cm LV PW:         1.00 cm LV IVS:        1.10 cm LVOT diam:     2.00 cm LVOT Area:     3.14 cm  LEFT ATRIUM             Index       RIGHT ATRIUM           Index LA diam:        3.90 cm 2.18 cm/m  RA Area:     11.30 cm LA Vol (A2C):   29.1 ml 16.26 ml/m RA Volume:   20.20 ml  11.29 ml/m LA Vol (A4C):   40.4 ml 22.58 ml/m LA Biplane Vol: 37.7 ml 21.07 ml/m   AORTA Ao Root diam: 3.40 cm Ao Asc diam:  3.60 cm TRICUSPID VALVE TR Peak grad:   20.4 mmHg TR Vmax:        226.00 cm/s  SHUNTS Systemic Diam: 2.00 cm Lyman Bishop MD Electronically signed by Lyman Bishop MD Signature Date/Time: 03/23/2020/5:05:35 PM    Final     Anti-infectives: Anti-infectives (From admission, onward)   Start     Dose/Rate Route Frequency Ordered Stop   03/21/20 0200  piperacillin-tazobactam (ZOSYN) IVPB 3.375 g        3.375 g 12.5 mL/hr over 240 Minutes Intravenous Every 8 hours 03/21/20 0007     03/30/2020 0600  ceFAZolin (ANCEF) IVPB 2g/100 mL premix        2 g 200 mL/hr over 30 Minutes Intravenous On call to O.R. 03/18/20 1334 03/20/20 0559   03/10/2020 0600  piperacillin-tazobactam (ZOSYN) IVPB 3.375 g  Status:  Discontinued        3.375 g 12.5 mL/hr over 240 Minutes Intravenous Every 8 hours 03/26/2020 0046 03/15/20 1109   03/28/2020 2330  piperacillin-tazobactam (ZOSYN) IVPB 3.375 g        3.375 g 100 mL/hr over 30 Minutes Intravenous  Once 03/04/2020 2317 03/12/2020 0131  Assessment/Plan GERD IDA PMH Bilroth II for bleeding gastric ulcers ABL anemia - Hgb11.7, stable A. Fib with RVR - on  heparin and diltiazem gtts  Symptomatic cholelithiasis Choledocholithiasis without evidence of acute cholecystitis  S/p ERCP, placement of CBD stent 12/10 Dr. Benson Norway -unable to perform a sphincterotomy and stone extraction due to anatomy s/p Billroth II S/plaparoscopic cholecystectomy12/16 Dr. Grandville Silos Postoperative bile leak  - POD#6  - CT scan 12/17 revealed moderate volumeperihepatic free fluid containing foci of air with non organized fluid and air in the gallbladder fossaconcerning for biloma  - s/p IR drain placement 12/18 with yield high volume bilious fluid, culture pending  - s/p ERCP with sphincterotomy/sphincteroplasty, stent placement and lithotripsy 12/21 by Dr. Rush Landmark  - Tbili up some today at 3.2, but was not checked yesterday  - drain output remains bilious and 335 cc in last 24h - need to follow closely   - if LFTs improving and drain output coming down then should be able to avoid need for PTC. If not improving will need to readdress need for PTC with IR  - no other surgical recommendations at this time, we will be available but may follow more peripherally at this point   ID: [email or web address] 12/18>> FEN:IVF,CLD VTE: SCDs,heparin gtt  Foley:none  LOS: 12 days    Norm Parcel , [facility] 03/25/2020, 8:55 AM Please see Amion for pager number during day hours 7:00am-4:30pm  Agree with above. Wife at bedside.  Now that biliary system drained with sphincterotomy - intraabdominal bile drainage will decrease.  Alphonsa Overall, MD, [facility] Surgery Office phone:  [phone]

## 2020-03-25 NOTE — Progress Notes (Signed)
Initial Nutrition Assessment  DOCUMENTATION CODES:   Severe malnutrition in context of acute illness/injury  INTERVENTION:   Pt with complaints of nausea today and PO intake of clear liquids has been very poor. Pt with severe acute malnutrition. If unable to advance diet, recommend initiation of TPN.  Please obtain updated weight. Last weight is from 03/08/2020.  - Boost Breeze po TID, each supplement provides 250 kcal and 9 grams of protein  NUTRITION DIAGNOSIS:   Severe Malnutrition related to acute illness (choledocholithiasis s/p cholecystectomy) as evidenced by energy intake < or equal to 50% for > or equal to 5 days,mild fat depletion,moderate muscle depletion.  GOAL:   Patient will meet greater than or equal to 90% of their needs  MONITOR:   PO intake,Supplement acceptance,Diet advancement,Labs,Weight trends,Skin,I & O's  REASON FOR ASSESSMENT:   NPO/Clear Liquid Diet    ASSESSMENT:   69 year old male who presented to the ED on 12/09 with abdominal pain and N/V. PMH of GERD, IDA, s/p Billroth II. Admitted with choledocholithiasis.   12/10 - s/p ERCP with stent placement 12/16 - s/p laparoscopic cholecystectomy 12/18 - s/p CT guided drainage of perihepatic fluid collection and drain placement 12/21 - s/p repeat ERCP  Met with pt and wife at bedside. Pt experiencing nausea and hiccups that are not stopping no matter what he does. Noted pt with distended abdomen. Pt has only been able to take 2 sips of Boost Breeze supplement from this morning. Pt's wife repots pt last had solid food when he was "on the fifth floor." Per chart review, pt with poor PO intake even when he has been on a diet. Per chart, pt has been NPO or on clear liquid diet since 12/16.  Recommend initiation of nutrition support. If unable to advance diet past clear liquids, would recommend TPN. If pt able to have diet advanced and PO intake does not improve, would recommend placement of Cortrak NG tube and  initiation of enteral nutrition.  The majority of history was obtained from pt's wife as pt was uncomfortable at time of visit. Pt's wife reports that PTA pt had a good appetite and ate on and off throughout the day. A meal may include a sandwich. Pt's wife states that pt was not very active and mostly stayed in the house.  Pt reports his UBW is 145 lbs. Noted weight of 154 lbs on 12/09 appears stated rather than measured. No new weights since then. Recommend obtaining updated weight.  Meal Completion: 0% x last 8 documented meals  Medications reviewed and include: IV protonix, senna, sucralfate, cardizem gtt, heparin gtt, IV abs IVF: LR @ 50 ml/hr  Labs reviewed: sodium 132, elevated LFTs  UOP: 800 ml x 24 hours JP drain: 335 ml x 24 hours I/O's: +7.6 L since admit  NUTRITION - FOCUSED PHYSICAL EXAM:  Flowsheet Row Most Recent Value  Orbital Region Mild depletion  Upper Arm Region Mild depletion  Thoracic and Lumbar Region Unable to assess  Buccal Region Unable to assess  Temple Region Mild depletion  Clavicle Bone Region Moderate depletion  Clavicle and Acromion Bone Region Moderate depletion  Scapular Bone Region Unable to assess  Dorsal Hand Mild depletion  Patellar Region Unable to assess  Anterior Thigh Region Moderate depletion  Posterior Calf Region Mild depletion  Edema (RD Assessment) None  Hair Reviewed  Eyes Reviewed  Mouth Reviewed  Skin Reviewed  Nails Reviewed       Diet Order:   Diet Order  Diet clear liquid Room service appropriate? Yes; Fluid consistency: Thin  Diet effective now                 EDUCATION NEEDS:   No education needs have been identified at this time  Skin:  Skin Assessment: Skin Integrity Issues: Incisions: abdomen  Last BM:  03/11/2020 type 7  Height:   Ht Readings from Last 1 Encounters:  03/31/2020 5' 6" (1.676 m)    Weight:   Wt Readings from Last 1 Encounters:  03/17/2020 69.9 kg    BMI:  Body mass  index is 24.86 kg/m.  Estimated Nutritional Needs:   Kcal:  2000-2200  Protein:  90-110 grams  Fluid:  >/= 2.0 L    Gustavus Bryant, MS, RD, LDN Inpatient Clinical Dietitian Please see AMiON for contact information.

## 2020-03-25 NOTE — Anesthesia Postprocedure Evaluation (Signed)
Anesthesia Post Note  Patient: Jorge Evans.  Procedure(s) Performed: ENDOSCOPIC RETROGRADE CHOLANGIOPANCREATOGRAPHY (ERCP) (N/A ) SPHINCTEROTOMY BILIARY DILATION REMOVAL OF STONES STENT REMOVAL SUBMUCOSAL TATTOO INJECTION BIOPSY LITHOTRIPSY BILIARY STENT PLACEMENT PANCREATIC STENT PLACEMENT     Patient location during evaluation: PACU Anesthesia Type: General Level of consciousness: awake and alert Pain management: pain level controlled Vital Signs Assessment: post-procedure vital signs reviewed and stable Respiratory status: spontaneous breathing, nonlabored ventilation, respiratory function stable and patient connected to nasal cannula oxygen Cardiovascular status: blood pressure returned to baseline and stable Postop Assessment: no apparent nausea or vomiting Anesthetic complications: no   No complications documented.  Last Vitals:  Vitals:   03/25/20 1610 03/25/20 1943  BP: (!) 115/59 (!) 115/57  Pulse: 74 95  Resp: (!) 21 20  Temp: 36.8 C 36.6 C  SpO2: 93% 94%    Last Pain:  Vitals:   03/25/20 1943  TempSrc: Oral  PainSc: 8    Pain Goal: Patients Stated Pain Goal: 0 (03/25/20 1943)                 Kennieth Rad

## 2020-03-25 NOTE — Progress Notes (Addendum)
This RN notified Dr. Carren Rang that CT results were now available for her to view.  [facility] on-call MD Dr. Derrell Lolling also notified of CT results and patient condition.

## 2020-03-25 NOTE — Progress Notes (Signed)
Patient has hiccup whole day. Tried to have warm beef broth, but it didn't help in the afternoon. Asked Dr. Garald Braver for hiccup meds. Adminstered thorazine at 1542. It didn't work at this time. Pt's c/o nausea before having dinner which was advanced full liquid diet, Administered reglan 5 mg IVP at 1642. He still has nausea. Patient c/o sever pain on mid upper abdomen at 1800. Dr. Garald Braver notified this matter and received pain medication order. Administered oxy for pain at 1851. Made aware of night shift nurse this matter. Patient now c/o pain on Rt. Chest, at the same time his HR went up to 130's then went back down 80's -90's. HS [employer]

## 2020-03-25 NOTE — Progress Notes (Signed)
ANTICOAGULATION CONSULT NOTE Pharmacy Consult for heparin Indication: atrial fibrillation  Allergies  Allergen Reactions  . Other Itching  . Demerol [Meperidine] Rash    Patient Measurements: Height: 5\' 6"  (167.6 cm) Weight: 69.9 kg (154 lb) IBW/kg (Calculated) : 63.8 Heparin Dosing Weight: 69kg  Vital Signs: Temp: 98 F (36.7 C) (12/22 0342) Temp Source: Oral (12/22 0342) BP: 126/56 (12/22 0342) Pulse Rate: 79 (12/22 0342)  Labs: Recent Labs    03/23/20 0232 03/23/20 0950 03/23/20 1955 03/13/2020 0528 03/25/20 0401  HGB 11.8*  --   --  11.4* 11.7*  HCT 36.2*  --   --  34.3* 35.7*  PLT 326  --   --  347 403*  HEPARINUNFRC <0.10*   < > <0.10* <0.10* <0.10*  CREATININE 0.93  --   --   --  1.02   < > = values in this interval not displayed.    Estimated Creatinine Clearance: 60.8 mL/min (by C-G formula based on SCr of 1.02 mg/dL).    Assessment: 69 yo male with Afib s/p ERCP 12/21, for heparin.   Goal of Therapy:  Heparin level 0.3-0.7 units/ml Monitor platelets by anticoagulation protocol: Yes   Plan:  Increase Heparin 1850 units/hr Check heparin level in 8 hours.  1/22, PharmD, BCPS  03/25/2020

## 2020-03-25 NOTE — Progress Notes (Addendum)
Progress Note  Patient Name: Jorge Evans. Date of Encounter: 03/25/2020  [facility] Cardiologist: Parke PoissonGayatri A Spring San, MD  Subjective   No chest pain, shortness of breath or palpitations.  Inpatient Medications    Scheduled Meds:  acetaminophen  1,000 mg Oral Q8H   feeding supplement  1 Container Oral TID BM   lidocaine  1 patch Transdermal Q24H   lidocaine  1 application Urethral Once   metoprolol tartrate  25 mg Oral BID   pantoprazole (PROTONIX) IV  40 mg Intravenous Q12H   senna-docusate  2 tablet Oral BID   sucralfate  1 g Oral TID WC & HS   Continuous Infusions:  diltiazem (CARDIZEM) infusion Stopped (03/25/20 1028)   famotidine (PEPCID) IV     heparin 1,850 Units/hr (03/25/20 0747)   lactated ringers 50 mL/hr at 03/25/20 0747   piperacillin-tazobactam (ZOSYN)  IV 3.375 g (03/25/20 1314)   PRN Meds: acetaminophen **OR** acetaminophen, dicyclomine, metoCLOPramide (REGLAN) injection   Vital Signs    Vitals:   03/25/20 0342 03/25/20 0747 03/25/20 1202 03/25/20 1300  BP: (!) 126/56 113/74 121/68 107/73  Pulse: 79 78 80 73  Resp: 20 18 (!) 28 (!) 22  Temp: 98 F (36.7 C) 97.6 F (36.4 C) 97.7 F (36.5 C)   TempSrc: Oral Oral Oral   SpO2: 95% 91% 93% 95%  Weight:      Height:        Intake/Output Summary (Last 24 hours) at 03/25/2020 1339 Last data filed at 03/25/2020 1028 Gross per 24 hour  Intake 2383.93 ml  Output 1085 ml  Net 1298.93 ml   Last 3 Weights 03/06/2020 11/19/2019 08/23/2019  Weight (lbs) 154 lb 155 lb 161 lb  Weight (kg) 69.854 kg 70.308 kg 73.029 kg      Telemetry    Atrial fibrillation at 60-70s- Personally Reviewed  ECG    N/A  Physical Exam   GEN: No acute distress.   Neck: No JVD Cardiac:Ir Ir, no murmurs, rubs, or gallops.  Respiratory: Clear to auscultation bilaterally. GI: Soft, tender, non-distended  MS: No edema; No deformity. Neuro:  Nonfocal  Psych: Normal affect   Labs    High  Sensitivity Troponin:  No results for input(s): TROPONINIHS in the last 720 hours.    Chemistry Recent Labs  Lab 03/22/20 0224 03/23/20 0232 03/25/20 0401  NA 139 136 132*  K 4.0 3.9 3.8  CL 108 105 99  CO2 21* 20* 20*  GLUCOSE 106* 99 135*  BUN 29* 24* 21  CREATININE 1.40* 0.93 1.02  CALCIUM 7.8* 7.7* 7.3*  PROT 4.8* 5.0* 5.3*  ALBUMIN 1.8* 1.7* 1.9*  AST 57* 78* 93*  ALT 105* 90* 77*  ALKPHOS 68 73 75  BILITOT 1.6* 2.1* 3.2*  GFRNONAA 54* >60 >60  ANIONGAP 10 11 13      Hematology Recent Labs  Lab 03/23/20 0232 03/15/2020 0528 03/25/20 0401  WBC 12.0* 15.2* 16.3*  RBC 4.13* 3.99* 4.08*  HGB 11.8* 11.4* 11.7*  HCT 36.2* 34.3* 35.7*  MCV 87.7 86.0 87.5  MCH 28.6 28.6 28.7  MCHC 32.6 33.2 32.8  RDW 13.3 13.3 13.5  PLT 326 347 403*    BNP Recent Labs  Lab 03/23/20 0232  BNP 1,235.1*     Radiology    DG Abd 1 View  Result Date: 03/23/2020 CLINICAL DATA:  Bile duct obstruction.  Biliary stent position. EXAM: ABDOMEN - 1 VIEW COMPARISON:  03/17/2020 FINDINGS: Again noted is a biliary stent. This  biliary stent is stable positioning with compared to prior study. The bowel gas pattern is nonspecific with some gaseous distention of loops of small bowel scattered throughout the abdomen. There is no evidence for high-grade bowel obstruction. There is a percutaneous drain projecting over the right upper quadrant. There is likely some pneumobilia which is stable from prior CT. There are chronic changes of the patient's proximal left femur. IMPRESSION: 1. Stable positioning of the biliary stent. 2. Nonspecific bowel gas pattern with gaseous distention of loops of small bowel scattered throughout the abdomen. Electronically Signed   By: Katherine Mantle M.D.   On: 03/23/2020 23:54   DG CHEST PORT 1 VIEW  Result Date: 03/23/2020 CLINICAL DATA:  Short of breath EXAM: PORTABLE CHEST 1 VIEW COMPARISON:  08/23/2019 FINDINGS: Single frontal view of the chest demonstrates an  unremarkable cardiac silhouette. Her bibasilar areas of consolidation likely reflecting atelectasis. The right pleural effusion seen on recent CT is not well visualized. No pneumothorax. No acute bony abnormalities. IMPRESSION: 1. Bibasilar consolidation consistent with atelectasis. The right pleural effusion seen on recent CT is not well visualized. Electronically Signed   By: Sharlet Salina M.D.   On: 03/23/2020 15:35   DG ERCP BILIARY & PANCREATIC DUCTS  Result Date: 2020-04-05 CLINICAL DATA:  Common bile duct obstruction EXAM: ERCP TECHNIQUE: Multiple spot images obtained with the fluoroscopic device and submitted for interpretation post-procedure. FLUOROSCOPY TIME:  Fluoroscopy Time:  13 minutes and 28 seconds Radiation Exposure Index (if provided by the fluoroscopic device): 189.2 mGy Number of Acquired Spot Images: 20 COMPARISON:  CT dated 03/20/2020 FINDINGS: Initial images demonstrate a plastic biliary stent in place. Subsequent images demonstrate removal of the plastic biliary stent and cannulation of the common bile duct with injection of contrast. There are multiple filling defects within the common bile duct. This appears to have been followed by a balloon sweep and possible sphincterotomy. Final images demonstrate placement of a pancreatic stent and common bile duct stent. There was no definite extraluminal contrast visualized during the study. IMPRESSION: ERCP as above. These images were submitted for radiologic interpretation only. Please see the procedural report for the amount of contrast and the fluoroscopy time utilized. Electronically Signed   By: Katherine Mantle M.D.   On: 04/05/20 14:43   ECHOCARDIOGRAM COMPLETE  Result Date: 03/23/2020    ECHOCARDIOGRAM REPORT   Patient Name:   Jorge Evans. Date of Exam: 03/23/2020 Medical Rec #:  [id number]           Height:       66.0 in Accession #:    [id number]          Weight:       154.0 lb Date of Birth:  1950/12/19           BSA:           1.790 m Patient Age:    69 years            BP:           121/65 mmHg Patient Gender: M                   HR:           88 bpm. Exam Location:  Inpatient Procedure: 2D Echo and Intracardiac Opacification Agent Indications:    atrial fibrillation  History:        Patient has no prior history of Echocardiogram examinations.  Arrythmias:Atrial Fibrillation.  Sonographer:    Delcie Roch Referring Phys: [id number] JESSICA U VANN IMPRESSIONS  1. Left ventricular ejection fraction, by estimation, is 65 to 70%. The left ventricle has normal function. The left ventricle has no regional wall motion abnormalities. Left ventricular diastolic function could not be evaluated.  2. Right ventricular systolic function is normal. The right ventricular size is normal. There is normal pulmonary artery systolic pressure. The estimated right ventricular systolic pressure is 23.4 mmHg.  3. The mitral valve is grossly normal. Trivial mitral valve regurgitation.  4. The aortic valve is calcified. Aortic valve regurgitation is trivial. No aortic stenosis is present.  5. The inferior vena cava is normal in size with greater than 50% respiratory variability, suggesting right atrial pressure of 3 mmHg. Comparison(s): No prior Echocardiogram. FINDINGS  Left Ventricle: Left ventricular ejection fraction, by estimation, is 65 to 70%. The left ventricle has normal function. The left ventricle has no regional wall motion abnormalities. Definity contrast agent was given IV to delineate the left ventricular  endocardial borders. The left ventricular internal cavity size was normal in size. There is no left ventricular hypertrophy. Left ventricular diastolic function could not be evaluated due to atrial fibrillation. Left ventricular diastolic function could  not be evaluated. Right Ventricle: The right ventricular size is normal. No increase in right ventricular wall thickness. Right ventricular systolic function is normal.  There is normal pulmonary artery systolic pressure. The tricuspid regurgitant velocity is 2.26 m/s, and  with an assumed right atrial pressure of 3 mmHg, the estimated right ventricular systolic pressure is 23.4 mmHg. Left Atrium: Left atrial size was normal in size. Right Atrium: Right atrial size was normal in size. Pericardium: There is no evidence of pericardial effusion. Mitral Valve: The mitral valve is grossly normal. Trivial mitral valve regurgitation. Tricuspid Valve: The tricuspid valve is grossly normal. Tricuspid valve regurgitation is trivial. Aortic Valve: The aortic valve is calcified. Aortic valve regurgitation is trivial. No aortic stenosis is present. Pulmonic Valve: The pulmonic valve was normal in structure. Pulmonic valve regurgitation is not visualized. Aorta: The aortic root and ascending aorta are structurally normal, with no evidence of dilitation. Venous: The inferior vena cava is normal in size with greater than 50% respiratory variability, suggesting right atrial pressure of 3 mmHg. IAS/Shunts: No atrial level shunt detected by color flow Doppler.  LEFT VENTRICLE PLAX 2D LVIDd:         4.20 cm LVIDs:         2.50 cm LV PW:         1.00 cm LV IVS:        1.10 cm LVOT diam:     2.00 cm LVOT Area:     3.14 cm  LEFT ATRIUM             Index       RIGHT ATRIUM           Index LA diam:        3.90 cm 2.18 cm/m  RA Area:     11.30 cm LA Vol (A2C):   29.1 ml 16.26 ml/m RA Volume:   20.20 ml  11.29 ml/m LA Vol (A4C):   40.4 ml 22.58 ml/m LA Biplane Vol: 37.7 ml 21.07 ml/m   AORTA Ao Root diam: 3.40 cm Ao Asc diam:  3.60 cm TRICUSPID VALVE TR Peak grad:   20.4 mmHg TR Vmax:        226.00 cm/s  SHUNTS Systemic Diam: 2.00 cm  Zoila Shutter MD Electronically signed by Zoila Shutter MD Signature Date/Time: 03/23/2020/5:05:35 PM    Final     Cardiac Studies   Echo 03/23/2020 1. Left ventricular ejection fraction, by estimation, is 65 to 70%. The  left ventricle has normal function. The  left ventricle has no regional  wall motion abnormalities. Left ventricular diastolic function could not  be evaluated.  2. Right ventricular systolic function is normal. The right ventricular  size is normal. There is normal pulmonary artery systolic pressure. The  estimated right ventricular systolic pressure is 23.4 mmHg.  3. The mitral valve is grossly normal. Trivial mitral valve  regurgitation.  4. The aortic valve is calcified. Aortic valve regurgitation is trivial.  No aortic stenosis is present.  5. The inferior vena cava is normal in size with greater than 50%  respiratory variability, suggesting right atrial pressure of 3 mmHg.   Patient Profile     69 y.o. male  with a history of GERD, gastric ulcers (wife reports partial gastrectomy), and anemia but no known cardiac history who is being seen for the evaluation of atrial fibrillation with RVR in the setting of choledocholithiasis and recent cholecystectomy on 03-27-20.  Assessment & Plan    1. Atrial fibrillation with RVR - Likely due to acute illness - Treated with lopressor, IV heparin and Cardizem Gtt. His heparin held yesterday AM for repeat ERCP and then reseated in PM per GI recommendations.  - Echo showed LVEF of 65-70% with normal wall motion and no significant valvular disease.  -The patient was getting Cardizem drip at 15 mg/h however it was discontinued this morning around 10 AM due to soft blood pressure.  Currently, his heart rate stable 70.  Blood pressure stabilizing. -Continue metoprolol tartrate 25 mg twice daily>> can uptitrate for additional rate control as well -Continue to hold Cardizem can restart IV or p.o. based on blood pressure and heart rate -Continue heparin for anticoagulation and potential transition to NOAC at discharge -Can consider outpatient cardioversion  2. Symptomatic cholelithiasis  - Per surgery/GI  Will follow peripherally.  Medication recommendation as above.  Call with  questions. Please let card master know about appointment at discharge. Can be seen at afib clinic.   For questions or updates, please contact [facility] Please consult [email or web address] for contact info under   SignedManson Passey, PA  03/25/2020, 1:39 PM    Patient seen and examined with [facility] PA.  Agree as above, with the following exceptions and changes as noted below.  Continues to have discomfort with hiccups.  No chest pain. Gen: NAD, CV: Irregular rhythm normal rate, no murmurs, Lungs: clear, Abd: soft, Extrem: Warm, well perfused, no edema, Neuro/Psych: Drowsy and still remains confused all available labs, radiology testing, previous records reviewed.  Rates are in the 50s and 60s in atrial fibrillation at this time, diltiazem was discontinued this morning due to hypotension.  Would watch him closely and continue metoprolol for now, will not likely need titration of this medication currently given good rate control.  Rates may increase if future procedures are planned, would consider reinitiating diltiazem drip if he has poor rate control since he responded well to this initially.  We will follow peripherally.  Parke Poisson, MD 03/25/20 2:57 PM

## 2020-03-25 NOTE — Progress Notes (Signed)
Progress Note    Jorge Ponto.  TFT:[id number] DOB: Jul 10, 1950  DOA: 03/14/2020 PCP: Alycia Rossetti, MD    Brief Narrative:     Medical records reviewed and are as summarized below:  Jorge Ponto. is an 69 y.o. male with past medical history significant for GERD who presented with progressive abdominal pain.  Noted to have elevated transaminitis and total bilirubin.  CT abdomen/pelvis with IV contrast with intra-/extrahepatic biliary duct dilation with filling defects within the common duct concerning for choledocholithiasis.  Underwent ERCP on 03/10/2020, 3 large common bile duct stones noted, unable to perform a sphincterotomy and stone extraction due to anatomy s/p Billroth II; and had stent placement. 12/16: cholecystectomy.  Drain placed for bile leak on 12/18.  ERCP planned for 21st  Assessment/Plan:   Principal Problem:   Choledocholithiasis Active Problems:   GERD (gastroesophageal reflux disease)   Iron deficiency anemia   Elevated LFTs   Atrial fibrillation with RVR (HCC)   A fib with RVR- no prior h/o: CHadVAsc2: 2 (age/PVD) -TSH ok Echo unrevealing -IV cardizem titrate for HR 80-100 -add PO BB -heparin gtt -cards consult 03/25/20 Patient with hypotension this morning, cardizem drip held for this reason. BP improved, HR stable in the 70s. Metoprolol $RemoveBeforeD'25mg'[id number]$  po BID continued. He denies chest pain. Remains on heparin drip. Cardiology following, recommend uptitrating metoprolol dose if needed, may use Diltiazem again is needed. Continue telemetry, follow electrolytes closely.   Choledocholithiasis --Gastroenterology, Dr. Benson Norway --Zosyn --s/p cholecystectomy on 12/16 --Pain control and antiemetics -ambulate -ERCP planned for 12/21 by Dr. Rush Landmark  03/25/20 Remains on Zosyn. He has nausea but denies abdominal pain, remains on clear liquid diet. Surgery team recommends to continue monitoring drain output and LFTs, unclear at this time if patient will  need for PTC by IR.    hypokalemia -repleted 03/25/20 Potassium level normal today.   GERD --Carafate 1 g 4 times daily --Protonix 40 mg IV  Iron deficiency anemia -Fe  Hiccups Reported as persistent beyond 48 hours.  Will start low dose po thorazine and monitor.    Family Communication/Anticipated D/C date and plan/Code Status   DVT prophylaxis: heparin gtt Code Status: Full Code.  Disposition Plan: Status is: Inpatient Wife at bedside 12/21 Remains inpatient appropriate because:Inpatient level of care appropriate due to severity of illness   Dispo:  Patient From: Home  Planned Disposition: Home  Expected discharge date: Unclear at this time due to need for further medical evaluation.   Medically stable for discharge: No         Medical Consultants:    GI  GS  cards  Subjective:   Complaining of persistent hiccups. Intermittent GERD symptoms and nausea but tolerating clear liquid diet.  His wife was at the bedside, all questions answered.   Objective:    Vitals:   03/25/20 0342 03/25/20 0747 03/25/20 1202 03/25/20 1300  BP: (!) 126/56 113/74 121/68 107/73  Pulse: 79 78 80 73  Resp: 20 18 (!) 28 (!) 22  Temp: 98 F (36.7 C) 97.6 F (36.4 C) 97.7 F (36.5 C)   TempSrc: Oral Oral Oral   SpO2: 95% 91% 93% 95%  Weight:      Height:        Intake/Output Summary (Last 24 hours) at 03/25/2020 1639 Last data filed at 03/25/2020 1028 Gross per 24 hour  Intake 1868.86 ml  Output 1070 ml  Net 798.86 ml   Filed Weights   03/28/2020 1442  Weight: 69.9 kg    Exam:   General:    Chronically ill appearing male in no acute distress     Lungs:      respirations unlabored, no cough.   Heart:    Normal heart rate. irr    MS:   No edema or cyanosis.   Neurologic:   Awake, alert, cooperative     Data Reviewed:   I have personally reviewed following labs:  Labs: Labs show the following:   Basic Metabolic Panel: Recent Labs  Lab  03/21/20 0455 03/21/20 1535 03/22/20 0224 03/23/20 0232 03/25/20 0401  NA 139 139 139 136 132*  K 4.4 4.1 4.0 3.9 3.8  CL 109 108 108 105 99  CO2 18* 18* 21* 20* 20*  GLUCOSE 140* 131* 106* 99 135*  BUN 21 26* 29* 24* 21  CREATININE 2.00* 1.63* 1.40* 0.93 1.02  CALCIUM 7.8* 7.6* 7.8* 7.7* 7.3*  MG  --   --  1.9  --   --    GFR Estimated Creatinine Clearance: 60.8 mL/min (by C-G formula based on SCr of 1.02 mg/dL). Liver Function Tests: Recent Labs  Lab 03/20/20 0406 03/21/20 0455 03/22/20 0224 03/23/20 0232 03/25/20 0401  AST 274* 104* 57* 78* 93*  ALT 249* 185* 105* 90* 77*  ALKPHOS 100 89 68 73 75  BILITOT 1.8* 1.9* 1.6* 2.1* 3.2*  PROT 5.9* 5.5* 4.8* 5.0* 5.3*  ALBUMIN 2.4* 2.2* 1.8* 1.7* 1.9*   No results for input(s): LIPASE, AMYLASE in the last 168 hours. No results for input(s): AMMONIA in the last 168 hours. Coagulation profile No results for input(s): INR, PROTIME in the last 168 hours.  CBC: Recent Labs  Lab 03/21/20 0455 03/22/20 0224 03/23/20 0232 03/13/2020 0528 03/25/20 0401  WBC 20.0* 11.3* 12.0* 15.2* 16.3*  HGB 12.8* 11.1* 11.8* 11.4* 11.7*  HCT 39.6 32.2* 36.2* 34.3* 35.7*  MCV 89.0 86.8 87.7 86.0 87.5  PLT 254 235 326 347 403*   Cardiac Enzymes: No results for input(s): CKTOTAL, CKMB, CKMBINDEX, TROPONINI in the last 168 hours. BNP (last 3 results) No results for input(s): PROBNP in the last 8760 hours. CBG: No results for input(s): GLUCAP in the last 168 hours. D-Dimer: No results for input(s): DDIMER in the last 72 hours. Hgb A1c: No results for input(s): HGBA1C in the last 72 hours. Lipid Profile: No results for input(s): CHOL, HDL, LDLCALC, TRIG, CHOLHDL, LDLDIRECT in the last 72 hours. Thyroid function studies: Recent Labs    03/23/20 0232  TSH 2.804   Anemia work up: No results for input(s): VITAMINB12, FOLATE, FERRITIN, TIBC, IRON, RETICCTPCT in the last 72 hours. Sepsis Labs: Recent Labs  Lab 03/22/20 0224  03/23/20 0232 03/12/2020 0528 03/25/20 0401  WBC 11.3* 12.0* 15.2* 16.3*    Microbiology Recent Results (from the past 240 hour(s))  Aerobic/Anaerobic Culture (surgical/deep wound)     Status: Abnormal (Preliminary result)   Collection Time: 03/21/20 10:37 AM   Specimen: Abscess  Result Value Ref Range Status   Specimen Description ABSCESS  Final   Special Requests PERIHEPATIC BILOMA  Final   Gram Stain   Final    FEW WBC PRESENT, PREDOMINANTLY PMN ABUNDANT GRAM NEGATIVE RODS MODERATE GRAM POSITIVE COCCI IN PAIRS IN CHAINS Performed at [facility], [address] [place], [place] [address]    Culture (A)  Final    MULTIPLE ORGANISMS PRESENT, NONE PREDOMINANT NO ANAEROBES ISOLATED; CULTURE IN PROGRESS FOR 5 DAYS    Report Status PENDING  Incomplete  MRSA PCR Screening     Status: None   Collection Time: 03/22/20  8:57 PM   Specimen: Nasopharyngeal  Result Value Ref Range Status   MRSA by PCR NEGATIVE NEGATIVE Final    Comment:        The GeneXpert MRSA Assay (FDA approved for NASAL specimens only), is one component of a comprehensive MRSA colonization surveillance program. It is not intended to diagnose MRSA infection nor to guide or monitor treatment for MRSA infections. Performed at [facility], [place] [address] [place], [place] [address]     Procedures and diagnostic studies:  DG Abd 1 View  Result Date: 03/23/2020 CLINICAL DATA:  Bile duct obstruction.  Biliary stent position. EXAM: ABDOMEN - 1 VIEW COMPARISON:  03/17/2020 FINDINGS: Again noted is a biliary stent. This biliary stent is stable positioning with compared to prior study. The bowel gas pattern is nonspecific with some gaseous distention of loops of small bowel scattered throughout the abdomen. There is no evidence for high-grade bowel obstruction. There is a percutaneous drain projecting over the right upper quadrant. There is likely some pneumobilia which is stable from prior CT. There are  chronic changes of the patient's proximal left femur. IMPRESSION: 1. Stable positioning of the biliary stent. 2. Nonspecific bowel gas pattern with gaseous distention of loops of small bowel scattered throughout the abdomen. Electronically Signed   By: Constance Holster M.D.   On: 03/23/2020 23:54   DG ERCP BILIARY & PANCREATIC DUCTS  Result Date: 03/09/2020 CLINICAL DATA:  Common bile duct obstruction EXAM: ERCP TECHNIQUE: Multiple spot images obtained with the fluoroscopic device and submitted for interpretation post-procedure. FLUOROSCOPY TIME:  Fluoroscopy Time:  13 minutes and 28 seconds Radiation Exposure Index (if provided by the fluoroscopic device): 189.2 mGy Number of Acquired Spot Images: 20 COMPARISON:  CT dated 03/20/2020 FINDINGS: Initial images demonstrate a plastic biliary stent in place. Subsequent images demonstrate removal of the plastic biliary stent and cannulation of the common bile duct with injection of contrast. There are multiple filling defects within the common bile duct. This appears to have been followed by a balloon sweep and possible sphincterotomy. Final images demonstrate placement of a pancreatic stent and common bile duct stent. There was no definite extraluminal contrast visualized during the study. IMPRESSION: ERCP as above. These images were submitted for radiologic interpretation only. Please see the procedural report for the amount of contrast and the fluoroscopy time utilized. Electronically Signed   By: Constance Holster M.D.   On: 03/19/2020 14:43    Medications:   . acetaminophen  1,000 mg Oral Q8H  . feeding supplement  1 Container Oral TID BM  . lidocaine  1 patch Transdermal Q24H  . lidocaine  1 application Urethral Once  . metoprolol tartrate  25 mg Oral BID  . pantoprazole (PROTONIX) IV  40 mg Intravenous Q12H  . senna-docusate  2 tablet Oral BID  . sucralfate  1 g Oral TID WC & HS   Continuous Infusions: . famotidine (PEPCID) IV    . heparin  1,850 Units/hr (03/25/20 1541)  . lactated ringers 50 mL/hr at 03/25/20 1355  . piperacillin-tazobactam (ZOSYN)  IV 3.375 g (03/25/20 1314)    Time spent: 35 minutes   LOS: 12 days   Blain Pais  [facility]   How to contact the [facility] Attending or Consulting provider [place] or covering provider during after hours [place], for this patient?  1. Check the care team  in [facility] and look for a) attending/consulting [facility] provider listed and b) the [facility] team listed 2. Log into [email or web address] and use [place]'s universal password to access. If you do not have the password, please contact the hospital operator. 3. Locate the [facility] provider you are looking for under [facility] and page to a number that you can be directly reached. 4. If you still have difficulty reaching the provider, please page the [facility] (Director on Call) for the Hospitalists listed on amion for assistance.  03/25/2020, 4:39 PM

## 2020-03-25 NOTE — Plan of Care (Signed)
  Problem: Education: Goal: Knowledge of General Education information will improve Description: Including pain rating scale, medication(s)/side effects and non-pharmacologic comfort measures Outcome: Progressing   Problem: Clinical Measurements: Goal: Diagnostic test results will improve Outcome: Progressing   Problem: Activity: Goal: Risk for activity intolerance will decrease Outcome: Progressing   Problem: Nutrition: Goal: Adequate nutrition will be maintained Outcome: Progressing   Problem: Coping: Goal: Level of anxiety will decrease Outcome: Progressing   Problem: Pain Managment: Goal: General experience of comfort will improve Outcome: Progressing   Problem: Skin Integrity: Goal: Risk for impaired skin integrity will decrease Outcome: Progressing   Problem: Education: Goal: Knowledge of disease or condition will improve Outcome: Progressing   Problem: Education: Goal: Understanding of medication regimen will improve Outcome: Progressing   Problem: Activity: Goal: Ability to tolerate increased activity will improve Outcome: Progressing

## 2020-03-26 ENCOUNTER — Other Ambulatory Visit: Payer: Self-pay

## 2020-03-26 ENCOUNTER — Inpatient Hospital Stay (HOSPITAL_COMMUNITY): Payer: Medicare Other

## 2020-03-26 ENCOUNTER — Inpatient Hospital Stay (HOSPITAL_COMMUNITY): Payer: Medicare Other | Admitting: Certified Registered Nurse Anesthetist

## 2020-03-26 ENCOUNTER — Inpatient Hospital Stay (HOSPITAL_COMMUNITY): Payer: Medicare Other | Admitting: Certified Registered"

## 2020-03-26 ENCOUNTER — Inpatient Hospital Stay: Payer: Self-pay

## 2020-03-26 ENCOUNTER — Encounter (HOSPITAL_COMMUNITY): Admission: EM | Disposition: E | Payer: Self-pay | Source: Home / Self Care | Attending: Internal Medicine

## 2020-03-26 ENCOUNTER — Encounter (HOSPITAL_COMMUNITY): Payer: Self-pay | Admitting: Internal Medicine

## 2020-03-26 DIAGNOSIS — K8050 Calculus of bile duct without cholangitis or cholecystitis without obstruction: Secondary | ICD-10-CM | POA: Diagnosis not present

## 2020-03-26 DIAGNOSIS — E43 Unspecified severe protein-calorie malnutrition: Secondary | ICD-10-CM | POA: Insufficient documentation

## 2020-03-26 DIAGNOSIS — R0602 Shortness of breath: Secondary | ICD-10-CM

## 2020-03-26 HISTORY — PX: LAPAROSCOPY: SHX197

## 2020-03-26 LAB — AEROBIC/ANAEROBIC CULTURE W GRAM STAIN (SURGICAL/DEEP WOUND)

## 2020-03-26 LAB — COMPREHENSIVE METABOLIC PANEL
ALT: 97 U/L — ABNORMAL HIGH (ref 0–44)
AST: 121 U/L — ABNORMAL HIGH (ref 15–41)
Albumin: 1.6 g/dL — ABNORMAL LOW (ref 3.5–5.0)
Alkaline Phosphatase: 75 U/L (ref 38–126)
Anion gap: 10 (ref 5–15)
BUN: 16 mg/dL (ref 8–23)
CO2: 22 mmol/L (ref 22–32)
Calcium: 7.2 mg/dL — ABNORMAL LOW (ref 8.9–10.3)
Chloride: 101 mmol/L (ref 98–111)
Creatinine, Ser: 0.93 mg/dL (ref 0.61–1.24)
GFR, Estimated: >60 mL/min (ref >60)
Glucose, Bld: 114 mg/dL — ABNORMAL HIGH (ref 70–99)
Potassium: 3.7 mmol/L (ref 3.5–5.1)
Sodium: 133 mmol/L — ABNORMAL LOW (ref 135–145)
Total Bilirubin: 2.7 mg/dL — ABNORMAL HIGH (ref 0.3–1.2)
Total Protein: 4.7 g/dL — ABNORMAL LOW (ref 6.5–8.1)

## 2020-03-26 LAB — CBC
HCT: 32.6 % — ABNORMAL LOW (ref 39.0–52.0)
Hemoglobin: 10.8 g/dL — ABNORMAL LOW (ref 13.0–17.0)
MCH: 28.8 pg (ref 26.0–34.0)
MCHC: 33.1 g/dL (ref 30.0–36.0)
MCV: 86.9 fL (ref 80.0–100.0)
Platelets: 421 10*3/uL — ABNORMAL HIGH (ref 150–400)
RBC: 3.75 MIL/uL — ABNORMAL LOW (ref 4.22–5.81)
RDW: 13.4 % (ref 11.5–15.5)
WBC: 18.7 10*3/uL — ABNORMAL HIGH (ref 4.0–10.5)
nRBC: 0 % (ref 0.0–0.2)

## 2020-03-26 LAB — GLUCOSE, CAPILLARY: Glucose-Capillary: 97 mg/dL (ref 70–99)

## 2020-03-26 LAB — POCT I-STAT 7, (LYTES, BLD GAS, ICA,H+H)
Acid-base deficit: 3 mmol/L — ABNORMAL HIGH (ref 0.0–2.0)
Acid-base deficit: 4 mmol/L — ABNORMAL HIGH (ref 0.0–2.0)
Bicarbonate: 24 mmol/L (ref 20.0–28.0)
Bicarbonate: 22.3 mmol/L (ref 20.0–28.0)
Calcium, Ion: 1.07 mmol/L — ABNORMAL LOW (ref 1.15–1.40)
Calcium, Ion: 1.04 mmol/L — ABNORMAL LOW (ref 1.15–1.40)
HCT: 30 % — ABNORMAL LOW (ref 39.0–52.0)
HCT: 34 % — ABNORMAL LOW (ref 39.0–52.0)
Hemoglobin: 10.2 g/dL — ABNORMAL LOW (ref 13.0–17.0)
Hemoglobin: 11.6 g/dL — ABNORMAL LOW (ref 13.0–17.0)
O2 Saturation: 97 %
O2 Saturation: 91 %
Patient temperature: 36.9
Potassium: 3.7 mmol/L (ref 3.5–5.1)
Potassium: 3.8 mmol/L (ref 3.5–5.1)
Sodium: 134 mmol/L — ABNORMAL LOW (ref 135–145)
Sodium: 134 mmol/L — ABNORMAL LOW (ref 135–145)
TCO2: 26 mmol/L (ref 22–32)
TCO2: 24 mmol/L (ref 22–32)
pCO2 arterial: 50.6 mmHg — ABNORMAL HIGH (ref 32.0–48.0)
pCO2 arterial: 42.3 mmHg (ref 32.0–48.0)
pH, Arterial: 7.283 — ABNORMAL LOW (ref 7.350–7.450)
pH, Arterial: 7.33 — ABNORMAL LOW (ref 7.350–7.450)
pO2, Arterial: 99 mmHg (ref 83.0–108.0)
pO2, Arterial: 64 mmHg — ABNORMAL LOW (ref 83.0–108.0)

## 2020-03-26 LAB — HEPARIN LEVEL (UNFRACTIONATED): Heparin Unfractionated: 0.13 IU/mL — ABNORMAL LOW (ref 0.30–0.70)

## 2020-03-26 LAB — TYPE AND SCREEN
ABO/RH(D): O NEG
Antibody Screen: NEGATIVE

## 2020-03-26 LAB — SURGICAL PATHOLOGY

## 2020-03-26 LAB — LACTIC ACID, PLASMA: Lactic Acid, Venous: 1.3 mmol/L (ref 0.5–1.9)

## 2020-03-26 SURGERY — LAPAROSCOPY, DIAGNOSTIC
Anesthesia: General | Site: Abdomen

## 2020-03-26 MED ORDER — TRAVASOL 10 % IV SOLN
INTRAVENOUS | Status: AC
  Filled 2020-03-26: qty 302.4

## 2020-03-26 MED ORDER — DEXMEDETOMIDINE HCL IN NACL 400 MCG/100ML IV SOLN
0.4000 ug/kg/h | INTRAVENOUS | Status: DC
  Administered 2020-03-26: 23:00:00 1.2 ug/kg/h via INTRAVENOUS
  Administered 2020-03-26: 19:00:00 0.4 ug/kg/h via INTRAVENOUS
  Administered 2020-03-27 – 2020-03-30 (×15): 1.2 ug/kg/h via INTRAVENOUS
  Administered 2020-03-31: 10:00:00 1 ug/kg/h via INTRAVENOUS
  Administered 2020-03-31 (×2): 1.2 ug/kg/h via INTRAVENOUS
  Administered 2020-03-31 – 2020-04-01 (×3): 1 ug/kg/h via INTRAVENOUS
  Administered 2020-04-01: 0.8 ug/kg/h via INTRAVENOUS
  Administered 2020-04-02: 20:00:00 0.9 ug/kg/h via INTRAVENOUS
  Administered 2020-04-02 (×2): 0.8 ug/kg/h via INTRAVENOUS
  Administered 2020-04-03: 0.9 ug/kg/h via INTRAVENOUS
  Administered 2020-04-03: 0.8 ug/kg/h via INTRAVENOUS
  Administered 2020-04-03: 0.6 ug/kg/h via INTRAVENOUS
  Administered 2020-04-03: 0.8 ug/kg/h via INTRAVENOUS
  Administered 2020-04-04: 0.6 ug/kg/h via INTRAVENOUS
  Filled 2020-03-26 (×7): qty 100
  Filled 2020-03-26: qty 300
  Filled 2020-03-26: qty 100
  Filled 2020-03-26: qty 200
  Filled 2020-03-26: qty 100
  Filled 2020-03-26: qty 200
  Filled 2020-03-26 (×15): qty 100
  Filled 2020-03-26: qty 200
  Filled 2020-03-26 (×3): qty 100

## 2020-03-26 MED ORDER — FENTANYL CITRATE (PF) 250 MCG/5ML IJ SOLN
INTRAMUSCULAR | Status: DC | PRN
  Administered 2020-03-26 (×4): 50 ug via INTRAVENOUS

## 2020-03-26 MED ORDER — PHENYLEPHRINE 40 MCG/ML (10ML) SYRINGE FOR IV PUSH (FOR BLOOD PRESSURE SUPPORT)
PREFILLED_SYRINGE | INTRAVENOUS | Status: DC | PRN
  Administered 2020-03-26: 40 ug via INTRAVENOUS
  Administered 2020-03-26 (×2): 80 ug via INTRAVENOUS
  Administered 2020-03-26: 200 ug via INTRAVENOUS

## 2020-03-26 MED ORDER — FENTANYL CITRATE (PF) 100 MCG/2ML IJ SOLN
25.0000 ug | Freq: Once | INTRAMUSCULAR | Status: DC
Start: 2020-03-26 — End: 2020-03-26

## 2020-03-26 MED ORDER — DIGOXIN 0.25 MG/ML IJ SOLN
0.5000 mg | Freq: Once | INTRAMUSCULAR | Status: AC
  Administered 2020-03-26: 12:00:00 0.5 mg via INTRAVENOUS
  Filled 2020-03-26: qty 2

## 2020-03-26 MED ORDER — DEXAMETHASONE SODIUM PHOSPHATE 10 MG/ML IJ SOLN
INTRAMUSCULAR | Status: AC
  Filled 2020-03-26: qty 1

## 2020-03-26 MED ORDER — DILTIAZEM LOAD VIA INFUSION
10.0000 mg | Freq: Once | INTRAVENOUS | Status: AC
  Administered 2020-03-26: 09:00:00 10 mg via INTRAVENOUS
  Filled 2020-03-26 (×2): qty 10

## 2020-03-26 MED ORDER — LACTATED RINGERS IV SOLN: INTRAVENOUS | Status: DC

## 2020-03-26 MED ORDER — POLYETHYLENE GLYCOL 3350 17 G PO PACK
17.0000 g | PACK | Freq: Every day | ORAL | Status: DC
  Administered 2020-03-30 – 2020-04-03 (×5): 17 g
  Filled 2020-03-26 (×8): qty 1

## 2020-03-26 MED ORDER — VANCOMYCIN HCL 1500 MG/300ML IV SOLN
1500.0000 mg | INTRAVENOUS | Status: DC
  Filled 2020-03-26: qty 300

## 2020-03-26 MED ORDER — MIDAZOLAM HCL 2 MG/2ML IJ SOLN: 1.0000 mg | INTRAMUSCULAR | Status: DC | PRN

## 2020-03-26 MED ORDER — CHLORHEXIDINE GLUCONATE 0.12% ORAL RINSE (MEDLINE KIT)
15.0000 mL | Freq: Two times a day (BID) | OROMUCOSAL | Status: DC
  Administered 2020-03-26 – 2020-04-03 (×17): 15 mL via OROMUCOSAL

## 2020-03-26 MED ORDER — SUCCINYLCHOLINE CHLORIDE 20 MG/ML IJ SOLN
INTRAMUSCULAR | Status: DC | PRN
  Administered 2020-03-26: 150 mg via INTRAVENOUS

## 2020-03-26 MED ORDER — DIGOXIN 0.25 MG/ML IJ SOLN
0.2500 mg | Freq: Every day | INTRAMUSCULAR | Status: DC
  Administered 2020-03-27 – 2020-03-30 (×4): 0.25 mg via INTRAVENOUS
  Filled 2020-03-26 (×5): qty 2

## 2020-03-26 MED ORDER — FENTANYL CITRATE (PF) 250 MCG/5ML IJ SOLN
INTRAMUSCULAR | Status: AC
  Filled 2020-03-26: qty 5

## 2020-03-26 MED ORDER — SODIUM CHLORIDE 0.9 % IV SOLN
250.0000 mL | INTRAVENOUS | Status: DC
  Administered 2020-03-26 – 2020-04-03 (×3): 250 mL via INTRAVENOUS

## 2020-03-26 MED ORDER — ONDANSETRON HCL 4 MG/2ML IJ SOLN
INTRAMUSCULAR | Status: AC
  Filled 2020-03-26: qty 2

## 2020-03-26 MED ORDER — PHENYLEPHRINE 40 MCG/ML (10ML) SYRINGE FOR IV PUSH (FOR BLOOD PRESSURE SUPPORT)
PREFILLED_SYRINGE | INTRAVENOUS | Status: AC
  Filled 2020-03-26: qty 10

## 2020-03-26 MED ORDER — VANCOMYCIN HCL 1750 MG/350ML IV SOLN
1750.0000 mg | Freq: Once | INTRAVENOUS | Status: DC
  Filled 2020-03-26: qty 350

## 2020-03-26 MED ORDER — LACTATED RINGERS IV SOLN: INTRAVENOUS | Status: AC

## 2020-03-26 MED ORDER — SODIUM CHLORIDE 0.9% FLUSH
10.0000 mL | INTRAVENOUS | Status: DC | PRN
  Administered 2020-03-31: 18:00:00 10 mL

## 2020-03-26 MED ORDER — PHENYLEPHRINE HCL-NACL 10-0.9 MG/250ML-% IV SOLN
25.0000 ug/min | INTRAVENOUS | Status: DC
  Administered 2020-03-26: 110 ug/min via INTRAVENOUS
  Administered 2020-03-26: 18:00:00 50 ug/min via INTRAVENOUS
  Administered 2020-03-27: 02:00:00 140 ug/min via INTRAVENOUS
  Administered 2020-03-27: 17:00:00 180 ug/min via INTRAVENOUS
  Administered 2020-03-27: 05:00:00 120 ug/min via INTRAVENOUS
  Administered 2020-03-27: 03:00:00 100 ug/min via INTRAVENOUS
  Administered 2020-03-27: 13:00:00 175 ug/min via INTRAVENOUS
  Administered 2020-03-27 (×2): 60 ug/min via INTRAVENOUS
  Filled 2020-03-26 (×6): qty 250
  Filled 2020-03-26: qty 500
  Filled 2020-03-26 (×5): qty 250

## 2020-03-26 MED ORDER — DOCUSATE SODIUM 50 MG/5ML PO LIQD
100.0000 mg | Freq: Two times a day (BID) | ORAL | Status: DC
  Administered 2020-03-26: 23:00:00 100 mg
  Filled 2020-03-26 (×2): qty 10

## 2020-03-26 MED ORDER — LACTATED RINGERS IV SOLN: INTRAVENOUS | Status: DC | PRN

## 2020-03-26 MED ORDER — ALBUMIN HUMAN 5 % IV SOLN
12.5000 g | Freq: Once | INTRAVENOUS | Status: AC
  Administered 2020-03-26: 18:00:00 12.5 g via INTRAVENOUS
  Filled 2020-03-26: qty 250

## 2020-03-26 MED ORDER — CHLORHEXIDINE GLUCONATE CLOTH 2 % EX PADS
6.0000 | MEDICATED_PAD | Freq: Every day | CUTANEOUS | Status: DC
  Administered 2020-03-28 – 2020-04-03 (×8): 6 via TOPICAL

## 2020-03-26 MED ORDER — SUCCINYLCHOLINE CHLORIDE 20 MG/ML IJ SOLN
INTRAMUSCULAR | Status: DC | PRN
  Administered 2020-03-26: 100 mg via INTRAVENOUS

## 2020-03-26 MED ORDER — ALBUMIN HUMAN 5 % IV SOLN
INTRAVENOUS | Status: AC
  Filled 2020-03-26: qty 250

## 2020-03-26 MED ORDER — SODIUM CHLORIDE 0.9% FLUSH
10.0000 mL | Freq: Two times a day (BID) | INTRAVENOUS | Status: DC
  Administered 2020-03-26 – 2020-03-27 (×2): 20 mL
  Administered 2020-03-27 – 2020-03-28 (×3): 10 mL
  Administered 2020-03-29: 22:00:00 20 mL
  Administered 2020-03-29 – 2020-04-02 (×9): 10 mL
  Administered 2020-04-03: 20 mL
  Administered 2020-04-03: 10 mL

## 2020-03-26 MED ORDER — FENTANYL BOLUS VIA INFUSION: 25.0000 ug | INTRAVENOUS | Status: DC | PRN

## 2020-03-26 MED ORDER — VASOPRESSIN 20 UNIT/ML IV SOLN
INTRAVENOUS | Status: AC
  Filled 2020-03-26: qty 1

## 2020-03-26 MED ORDER — SODIUM CHLORIDE 0.9% FLUSH
5.0000 mL | Freq: Three times a day (TID) | INTRAVENOUS | Status: DC
  Administered 2020-03-26 – 2020-04-04 (×20): 5 mL

## 2020-03-26 MED ORDER — METOPROLOL TARTRATE 5 MG/5ML IV SOLN
INTRAVENOUS | Status: AC
  Filled 2020-03-26: qty 5

## 2020-03-26 MED ORDER — STERILE WATER FOR IRRIGATION IR SOLN
Status: DC | PRN
  Administered 2020-03-26: 1000 mL

## 2020-03-26 MED ORDER — PROPOFOL 10 MG/ML IV BOLUS
INTRAVENOUS | Status: DC | PRN
  Administered 2020-03-26: 100 mg via INTRAVENOUS

## 2020-03-26 MED ORDER — DIGOXIN 0.25 MG/ML IJ SOLN
0.2500 mg | Freq: Once | INTRAMUSCULAR | Status: AC
  Administered 2020-03-28: 11:00:00 0.25 mg via INTRAVENOUS
  Filled 2020-03-26: qty 1

## 2020-03-26 MED ORDER — CHLORHEXIDINE GLUCONATE 0.12 % MT SOLN
OROMUCOSAL | Status: AC
  Administered 2020-03-26: 15:00:00 15 mL via OROMUCOSAL
  Filled 2020-03-26: qty 15

## 2020-03-26 MED ORDER — SODIUM CHLORIDE 0.9 % IR SOLN
Status: DC | PRN
  Administered 2020-03-26: 1000 mL

## 2020-03-26 MED ORDER — PROPOFOL 10 MG/ML IV BOLUS
INTRAVENOUS | Status: DC | PRN
  Administered 2020-03-26: 180 mg via INTRAVENOUS
  Administered 2020-03-26: 10 mg via INTRAVENOUS

## 2020-03-26 MED ORDER — ALBUMIN HUMAN 5 % IV SOLN: INTRAVENOUS | Status: DC | PRN

## 2020-03-26 MED ORDER — FENTANYL CITRATE (PF) 100 MCG/2ML IJ SOLN
25.0000 ug | INTRAMUSCULAR | Status: DC | PRN
  Administered 2020-03-27: 100 ug via INTRAVENOUS
  Administered 2020-03-27: 50 ug via INTRAVENOUS
  Administered 2020-03-27 – 2020-03-30 (×3): 100 ug via INTRAVENOUS
  Administered 2020-03-31 (×2): 50 ug via INTRAVENOUS
  Administered 2020-03-31 (×2): 100 ug via INTRAVENOUS
  Administered 2020-04-01 (×2): 50 ug via INTRAVENOUS
  Filled 2020-03-26 (×6): qty 2

## 2020-03-26 MED ORDER — BUPIVACAINE HCL 0.25 % IJ SOLN
INTRAMUSCULAR | Status: DC | PRN
  Administered 2020-03-26: 5 mL

## 2020-03-26 MED ORDER — ONDANSETRON HCL 4 MG/2ML IJ SOLN
INTRAMUSCULAR | Status: DC | PRN
  Administered 2020-03-26: 4 mg via INTRAVENOUS

## 2020-03-26 MED ORDER — INSULIN ASPART 100 UNIT/ML ~~LOC~~ SOLN
0.0000 [IU] | Freq: Four times a day (QID) | SUBCUTANEOUS | Status: DC
  Administered 2020-03-27 (×2): 1 [IU] via SUBCUTANEOUS
  Administered 2020-03-27: 13:00:00 2 [IU] via SUBCUTANEOUS
  Administered 2020-03-28 – 2020-03-30 (×8): 1 [IU] via SUBCUTANEOUS

## 2020-03-26 MED ORDER — EPHEDRINE SULFATE-NACL 50-0.9 MG/10ML-% IV SOSY
PREFILLED_SYRINGE | INTRAVENOUS | Status: DC | PRN
  Administered 2020-03-26: 10 mg via INTRAVENOUS

## 2020-03-26 MED ORDER — LIDOCAINE 2% (20 MG/ML) 5 ML SYRINGE
INTRAMUSCULAR | Status: AC
  Filled 2020-03-26: qty 5

## 2020-03-26 MED ORDER — ESMOLOL HCL 100 MG/10ML IV SOLN
INTRAVENOUS | Status: AC
  Filled 2020-03-26: qty 10

## 2020-03-26 MED ORDER — PHENYLEPHRINE 40 MCG/ML (10ML) SYRINGE FOR IV PUSH (FOR BLOOD PRESSURE SUPPORT)
PREFILLED_SYRINGE | INTRAVENOUS | Status: DC | PRN
  Administered 2020-03-26: 120 ug via INTRAVENOUS
  Administered 2020-03-26: 80 ug via INTRAVENOUS
  Administered 2020-03-26: 120 ug via INTRAVENOUS

## 2020-03-26 MED ORDER — SUGAMMADEX SODIUM 200 MG/2ML IV SOLN
INTRAVENOUS | Status: DC | PRN
  Administered 2020-03-26: 200 mg via INTRAVENOUS
  Administered 2020-03-26: 100 mg via INTRAVENOUS

## 2020-03-26 MED ORDER — ROCURONIUM BROMIDE 100 MG/10ML IV SOLN
INTRAVENOUS | Status: DC | PRN
  Administered 2020-03-26: 70 mg via INTRAVENOUS

## 2020-03-26 MED ORDER — ORAL CARE MOUTH RINSE
15.0000 mL | OROMUCOSAL | Status: DC
  Administered 2020-03-26 – 2020-04-04 (×84): 15 mL via OROMUCOSAL

## 2020-03-26 MED ORDER — FENTANYL 2500MCG IN NS 250ML (10MCG/ML) PREMIX INFUSION: 25.0000 ug/h | INTRAVENOUS | Status: DC

## 2020-03-26 MED ORDER — LIDOCAINE 2% (20 MG/ML) 5 ML SYRINGE
INTRAMUSCULAR | Status: DC | PRN
  Administered 2020-03-26: 60 mg via INTRAVENOUS

## 2020-03-26 MED ORDER — LIDOCAINE 2% (20 MG/ML) 5 ML SYRINGE
INTRAMUSCULAR | Status: DC | PRN
  Administered 2020-03-26: 100 mg via INTRAVENOUS

## 2020-03-26 MED ORDER — CHLORHEXIDINE GLUCONATE 0.12 % MT SOLN: 15.0000 mL | Freq: Once | OROMUCOSAL | Status: AC

## 2020-03-26 MED ORDER — PHENYLEPHRINE HCL-NACL 10-0.9 MG/250ML-% IV SOLN
INTRAVENOUS | Status: DC | PRN
  Administered 2020-03-26: 40 ug/min via INTRAVENOUS

## 2020-03-26 MED ORDER — BUPIVACAINE HCL (PF) 0.25 % IJ SOLN
INTRAMUSCULAR | Status: AC
  Filled 2020-03-26: qty 30

## 2020-03-26 MED ORDER — ROCURONIUM BROMIDE 10 MG/ML (PF) SYRINGE
PREFILLED_SYRINGE | INTRAVENOUS | Status: AC
  Filled 2020-03-26: qty 10

## 2020-03-26 MED ORDER — MORPHINE SULFATE (PF) 2 MG/ML IV SOLN
2.0000 mg | INTRAVENOUS | Status: DC | PRN
  Administered 2020-03-26 (×6): 2 mg via INTRAVENOUS
  Filled 2020-03-26 (×7): qty 1

## 2020-03-26 MED ORDER — SODIUM CHLORIDE 0.9 % IV BOLUS
500.0000 mL | Freq: Once | INTRAVENOUS | Status: AC
  Administered 2020-03-26: 04:00:00 500 mL via INTRAVENOUS

## 2020-03-26 MED ORDER — EPHEDRINE 5 MG/ML INJ
INTRAVENOUS | Status: AC
  Filled 2020-03-26: qty 20

## 2020-03-26 MED ORDER — DILTIAZEM HCL-DEXTROSE 125-5 MG/125ML-% IV SOLN (PREMIX)
5.0000 mg/h | INTRAVENOUS | Status: DC
  Administered 2020-03-26: 09:00:00 5 mg/h via INTRAVENOUS
  Administered 2020-03-26: 20:00:00 12.5 mg/h via INTRAVENOUS
  Filled 2020-03-26 (×3): qty 125

## 2020-03-26 MED ORDER — 0.9 % SODIUM CHLORIDE (POUR BTL) OPTIME
TOPICAL | Status: DC | PRN
  Administered 2020-03-26 (×2): 1000 mL

## 2020-03-26 MED ORDER — SUCCINYLCHOLINE CHLORIDE 200 MG/10ML IV SOSY
PREFILLED_SYRINGE | INTRAVENOUS | Status: AC
  Filled 2020-03-26: qty 10

## 2020-03-26 MED ORDER — DEXAMETHASONE SODIUM PHOSPHATE 10 MG/ML IJ SOLN
INTRAMUSCULAR | Status: DC | PRN
  Administered 2020-03-26: 10 mg via INTRAVENOUS

## 2020-03-26 MED ORDER — METOPROLOL TARTRATE 5 MG/5ML IV SOLN
5.0000 mg | Freq: Once | INTRAVENOUS | Status: AC
  Administered 2020-03-26: 04:00:00 5 mg via INTRAVENOUS

## 2020-03-26 SURGICAL SUPPLY — 44 items
ADH SKN CLS APL DERMABOND .7 (GAUZE/BANDAGES/DRESSINGS) ×2
APL PRP STRL LF DISP 70% ISPRP (MISCELLANEOUS) ×2
BIOPATCH RED 1 DISK 7.0 (GAUZE/BANDAGES/DRESSINGS) ×2 IMPLANT
CHLORAPREP W/TINT 26 (MISCELLANEOUS) ×3 IMPLANT
COVER SURGICAL LIGHT HANDLE (MISCELLANEOUS) ×3 IMPLANT
DERMABOND ADVANCED (GAUZE/BANDAGES/DRESSINGS) ×1
DERMABOND ADVANCED .7 DNX12 (GAUZE/BANDAGES/DRESSINGS) ×1 IMPLANT
DRAIN CHANNEL 19F RND (DRAIN) ×2 IMPLANT
DRAPE WARM FLUID 44X44 (DRAPES) ×3 IMPLANT
DRSG TEGADERM 4X4.75 (GAUZE/BANDAGES/DRESSINGS) ×2 IMPLANT
ELECT CAUTERY BLADE 6.4 (BLADE) ×4 IMPLANT
ELECT REM PT RETURN 9FT ADLT (ELECTROSURGICAL) ×3
ELECTRODE REM PT RTRN 9FT ADLT (ELECTROSURGICAL) ×2 IMPLANT
EVACUATOR SILICONE 100CC (DRAIN) ×2 IMPLANT
GLOVE BIO SURGEON STRL SZ7 (GLOVE) ×3 IMPLANT
GLOVE BIOGEL PI IND STRL 7.5 (GLOVE) ×2 IMPLANT
GLOVE BIOGEL PI INDICATOR 7.5 (GLOVE) ×1
GOWN STRL REUS W/ TWL LRG LVL3 (GOWN DISPOSABLE) ×4 IMPLANT
GOWN STRL REUS W/TWL LRG LVL3 (GOWN DISPOSABLE) ×6
HANDLE SUCTION POOLE (INSTRUMENTS) ×2 IMPLANT
KIT BASIN OR (CUSTOM PROCEDURE TRAY) ×3 IMPLANT
KIT TURNOVER KIT B (KITS) ×3 IMPLANT
NS IRRIG 1000ML POUR BTL (IV SOLUTION) ×6 IMPLANT
PAD ARMBOARD 7.5X6 YLW CONV (MISCELLANEOUS) ×6 IMPLANT
PENCIL SMOKE EVACUATOR (MISCELLANEOUS) ×3 IMPLANT
SET IRRIG TUBING LAPAROSCOPIC (IRRIGATION / IRRIGATOR) ×2 IMPLANT
SET TUBE SMOKE EVAC HIGH FLOW (TUBING) ×3 IMPLANT
SLEEVE ENDOPATH XCEL 5M (ENDOMECHANICALS) ×3 IMPLANT
SPONGE LAP 18X18 RF (DISPOSABLE) ×2 IMPLANT
SUCTION POOLE HANDLE (INSTRUMENTS) ×3
SUT ETHILON 2 0 FS 18 (SUTURE) ×2 IMPLANT
SUT MNCRL AB 4-0 PS2 18 (SUTURE) ×3 IMPLANT
SUT SILK 2 0 SH CR/8 (SUTURE) ×3 IMPLANT
SUT SILK 2 0 TIES 10X30 (SUTURE) ×3 IMPLANT
SUT SILK 3 0 SH CR/8 (SUTURE) ×3 IMPLANT
SUT SILK 3 0 TIES 10X30 (SUTURE) ×3 IMPLANT
SYR CONTROL 10ML LL (SYRINGE) ×2 IMPLANT
TOWEL GREEN STERILE (TOWEL DISPOSABLE) ×3 IMPLANT
TOWEL GREEN STERILE FF (TOWEL DISPOSABLE) ×3 IMPLANT
TRAY FOLEY MTR SLVR 16FR STAT (SET/KITS/TRAYS/PACK) ×2 IMPLANT
TRAY LAPAROSCOPIC MC (CUSTOM PROCEDURE TRAY) ×3 IMPLANT
TROCAR XCEL BLUNT TIP 100MML (ENDOMECHANICALS) ×2 IMPLANT
TROCAR XCEL NON-BLD 5MMX100MML (ENDOMECHANICALS) ×3 IMPLANT
YANKAUER SUCT BULB TIP NO VENT (SUCTIONS) ×2 IMPLANT

## 2020-03-26 NOTE — Progress Notes (Addendum)
Progress Note  Patient Name: Jorge PiRaymond N Toledo Jr. Date of Encounter: 05-30-2019  [facility] Cardiologist: Parke PoissonGayatri A Amenda Duclos, MD   Subjective   Cardiology called in this morning due to elevated heart rate.  Patient is dealing with severe abdominal pain.  CT of abdomen abdominal free air.  Patient is spitting of blood this morning.  No chest pain.  Inpatient Medications    Scheduled Meds: . acetaminophen  1,000 mg Oral Q8H  . diltiazem  10 mg Intravenous Once  . feeding supplement  1 Container Oral TID BM  . lidocaine  1 patch Transdermal Q24H  . lidocaine  1 application Urethral Once  . metoprolol tartrate  25 mg Oral BID  . pantoprazole (PROTONIX) IV  40 mg Intravenous Q12H  . senna-docusate  2 tablet Oral BID  . sucralfate  1 g Oral TID WC & HS   Continuous Infusions: . diltiazem (CARDIZEM) infusion    . famotidine (PEPCID) IV    . heparin 2,350 Units/hr (March 30, 2020 0457)  . lactated ringers 100 mL/hr at March 30, 2020 0333  . piperacillin-tazobactam (ZOSYN)  IV 3.375 g (March 30, 2020 0545)   PRN Meds: acetaminophen **OR** acetaminophen, alum & mag hydroxide-simeth, chlorproMAZINE, dicyclomine, metoCLOPramide (REGLAN) injection, morphine injection, oxyCODONE   Vital Signs    Vitals:   03/25/20 2345 March 30, 2020 0312 March 30, 2020 0715 March 30, 2020 0800  BP: 131/80 120/82 (!) 134/57 117/74  Pulse: (!) 106 (!) 121 (!) 143 (!) 123  Resp: 20 17  18   Temp: 98.3 F (36.8 C) 98.1 F (36.7 C)  98.5 F (36.9 C)  TempSrc: Oral Oral    SpO2: 94% 93% 94% 94%  Weight:      Height:        Intake/Output Summary (Last 24 hours) at 05-30-2019 0850 Last data filed at 05-30-2019 0650 Gross per 24 hour  Intake 2246.95 ml  Output 1030 ml  Net 1216.95 ml   Last 3 Weights 03/06/2020 11/19/2019 08/23/2019  Weight (lbs) 154 lb 155 lb 161 lb  Weight (kg) 69.854 kg 70.308 kg 73.029 kg      Telemetry    Atrial fibrillation at rate of 130 to 140s- Personally Reviewed  ECG    Not  applicable  Physical Exam   GEN:  Ill-appearing male in no acute distress.   Neck: No JVD Cardiac: Irregular tachycardic, no murmurs, rubs, or gallops.  Respiratory: Clear to auscultation bilaterally. GI:  Distended MS:  Trace lower extremity edema left greater than right; No deformity. Neuro:  Nonfocal  Psych: Normal affect   Labs    Chemistry Recent Labs  Lab 03/23/20 0232 03/25/20 0401 March 30, 2020 0216  NA 136 132* 133*  K 3.9 3.8 3.7  CL 105 99 101  CO2 20* 20* 22  GLUCOSE 99 135* 114*  BUN 24* 21 16  CREATININE 0.93 1.02 0.93  CALCIUM 7.7* 7.3* 7.2*  PROT 5.0* 5.3* 4.7*  ALBUMIN 1.7* 1.9* 1.6*  AST 78* 93* 121*  ALT 90* 77* 97*  ALKPHOS 73 75 75  BILITOT 2.1* 3.2* 2.7*  GFRNONAA >60 >60 >60  ANIONGAP 11 13 10      Hematology Recent Labs  Lab 03/05/2020 0528 03/25/20 0401 March 30, 2020 0216  WBC 15.2* 16.3* 18.7*  RBC 3.99* 4.08* 3.75*  HGB 11.4* 11.7* 10.8*  HCT 34.3* 35.7* 32.6*  MCV 86.0 87.5 86.9  MCH 28.6 28.7 28.8  MCHC 33.2 32.8 33.1  RDW 13.3 13.5 13.4  PLT 347 403* 421*    BNP Recent Labs  Lab 03/23/20 0232  BNP 1,235.1*     Radiology    CT ABDOMEN PELVIS WO CONTRAST  Result Date: 03/25/2020 CLINICAL DATA:  Postoperative abdominal pain. EXAM: CT ABDOMEN AND PELVIS WITHOUT CONTRAST TECHNIQUE: Multidetector CT imaging of the abdomen and pelvis was performed following the standard protocol without IV contrast. COMPARISON:  None. FINDINGS: Lower chest: Mild areas of atelectasis and/or infiltrate are seen within the bilateral lung bases, right greater than left. There is a small right pleural effusion. Hepatobiliary: No focal liver abnormality is seen. A moderate amount of pneumobilia is noted. Status post cholecystectomy. A distal common bile duct stent is in place. Pancreas: A distal pancreatic duct stent is seen. This represents a new finding when compared to the prior study. The remaining visualized portions of the pancreas are unremarkable  Spleen: Normal in size without focal abnormality. Adrenals/Urinary Tract: Adrenal glands are unremarkable. Kidneys are normal, without renal calculi, focal lesion, or hydronephrosis. Bladder is unremarkable. Stomach/Bowel: There is a small hiatal hernia. Multiple surgical clips are seen along the gastroesophageal junction. Appendix appears normal. Multiple dilated small bowel loops are seen within the abdomen and pelvis. A gradual transition zone is seen within the mid to lower right abdomen. A moderate amount of intra-abdominal the free air is seen within the anterior aspect of the abdomen. This is increased in severity when compared to the prior study. Vascular/Lymphatic: Aortic atherosclerosis. No enlarged abdominal or pelvic lymph nodes. Reproductive: The prostate gland is mildly enlarged. Other: Moderate severity anasarca is seen along the lateral aspects of the abdominal and pelvic walls. A surgical drain is seen the entering via the anterolateral aspect of the abdomen on the right. Its distal end is seen adjacent to the lateral aspect of the right lobe of the liver. A trace amount of residual perihepatic fluid is seen within this region. A small amount of posterior pelvic free fluid is seen. Musculoskeletal: Degenerative changes seen throughout the lumbar spine. IMPRESSION: 1. Moderate amount of intra-abdominal free air, increased in severity when compared to the prior study. While this is likely secondary to the patient's recent surgical intervention, the presence of a perforated hollow viscus cannot completely be excluded. 2. Additional findings consistent with a postoperative ileus. 3. Right-sided surgical drain positioning, as described above, with near-total resolution of the perihepatic fluid seen on the prior study. 4. Interval distal pancreatic duct stent placement since the prior exam. 5. Moderate severity anasarca. 6. Small hiatal hernia. 7. Aortic atherosclerosis. Aortic Atherosclerosis  (ICD10-I70.0). Electronically Signed   By: Aram Candela M.D.   On: 03/25/2020 23:07   DG ERCP BILIARY & PANCREATIC DUCTS  Result Date: 03/30/2020 CLINICAL DATA:  Common bile duct obstruction EXAM: ERCP TECHNIQUE: Multiple spot images obtained with the fluoroscopic device and submitted for interpretation post-procedure. FLUOROSCOPY TIME:  Fluoroscopy Time:  13 minutes and 28 seconds Radiation Exposure Index (if provided by the fluoroscopic device): 189.2 mGy Number of Acquired Spot Images: 20 COMPARISON:  CT dated 03/20/2020 FINDINGS: Initial images demonstrate a plastic biliary stent in place. Subsequent images demonstrate removal of the plastic biliary stent and cannulation of the common bile duct with injection of contrast. There are multiple filling defects within the common bile duct. This appears to have been followed by a balloon sweep and possible sphincterotomy. Final images demonstrate placement of a pancreatic stent and common bile duct stent. There was no definite extraluminal contrast visualized during the study. IMPRESSION: ERCP as above. These images were submitted for radiologic interpretation only. Please see the procedural report for the  amount of contrast and the fluoroscopy time utilized. Electronically Signed   By: Katherine Mantle M.D.   On: 03/22/2020 14:43    Cardiac Studies   Echo 03/23/2020 1. Left ventricular ejection fraction, by estimation, is 65 to 70%. The  left ventricle has normal function. The left ventricle has no regional  wall motion abnormalities. Left ventricular diastolic function could not  be evaluated.  2. Right ventricular systolic function is normal. The right ventricular  size is normal. There is normal pulmonary artery systolic pressure. The  estimated right ventricular systolic pressure is 23.4 mmHg.  3. The mitral valve is grossly normal. Trivial mitral valve  regurgitation.  4. The aortic valve is calcified. Aortic valve regurgitation is  trivial.  No aortic stenosis is present.  5. The inferior vena cava is normal in size with greater than 50%  respiratory variability, suggesting right atrial pressure of 3 mmHg  Patient Profile     69 y.o. male with a historyof GERD, gastric ulcers (wife reports partial gastrectomy),and anemia but no known cardiac history who is being seen for the evaluation ofatrial fibrillation with RVR in the setting of choledocholithiasis and recent cholecystectomy on 04/02/2020.   Assessment & Plan    1. Atrial fibrillation with RVR - Likely due to acute illness - Treated with lopressor and Cardizem Gtt for rate control -Treated with IV heparin for anticoagulation and held intermittently for procedure/surgery - Echoshowed LVEF of 65-70% with normal wall motion and no significant valvular disease.- -His Cardizem was held December 22 secondary to hypotension.  At that time, his heart rate was stable at 70s -However since last night, his heart rate is gradually worsened and currently in 130s to 140s setting of acute abdominal discomfort and CT of abdomen showing increased free air -He will likely to have intermittent elevation of heart rate until resolution of abdominal issue  Recommendations:  -Restart Cardizem gtt and titrate based on heart rate and blood pressure -Continue metoprolol 25 mg twice daily -Patient's LV function is normal by echocardiogram.  If hypotensive, consider fluids if okay by primary team to support his blood pressure. -Amiodarone or digoxin could be other agents that can be used for additional rate control.  Patient currently anticoagulated with heparin however noted spitting up blood but not persistent.  Would not use amiodarone if required to stop heparin.  Discussed risk and benefit of stroke versus bleeding.    Dr. Jacques Navy to see.   2. Symptomatic cholelithiasis  - Per surgery/GI    For questions or updates, please contact [facility] Please consult  [email or web address] for contact info under        SignedManson Passey, PA  07-Apr-2020, 8:50 AM    Patient seen and examined with [facility] PA.  Agree as above, with the following exceptions and changes as noted below. Called this morning by Juanda Chance for afib RVR after stopping dilt gtt yesterday. Will resume. Gen: in pain, CV: irregular rhythm and tachycardic, soft systolic murmur, Lungs: clear, Abd: distended but not tense, Extrem: Warm, no edema, Neuro/Psych:somnolent but responsive.  All available labs, radiology testing, previous records reviewed. Agree with recommendations as above. Resume diltiazem with 10 mg bolus and monitor for hypotension. Will add digoxin for loading, since I assume his hospitalization may be protracted and he will continue to have atrial fibrillation until GI issues are better resolved.   If heparin needs to be stopped, this can be considered, however would keep on board for now. Hemoglobin is stable,  and amount of expectorated blood seems small volume at this time. Recommend rechecking H+H this afternoon.  Discussed in detail with his wife at the bedside.  Parke Poisson, MD 03/20/2020 10:35 AM

## 2020-03-26 NOTE — Progress Notes (Signed)
Peripherally Inserted Central Catheter Placement  The IV Nurse has discussed with the patient and/or persons authorized to consent for the patient, the purpose of this procedure and the potential benefits and risks involved with this procedure.  The benefits include less needle sticks, lab draws from the catheter, and the patient may be discharged home with the catheter. Risks include, but not limited to, infection, bleeding, blood clot (thrombus formation), and puncture of an artery; nerve damage and irregular heartbeat and possibility to perform a PICC exchange if needed/ordered by physician.  Alternatives to this procedure were also discussed.  Bard Power PICC patient education guide, fact sheet on infection prevention and patient information card has been provided to patient /or left at bedside.  Consent signed by wife  PICC Placement Documentation  PICC Triple Lumen 2020-04-08 PICC Right Brachial 42 cm 0 cm (Active)  Indication for Insertion or Continuance of Line Vasoactive infusions Apr 08, 2020 2045  Exposed Catheter (cm) 0 cm Apr 08, 2020 2045  Site Assessment Dry;Clean;Intact 04/08/2020 2045  Lumen #1 Status Flushed;Saline locked;Blood return noted 04-08-2020 2045  Lumen #2 Status Flushed;Saline locked;Blood return noted 2020-04-08 2045  Lumen #3 Status Flushed;Saline locked;Blood return noted 04/08/20 2045  Dressing Type Transparent 08-Apr-2020 2045  Dressing Status Clean;Dry;Intact April 08, 2020 2045  Antimicrobial disc in place? Yes 2020-04-08 2045  Dressing Intervention New dressing 04-08-2020 2045  Dressing Change Due 04/02/20 04-08-2020 2045       Ethelda Chick 04-08-20, 8:52 PM

## 2020-03-26 NOTE — Anesthesia Procedure Notes (Signed)
Arterial Line Insertion Performed by: Kamla Skilton E, MD, anesthesiologist  Preanesthetic checklist: patient identified, risks and benefits discussed and surgical consent Lidocaine 1% used for infiltration radial was placed Catheter size: 20 G Hand hygiene performed  and Seldinger technique used  Attempts: 1 Procedure performed using ultrasound guided technique. Ultrasound Notes:anatomy identified, needle tip was noted to be adjacent to the nerve/plexus identified and no ultrasound evidence of intravascular and/or intraneural injection Following insertion, dressing applied and Biopatch. Post procedure assessment: normal  Patient tolerated the procedure well with no immediate complications.     

## 2020-03-26 NOTE — Anesthesia Postprocedure Evaluation (Signed)
Anesthesia Post Note  Patient: Jorge Evans.  Procedure(s) Performed: LAPAROSCOPY DIAGNOSTIC (N/A Abdomen)     Patient location during evaluation: PACU Anesthesia Type: General Level of consciousness: awake and alert Pain management: pain level controlled Vital Signs Assessment: post-procedure vital signs reviewed and stable Respiratory status: respiratory function unstable Cardiovascular status: stable Anesthetic complications: yes Comments: Pt had aspiration on induction with postop hypoxia. Initially on BiPaP but having increased tachypnea and declining respiratory status. Will plan to reintubate prior to transfer to ICU.   Encounter Complications  Complication Outcome Phase Comment  Aspiration  Intraprocedure pt vomited large volume brown/red fluid immediately after induction, with evidence of aspiration (brown fluid suctioned from ETT after intubation, brown fluid on ETT after extubation)    Last Vitals:  Vitals:   03/11/2020 1715 03/21/2020 1730  BP: 110/74 139/67  Pulse: 93 94  Resp: (!) 28 (!) 32  Temp:    SpO2: 92% 95%    Last Pain:  Vitals:   03/05/2020 1715  TempSrc:   PainSc: Asleep                 Lucretia Kern

## 2020-03-26 NOTE — Progress Notes (Signed)
Pt placed on Bipap as requested by anaesthesia.

## 2020-03-26 NOTE — Progress Notes (Signed)
PHARMACY - TOTAL PARENTERAL NUTRITION CONSULT NOTE   Indication: severe malnutrition  Patient Measurements: Height: 5\' 6"  (167.6 cm) Weight: 69.9 kg (154 lb) IBW/kg (Calculated) : 63.8 TPN AdjBW (KG): 69.9 Body mass index is 24.86 kg/m. Usual Weight: 66 kg  Assessment:  69 yom who presented to the ED on 12/09 with abdominal pain and N/V. PMH for Bilroth II for bleeding gastric ulcers. He was found to have symptomatic cholelithiasis and is s/p ERCP with placement of CBD stent on 12/10 - unable to perform a sphincterotomy and stone extraction due to anatomy s/p Billroth II. He is s/p lap-chole on 12/16 which was complicated by a post-op bile leak. IR placed placed a drain on 12/18. Repeat ERCP with sphincterotomy/sphincteroplasty, stent placement and lithotripsy done on 12/21. Unable to advance diet due to nausea and hiccups and has been NPO or on clear liquids since 12/16. Pharmacy consulted for nutritional support for severe acute malnutrition.  Glucose / Insulin: no hx Electrolytes: Na low 133, all others WNL - at high risk for refeeding syndrome Renal: SCr <1 LFTs / TGs: AST 121/ALT 97, tbili trending down Prealbumin / albumin: albumin 1.6 Intake / Output; MIVF: UOP 0.6 ml/kg/hr, LR at 100 ml/hr, net +9.4L. R abd drain O/P 160 ml GI Imaging: Surgeries / Procedures:  12/10 s/p ERCP with stent placement 12/16 s/p laparoscopic cholecystectomy 12/18 s/p CT guided drainage of perihepatic fluid collection and drain placement 12/21 s/p repeat ERCP  Central access: PICC to be placed today TPN start date: 03/31/2020  Nutritional Goals (per RD recommendation on 12/22): kCal: 2000-2200, Protein: 90-110, Fluid: >=2L Goal TPN rate is 90 mL/hr (provides 90 g of protein and 2108 kcals per day)  Current Nutrition:  NPO and TPN  Plan:  Start TPN at 30 mL/hr at 1800 - this TPN provides 30 g of protein and 702 kcal providing ~33% of needs Electrolytes in TPN: 67mEq/L of Na, 55mEq/L of K, 69mEq/L  of Ca, 59mEq/L of Mg, and 60mmol/L of Phos. Cl:Ac ratio 1:1 Watch lytes closely due to risk for refeeding syndrome Add standard MVI and trace elements to TPN Initiate very Sensitive q6h SSI and adjust as needed  Reduce MIVF to 70 mL/hr at 1800 Monitor TPN labs on Mon/Thurs, labs tomorrow  Thank you for involving pharmacy in this patient's care.  12m, PharmD, BCPS Clinical Pharmacist Clinical phone for 03/25/2020 until 3p is 03/28/2020 03/19/2020 9:57 AM  **Pharmacist phone directory can be found on [email or web address] listed under [facility]**

## 2020-03-26 NOTE — Anesthesia Preprocedure Evaluation (Addendum)
Anesthesia Evaluation  Patient identified by MRN, date of birth, ID band Patient awake    Reviewed: Allergy & Precautions, NPO status , Patient's Chart, lab work & pertinent test results  History of Anesthesia Complications Negative for: history of anesthetic complications  Airway Mallampati: II  TM Distance: >3 FB Neck ROM: Full    Dental  (+) Edentulous Upper, Edentulous Lower   Pulmonary neg pulmonary ROS, former smoker,    Pulmonary exam normal        Cardiovascular Normal cardiovascular exam+ dysrhythmias Atrial Fibrillation      Neuro/Psych negative neurological ROS  negative psych ROS   GI/Hepatic Neg liver ROS, GERD  ,pneumoperitoneum   Endo/Other  negative endocrine ROS  Renal/GU negative Renal ROS  negative genitourinary   Musculoskeletal negative musculoskeletal ROS (+)   Abdominal   Peds  Hematology  (+) anemia ,   Anesthesia Other Findings  Echo 03/23/20: EF 65-70%, normal RV function, RVSP 23, valves unremarkable  Reproductive/Obstetrics                             Anesthesia Physical Anesthesia Plan  ASA: III and emergent  Anesthesia Plan: General   Post-op Pain Management:    Induction: Intravenous, Rapid sequence and Cricoid pressure planned  PONV Risk Score and Plan: 3 and Ondansetron, Dexamethasone, Treatment may vary due to age or medical condition and Midazolam  Airway Management Planned: Oral ETT  Additional Equipment: None  Intra-op Plan:   Post-operative Plan: Extubation in OR and Possible Post-op intubation/ventilation  Informed Consent: I have reviewed the patients History and Physical, chart, labs and discussed the procedure including the risks, benefits and alternatives for the proposed anesthesia with the patient or authorized representative who has indicated his/her understanding and acceptance.     Dental advisory given and Consent reviewed  with POA  Plan Discussed with:   Anesthesia Plan Comments:        Anesthesia Quick Evaluation

## 2020-03-26 NOTE — Progress Notes (Signed)
Nutrition Follow-up  DOCUMENTATION CODES:   Severe malnutrition in context of acute illness/injury  INTERVENTION:   - Please obtain updated weight  - TPN per pharmacy  Monitor magnesium, potassium, and phosphorus daily for at least 3 days, MD to replete as needed, as pt is at risk for refeeding syndrome.  NUTRITION DIAGNOSIS:   Severe Malnutrition related to acute illness (choledocholithiasis s/p cholecystectomy) as evidenced by energy intake < or equal to 50% for > or equal to 5 days,mild fat depletion,moderate muscle depletion.  Ongoing  GOAL:   Patient will meet greater than or equal to 90% of their needs  Progressing with initiation of TPN  MONITOR:   Diet advancement,Labs,Weight trends,Skin,I & O's  REASON FOR ASSESSMENT:   Consult New TPN/TNA  ASSESSMENT:   69 year old male who presented to the ED on 12/09 with abdominal pain and N/V. PMH of GERD, IDA, s/p Billroth II. Admitted with choledocholithiasis.  12/10 - s/p ERCP with CBD stent placement 12/16 - s/p laparoscopic cholecystectomy complicated by post-op bile leak 12/18 - s/p CT guided drainage of perihepatic fluid collection and drain placement 12/21 - s/p repeat ERCP with sphincterotomy/sphincteroplasty stent placement and lithotripsy  Per notes, pt with severe abdominal pain. Pt reporting diarrhea as well as bilious emesis this AM. Pt with possible new peritoneum. Plan for OR today for diagnostic laparoscopy and possible exploratory laparotomy today.  Pt is currently NPO with plan to start TPN today at 1800. Plan is to start TPN at 30 ml/hr to provide 702 kcal and 30 grams of protein. Goal rate is 90 ml/hr to provide 2108 kcal and 90 grams of protein. Pt on full liquid diet briefly overnight.  Medications reviewed and include: SSI q 6 hours, IV protonix, senna, sucralfate, cardizem gtt, heparin gtt, IV abx IVF: LR @ 100 ml/hr  Labs reviewed: sodium 133, elevated LFTs CBG's: 97  UOP: 925 ml x 24  hours Abdominal JP drain: 160 ml x 24 hours I/O's: +9.3 L since admit  Diet Order:   Diet Order            Diet NPO time specified Except for: Sips with Meds  Diet effective now                 EDUCATION NEEDS:   No education needs have been identified at this time  Skin:  Skin Assessment: Skin Integrity Issues: Incisions: abdomen  Last BM:  03/25/20 type 7  Height:   Ht Readings from Last 1 Encounters:  03/18/2020 5' 5.98" (1.676 m)    Weight:   Wt Readings from Last 1 Encounters:  03/05/2020 69.9 kg    BMI:  Body mass index is 24.87 kg/m.  Estimated Nutritional Needs:   Kcal:  2000-2200  Protein:  90-110 grams  Fluid:  >/= 2.0 L    Mertie Clause, MS, RD, LDN Inpatient Clinical Dietitian Please see AMiON for contact information.

## 2020-03-26 NOTE — Progress Notes (Signed)
ANTICOAGULATION CONSULT NOTE  Pharmacy Consult:  Heparin Indication: atrial fibrillation  Allergies  Allergen Reactions  . Other Itching  . Demerol [Meperidine] Rash    Patient Measurements: Height: 5\' 6"  (167.6 cm) Weight: 69.9 kg (154 lb) IBW/kg (Calculated) : 63.8 Heparin Dosing Weight: 69 kg  Vital Signs: Temp: 98.3 F (36.8 C) (12/22 2345) Temp Source: Oral (12/22 2345) BP: 131/80 (12/22 2345) Pulse Rate: 106 (12/22 2345)  Labs: Recent Labs    03/06/2020 0528 03/25/20 0401 03/25/20 1539 03/08/2020 0216  HGB 11.4* 11.7*  --  10.8*  HCT 34.3* 35.7*  --  32.6*  PLT 347 403*  --  421*  HEPARINUNFRC <0.10* <0.10* <0.10* 0.13*  CREATININE  --  1.02  --   --     Estimated Creatinine Clearance: 60.8 mL/min (by C-G formula based on SCr of 1.02 mg/dL).    Assessment: 60 yoM admitted with abdominal pain s/p ERCP 12/10, cholecystectomy 12/16, and drain placement 12/18.  Patient developed AFib and pharmacy consulted for heparin dosing.  Heparin level subtherapeutic (0.13) on gtt at 2100 units/hr. No issues with line or bleeding reported per RN.  Goal of Therapy:  Heparin level 0.3-0.7 units/ml Monitor platelets by anticoagulation protocol: Yes   Plan:  Increase heparin gtt to 2350 units/hr, no bolus with recent surgery Check 8 hr heparin level  1/19, PharmD, BCPS Please see amion for complete clinical pharmacist phone list 03/28/2020, 3:07 AM

## 2020-03-26 NOTE — Progress Notes (Signed)
PT Cancellation Note  Patient Details Name: Jorge Evans. MRN: [id number] DOB: [birth year]   Cancelled Treatment:    Reason Eval/Treat Not Completed: (P) Patient at procedure or test/unavailable (Pt off unit for surgical procedure. Will f/u per POC.)   Quinn Quam Artis Delay 03/06/2020, 2:32 PM  Bonney Leitz , PTA Acute Rehabilitation Services Pager [phone] Office [phone]

## 2020-03-26 NOTE — Progress Notes (Signed)
Referring Physician(s): Dr Raelyn Mora  Supervising Physician: Richarda Overlie  Patient Status:  [name] - In-pt  Chief Complaint:  Biloma drain - IR 03/21/20  Subjective:  Resting in bed Seems uncomfortable - although does not complain of pain Drain in intact Flushes easily OP bile--- 160 cc yesterday  ERCCP 12/21:  - LA Grade D esophagitis with bleeding throughout - biopsied. - 4 cm hiatal hernia. - Gastritis - biopsied. - Patent Billroth II gastrojejunostomy was found, characterized by friable mucosa at the anastomosis. - Efferent limb intubated and normal. - Afferent limb intubated and normal with visualization of ampulla and minor ampulla. Tattooed proximal aspect for demarcation purposes and future procedures.. - One visibly patent stent from the biliary tree was seen in the major papilla. - Filling defects consistent with stones and sludge were seen on the cholangiogram. - The entire main bile duct was moderately dilated as a result of the above. - Choledocholithiasis and biliary sludge was found. Partial removal was accomplished with biliary precut sphincterotomy and subsequent balloon sphincteroplasty and subsequent balloown trawl. Further stone material required mechnical lithotripsy which was partially successful. Eventually a stent was inserted due to known biliary leak into the bile duct. - One plastic pancreatic stent was placed into the ventral pancreatic duct.  Allergies: Other and Demerol [meperidine]  Medications: Prior to Admission medications   Medication Sig Start Date End Date Taking? Authorizing Provider  Cholecalciferol (VITAMIN D) 50 MCG (2000 UT) CAPS Take 1 capsule (2,000 Units total) by mouth daily. 01/21/20  Yes [place], Velna Hatchet, MD  ferrous sulfate 325 (65 FE) MG tablet Take 1 tablet (325 mg total) by mouth 2 (two) times daily with a meal. Patient taking differently: Take 325 mg by mouth daily with breakfast. 08/28/19  Yes [place], Velna Hatchet, MD   ondansetron (ZOFRAN) 4 MG tablet TAKE 1 TABLET BY MOUTH EVERY 8 HOURS AS NEEDED FOR NAUSEA AND VOMITING Patient taking differently: Take 4 mg by mouth every 8 (eight) hours as needed for nausea or vomiting. 01/21/20  Yes [place], Velna Hatchet, MD  sucralfate (CARAFATE) 1 g tablet TAKE 1 TABLET (1 G TOTAL) BY MOUTH 4 (FOUR) TIMES DAILY - WITH MEALS AND AT BEDTIME. 12/30/19  Yes [place], Velna Hatchet, MD     Vital Signs: BP 120/82 (BP Location: Right Arm)   Pulse (!) 121   Temp 98.1 F (36.7 C) (Oral)   Resp 17   Ht 5\' 6"  (1.676 m)   Wt 154 lb (69.9 kg)   SpO2 93%   BMI 24.86 kg/m   Physical Exam Abdominal:     Tenderness: There is no abdominal tenderness.     Comments: abd seems tight  Skin:    General: Skin is warm.     Comments: Site of biloma drain is c/d/i OP bile 1600 cc yesterday Flushes easily       Imaging: CT ABDOMEN PELVIS WO CONTRAST  Result Date: 03/25/2020 CLINICAL DATA:  Postoperative abdominal pain. EXAM: CT ABDOMEN AND PELVIS WITHOUT CONTRAST TECHNIQUE: Multidetector CT imaging of the abdomen and pelvis was performed following the standard protocol without IV contrast. COMPARISON:  None. FINDINGS: Lower chest: Mild areas of atelectasis and/or infiltrate are seen within the bilateral lung bases, right greater than left. There is a small right pleural effusion. Hepatobiliary: No focal liver abnormality is seen. A moderate amount of pneumobilia is noted. Status post cholecystectomy. A distal common bile duct stent is in place. Pancreas: A distal pancreatic duct stent is seen. This  represents a new finding when compared to the prior study. The remaining visualized portions of the pancreas are unremarkable Spleen: Normal in size without focal abnormality. Adrenals/Urinary Tract: Adrenal glands are unremarkable. Kidneys are normal, without renal calculi, focal lesion, or hydronephrosis. Bladder is unremarkable. Stomach/Bowel: There is a small hiatal hernia. Multiple surgical  clips are seen along the gastroesophageal junction. Appendix appears normal. Multiple dilated small bowel loops are seen within the abdomen and pelvis. A gradual transition zone is seen within the mid to lower right abdomen. A moderate amount of intra-abdominal the free air is seen within the anterior aspect of the abdomen. This is increased in severity when compared to the prior study. Vascular/Lymphatic: Aortic atherosclerosis. No enlarged abdominal or pelvic lymph nodes. Reproductive: The prostate gland is mildly enlarged. Other: Moderate severity anasarca is seen along the lateral aspects of the abdominal and pelvic walls. A surgical drain is seen the entering via the anterolateral aspect of the abdomen on the right. Its distal end is seen adjacent to the lateral aspect of the right lobe of the liver. A trace amount of residual perihepatic fluid is seen within this region. A small amount of posterior pelvic free fluid is seen. Musculoskeletal: Degenerative changes seen throughout the lumbar spine. IMPRESSION: 1. Moderate amount of intra-abdominal free air, increased in severity when compared to the prior study. While this is likely secondary to the patient's recent surgical intervention, the presence of a perforated hollow viscus cannot completely be excluded. 2. Additional findings consistent with a postoperative ileus. 3. Right-sided surgical drain positioning, as described above, with near-total resolution of the perihepatic fluid seen on the prior study. 4. Interval distal pancreatic duct stent placement since the prior exam. 5. Moderate severity anasarca. 6. Small hiatal hernia. 7. Aortic atherosclerosis. Aortic Atherosclerosis (ICD10-I70.0). Electronically Signed   By: Aram Candelahaddeus  [place] M.D.   On: 03/25/2020 23:07   DG Abd 1 View  Result Date: 03/23/2020 CLINICAL DATA:  Bile duct obstruction.  Biliary stent position. EXAM: ABDOMEN - 1 VIEW COMPARISON:  03/17/2020 FINDINGS: Again noted is a biliary  stent. This biliary stent is stable positioning with compared to prior study. The bowel gas pattern is nonspecific with some gaseous distention of loops of small bowel scattered throughout the abdomen. There is no evidence for high-grade bowel obstruction. There is a percutaneous drain projecting over the right upper quadrant. There is likely some pneumobilia which is stable from prior CT. There are chronic changes of the patient's proximal left femur. IMPRESSION: 1. Stable positioning of the biliary stent. 2. Nonspecific bowel gas pattern with gaseous distention of loops of small bowel scattered throughout the abdomen. Electronically Signed   By: Katherine Mantlehristopher  Green M.D.   On: 03/23/2020 23:54   DG CHEST PORT 1 VIEW  Result Date: 03/23/2020 CLINICAL DATA:  Short of breath EXAM: PORTABLE CHEST 1 VIEW COMPARISON:  08/23/2019 FINDINGS: Single frontal view of the chest demonstrates an unremarkable cardiac silhouette. Her bibasilar areas of consolidation likely reflecting atelectasis. The right pleural effusion seen on recent CT is not well visualized. No pneumothorax. No acute bony abnormalities. IMPRESSION: 1. Bibasilar consolidation consistent with atelectasis. The right pleural effusion seen on recent CT is not well visualized. Electronically Signed   By: Sharlet SalinaMichael  Brown M.D.   On: 03/23/2020 15:35   DG ERCP BILIARY & PANCREATIC DUCTS  Result Date: 03/28/2020 CLINICAL DATA:  Common bile duct obstruction EXAM: ERCP TECHNIQUE: Multiple spot images obtained with the fluoroscopic device and submitted for interpretation post-procedure. FLUOROSCOPY TIME:  Fluoroscopy Time:  13 minutes and 28 seconds Radiation Exposure Index (if provided by the fluoroscopic device): 189.2 mGy Number of Acquired Spot Images: 20 COMPARISON:  CT dated 03/20/2020 FINDINGS: Initial images demonstrate a plastic biliary stent in place. Subsequent images demonstrate removal of the plastic biliary stent and cannulation of the common bile  duct with injection of contrast. There are multiple filling defects within the common bile duct. This appears to have been followed by a balloon sweep and possible sphincterotomy. Final images demonstrate placement of a pancreatic stent and common bile duct stent. There was no definite extraluminal contrast visualized during the study. IMPRESSION: ERCP as above. These images were submitted for radiologic interpretation only. Please see the procedural report for the amount of contrast and the fluoroscopy time utilized. Electronically Signed   By: Katherine Mantle M.D.   On: 03/11/2020 14:43   ECHOCARDIOGRAM COMPLETE  Result Date: 03/23/2020    ECHOCARDIOGRAM REPORT   Patient Name:   Jorge Evans. Date of Exam: 03/23/2020 Medical Rec #:  [id number]           Height:       66.0 in Accession #:    [id number]          Weight:       154.0 lb Date of Birth:  1950-12-06           BSA:          1.790 m Patient Age:    69 years            BP:           121/65 mmHg Patient Gender: M                   HR:           88 bpm. Exam Location:  Inpatient Procedure: 2D Echo and Intracardiac Opacification Agent Indications:    atrial fibrillation  History:        Patient has no prior history of Echocardiogram examinations.                 Arrythmias:Atrial Fibrillation.  Sonographer:    Delcie Roch Referring Phys: [id number] JESSICA U VANN IMPRESSIONS  1. Left ventricular ejection fraction, by estimation, is 65 to 70%. The left ventricle has normal function. The left ventricle has no regional wall motion abnormalities. Left ventricular diastolic function could not be evaluated.  2. Right ventricular systolic function is normal. The right ventricular size is normal. There is normal pulmonary artery systolic pressure. The estimated right ventricular systolic pressure is 23.4 mmHg.  3. The mitral valve is grossly normal. Trivial mitral valve regurgitation.  4. The aortic valve is calcified. Aortic valve regurgitation is  trivial. No aortic stenosis is present.  5. The inferior vena cava is normal in size with greater than 50% respiratory variability, suggesting right atrial pressure of 3 mmHg. Comparison(s): No prior Echocardiogram. FINDINGS  Left Ventricle: Left ventricular ejection fraction, by estimation, is 65 to 70%. The left ventricle has normal function. The left ventricle has no regional wall motion abnormalities. Definity contrast agent was given IV to delineate the left ventricular  endocardial borders. The left ventricular internal cavity size was normal in size. There is no left ventricular hypertrophy. Left ventricular diastolic function could not be evaluated due to atrial fibrillation. Left ventricular diastolic function could  not be evaluated. Right Ventricle: The right ventricular size is normal. No increase in right ventricular wall thickness. Right ventricular  systolic function is normal. There is normal pulmonary artery systolic pressure. The tricuspid regurgitant velocity is 2.26 m/s, and  with an assumed right atrial pressure of 3 mmHg, the estimated right ventricular systolic pressure is 23.4 mmHg. Left Atrium: Left atrial size was normal in size. Right Atrium: Right atrial size was normal in size. Pericardium: There is no evidence of pericardial effusion. Mitral Valve: The mitral valve is grossly normal. Trivial mitral valve regurgitation. Tricuspid Valve: The tricuspid valve is grossly normal. Tricuspid valve regurgitation is trivial. Aortic Valve: The aortic valve is calcified. Aortic valve regurgitation is trivial. No aortic stenosis is present. Pulmonic Valve: The pulmonic valve was normal in structure. Pulmonic valve regurgitation is not visualized. Aorta: The aortic root and ascending aorta are structurally normal, with no evidence of dilitation. Venous: The inferior vena cava is normal in size with greater than 50% respiratory variability, suggesting right atrial pressure of 3 mmHg. IAS/Shunts: No  atrial level shunt detected by color flow Doppler.  LEFT VENTRICLE PLAX 2D LVIDd:         4.20 cm LVIDs:         2.50 cm LV PW:         1.00 cm LV IVS:        1.10 cm LVOT diam:     2.00 cm LVOT Area:     3.14 cm  LEFT ATRIUM             Index       RIGHT ATRIUM           Index LA diam:        3.90 cm 2.18 cm/m  RA Area:     11.30 cm LA Vol (A2C):   29.1 ml 16.26 ml/m RA Volume:   20.20 ml  11.29 ml/m LA Vol (A4C):   40.4 ml 22.58 ml/m LA Biplane Vol: 37.7 ml 21.07 ml/m   AORTA Ao Root diam: 3.40 cm Ao Asc diam:  3.60 cm TRICUSPID VALVE TR Peak grad:   20.4 mmHg TR Vmax:        226.00 cm/s  SHUNTS Systemic Diam: 2.00 cm Zoila Shutter MD Electronically signed by Zoila Shutter MD Signature Date/Time: 03/23/2020/5:05:35 PM    Final     Labs:  CBC: Recent Labs    03/23/20 0232 03/22/2020 0528 03/25/20 0401 03/14/2020 0216  WBC 12.0* 15.2* 16.3* 18.7*  HGB 11.8* 11.4* 11.7* 10.8*  HCT 36.2* 34.3* 35.7* 32.6*  PLT 326 347 403* 421*    COAGS: No results for input(s): INR, APTT in the last 8760 hours.  BMP: Recent Labs    03/22/20 0224 03/23/20 0232 03/25/20 0401 03/25/2020 0216  NA 139 136 132* 133*  K 4.0 3.9 3.8 3.7  CL 108 105 99 101  CO2 21* 20* 20* 22  GLUCOSE 106* 99 135* 114*  BUN 29* 24* 21 16  CALCIUM 7.8* 7.7* 7.3* 7.2*  CREATININE 1.40* 0.93 1.02 0.93  GFRNONAA 54* >60 >60 >60    LIVER FUNCTION TESTS: Recent Labs    03/22/20 0224 03/23/20 0232 03/25/20 0401 03/18/2020 0216  BILITOT 1.6* 2.1* 3.2* 2.7*  AST 57* 78* 93* 121*  ALT 105* 90* 77* 97*  ALKPHOS 68 73 75 75  PROT 4.8* 5.0* 5.3* 4.7*  ALBUMIN 1.8* 1.7* 1.9* 1.6*    Assessment and Plan:  Biloma drain intact Will follow OP still significant Would consider re CT when OP is minimal (10 cc/24 hrs)  Electronically Signed: Robet Leu, PA-C 03/08/2020,  7:10 AM   I spent a total of 15 Minutes at the the patient's bedside AND on the patient's hospital floor or unit, greater than 50% of which was  counseling/coordinating care for biloma drain

## 2020-03-26 NOTE — Progress Notes (Signed)
Patient respiratory status declining and ABG values showed inadequate oxygenation. Anesthesia notified and came to bedside for intubation. Patient stable and will soon be transported to 4N ICU.   Jacobo Forest, RN

## 2020-03-26 NOTE — Progress Notes (Signed)
Pharmacy Antibiotic Note  Jorge Evans. is a 69 y.o. male admitted on 19-Mar-2020 with abdominal pain/vomiting.  Pharmacy has been consulted for vancomcyin dosing.  Increased abdominal pain overnight, CT shows increased free air no perforation but does show peritonitis now s/p exlap. Currently afebrile, wbc elevated at 18. Concern for sepsis orders to add vancomycin to the zosyn he is already receiving. Scr appear normal.   Vancomycin 1500 mg IV Q 24 hrs. Goal AUC 400-550. Expected AUC:  SCr used: 0.93  Plan: Vancomycin 1750mg  x1 then 1500mg  IV every 24 hours.  Goal trough 15-20 mcg/mL.  Height: 5' 5.98" (167.6 cm) Weight: 69.9 kg (154 lb) IBW/kg (Calculated) : 63.76  Temp (24hrs), Avg:98 F (36.7 C), Min:97 F (36.1 C), Max:98.5 F (36.9 C)  Recent Labs  Lab 03/21/20 1535 03/22/20 0224 03/23/20 0232 03/29/2020 0528 03/25/20 0401 03/29/2020 0216 03/17/2020 0752  WBC  --  11.3* 12.0* 15.2* 16.3* 18.7*  --   CREATININE 1.63* 1.40* 0.93  --  1.02 0.93  --   LATICACIDVEN  --   --   --   --   --   --  1.3    Estimated Creatinine Clearance: 66.7 mL/min (by C-G formula based on SCr of 0.93 mg/dL).    Allergies  Allergen Reactions  . Other Itching  . Demerol [Meperidine] Rash    Antimicrobials this admission: Zosyn 12/18>> Vanc 12/23>>   Microbiology results: Perihepatic biloma cx 12/18: multiple orgs  Thank you for allowing pharmacy to be a part of this patient's care.  03/28/20 PharmD., BCPS Clinical Pharmacist 04/03/2020 5:30 PM

## 2020-03-26 NOTE — Progress Notes (Signed)
Subjective: Acute events noted overnight.  Objective: Vital signs in last 24 hours: Temp:  [97.8 F (36.6 C)-98.5 F (36.9 C)] 98.5 F (36.9 C) (12/23 1140) Pulse Rate:  [74-155] 133 (12/23 1140) Resp:  [17-21] 18 (12/23 0800) BP: (105-134)/(57-96) 105/59 (12/23 1140) SpO2:  [90 %-94 %] 90 % (12/23 1140) Last BM Date: 03/25/20  Intake/Output from previous day: 12/22 0701 - 12/23 0700 In: 2778.7 [P.O.:740; I.V.:1870.2; IV Piggyback:168.5] Out: 1085 [Urine:925; Drains:160] Intake/Output this shift: Total I/O In: 3.8 [I.V.:3.8] Out: 100 [Drains:100]  General appearance: alert and moderate distress Resp: clear to auscultation bilaterally Cardio: tachycardic, irregular GI: distended, tympanic, no rebound or rigidity Extremities: extremities normal, atraumatic, no cyanosis or edema  Lab Results: Recent Labs    03/06/2020 0528 03/25/20 0401 03/22/2020 0216  WBC 15.2* 16.3* 18.7*  HGB 11.4* 11.7* 10.8*  HCT 34.3* 35.7* 32.6*  PLT 347 403* 421*   BMET Recent Labs    03/25/20 0401 03/18/2020 0216  NA 132* 133*  K 3.8 3.7  CL 99 101  CO2 20* 22  GLUCOSE 135* 114*  BUN 21 16  CREATININE 1.02 0.93  CALCIUM 7.3* 7.2*   LFT Recent Labs    03/09/2020 0216  PROT 4.7*  ALBUMIN 1.6*  AST 121*  ALT 97*  ALKPHOS 75  BILITOT 2.7*   PT/INR No results for input(s): LABPROT, INR in the last 72 hours. Hepatitis Panel No results for input(s): HEPBSAG, HCVAB, HEPAIGM, HEPBIGM in the last 72 hours. C-Diff No results for input(s): CDIFFTOX in the last 72 hours. Fecal Lactopherrin No results for input(s): FECLLACTOFRN in the last 72 hours.  Studies/Results: CT ABDOMEN PELVIS WO CONTRAST  Result Date: 03/25/2020 CLINICAL DATA:  Postoperative abdominal pain. EXAM: CT ABDOMEN AND PELVIS WITHOUT CONTRAST TECHNIQUE: Multidetector CT imaging of the abdomen and pelvis was performed following the standard protocol without IV contrast. COMPARISON:  None. FINDINGS: Lower chest: Mild  areas of atelectasis and/or infiltrate are seen within the bilateral lung bases, right greater than left. There is a small right pleural effusion. Hepatobiliary: No focal liver abnormality is seen. A moderate amount of pneumobilia is noted. Status post cholecystectomy. A distal common bile duct stent is in place. Pancreas: A distal pancreatic duct stent is seen. This represents a new finding when compared to the prior study. The remaining visualized portions of the pancreas are unremarkable Spleen: Normal in size without focal abnormality. Adrenals/Urinary Tract: Adrenal glands are unremarkable. Kidneys are normal, without renal calculi, focal lesion, or hydronephrosis. Bladder is unremarkable. Stomach/Bowel: There is a small hiatal hernia. Multiple surgical clips are seen along the gastroesophageal junction. Appendix appears normal. Multiple dilated small bowel loops are seen within the abdomen and pelvis. A gradual transition zone is seen within the mid to lower right abdomen. A moderate amount of intra-abdominal the free air is seen within the anterior aspect of the abdomen. This is increased in severity when compared to the prior study. Vascular/Lymphatic: Aortic atherosclerosis. No enlarged abdominal or pelvic lymph nodes. Reproductive: The prostate gland is mildly enlarged. Other: Moderate severity anasarca is seen along the lateral aspects of the abdominal and pelvic walls. A surgical drain is seen the entering via the anterolateral aspect of the abdomen on the right. Its distal end is seen adjacent to the lateral aspect of the right lobe of the liver. A trace amount of residual perihepatic fluid is seen within this region. A small amount of posterior pelvic free fluid is seen. Musculoskeletal: Degenerative changes seen throughout the lumbar spine.  IMPRESSION: 1. Moderate amount of intra-abdominal free air, increased in severity when compared to the prior study. While this is likely secondary to the patient's  recent surgical intervention, the presence of a perforated hollow viscus cannot completely be excluded. 2. Additional findings consistent with a postoperative ileus. 3. Right-sided surgical drain positioning, as described above, with near-total resolution of the perihepatic fluid seen on the prior study. 4. Interval distal pancreatic duct stent placement since the prior exam. 5. Moderate severity anasarca. 6. Small hiatal hernia. 7. Aortic atherosclerosis. Aortic Atherosclerosis (ICD10-I70.0). Electronically Signed   By: Aram Candela M.D.   On: 03/25/2020 23:07   [place] EKG SITE RITE  Result Date: 03/17/2020 If Site Rite image not attached, placement could not be confirmed due to current cardiac rhythm.   Medications:  Scheduled: . [MAR Hold] acetaminophen  1,000 mg Oral Q8H  . [MAR Hold] digoxin  0.25 mg Intravenous Once   Followed by  . [MAR Hold] digoxin  0.25 mg Intravenous Daily  . [MAR Hold] insulin aspart  0-6 Units Subcutaneous Q6H  . [MAR Hold] lidocaine  1 patch Transdermal Q24H  . [MAR Hold] lidocaine  1 application Urethral Once  . [MAR Hold] metoprolol tartrate  25 mg Oral BID  . [MAR Hold] pantoprazole (PROTONIX) IV  40 mg Intravenous Q12H  . [MAR Hold] senna-docusate  2 tablet Oral BID  . [MAR Hold] sucralfate  1 g Oral TID WC & HS   Continuous: . diltiazem (CARDIZEM) infusion 10 mg/hr (03/08/2020 1000)  . [MAR Hold] famotidine (PEPCID) IV    . heparin Stopped (03/15/2020 1052)  . lactated ringers 100 mL/hr at 03/22/2020 0333  . lactated ringers    . lactated ringers 10 mL/hr at 03/25/2020 1435  . [MAR Hold] piperacillin-tazobactam (ZOSYN)  IV 3.375 g (03/10/2020 0545)  . TPN ADULT (ION)      Assessment/Plan: 1) Free air in the abdomen. 2) Afib with RVR. 3) Mid abdominal pain. 4) Malnutrition.   The results of the CT scan overnight were reviewed.  The patient is not toxic, but he is uncomfortable.  His blood pressure is elevated and he is in afib with RVR.  It is not  clear if the patient has a perforation or if this from his ileus.  He is very uncomfortable, but the pain medication is helping.  His WBC increased to 18K.  His arfib with RVR is secondary to his current GI issues.  There is some increase in the external biliary drain.  I spoke with the wife and the patient.  Surgery will be called to reevaluate the patient.  He will also need to be started on TPN.  Plan: 1) Await surgical input. 2) Pharmacy consultation for TPN.  LOS: 13 days   Tabitha Tupper D 03/11/2020, 2:41 PM

## 2020-03-26 NOTE — Op Note (Signed)
Preop diagnosis: Postoperative bile leak resulting in peritonitis Postop diagnosis: Bile peritonitis Procedure performed: Diagnostic laparoscopy with placement of intra-abdominal drain Surgeon:Nieshia Larmon K Lavon Bothwell Anesthesia: General Indications: This is a 69 year old male who is status post remote Billroth II gastrojejunostomy for peptic ulcer disease.  The surgery was performed many years ago.  Recently he presented with choledocholithiasis and symptomatic cholelithiasis.  He underwent ERCP on 12/10.  A stent was placed but the sphincterotomy was unable to be performed due to his anatomy.  On 03/31/2020 he underwent laparoscopic cholecystectomy.  Subsequently he developed a postoperative bile leak, likely due to back pressure from retained common bile duct stones.  He underwent drain placement on 12/18 by interventional radiology yielding a large amount of bilious fluid.  On 12/21 he underwent successful ERCP, sphincterotomy and stent placement.  His abdomen seems to be more distended.  CT scan showed possibly more free air in his abdomen than would be expected after cholecystectomy 7 days ago.  His white count is also increased.  Due to his findings, we recommended diagnostic laparoscopy to look for sign of bowel perforation or ongoing bile leak.  Operative findings: The patient has dense intra-abdominal adhesions to the left of his falciform ligament from his previous surgery.  These were not taken down at the previous surgery and we did not take these down today.  He has an obvious small bowel ileus with dilated small bowel throughout the abdomen.  He has signs of a large area of bile staining over the liver.  The percutaneous drain is in the center of this area.  There is minimal residual bile.  In the gallbladder fossa, there seems to be a collection of bile that is undrained.  The second portion of the duodenum is visualized and appears to be soft with no sign of inflammation.  There is also some minimal  free fluid within the pelvis  Description of procedure patient is brought to the operating room placed in the supine position on the operating room table.  He underwent induction of general anesthesia.  During induction, the patient vomited a large amount.  Some of the vomitus was witnessed to be aspirated.  He was quickly intubated and underwent nasotracheal suction by anesthesia.  His abdomen was prepped with ChloraPrep and Betadine and draped sterile fashion.  A timeout was taken to ensure the proper patient and proper procedure.  We open the incision at his umbilicus.  We dissected down to the fascia.  The stay suture was removed.  We entered the peritoneal cavity bluntly.  A pursestring suture of 0 Vicryl was placed.  The Hassan cannula was inserted and secured with a stay suture.  The laparoscope was inserted.  The small bowel is adherent to the anterior abdominal wall.  However with some gentle blunt dissection with the tip of the laparoscope we were able to take all of this bowel down in the right upper quadrant.  An additional 5 mm port was placed in the epigastrium at the previous port site.  Using the suction device we were able to clear the anterior abdominal wall in the right upper quadrant.  The percutaneous drain is seen over the dome of the liver.  There is evidence of a previous large bile collection here but there is minimal residual bile over the liver.  We are able to lift the edge of the liver and bluntly dissect into the gallbladder fossa.  There is evidence of previous hemostatic agent in this area as well as  some bile staining.  There is a collection of bile down around the cystic duct stump that seems to be undrained.  We irrigated and suctioned this out.  I can see the clips on the cystic duct.  We observe this area for some time and there is no sign of ongoing bile leakage.  We were able to identify the second portion of the duodenum.  This appeared to be's pink and soft with no sign of  thickening or inflammation.  There is no sign of perforation.  We then examined the lower abdomen.  There is some dilated loops of small bowel that are adherent to the anterior abdominal wall consistent with an ileus.  There is some mildly bilious fluid down in the pelvis that was suctioned out.  We then irrigated this thoroughly and observed.  There is no further accumulation of bilious drainage.  This may be run down from the bile collection in the right upper quadrant.  We then cleared the anterior abdominal wall completely.  There are no signs of other fascial defects.  The small bowel appears to be globally dilated.  We again inspected the gallbladder fossa.  There is no further accumulation of any bilious drainage.  A 19 French drain was brought in through a stab incision laterally in the right upper quadrant.  This was placed into the gallbladder fossa and then also tracked up along the right side of the liver.  This was secured with a 2-0 nylon suture.  This was placed to bulb suction.  We released our insufflation and the ports were removed.  The pursestring suture was used to close the umbilical fascia.  4-0 Monocryl was used to close all the skin incisions.  Dermabond was applied.  The patient was then extubated and brought to the recovery room in stable condition.  All sponge, instrument, and needle counts are correct.  Wilmon Arms. Corliss Skains, MD, [facility] Surgery  General/ Trauma Surgery   03/29/2020 4:26 PM

## 2020-03-26 NOTE — Transfer of Care (Signed)
Immediate Anesthesia Transfer of Care Note  Patient: Jorge Evans.  Procedure(s) Performed: LAPAROSCOPY DIAGNOSTIC (N/A Abdomen)  Patient Location: PACU  Anesthesia Type:General  Level of Consciousness: drowsy  Airway & Oxygen Therapy: Patient Spontanous Breathing and Patient connected to face mask oxygen. Waiting on RT to place pt on bipap.  Post-op Assessment: Report given to RN and Post -op Vital signs reviewed and stable  Post vital signs: Reviewed  Last Vitals:  Vitals Value Taken Time  BP 113/45 03/29/2020 1701  Temp 36.1 C 03/20/2020 1645  Pulse 83 04/01/2020 1706  Resp 14 03/29/2020 1706  SpO2 95 % 03/10/2020 1706  Vitals shown include unvalidated device data.  Last Pain:  Vitals:   03/18/2020 1140  TempSrc: Oral  PainSc:       Patients Stated Pain Goal: 0 (03/25/20 1943)  Complications:  Encounter Complications  Complication Outcome Phase Comment  Aspiration  Intraprocedure pt vomited large volume brown/red fluid immediately after induction, with evidence of aspiration (brown fluid suctioned from ETT after intubation, brown fluid on ETT after extubation)

## 2020-03-26 NOTE — Progress Notes (Addendum)
Progress Note  2 Days Post-Op  Subjective:  Uncomfortable. Wife at bedside. Patient requiring morphine to control his pain and when it wears off he is in severe abdominal pain states right side of his abdomen hurts worse than left. Reports non-bloody diarrhea this AM but is also having bilious emesis with small amt bright red blood.   Objective: Vital signs in last 24 hours: Temp:  [97.7 F (36.5 C)-98.5 F (36.9 C)] 98.5 F (36.9 C) (12/23 0800) Pulse Rate:  [73-155] 133 (12/23 1014) Resp:  [17-28] 18 (12/23 0800) BP: (107-134)/(57-96) 131/96 (12/23 1014) SpO2:  [93 %-95 %] 94 % (12/23 0800) Last BM Date: 03/25/20  Intake/Output from previous day: 12/22 0701 - 12/23 0700 In: 2778.7 [P.O.:740; I.V.:1870.2; IV Piggyback:168.5] Out: 1085 [Urine:925; Drains:160] Intake/Output this shift: Total I/O In: 3.8 [I.V.:3.8] Out: -   PE: General:white male laying in bed, very still, appears uncomfortable Heart:irregularly irregular in the 130's Lungs: CTAB, no wheezes, rhonchi, or rales noted. Respiratory effort nonlabored Abd: distended, mild TTP without peritonitis on my exam,RUQ blake drain with bilious fluid. Abdominal incisions c/d/i without drainage or cellulitis, No BS.   Lab Results:  Recent Labs    03/25/20 0401 03/21/2020 0216  WBC 16.3* 18.7*  HGB 11.7* 10.8*  HCT 35.7* 32.6*  PLT 403* 421*   BMET Recent Labs    03/25/20 0401 04/03/2020 0216  NA 132* 133*  K 3.8 3.7  CL 99 101  CO2 20* 22  GLUCOSE 135* 114*  BUN 21 16  CREATININE 1.02 0.93  CALCIUM 7.3* 7.2*   PT/INR No results for input(s): LABPROT, INR in the last 72 hours. CMP     Component Value Date/Time   NA 133 (L) 04/02/2020 0216   K 3.7 03/22/2020 0216   CL 101 03/19/2020 0216   CO2 22 03/10/2020 0216   GLUCOSE 114 (H) 04/02/2020 0216   BUN 16 03/08/2020 0216   CREATININE 0.93 03/20/2020 0216   CREATININE 0.93 11/19/2019 0842   CALCIUM 7.2 (L) 03/12/2020 0216   PROT 4.7 (L) 03/28/2020  0216   ALBUMIN 1.6 (L) 04/02/2020 0216   AST 121 (H) 03/09/2020 0216   ALT 97 (H) 03/09/2020 0216   ALKPHOS 75 03/30/2020 0216   BILITOT 2.7 (H) 03/06/2020 0216   GFRNONAA >60 03/28/2020 0216   GFRAA >60 11/11/2016 1600   Lipase     Component Value Date/Time   LIPASE 20 03/05/2020 1452       Studies/Results: CT ABDOMEN PELVIS WO CONTRAST  Result Date: 03/25/2020 CLINICAL DATA:  Postoperative abdominal pain. EXAM: CT ABDOMEN AND PELVIS WITHOUT CONTRAST TECHNIQUE: Multidetector CT imaging of the abdomen and pelvis was performed following the standard protocol without IV contrast. COMPARISON:  None. FINDINGS: Lower chest: Mild areas of atelectasis and/or infiltrate are seen within the bilateral lung bases, right greater than left. There is a small right pleural effusion. Hepatobiliary: No focal liver abnormality is seen. A moderate amount of pneumobilia is noted. Status post cholecystectomy. A distal common bile duct stent is in place. Pancreas: A distal pancreatic duct stent is seen. This represents a new finding when compared to the prior study. The remaining visualized portions of the pancreas are unremarkable Spleen: Normal in size without focal abnormality. Adrenals/Urinary Tract: Adrenal glands are unremarkable. Kidneys are normal, without renal calculi, focal lesion, or hydronephrosis. Bladder is unremarkable. Stomach/Bowel: There is a small hiatal hernia. Multiple surgical clips are seen along the gastroesophageal junction. Appendix appears normal. Multiple dilated small bowel loops are  seen within the abdomen and pelvis. A gradual transition zone is seen within the mid to lower right abdomen. A moderate amount of intra-abdominal the free air is seen within the anterior aspect of the abdomen. This is increased in severity when compared to the prior study. Vascular/Lymphatic: Aortic atherosclerosis. No enlarged abdominal or pelvic lymph nodes. Reproductive: The prostate gland is mildly  enlarged. Other: Moderate severity anasarca is seen along the lateral aspects of the abdominal and pelvic walls. A surgical drain is seen the entering via the anterolateral aspect of the abdomen on the right. Its distal end is seen adjacent to the lateral aspect of the right lobe of the liver. A trace amount of residual perihepatic fluid is seen within this region. A small amount of posterior pelvic free fluid is seen. Musculoskeletal: Degenerative changes seen throughout the lumbar spine. IMPRESSION: 1. Moderate amount of intra-abdominal free air, increased in severity when compared to the prior study. While this is likely secondary to the patient's recent surgical intervention, the presence of a perforated hollow viscus cannot completely be excluded. 2. Additional findings consistent with a postoperative ileus. 3. Right-sided surgical drain positioning, as described above, with near-total resolution of the perihepatic fluid seen on the prior study. 4. Interval distal pancreatic duct stent placement since the prior exam. 5. Moderate severity anasarca. 6. Small hiatal hernia. 7. Aortic atherosclerosis. Aortic Atherosclerosis (ICD10-I70.0). Electronically Signed   By: Aram Candela M.D.   On: 03/25/2020 23:07   DG ERCP BILIARY & PANCREATIC DUCTS  Result Date: 03/29/2020 CLINICAL DATA:  Common bile duct obstruction EXAM: ERCP TECHNIQUE: Multiple spot images obtained with the fluoroscopic device and submitted for interpretation post-procedure. FLUOROSCOPY TIME:  Fluoroscopy Time:  13 minutes and 28 seconds Radiation Exposure Index (if provided by the fluoroscopic device): 189.2 mGy Number of Acquired Spot Images: 20 COMPARISON:  CT dated 03/20/2020 FINDINGS: Initial images demonstrate a plastic biliary stent in place. Subsequent images demonstrate removal of the plastic biliary stent and cannulation of the common bile duct with injection of contrast. There are multiple filling defects within the common bile  duct. This appears to have been followed by a balloon sweep and possible sphincterotomy. Final images demonstrate placement of a pancreatic stent and common bile duct stent. There was no definite extraluminal contrast visualized during the study. IMPRESSION: ERCP as above. These images were submitted for radiologic interpretation only. Please see the procedural report for the amount of contrast and the fluoroscopy time utilized. Electronically Signed   By: Katherine Mantle M.D.   On: 04/02/2020 14:43   [place] EKG SITE RITE  Result Date: 04/03/2020 If Site Rite image not attached, placement could not be confirmed due to current cardiac rhythm.   Anti-infectives: Anti-infectives (From admission, onward)   Start     Dose/Rate Route Frequency Ordered Stop   03/21/20 0200  piperacillin-tazobactam (ZOSYN) IVPB 3.375 g        3.375 g 12.5 mL/hr over 240 Minutes Intravenous Every 8 hours 03/21/20 0007     03/18/2020 0600  ceFAZolin (ANCEF) IVPB 2g/100 mL premix        2 g 200 mL/hr over 30 Minutes Intravenous On call to O.R. 03/18/20 1334 03/20/20 0559   03/25/2020 0600  piperacillin-tazobactam (ZOSYN) IVPB 3.375 g  Status:  Discontinued        3.375 g 12.5 mL/hr over 240 Minutes Intravenous Every 8 hours 03/06/2020 0046 03/15/20 1109   03/25/2020 2330  piperacillin-tazobactam (ZOSYN) IVPB 3.375 g  3.375 g 100 mL/hr over 30 Minutes Intravenous  Once 03/23/2020 2317 Apr 01, 2020 0131       Assessment/Plan GERD IDA PMH Bilroth II for bleeding gastric ulcers ABL anemia - Hgb11.7, stable A. Fib with RVR - on heparin and diltiazem gtts  Symptomatic cholelithiasis Choledocholithiasis without evidence of acute cholecystitis  S/p ERCP, placement of CBD stent 12/10 [name] -unable to perform a sphincterotomy and stone extraction due to anatomy s/p Billroth II S/plaparoscopic cholecystectomy12/16 Dr. Janee Morn Postoperative bile leak  - POD#7  - CT scan 12/17 revealed moderate volumeperihepatic  free fluid containing foci of air with non organized fluid and air in the gallbladder fossaconcerning for biloma  - s/p IR drain placement 12/18 with yield high volume bilious fluid, culture pending  - s/p ERCP with sphincterotomy/sphincteroplasty, stent placement and lithotripsy 12/21 by Dr. Meridee Score  - Tbili up some today 2.7 from 3.2  - drain output remains bilious and 160 cc in last 24h (downtrending)  - patient with increase in pain overnight, WBC 18.7 from 16.3. CT scan of the abdomen performed overnight with post-procedural vs new pneumoperitoneum. Internal improvement in perihepatic fluid collection. He continues to have significant pain, worsening abdominal distention, and is now in a.fib with RVR back on dilt gtt. He is also having small amts blood-tinged, bilious emesis. At this time I think he needs diagnostic laparoscopy, possible exploratory laparotomy due to pneumoperitoneum unknown etiology. Hold hep gtt. Will touch base with cardiology and primary team.  ID: [id number] 12/18>> FEN:NPO, IVF, agree with TPN VTE: SCDs,heparin gtt  Foley:none  LOS: 13 days    Adam Phenix , [facility] 03/27/2020, 10:45 AM Please see Amion for pager number during day hours 7:00am-4:30pm

## 2020-03-26 NOTE — Progress Notes (Signed)
Contacted [facility].Requested new orders for sedation. Patient on 1.2 of precedex. Morphine given for pain. Patient still breathing over vent and setting off vent alarms. Will continue to monitor.

## 2020-03-26 NOTE — Progress Notes (Addendum)
eLink Physician-Brief Progress Note Patient Name: Jorge Evans. DOB: 08/20/1950 MRN: [id number]   Date of Service  03/07/2020  HPI/Events of Note  Patient with sub-optimal analgo-sedation on the ventilator with Precedex + PRN  Morphine.  eICU Interventions  PRN Morphine discontinued and Fentanyl substituted.        Migdalia Dk 03/31/2020, 10:43 PM

## 2020-03-26 NOTE — Consult Note (Signed)
NAME:  Jorge PiRaymond N Steckman Jr., MRN:  [id number], DOB:  1950/08/12, LOS: 13 ADMISSION DATE:  03/15/2020, CONSULTATION DATE:  03/27/2020 REFERRING MD:  Dr. Garald BraverKennerly, CHIEF COMPLAINT:  Respiratory failure    Brief History:  69 year old male presented originally 12/9 with complaints of abdominal pain, nausea, and vomiting, imaging concerning for choledocholithiasis underwent ERCP with stent placement later followed with cholecystectomy.  Overnight 12/23 patient developed worsening abdominal pain patient was taken for diagnostic laparoscopy with placement of intra-abdominal drain.  She remained intubated postoperatively due to respiratory compromise including questionable aspiration  History of Present Illness:  Jorge Evans is a 69 year old male with a past medical history significant for GERD and anemia presented 12/9 with complaints of worsening abdominal pain, nausea, and vomiting with concerns for choledocholithiasis.  On admission patient was seen with mild LFT abnormalities and elevated white blood cell count but did not appear acutely ill.  Therefore GI and tried hospital service is consulted for further management and admission.  Patient underwent ERCP with biliary stent placement per GI 12/10, P 3 large common bile duct stones noted,unable to do sphincterotomy and stone extraction secondary to his anatomy status post Billroth II.  Initially post ERCP patient showed slight improvement but later developed leukocytosis and low-grade fever concerning for acute cholecystitis.  Clinical team including general surgery and GI agreed patient would benefit from laparoscopic cholecystectomy.    Patient underwent laparoscopic cholecystectomy 12/16.  Postoperatively patient developed moderate volume perihepatic free fluid in nonorganized fluid and air in the gallbladder fossa concerning for biloma for which patient had IR drain placed 12/18.  On 12/21 he underwent successful ERCP, sphincterotomy and stent  placement.   Overnight of 12/23 patient had worsening ABD pain and was sent for repeat ABD CT that revealed  showed possibly more free air in his abdomen than would be expected after cholecystectomy 7 days ago. His white count is also increased. Due to his findings, we recommended diagnostic laparoscopy to look for sign of bowel perforation or ongoing bile leak.   Preoperatively on induction for anesthesia patient was seen with aspiration event.  He tolerated the procedure well but was seen with mild respiratory distress post extubation in PACU and was placed on BIPAP. PCCM consulted for further management.    Past Medical History   Past Medical History:  Diagnosis Date  . Anemia   . GERD (gastroesophageal reflux disease)     Significant Hospital Events   -ercp with cbd stent 12/10  -Cholecystectomy 12/16  -IR drain for bile leak 12/18  -ercp and stent placement 12/21 -diagnostic lap 12/23 and intra-abdominal drain  Consults:  GI Surgery [facility]  Procedures:    Significant Diagnostic Tests:  12/9 ct abd/pelvis: 1. Interval development of moderate intra and extrahepatic biliary dilatation with multiple rounded filling defects within the mid to distal common duct, suspicious for choledocholithiasis. Suggest correlation with LFTs, further evaluation with MRCP may be obtained. 2. Streaky hyperdense foci within the bladder, suspect that this is due to excreted contrast as opposed to bladder mass. 3. Status post partial gastrectomy with no evidence for an obstruction. 4. Aortic atherosclerosis.  12/17 ct abd/pelvis: 1. Interval cholecystectomy. Moderate volume of perihepatic free fluid containing foci of air with non organized fluid and air in the gallbladder fossa. Given the amount of fluid, biloma is favored over postoperative seroma. Sterility is indeterminate by imaging. Consider nuclear medicine hepatic biliary scan to assess for bile leak. 2.  Plastic common bile duct stent in  place. Suspected residual 9 mm stone at the proximal aspect of the stent. Common bile duct dilatation of 16 mm, with improved but persistent intrahepatic biliary dilatation on the left. Pneumobilia related to recent procedure. 3. Small right and trace left pleural effusions. 4. Absent renal excretion on delayed phase imaging, can be seen with renal dysfunction. 5. Mild distal colonic diverticulosis without diverticulitis. 12/20 echo: LVEF 65-70% RV normal 12/22 ct abdomen pelvis: 1. Moderate amount of intra-abdominal free air, increased in severity when compared to the prior study. While this is likely secondary to the patient's recent surgical intervention, the presence of a perforated hollow viscus cannot completely be excluded. 2. Additional findings consistent with a postoperative ileus. 3. Right-sided surgical drain positioning, as described above, with near-total resolution of the perihepatic fluid seen on the prior study. 4. Interval distal pancreatic duct stent placement since the prior exam. 5. Moderate severity anasarca. 6. Small hiatal hernia. 7. Aortic atherosclerosis.  Micro Data:  12/10 sars2: neg 12/18 perihepatic biloma: abundant gram negative and gram pos cocci  Antimicrobials:  Zosyn 12/17->  Interim history/subjective:  12/23: increased abdominal pain, wbc and septic appearance prompted ct abd with free air noted. Taken for ex lap.   Objective   Blood pressure (!) 105/59, pulse (!) 133, temperature (!) 97 F (36.1 C), resp. rate 18, height 5' 5.98" (1.676 m), weight 69.9 kg, SpO2 90 %.        Intake/Output Summary (Last 24 hours) at 03/05/2020 1654 Last data filed at 03/25/2020 1616 Gross per 24 hour  Intake 1881.76 ml  Output 1505 ml  Net 376.76 ml   Filed Weights   03/25/2020 1442 03/06/2020 1441  Weight: 69.9 kg 69.9 kg    Examination: General: ill appearing on NIV with ng in place. Arouses with verbal  stim HEENT: NCAT, EOMI, PERRLA, NIV in place Lungs: + rhonchi bilaterally Cardiovascular: irreg irreg no m/g/r Abdomen: post surgical laparoscopic incisions approximated and clean appearing. Drains in place distended TTP, BS decreased  Extremities: + edema ble Skin: no rashes, warm and dry Neuro: moves all 4 extremities [id number]  Resolved Hospital Problem list     Assessment & Plan:  Acute choledocholithiasis:  -S/p ercp 12/10 and 12/21 -Cholecystectomy 12/16 and bile leak 12/18 with subsequent stenting on 12/21 Acute periotonitis:  -2/2 bile leak -s/p diagnostic laparoscopy with surgery -wound care per surgery -npo at this time, await ok from surgery Sepsis 2/2 above:  -cont broad abx for gn and anerobes  Post operative resp insuff:  -on NIV as aspirated prior to intubation -extubated post operatively -high risk for need for repeat but now that decompressed with NG perhaps will diminish possibility.  -will need good pulm hygiene Preoperative aspiration event:  -empiric abx ongoing already for intra-abdominal process which should adequately cover  -if req intubation would perhaps bronch for specimen pending cxr -cxr pending.   afib with rvr:  -on dilt -cont this to maintain HR < 110 -cardiology following.   Best practice:  Diet: npo Pain/Anxiety/Delirium protocol (if indicated): per protocol VAP protocol (if indicated): per protocol DVT prophylaxis: scd GI prophylaxis: ppi Glucose control: ssi Mobility: bedrest Code Status: FULL Family Communication: per surgery Disposition: ICU  Labs   CBC: Recent Labs  Lab 03/22/20 0224 03/23/20 0232 03/20/2020 0528 03/25/20 0401 03/29/2020 0216  WBC 11.3* 12.0* 15.2* 16.3* 18.7*  HGB 11.1* 11.8* 11.4* 11.7* 10.8*  HCT 32.2* 36.2* 34.3* 35.7* 32.6*  MCV 86.8 87.7 86.0 87.5 86.9  PLT 235 326 347 403*  421*    Basic Metabolic Panel: Recent Labs  Lab 03/21/20 1535 03/22/20 0224 03/23/20 0232 03/25/20 0401  03/18/2020 0216  NA 139 139 136 132* 133*  K 4.1 4.0 3.9 3.8 3.7  CL 108 108 105 99 101  CO2 18* 21* 20* 20* 22  GLUCOSE 131* 106* 99 135* 114*  BUN 26* 29* 24* 21 16  CREATININE 1.63* 1.40* 0.93 1.02 0.93  CALCIUM 7.6* 7.8* 7.7* 7.3* 7.2*  MG  --  1.9  --   --   --    GFR: Estimated Creatinine Clearance: 66.7 mL/min (by C-G formula based on SCr of 0.93 mg/dL). Recent Labs  Lab 03/23/20 0232 03/21/2020 0528 03/25/20 0401 03/27/2020 0216 03/16/2020 0752  WBC 12.0* 15.2* 16.3* 18.7*  --   LATICACIDVEN  --   --   --   --  1.3    Liver Function Tests: Recent Labs  Lab 03/21/20 0455 03/22/20 0224 03/23/20 0232 03/25/20 0401 03/16/2020 0216  AST 104* 57* 78* 93* 121*  ALT 185* 105* 90* 77* 97*  ALKPHOS 89 68 73 75 75  BILITOT 1.9* 1.6* 2.1* 3.2* 2.7*  PROT 5.5* 4.8* 5.0* 5.3* 4.7*  ALBUMIN 2.2* 1.8* 1.7* 1.9* 1.6*   No results for input(s): LIPASE, AMYLASE in the last 168 hours. No results for input(s): AMMONIA in the last 168 hours.  ABG    Component Value Date/Time   TCO2 24 07/17/2015 1951     Coagulation Profile: No results for input(s): INR, PROTIME in the last 168 hours.  Cardiac Enzymes: No results for input(s): CKTOTAL, CKMB, CKMBINDEX, TROPONINI in the last 168 hours.  HbA1C: Hgb A1c MFr Bld  Date/Time Value Ref Range Status  08/23/2019 08:28 AM 4.9 <5.7 % of total Hgb Final    Comment:    For the purpose of screening for the presence of diabetes: . <5.7%       Consistent with the absence of diabetes 5.7-6.4%    Consistent with increased risk for diabetes             (prediabetes) > or =6.5%  Consistent with diabetes . This assay result is consistent with a decreased risk of diabetes. . Currently, no consensus exists regarding use of hemoglobin A1c for diagnosis of diabetes in children. . According to American Diabetes Association (ADA) guidelines, hemoglobin A1c <7.0% represents optimal control in non-pregnant diabetic patients. Different metrics  may apply to specific patient populations.  Standards of Medical Care in Diabetes(ADA). Marland Kitchen   12/08/2015 09:05 AM 5.3 <5.7 % Final    Comment:      For the purpose of screening for the presence of diabetes:   <5.7%       Consistent with the absence of diabetes 5.7-6.4 %   Consistent with increased risk for diabetes (prediabetes) >=6.5 %     Consistent with diabetes   This assay result is consistent with a decreased risk of diabetes.   Currently, no consensus exists regarding use of hemoglobin A1c for diagnosis of diabetes in children.   According to American Diabetes Association (ADA) guidelines, hemoglobin A1c <7.0% represents optimal control in non-pregnant diabetic patients. Different metrics may apply to specific patient populations. Standards of Medical Care in Diabetes (ADA).       CBG: Recent Labs  Lab 03/11/2020 1222  GLUCAP 97    Review of Systems:   Unobtainable 2/2 pt intubation  Past Medical History  He,  has a past medical history of Anemia and GERD (gastroesophageal reflux  disease).   Surgical History    Past Surgical History:  Procedure Laterality Date  . BILIARY DILATION  03/14/2020   Procedure: BILIARY DILATION;  Surgeon: Meridee Score Netty Starring., MD;  Location: [facility] ENDOSCOPY;  Service: Gastroenterology;;  . BILIARY STENT PLACEMENT  03/06/2020   Procedure: BILIARY STENT PLACEMENT;  Surgeon: Jeani Hawking, MD;  Location: [facility] ENDOSCOPY;  Service: Endoscopy;;  . BILIARY STENT PLACEMENT  03/11/2020   Procedure: BILIARY STENT PLACEMENT;  Surgeon: Lemar Lofty., MD;  Location: [facility] ENDOSCOPY;  Service: Gastroenterology;;  . BIOPSY  03/16/2020   Procedure: BIOPSY;  Surgeon: Lemar Lofty., MD;  Location: [facility] ENDOSCOPY;  Service: Gastroenterology;;  . CHOLECYSTECTOMY N/A 04/15/2020   Procedure: LAPAROSCOPIC CHOLECYSTECTOMY;  Surgeon: Violeta Gelinas, MD;  Location: [facility] OR;  Service: General;  Laterality: N/A;  . ERCP N/A 03/08/2020   Procedure:  ENDOSCOPIC RETROGRADE CHOLANGIOPANCREATOGRAPHY (ERCP);  Surgeon: Jeani Hawking, MD;  Location: [facility] ENDOSCOPY;  Service: Endoscopy;  Laterality: N/A;  . ERCP N/A 03/18/2020   Procedure: ENDOSCOPIC RETROGRADE CHOLANGIOPANCREATOGRAPHY (ERCP);  Surgeon: Lemar Lofty., MD;  Location: [facility] ENDOSCOPY;  Service: Gastroenterology;  Laterality: N/A;  . LITHOTRIPSY  04/01/2020   Procedure: LITHOTRIPSY;  Surgeon: Meridee Score Netty Starring., MD;  Location: [facility] ENDOSCOPY;  Service: Gastroenterology;;  . LUMBAR FUSION    . PANCREATIC STENT PLACEMENT  03/10/2020   Procedure: PANCREATIC STENT PLACEMENT;  Surgeon: Meridee Score Netty Starring., MD;  Location: [facility] ENDOSCOPY;  Service: Gastroenterology;;  . REMOVAL OF STONES  03/14/2020   Procedure: REMOVAL OF STONES;  Surgeon: Lemar Lofty., MD;  Location: [facility] ENDOSCOPY;  Service: Gastroenterology;;  . Dennison Mascot  03/21/2020   Procedure: Dennison Mascot;  Surgeon: Lemar Lofty., MD;  Location: [facility] ENDOSCOPY;  Service: Gastroenterology;;  . Francine Graven REMOVAL  03/22/2020   Procedure: STENT REMOVAL;  Surgeon: Lemar Lofty., MD;  Location: [facility] ENDOSCOPY;  Service: Gastroenterology;;  . STOMACH SURGERY    . SUBMUCOSAL TATTOO INJECTION  03/16/2020   Procedure: SUBMUCOSAL TATTOO INJECTION;  Surgeon: Meridee Score Netty Starring., MD;  Location: [facility] ENDOSCOPY;  Service: Gastroenterology;;     Social History   reports that he quit smoking about 21 years ago. He has never used smokeless tobacco. He reports that he does not drink alcohol and does not use drugs.   Family History   His family history includes Cancer in his father; Emphysema in his father; Heart attack in his mother.   Allergies Allergies  Allergen Reactions  . Other Itching  . Demerol [Meperidine] Rash     Home Medications  Prior to Admission medications   Medication Sig Start Date End Date Taking? Authorizing Provider  Cholecalciferol (VITAMIN D) 50 MCG (2000 UT) CAPS Take 1  capsule (2,000 Units total) by mouth daily. 01/21/20  Yes [place], Velna Hatchet, MD  ferrous sulfate 325 (65 FE) MG tablet Take 1 tablet (325 mg total) by mouth 2 (two) times daily with a meal. Patient taking differently: Take 325 mg by mouth daily with breakfast. 08/28/19  Yes [place], Velna Hatchet, MD  ondansetron (ZOFRAN) 4 MG tablet TAKE 1 TABLET BY MOUTH EVERY 8 HOURS AS NEEDED FOR NAUSEA AND VOMITING Patient taking differently: Take 4 mg by mouth every 8 (eight) hours as needed for nausea or vomiting. 01/21/20  Yes , Velna Hatchet, MD  sucralfate (CARAFATE) 1 g tablet TAKE 1 TABLET (1 G TOTAL) BY MOUTH 4 (FOUR) TIMES DAILY - WITH MEALS AND AT BEDTIME. 12/30/19  Yes , Velna Hatchet, MD     Critical care time: The patient  is critically ill with multiple organ systems failure and requires high complexity decision making for assessment and support, frequent evaluation and titration of therapies, application of advanced monitoring technologies and extensive interpretation of multiple databases.  Critical care time 35 mins. This represents my time independent of the NP's/PA's/med students/residents time taking care of the pt. This is excluding procedures.     Briant Sites DO [place] [facility] 2020-04-02, 4:54 PM

## 2020-03-26 NOTE — Progress Notes (Signed)
ANTICOAGULATION CONSULT NOTE  Pharmacy Consult:  Heparin Indication: atrial fibrillation  Allergies  Allergen Reactions  . Other Itching  . Demerol [Meperidine] Rash    Patient Measurements: Height: 5' 5.98" (167.6 cm) Weight: 69.9 kg (154 lb) IBW/kg (Calculated) : 63.76 Heparin Dosing Weight: 69 kg  Vital Signs: Temp: 97.9 F (36.6 C) (12/23 1900) Temp Source: Axillary (12/23 1900) BP: 155/63 (12/23 1900) Pulse Rate: 109 (12/23 1913)  Labs: Recent Labs    03/05/2020 0528 03/25/20 0401 03/25/20 1539 03/22/2020 0216 03/27/2020 1559 04/02/2020 1749  HGB 11.4* 11.7*  --  10.8* 10.2* 11.6*  HCT 34.3* 35.7*  --  32.6* 30.0* 34.0*  PLT 347 403*  --  421*  --   --   HEPARINUNFRC <0.10* <0.10* <0.10* 0.13*  --   --   CREATININE  --  1.02  --  0.93  --   --     Estimated Creatinine Clearance: 66.7 mL/min (by C-G formula based on SCr of 0.93 mg/dL).    Assessment: 14 yoM admitted with abdominal pain s/p ERCP 12/10, cholecystectomy 12/16, and drain placement 12/18.  Patient developed AFib and pharmacy consulted for heparin dosing.  Heparin level subtherapeutic (0.13) on gtt at 2100 units/hr. No issues with line or bleeding reported per RN.  Pt is s/p lap placement of intra-abdominal drain. D/w Dr. Corliss Skains, ok to resume heparin 4 hr post procedure without bolus.   Goal of Therapy:  Heparin level 0.3-0.7 units/ml Monitor platelets by anticoagulation protocol: Yes   Plan:  Resume heparin gtt 2350 units/hr, no bolus with recent surgery Check 8 hr heparin level  Ulyses Southward, PharmD, [place], AAHIVP, CPP Infectious Disease Pharmacist 03/22/2020 7:48 PM

## 2020-03-26 NOTE — Anesthesia Procedure Notes (Signed)
Procedure Name: Intubation Date/Time: 04/02/2020 3:15 PM Performed by: Janene Harvey, CRNA Pre-anesthesia Checklist: Patient identified, Emergency Drugs available, Suction available and Patient being monitored Patient Re-evaluated:Patient Re-evaluated prior to induction Oxygen Delivery Method: Circle system utilized Preoxygenation: Pre-oxygenation with 100% oxygen Induction Type: IV induction, Rapid sequence and Cricoid Pressure applied Laryngoscope Size: Mac Grade View: Grade II Tube type: Oral Tube size: 7.5 mm Number of attempts: 1 Airway Equipment and Method: Stylet and Oral airway Placement Confirmation: ETT inserted through vocal cords under direct vision,  positive ETCO2 and breath sounds checked- equal and bilateral Secured at: 22 cm Tube secured with: Tape Dental Injury: Teeth and Oropharynx as per pre-operative assessment

## 2020-03-26 NOTE — Progress Notes (Addendum)
Progress Note    Jorge Ponto.  PRF:[id number] DOB: 04/18/1950  DOA: 03/15/2020 PCP: Alycia Rossetti, MD    Brief Narrative:     Medical records reviewed and are as summarized below:  Jorge Ponto. is an 69 y.o. male with past medical history significant for GERD who presented with progressive abdominal pain.  Noted to have elevated transaminitis and total bilirubin.  CT abdomen/pelvis with IV contrast with intra-/extrahepatic biliary duct dilation with filling defects within the common duct concerning for choledocholithiasis.  Underwent ERCP on 03/28/2020, 3 large common bile duct stones noted, unable to perform a sphincterotomy and stone extraction due to anatomy s/p Billroth II; and had stent placement. 12/16: cholecystectomy.  Drain placed for bile leak on 12/18.  ERCP planned for 21st.  03/11/2020 Increase in abdominal pain overnight, CT abd with free air of unclear etiology, patient undergoing surgical evaluation, no perforation identified but with peritonitis. Patient transferred to the ICU post-operatively as he remains intubated.   Assessment/Plan:   Principal Problem:   Choledocholithiasis Active Problems:   GERD (gastroesophageal reflux disease)   Iron deficiency anemia   Elevated LFTs   Atrial fibrillation with RVR (HCC)   Protein-calorie malnutrition, severe   A fib with RVR- no prior h/o: CHadVAsc2: 2 (age/PVD) -TSH ok Echo unrevealing -IV cardizem titrate for HR 80-100 -add PO BB -heparin gtt -cards consult 03/25/20 Patient with hypotension this morning, cardizem drip held for this reason. BP improved, HR stable in the 70s. Metoprolol $RemoveBeforeD'25mg'LpfCazpPLSrteI$  po BID continued. He denies chest pain. Remains on heparin drip. Cardiology following, recommend uptitrating metoprolol dose if needed, may use Diltiazem again is needed. Continue telemetry, follow electrolytes closely.  03/16/2020 HR up overnight, started on Diltiazem drip again this am, denied chest pain. Having  exploratory laparotomy this afternoon. Cardiology consultant following. Heparin on hold for surgery but to be resumed 4 hours after the surgery.   Choledocholithiasis with developing peritonitits --Gastroenterology, Dr. Benson Norway --Zosyn --s/p cholecystectomy on 12/16 --Pain control and antiemetics -ambulate -ERCP planned for 12/21 by Dr. Rush Landmark  03/25/20 Remains on Zosyn. He has nausea but denies abdominal pain, remains on clear liquid diet. Surgery team recommends to continue monitoring drain output and LFTs, unclear at this time if patient will need for PTC by IR.  03/30/2020 GI following. He had increase in abdominal pain overnight with free air per CT abd, surgery following with exploratory laparotomy today, found to have peritonitis. . Patient started on TPN.  WBC trended up overnight.  Will obtain blood cultures, start Vancomycin and Zosyn   hypokalemia -repleted 03/21/2020 Potassium level normal today.   GERD --Carafate 1 g 4 times daily --Protonix 40 mg IV  Iron deficiency anemia -Fe 03/24/2020 Hgb with drop from 11.7 to 10.8 today. Type screen has been ordered, will continue monitoring post-operatively.   Hiccups Reported as persistent beyond 48 hours.  Will start low dose po thorazine and monitor. 03/25/2020 The hiccups have resolved.     Family Communication/Anticipated D/C date and plan/Code Status   DVT prophylaxis: heparin gtt Code Status: Full Code.  Disposition Plan: Status is: Inpatient Wife at bedside 12/23 and updated on the plan Remains inpatient appropriate because:Inpatient level of care appropriate due to severity of illness   Dispo:  Patient From: Home  Planned Disposition: Home  Expected discharge date: Unclear at this time due to need for further medical evaluation.   Medically stable for discharge: No         Medical  Consultants:    GI  GS  cards  Subjective:  Patient seen and examined prior to surgery today.  His abdominal pain  was reported as well controlled but he seemed to be uncomfortable. Denied chest pain.   Objective:    Vitals:   03/05/2020 1014 03/14/2020 1100 03/09/2020 1140 03/25/2020 1441  BP: (!) 131/96 134/75 (!) 105/59   Pulse: (!) 133 (!) 119 (!) 133   Resp:      Temp:   98.5 F (36.9 C)   TempSrc:   Oral   SpO2:   90%   Weight:    69.9 kg  Height:    5' 5.98" (1.676 m)    Intake/Output Summary (Last 24 hours) at 03/29/2020 1625 Last data filed at 04/01/2020 1616 Gross per 24 hour  Intake 1881.76 ml  Output 1505 ml  Net 376.76 ml   Filed Weights   03/16/2020 1442 03/18/2020 1441  Weight: 69.9 kg 69.9 kg    Exam:   General:    Chronically ill appearing male in mild distress     Lungs:      respirations unlabored, no cough.   Heart:    Tachycardic. irr    MS:   No edema or cyanosis.   Neurologic:   Awake, alert, cooperative     Data Reviewed:   I have personally reviewed following labs:  Labs: Labs show the following:   Basic Metabolic Panel: Recent Labs  Lab 03/21/20 1535 03/22/20 0224 03/23/20 0232 03/25/20 0401 03/17/2020 0216  NA 139 139 136 132* 133*  K 4.1 4.0 3.9 3.8 3.7  CL 108 108 105 99 101  CO2 18* 21* 20* 20* 22  GLUCOSE 131* 106* 99 135* 114*  BUN 26* 29* 24* 21 16  CREATININE 1.63* 1.40* 0.93 1.02 0.93  CALCIUM 7.6* 7.8* 7.7* 7.3* 7.2*  MG  --  1.9  --   --   --    GFR Estimated Creatinine Clearance: 66.7 mL/min (by C-G formula based on SCr of 0.93 mg/dL). Liver Function Tests: Recent Labs  Lab 03/21/20 0455 03/22/20 0224 03/23/20 0232 03/25/20 0401 03/08/2020 0216  AST 104* 57* 78* 93* 121*  ALT 185* 105* 90* 77* 97*  ALKPHOS 89 68 73 75 75  BILITOT 1.9* 1.6* 2.1* 3.2* 2.7*  PROT 5.5* 4.8* 5.0* 5.3* 4.7*  ALBUMIN 2.2* 1.8* 1.7* 1.9* 1.6*   No results for input(s): LIPASE, AMYLASE in the last 168 hours. No results for input(s): AMMONIA in the last 168 hours. Coagulation profile No results for input(s): INR, PROTIME in the last 168  hours.  CBC: Recent Labs  Lab 03/22/20 0224 03/23/20 0232 03/16/2020 0528 03/25/20 0401 03/04/2020 0216  WBC 11.3* 12.0* 15.2* 16.3* 18.7*  HGB 11.1* 11.8* 11.4* 11.7* 10.8*  HCT 32.2* 36.2* 34.3* 35.7* 32.6*  MCV 86.8 87.7 86.0 87.5 86.9  PLT 235 326 347 403* 421*   Cardiac Enzymes: No results for input(s): CKTOTAL, CKMB, CKMBINDEX, TROPONINI in the last 168 hours. BNP (last 3 results) No results for input(s): PROBNP in the last 8760 hours. CBG: Recent Labs  Lab 03/16/2020 1222  GLUCAP 97   D-Dimer: No results for input(s): DDIMER in the last 72 hours. Hgb A1c: No results for input(s): HGBA1C in the last 72 hours. Lipid Profile: No results for input(s): CHOL, HDL, LDLCALC, TRIG, CHOLHDL, LDLDIRECT in the last 72 hours. Thyroid function studies: No results for input(s): TSH, T4TOTAL, T3FREE, THYROIDAB in the last 72 hours.  Invalid input(s): FREET3 Anemia  work up: No results for input(s): VITAMINB12, FOLATE, FERRITIN, TIBC, IRON, RETICCTPCT in the last 72 hours. Sepsis Labs: Recent Labs  Lab 03/23/20 0232 03/29/2020 0528 03/25/20 0401 03/07/2020 0216 04/03/2020 0752  WBC 12.0* 15.2* 16.3* 18.7*  --   LATICACIDVEN  --   --   --   --  1.3    Microbiology Recent Results (from the past 240 hour(s))  Aerobic/Anaerobic Culture (surgical/deep wound)     Status: Abnormal   Collection Time: 03/21/20 10:37 AM   Specimen: Abscess  Result Value Ref Range Status   Specimen Description ABSCESS  Final   Special Requests PERIHEPATIC BILOMA  Final   Gram Stain   Final    FEW WBC PRESENT, PREDOMINANTLY PMN ABUNDANT GRAM NEGATIVE RODS MODERATE GRAM POSITIVE COCCI IN PAIRS IN CHAINS    Culture (A)  Final    MULTIPLE ORGANISMS PRESENT, NONE PREDOMINANT NO ANAEROBES ISOLATED Performed at [facility], [address] [place], [place] [address]    Report Status 03/19/2020 FINAL  Final  MRSA PCR Screening     Status: None   Collection Time: 03/22/20  8:57 PM   Specimen:  Nasopharyngeal  Result Value Ref Range Status   MRSA by PCR NEGATIVE NEGATIVE Final    Comment:        The GeneXpert MRSA Assay (FDA approved for NASAL specimens only), is one component of a comprehensive MRSA colonization surveillance program. It is not intended to diagnose MRSA infection nor to guide or monitor treatment for MRSA infections. Performed at [facility], [place] [address], [place], [place] [address]     Procedures and diagnostic studies:  CT ABDOMEN PELVIS WO CONTRAST  Result Date: 03/25/2020 CLINICAL DATA:  Postoperative abdominal pain. EXAM: CT ABDOMEN AND PELVIS WITHOUT CONTRAST TECHNIQUE: Multidetector CT imaging of the abdomen and pelvis was performed following the standard protocol without IV contrast. COMPARISON:  None. FINDINGS: Lower chest: Mild areas of atelectasis and/or infiltrate are seen within the bilateral lung bases, right greater than left. There is a small right pleural effusion. Hepatobiliary: No focal liver abnormality is seen. A moderate amount of pneumobilia is noted. Status post cholecystectomy. A distal common bile duct stent is in place. Pancreas: A distal pancreatic duct stent is seen. This represents a new finding when compared to the prior study. The remaining visualized portions of the pancreas are unremarkable Spleen: Normal in size without focal abnormality. Adrenals/Urinary Tract: Adrenal glands are unremarkable. Kidneys are normal, without renal calculi, focal lesion, or hydronephrosis. Bladder is unremarkable. Stomach/Bowel: There is a small hiatal hernia. Multiple surgical clips are seen along the gastroesophageal junction. Appendix appears normal. Multiple dilated small bowel loops are seen within the abdomen and pelvis. A gradual transition zone is seen within the mid to lower right abdomen. A moderate amount of intra-abdominal the free air is seen within the anterior aspect of the abdomen. This is increased in severity when compared to  the prior study. Vascular/Lymphatic: Aortic atherosclerosis. No enlarged abdominal or pelvic lymph nodes. Reproductive: The prostate gland is mildly enlarged. Other: Moderate severity anasarca is seen along the lateral aspects of the abdominal and pelvic walls. A surgical drain is seen the entering via the anterolateral aspect of the abdomen on the right. Its distal end is seen adjacent to the lateral aspect of the right lobe of the liver. A trace amount of residual perihepatic fluid is seen within this region. A small amount of posterior pelvic free fluid is seen. Musculoskeletal: Degenerative changes  seen throughout the lumbar spine. IMPRESSION: 1. Moderate amount of intra-abdominal free air, increased in severity when compared to the prior study. While this is likely secondary to the patient's recent surgical intervention, the presence of a perforated hollow viscus cannot completely be excluded. 2. Additional findings consistent with a postoperative ileus. 3. Right-sided surgical drain positioning, as described above, with near-total resolution of the perihepatic fluid seen on the prior study. 4. Interval distal pancreatic duct stent placement since the prior exam. 5. Moderate severity anasarca. 6. Small hiatal hernia. 7. Aortic atherosclerosis. Aortic Atherosclerosis (ICD10-I70.0). Electronically Signed   By: Virgina Norfolk M.D.   On: 03/25/2020 23:07   [place] EKG SITE RITE  Result Date: 03/14/2020 If Site Rite image not attached, placement could not be confirmed due to current cardiac rhythm.   Medications:   . [MAR Hold] acetaminophen  1,000 mg Oral Q8H  . [MAR Hold] digoxin  0.25 mg Intravenous Once   Followed by  . [MAR Hold] digoxin  0.25 mg Intravenous Daily  . [MAR Hold] insulin aspart  0-6 Units Subcutaneous Q6H  . [MAR Hold] lidocaine  1 patch Transdermal Q24H  . [MAR Hold] lidocaine  1 application Urethral Once  . [MAR Hold] metoprolol tartrate  25 mg Oral BID  . [MAR Hold]  pantoprazole (PROTONIX) IV  40 mg Intravenous Q12H  . [MAR Hold] senna-docusate  2 tablet Oral BID  . [MAR Hold] sucralfate  1 g Oral TID WC & HS   Continuous Infusions: . diltiazem (CARDIZEM) infusion 7.5 mg/hr (04/02/2020 1619)  . [MAR Hold] famotidine (PEPCID) IV    . heparin Stopped (03/05/2020 1052)  . lactated ringers 100 mL/hr at 03/06/2020 0333  . lactated ringers    . lactated ringers 10 mL/hr at 03/04/2020 1435  . [MAR Hold] piperacillin-tazobactam (ZOSYN)  IV 3.375 g (03/07/2020 0545)  . TPN ADULT (ION)      Time spent: 25 minutes  LOS: 13 days   Blain Pais  [facility]   03/20/2020, 4:25 PM

## 2020-03-26 NOTE — Anesthesia Procedure Notes (Signed)
Procedure Name: Intubation Date/Time: 04/03/2020 6:16 PM Performed by: Moshe Salisbury, CRNA Pre-anesthesia Checklist: Patient identified, Emergency Drugs available, Suction available and Patient being monitored Patient Re-evaluated:Patient Re-evaluated prior to induction Oxygen Delivery Method: Circle System Utilized Preoxygenation: Pre-oxygenation with 100% oxygen Induction Type: IV induction Ventilation: Mask ventilation without difficulty Laryngoscope Size: Mac and 4 Grade View: Grade I Tube type: Oral Tube size: 8.0 mm Number of attempts: 1 Airway Equipment and Method: Stylet Placement Confirmation: ETT inserted through vocal cords under direct vision,  breath sounds checked- equal and bilateral and CO2 detector Secured at: 21 cm Tube secured with: Tape Dental Injury: Teeth and Oropharynx as per pre-operative assessment

## 2020-03-26 NOTE — Addendum Note (Signed)
Addendum  created 04-22-20 1819 by Shelton Silvas, MD   Order list changed, Pharmacy for encounter modified

## 2020-03-27 ENCOUNTER — Encounter (HOSPITAL_COMMUNITY): Payer: Self-pay | Admitting: Surgery

## 2020-03-27 ENCOUNTER — Inpatient Hospital Stay (HOSPITAL_COMMUNITY): Payer: Medicare Other

## 2020-03-27 DIAGNOSIS — T17908A Unspecified foreign body in respiratory tract, part unspecified causing other injury, initial encounter: Secondary | ICD-10-CM

## 2020-03-27 DIAGNOSIS — K831 Obstruction of bile duct: Secondary | ICD-10-CM

## 2020-03-27 DIAGNOSIS — A419 Sepsis, unspecified organism: Secondary | ICD-10-CM

## 2020-03-27 DIAGNOSIS — R6521 Severe sepsis with septic shock: Secondary | ICD-10-CM

## 2020-03-27 DIAGNOSIS — J80 Acute respiratory distress syndrome: Secondary | ICD-10-CM

## 2020-03-27 LAB — GLUCOSE, CAPILLARY
Glucose-Capillary: 144 mg/dL — ABNORMAL HIGH (ref 70–99)
Glucose-Capillary: 151 mg/dL — ABNORMAL HIGH (ref 70–99)
Glucose-Capillary: 161 mg/dL — ABNORMAL HIGH (ref 70–99)
Glucose-Capillary: 173 mg/dL — ABNORMAL HIGH (ref 70–99)
Glucose-Capillary: 203 mg/dL — ABNORMAL HIGH (ref 70–99)

## 2020-03-27 LAB — POCT I-STAT 7, (LYTES, BLD GAS, ICA,H+H)
Acid-base deficit: 5 mmol/L — ABNORMAL HIGH (ref 0.0–2.0)
Acid-base deficit: 8 mmol/L — ABNORMAL HIGH (ref 0.0–2.0)
Acid-base deficit: 4 mmol/L — ABNORMAL HIGH (ref 0.0–2.0)
Bicarbonate: 20.4 mmol/L (ref 20.0–28.0)
Bicarbonate: 20.8 mmol/L (ref 20.0–28.0)
Bicarbonate: 21.8 mmol/L (ref 20.0–28.0)
Calcium, Ion: 0.99 mmol/L — ABNORMAL LOW (ref 1.15–1.40)
Calcium, Ion: 0.95 mmol/L — ABNORMAL LOW (ref 1.15–1.40)
Calcium, Ion: 0.99 mmol/L — ABNORMAL LOW (ref 1.15–1.40)
HCT: 29 % — ABNORMAL LOW (ref 39.0–52.0)
HCT: 30 % — ABNORMAL LOW (ref 39.0–52.0)
HCT: 29 % — ABNORMAL LOW (ref 39.0–52.0)
Hemoglobin: 9.9 g/dL — ABNORMAL LOW (ref 13.0–17.0)
Hemoglobin: 10.2 g/dL — ABNORMAL LOW (ref 13.0–17.0)
Hemoglobin: 9.9 g/dL — ABNORMAL LOW (ref 13.0–17.0)
O2 Saturation: 93 %
O2 Saturation: 82 %
O2 Saturation: 88 %
Patient temperature: 97.8
Patient temperature: 100
Patient temperature: 100.2
Potassium: 4.3 mmol/L (ref 3.5–5.1)
Potassium: 4.7 mmol/L (ref 3.5–5.1)
Potassium: 4.7 mmol/L (ref 3.5–5.1)
Sodium: 135 mmol/L (ref 135–145)
Sodium: 137 mmol/L (ref 135–145)
Sodium: 135 mmol/L (ref 135–145)
TCO2: 21 mmol/L — ABNORMAL LOW (ref 22–32)
TCO2: 23 mmol/L (ref 22–32)
TCO2: 23 mmol/L (ref 22–32)
pCO2 arterial: 36.8 mmHg (ref 32.0–48.0)
pCO2 arterial: 62 mmHg — ABNORMAL HIGH (ref 32.0–48.0)
pCO2 arterial: 44.8 mmHg (ref 32.0–48.0)
pH, Arterial: 7.349 — ABNORMAL LOW (ref 7.350–7.450)
pH, Arterial: 7.138 — CL (ref 7.350–7.450)
pH, Arterial: 7.299 — ABNORMAL LOW (ref 7.350–7.450)
pO2, Arterial: 68 mmHg — ABNORMAL LOW (ref 83.0–108.0)
pO2, Arterial: 64 mmHg — ABNORMAL LOW (ref 83.0–108.0)
pO2, Arterial: 64 mmHg — ABNORMAL LOW (ref 83.0–108.0)

## 2020-03-27 LAB — COMPREHENSIVE METABOLIC PANEL
ALT: 64 U/L — ABNORMAL HIGH (ref 0–44)
AST: 62 U/L — ABNORMAL HIGH (ref 15–41)
Albumin: 1.8 g/dL — ABNORMAL LOW (ref 3.5–5.0)
Alkaline Phosphatase: 48 U/L (ref 38–126)
Anion gap: 16 — ABNORMAL HIGH (ref 5–15)
BUN: 19 mg/dL (ref 8–23)
CO2: 14 mmol/L — ABNORMAL LOW (ref 22–32)
Calcium: 7 mg/dL — ABNORMAL LOW (ref 8.9–10.3)
Chloride: 104 mmol/L (ref 98–111)
Creatinine, Ser: 1.01 mg/dL (ref 0.61–1.24)
GFR, Estimated: >60 mL/min (ref >60)
Glucose, Bld: 142 mg/dL — ABNORMAL HIGH (ref 70–99)
Potassium: 4.2 mmol/L (ref 3.5–5.1)
Sodium: 134 mmol/L — ABNORMAL LOW (ref 135–145)
Total Bilirubin: 4.2 mg/dL — ABNORMAL HIGH (ref 0.3–1.2)
Total Protein: 4.1 g/dL — ABNORMAL LOW (ref 6.5–8.1)

## 2020-03-27 LAB — CBC
HCT: 35.6 % — ABNORMAL LOW (ref 39.0–52.0)
Hemoglobin: 11.4 g/dL — ABNORMAL LOW (ref 13.0–17.0)
MCH: 28.9 pg (ref 26.0–34.0)
MCHC: 32 g/dL (ref 30.0–36.0)
MCV: 90.1 fL (ref 80.0–100.0)
Platelets: 370 10*3/uL (ref 150–400)
RBC: 3.95 MIL/uL — ABNORMAL LOW (ref 4.22–5.81)
RDW: 13.6 % (ref 11.5–15.5)
WBC: 17.4 10*3/uL — ABNORMAL HIGH (ref 4.0–10.5)
nRBC: 0 % (ref 0.0–0.2)

## 2020-03-27 LAB — DIFFERENTIAL
Abs Immature Granulocytes: 0.86 10*3/uL — ABNORMAL HIGH (ref 0.00–0.07)
Basophils Absolute: 0.1 10*3/uL (ref 0.0–0.1)
Basophils Relative: 1 %
Eosinophils Absolute: 0 10*3/uL (ref 0.0–0.5)
Eosinophils Relative: 0 %
Immature Granulocytes: 5 %
Lymphocytes Relative: 3 %
Lymphs Abs: 0.5 10*3/uL — ABNORMAL LOW (ref 0.7–4.0)
Monocytes Absolute: 0.2 10*3/uL (ref 0.1–1.0)
Monocytes Relative: 1 %
Neutro Abs: 15.6 10*3/uL — ABNORMAL HIGH (ref 1.7–7.7)
Neutrophils Relative %: 90 %

## 2020-03-27 LAB — LACTIC ACID, PLASMA: Lactic Acid, Venous: 3 mmol/L (ref 0.5–1.9)

## 2020-03-27 LAB — PREALBUMIN: Prealbumin: 7.3 mg/dL — ABNORMAL LOW (ref 18–38)

## 2020-03-27 LAB — MAGNESIUM: Magnesium: 2 mg/dL (ref 1.7–2.4)

## 2020-03-27 LAB — PHOSPHORUS: Phosphorus: 3.7 mg/dL (ref 2.5–4.6)

## 2020-03-27 LAB — HEPARIN LEVEL (UNFRACTIONATED)
Heparin Unfractionated: 0.34 IU/mL (ref 0.30–0.70)
Heparin Unfractionated: 0.37 IU/mL (ref 0.30–0.70)

## 2020-03-27 LAB — TRIGLYCERIDES: Triglycerides: 108 mg/dL (ref <150)

## 2020-03-27 MED ORDER — PHENYLEPHRINE CONCENTRATED 100MG/250ML (0.4 MG/ML) INFUSION SIMPLE
0.0000 ug/min | INTRAVENOUS | Status: DC
  Administered 2020-03-27: 175 ug/min via INTRAVENOUS
  Filled 2020-03-27: qty 250

## 2020-03-27 MED ORDER — FENTANYL 2500MCG IN NS 250ML (10MCG/ML) PREMIX INFUSION
25.0000 ug/h | INTRAVENOUS | Status: DC
  Administered 2020-03-27: 08:00:00 50 ug/h via INTRAVENOUS
  Administered 2020-03-28 (×2): 150 ug/h via INTRAVENOUS
  Administered 2020-03-30 – 2020-04-02 (×6): 200 ug/h via INTRAVENOUS
  Administered 2020-04-03: 150 ug/h via INTRAVENOUS
  Filled 2020-03-27 (×16): qty 250

## 2020-03-27 MED ORDER — TRAVASOL 10 % IV SOLN
INTRAVENOUS | Status: AC
  Filled 2020-03-27: qty 504

## 2020-03-27 MED ORDER — VECURONIUM BROMIDE 10 MG IV SOLR
INTRAVENOUS | Status: AC
  Administered 2020-03-27: 10:00:00 10 mg
  Filled 2020-03-27: qty 10

## 2020-03-27 MED ORDER — MIDAZOLAM HCL 2 MG/2ML IJ SOLN
INTRAMUSCULAR | Status: AC
  Administered 2020-03-27: 10:00:00 2 mg
  Filled 2020-03-27: qty 2

## 2020-03-27 MED ORDER — STERILE WATER FOR INJECTION IV SOLN
INTRAVENOUS | Status: DC
  Filled 2020-03-27 (×2): qty 850

## 2020-03-27 MED ORDER — ACETAMINOPHEN 650 MG RE SUPP
650.0000 mg | Freq: Four times a day (QID) | RECTAL | Status: DC | PRN
  Administered 2020-03-28 (×2): 650 mg via RECTAL
  Filled 2020-03-27 (×2): qty 1

## 2020-03-27 MED ORDER — PHENYLEPHRINE CONCENTRATED 100MG/250ML (0.4 MG/ML) INFUSION SIMPLE
25.0000 ug/min | INTRAVENOUS | Status: DC
  Filled 2020-03-27: qty 250

## 2020-03-27 MED ORDER — MORPHINE SULFATE (PF) 2 MG/ML IV SOLN: 2.0000 mg | INTRAVENOUS | Status: DC | PRN

## 2020-03-27 MED ORDER — FENTANYL BOLUS VIA INFUSION
25.0000 ug | INTRAVENOUS | Status: DC | PRN
  Administered 2020-03-27 – 2020-03-30 (×18): 25 ug via INTRAVENOUS
  Filled 2020-03-27: qty 25

## 2020-03-27 MED ORDER — METHOCARBAMOL 1000 MG/10ML IJ SOLN
500.0000 mg | Freq: Three times a day (TID) | INTRAVENOUS | Status: DC
  Filled 2020-03-27 (×3): qty 5

## 2020-03-27 MED ORDER — ACETAMINOPHEN 325 MG PO TABS
650.0000 mg | ORAL_TABLET | Freq: Four times a day (QID) | ORAL | Status: DC | PRN
  Administered 2020-03-31 – 2020-04-01 (×4): 650 mg
  Filled 2020-03-27 (×4): qty 2

## 2020-03-27 MED ORDER — SODIUM BICARBONATE 8.4 % IV SOLN
50.0000 meq | INTRAVENOUS | Status: AC
  Administered 2020-03-27: 17:00:00 50 meq via INTRAVENOUS

## 2020-03-27 MED ORDER — VECURONIUM BROMIDE 10 MG IV SOLR: 10.0000 mg | Freq: Once | INTRAVENOUS | Status: AC

## 2020-03-27 MED ORDER — VANCOMYCIN HCL 1500 MG/300ML IV SOLN: 1500.0000 mg | INTRAVENOUS | Status: DC

## 2020-03-27 MED ORDER — LACTATED RINGERS IV SOLN: INTRAVENOUS | Status: DC

## 2020-03-27 MED ORDER — SENNOSIDES-DOCUSATE SODIUM 8.6-50 MG PO TABS
2.0000 | ORAL_TABLET | Freq: Two times a day (BID) | ORAL | Status: DC
  Administered 2020-03-27 – 2020-04-03 (×11): 2
  Filled 2020-03-27 (×12): qty 2

## 2020-03-27 MED ORDER — FENTANYL CITRATE (PF) 100 MCG/2ML IJ SOLN
25.0000 ug | Freq: Once | INTRAMUSCULAR | Status: AC
  Administered 2020-03-27: 08:00:00 25 ug via INTRAVENOUS

## 2020-03-27 MED ORDER — MIDAZOLAM HCL 2 MG/2ML IJ SOLN: 2.0000 mg | Freq: Once | INTRAMUSCULAR | Status: AC

## 2020-03-27 MED ORDER — VANCOMYCIN HCL 1750 MG/350ML IV SOLN
1750.0000 mg | INTRAVENOUS | Status: AC
  Administered 2020-03-27: 08:00:00 1750 mg via INTRAVENOUS
  Filled 2020-03-27: qty 350

## 2020-03-27 MED ORDER — SODIUM BICARBONATE 8.4 % IV SOLN
INTRAVENOUS | Status: AC
  Filled 2020-03-27: qty 50

## 2020-03-27 MED ORDER — CALCIUM GLUCONATE-NACL 1-0.675 GM/50ML-% IV SOLN
1.0000 g | Freq: Once | INTRAVENOUS | Status: AC
  Administered 2020-03-27: 18:00:00 1000 mg via INTRAVENOUS
  Filled 2020-03-27: qty 50

## 2020-03-27 NOTE — Progress Notes (Signed)
ANTICOAGULATION CONSULT NOTE  Pharmacy Consult:  Heparin Indication: atrial fibrillation  Allergies  Allergen Reactions  . Other Itching  . Demerol [Meperidine] Rash    Patient Measurements: Height: 5' 5.98" (167.6 cm) Weight: 69.9 kg (154 lb) IBW/kg (Calculated) : 63.76 Heparin Dosing Weight: 69 kg  Vital Signs: Temp: 98 F (36.7 C) (12/24 0500) Temp Source: Axillary (12/24 0500) BP: 109/46 (12/24 0500) Pulse Rate: 73 (12/24 0400)  Labs: Recent Labs    03/25/20 0401 03/25/20 1539 2020-04-07 0216 04-07-20 1559 04-07-20 1749 03/27/20 0028 03/27/20 0500  HGB 11.7*  --  10.8* 10.2* 11.6* 11.4*  --   HCT 35.7*  --  32.6* 30.0* 34.0* 35.6*  --   PLT 403*  --  421*  --   --  370  --   HEPARINUNFRC <0.10* <0.10* 0.13*  --   --   --  0.34  CREATININE 1.02  --  0.93  --   --  1.01  --     Estimated Creatinine Clearance: 61.4 mL/min (by C-G formula based on SCr of 1.01 mg/dL).    Assessment: 65 yoM admitted with abdominal pain s/p ERCP 12/10, cholecystectomy 12/16, and drain placement 12/18 and 12/23.  Patient developed AFib and pharmacy consulted for heparin dosing.  Heparin level therapeutic (0.34) on gtt at 2350 units/hr. No bleeding noted.  Goal of Therapy:  Heparin level 0.3-0.7 units/ml Monitor platelets by anticoagulation protocol: Yes   Plan:  Continue heparin gtt 2350 units/hr F/u 6 hr confirmatory heparin level  Christoper Fabian, PharmD, BCPS Please see amion for complete clinical pharmacist phone list 03/27/2020 5:44 AM

## 2020-03-27 NOTE — Procedures (Signed)
Bedside Bronchoscopy Procedure Note Jorge Evans [id number] 1951-04-01  Procedure: Bronchoscopy Indications: Diagnostic evaluation of the airways and Remove secretions  Procedure Details: ET Tube Size: ET Tube secured at lip (cm): Bite block in place: No In preparation for procedure, Patient hyper-oxygenated with 100 % FiO2 Airway entered and the following bronchi were examined: RUL, RML, RLL, LUL, LLL and Bronchi.   Bronchoscope removed.  , Patient placed back on 100% FiO2 at conclusion of procedure.    Evaluation BP (!) 98/41   Pulse 74   Temp 97.8 F (36.6 C) (Axillary)   Resp (!) 26   Ht 5' 5.75" (1.67 m)   Wt 69.9 kg   SpO2 90%   BMI 25.05 kg/m  Breath Sounds:Diminished O2 sats: stable throughout and currently acceptable Patient's Current Condition: stable Specimens:  None Complications: No apparent complications Patient did tolerate procedure well.   [name] 03/27/2020, 11:15 AM

## 2020-03-27 NOTE — Progress Notes (Signed)
RT notified MD of critical on ABG. ABG as follows: pH 7.13, CO2 62, PO2 64 and HCO3 20.8. RT waiting for new orders. RT will continue to monitor.

## 2020-03-27 NOTE — Progress Notes (Signed)
ANTICOAGULATION CONSULT NOTE  Pharmacy Consult:  Heparin Indication: atrial fibrillation  Allergies  Allergen Reactions  . Other Itching  . Demerol [Meperidine] Rash    Patient Measurements: Height: 5' 5.75" (167 cm) Weight: 69.9 kg (154 lb) IBW/kg (Calculated) : 63.22 Heparin Dosing Weight: 69 kg  Vital Signs: Temp: 100 F (37.8 C) (12/24 1200) Temp Source: Axillary (12/24 1200) BP: 100/33 (12/24 1200) Pulse Rate: 81 (12/24 1100)  Labs: Recent Labs    03/25/20 0401 03/25/20 1539 03/17/2020 0216 03/25/2020 1559 03/27/2020 1749 03/27/20 0028 03/27/20 0500 03/27/20 0852  HGB 11.7*  --  10.8*   < > 11.6* 11.4*  --  9.9*  HCT 35.7*  --  32.6*   < > 34.0* 35.6*  --  29.0*  PLT 403*  --  421*  --   --  370  --   --   HEPARINUNFRC <0.10* <0.10* 0.13*  --   --   --  0.34  --   CREATININE 1.02  --  0.93  --   --  1.01  --   --    < > = values in this interval not displayed.    Estimated Creatinine Clearance: 60.8 mL/min (by C-G formula based on SCr of 1.01 mg/dL).    Assessment: 79 yoM admitted with abdominal pain s/p ERCP 12/10, cholecystectomy 12/16, and drain placement 12/18 and 12/23.  Patient developed AFib and pharmacy consulted for heparin dosing.  Heparin off for bronch this morning, resumed at 1100 per discussion with RN. Will re-time heparin level. Hg down to 9.9, plt wnl. No bleeding or issues with infusion per RN.  Goal of Therapy:  Heparin level 0.3-0.7 units/ml Monitor platelets by anticoagulation protocol: Yes   Plan:  Continue heparin IV at 2350 units/hr Re-time heparin level for 6 hrs after resumption Monitor daily CBC, s/sx bleeding   Leia Alf, PharmD, BCPS Please check AMION for all [facility] contact numbers Clinical Pharmacist 03/27/2020 1:42 PM

## 2020-03-27 NOTE — Procedures (Signed)
Bronchoscopy Procedure Note  Jorge Evans  [id number]  [birth year]  Date:03/27/20  Time:11:16 AM   Provider Performing:Jalynne Persico V. Camilia Caywood   Procedure(s):  Flexible Bronchoscopy [phone])  Indication(s) Aspiration/ atelectasis  Consent Risks of the procedure as well as the alternatives and risks of each were explained to the patient and/or caregiver.  Consent for the procedure was obtained and is signed in the bedside chart  Anesthesia Precedex/fentanyl   Time Out Verified patient identification, verified procedure, site/side was marked, verified correct patient position, special equipment/implants available, medications/allergies/relevant history reviewed, required imaging and test results available.   Sterile Technique Usual hand hygiene, masks, gowns, and gloves were used   Procedure Description Bronchoscope advanced through endotracheal tube and into airway.  Airways were examined down to subsegmental level with findings noted below.   Following diagnostic evaluation, BAL(s) performed in LLL with normal saline and return of 5cc fluid  Findings: Bile staining of lower airways confirm aspiration. Minimal mucoid secretions suctioned out from LLL    Complications/Tolerance None; patient tolerated the procedure well. Chest X-ray is not needed post procedure.   EBL Minimal   Specimen(s) BAL for culture  Jorge Evans V. Elsworth Soho MD

## 2020-03-27 NOTE — Progress Notes (Signed)
General Surgery Follow Up Note  Subjective:    Overnight Issues:   Objective:  Vital signs for last 24 hours: Temp:  [97 F (36.1 C)-98.5 F (36.9 C)] 97.8 F (36.6 C) (12/24 0800) Pulse Rate:  [66-133] 85 (12/24 0900) Resp:  [13-44] 27 (12/24 0900) BP: (41-155)/(32-97) 101/46 (12/24 0900) SpO2:  [79 %-99 %] 91 % (12/24 0900) Arterial Line BP: (70-180)/(44-79) 132/60 (12/23 1830) FiO2 (%):  [50 %-100 %] 80 % (12/24 0900) Weight:  [69.9 kg] 69.9 kg (12/23 1441)  Hemodynamic parameters for last 24 hours: CVP:  [15 mmHg] 15 mmHg  Intake/Output from previous day: 12/23 0701 - 12/24 0700 In: 5327.7 [I.V.:4918.8; NG/GT:40; IV Piggyback:368.9] Out: 1550 [Urine:490; Drains:635; Blood:25]  Intake/Output this shift: Total I/O In: 241.4 [I.V.:229; IV Piggyback:12.4] Out: -   Vent settings for last 24 hours: Vent Mode: PRVC FiO2 (%):  [50 %-100 %] 80 % Set Rate:  [18 bmp-28 bmp] 28 bmp Vt Set:  [380 mL-470 mL] 380 mL PEEP:  [8 cmH20] 8 cmH20 Plateau Pressure:  [22 cmH20] 22 cmH20  Physical Exam:  Gen: comfortable, no distress Neuro: sedated, does not follow commands HEENT: intubated Neck: supple CV: RRR Pulm: unlabored breathing, mechanically ventilated Abd: soft, nontender, incisions c/d/i with dermabond, IR drain x1-bilious and surgical drain x1-bile tinged GU: clear, yellow urine Extr: wwp, no edema   Results for orders placed or performed during the hospital encounter of 03/30/2020 (from the past 24 hour(s))  Glucose, capillary     Status: None   Collection Time: 03/10/2020 12:22 PM  Result Value Ref Range   Glucose-Capillary 97 70 - 99 mg/dL  Type and screen [facility]     Status: None   Collection Time: 03/14/2020  2:43 PM  Result Value Ref Range   ABO/RH(D) O NEG    Antibody Screen NEG    Sample Expiration      03/29/2020,2359 Performed at [facility], [address], [place], [place] [address]   I-STAT 7, (LYTES, BLD GAS, ICA, H+H)      Status: Abnormal   Collection Time: 03/29/2020  3:59 PM  Result Value Ref Range   pH, Arterial 7.283 (L) 7.350 - 7.450   pCO2 arterial 50.6 (H) 32.0 - 48.0 mmHg   pO2, Arterial 99 83.0 - 108.0 mmHg   Bicarbonate 24.0 20.0 - 28.0 mmol/L   TCO2 26 22 - 32 mmol/L   O2 Saturation 97.0 %   Acid-base deficit 3.0 (H) 0.0 - 2.0 mmol/L   Sodium 134 (L) 135 - 145 mmol/L   Potassium 3.7 3.5 - 5.1 mmol/L   Calcium, Ion 1.07 (L) 1.15 - 1.40 mmol/L   HCT 30.0 (L) 39.0 - 52.0 %   Hemoglobin 10.2 (L) 13.0 - 17.0 g/dL   Patient temperature 53.2 C    Sample type ARTERIAL   I-STAT 7, (LYTES, BLD GAS, ICA, H+H)     Status: Abnormal   Collection Time: 03/18/2020  5:49 PM  Result Value Ref Range   pH, Arterial 7.330 (L) 7.350 - 7.450   pCO2 arterial 42.3 32.0 - 48.0 mmHg   pO2, Arterial 64 (L) 83.0 - 108.0 mmHg   Bicarbonate 22.3 20.0 - 28.0 mmol/L   TCO2 24 22 - 32 mmol/L   O2 Saturation 91.0 %   Acid-base deficit 4.0 (H) 0.0 - 2.0 mmol/L   Sodium 134 (L) 135 - 145 mmol/L   Potassium 3.8 3.5 - 5.1 mmol/L   Calcium, Ion 1.04 (L)  1.15 - 1.40 mmol/L   HCT 34.0 (L) 39.0 - 52.0 %   Hemoglobin 11.6 (L) 13.0 - 17.0 g/dL   Sample type ARTERIAL   Glucose, capillary     Status: Abnormal   Collection Time: 03/20/2020 11:51 PM  Result Value Ref Range   Glucose-Capillary 144 (H) 70 - 99 mg/dL  Comprehensive metabolic panel     Status: Abnormal   Collection Time: 03/27/20 12:28 AM  Result Value Ref Range   Sodium 134 (L) 135 - 145 mmol/L   Potassium 4.2 3.5 - 5.1 mmol/L   Chloride 104 98 - 111 mmol/L   CO2 14 (L) 22 - 32 mmol/L   Glucose, Bld 142 (H) 70 - 99 mg/dL   BUN 19 8 - 23 mg/dL   Creatinine, Ser 4.65 0.61 - 1.24 mg/dL   Calcium 7.0 (L) 8.9 - 10.3 mg/dL   Total Protein 4.1 (L) 6.5 - 8.1 g/dL   Albumin 1.8 (L) 3.5 - 5.0 g/dL   AST 62 (H) 15 - 41 U/L   ALT 64 (H) 0 - 44 U/L   Alkaline Phosphatase 48 38 - 126 U/L   Total Bilirubin 4.2 (H) 0.3 - 1.2 mg/dL   GFR, Estimated >03 >54 mL/min   Anion  gap 16 (H) 5 - 15  Prealbumin     Status: Abnormal   Collection Time: 03/27/20 12:28 AM  Result Value Ref Range   Prealbumin 7.3 (L) 18 - 38 mg/dL  Magnesium     Status: None   Collection Time: 03/27/20 12:28 AM  Result Value Ref Range   Magnesium 2.0 1.7 - 2.4 mg/dL  Phosphorus     Status: None   Collection Time: 03/27/20 12:28 AM  Result Value Ref Range   Phosphorus 3.7 2.5 - 4.6 mg/dL  Triglycerides     Status: None   Collection Time: 03/27/20 12:28 AM  Result Value Ref Range   Triglycerides 108 <150 mg/dL  CBC     Status: Abnormal   Collection Time: 03/27/20 12:28 AM  Result Value Ref Range   WBC 17.4 (H) 4.0 - 10.5 K/uL   RBC 3.95 (L) 4.22 - 5.81 MIL/uL   Hemoglobin 11.4 (L) 13.0 - 17.0 g/dL   HCT 65.6 (L) 81.2 - 75.1 %   MCV 90.1 80.0 - 100.0 fL   MCH 28.9 26.0 - 34.0 pg   MCHC 32.0 30.0 - 36.0 g/dL   RDW 70.0 17.4 - 94.4 %   Platelets 370 150 - 400 K/uL   nRBC 0.0 0.0 - 0.2 %  Differential     Status: Abnormal   Collection Time: 03/27/20 12:28 AM  Result Value Ref Range   Neutrophils Relative % 90 %   Neutro Abs 15.6 (H) 1.7 - 7.7 K/uL   Lymphocytes Relative 3 %   Lymphs Abs 0.5 (L) 0.7 - 4.0 K/uL   Monocytes Relative 1 %   Monocytes Absolute 0.2 0.1 - 1.0 K/uL   Eosinophils Relative 0 %   Eosinophils Absolute 0.0 0.0 - 0.5 K/uL   Basophils Relative 1 %   Basophils Absolute 0.1 0.0 - 0.1 K/uL   Immature Granulocytes 5 %   Abs Immature Granulocytes 0.86 (H) 0.00 - 0.07 K/uL  Heparin level (unfractionated)     Status: None   Collection Time: 03/27/20  5:00 AM  Result Value Ref Range   Heparin Unfractionated 0.34 0.30 - 0.70 IU/mL  Glucose, capillary     Status: Abnormal   Collection Time: 03/27/20  5:41 AM  Result Value Ref Range   Glucose-Capillary 173 (H) 70 - 99 mg/dL  Glucose, capillary     Status: Abnormal   Collection Time: 03/27/20  8:18 AM  Result Value Ref Range   Glucose-Capillary 161 (H) 70 - 99 mg/dL  I-STAT 7, (LYTES, BLD GAS, ICA, H+H)      Status: Abnormal   Collection Time: 03/27/20  8:52 AM  Result Value Ref Range   pH, Arterial 7.349 (L) 7.350 - 7.450   pCO2 arterial 36.8 32.0 - 48.0 mmHg   pO2, Arterial 68 (L) 83.0 - 108.0 mmHg   Bicarbonate 20.4 20.0 - 28.0 mmol/L   TCO2 21 (L) 22 - 32 mmol/L   O2 Saturation 93.0 %   Acid-base deficit 5.0 (H) 0.0 - 2.0 mmol/L   Sodium 135 135 - 145 mmol/L   Potassium 4.3 3.5 - 5.1 mmol/L   Calcium, Ion 0.99 (L) 1.15 - 1.40 mmol/L   HCT 29.0 (L) 39.0 - 52.0 %   Hemoglobin 9.9 (L) 13.0 - 17.0 g/dL   Patient temperature 16.0 F    Collection site Web designer by Operator    Sample type ARTERIAL     Assessment & Plan: The plan of care was discussed with the bedside nurse for the day, who is in agreement with this plan and no additional concerns were raised.   Present on Admission: . Choledocholithiasis . GERD (gastroesophageal reflux disease) . Iron deficiency anemia    LOS: 14 days   Additional comments:I reviewed the patient's new clinical lab test results.   and I reviewed the patients new imaging test results.    GERD IDA PMH Bilroth II for bleeding gastric ulcers ABL anemia - Hgb11.7, stable A. Fib with RVR - on heparin and diltiazem gtts  Symptomatic cholelithiasis Choledocholithiasis without evidence of acute cholecystitis  S/p ERCP, placement of CBD stent 12/10 [name] -unable to perform a sphincterotomy and stone extraction due to anatomy s/p Billroth II S/plaparoscopic cholecystectomy12/16 Dr. Janee Morn Postoperative bile leak             - POD#8             - CT scan 12/17 revealed moderate volumeperihepatic free fluid containing foci of air with non organized fluid and air in the gallbladder fossaconcerning for biloma             - s/p IR drain placement 12/18 with yield high volume bilious fluid, culture pending             -s/p ERCP with sphincterotomy/sphincteroplasty, stent placement and lithotripsy 12/21 by Dr. Meridee Score             -  Tbili 4.2 from 3.2 yesterday             - drain output remains bilious and 160cc in last 24h   - to OR for laparoscopic exploration, washout and drain placement 12/23 by Dr. Corliss Skains, this drain is bile tinged, 475cc o/p in24h  ID: zosyn 12/18>>at least 12/27, aspirated on induction in OR 12/23, monitor clinically, already on zosyn FEN:IVF,TPN, anticipate ileus, but can trial trickle TF when passing flatus/BM VTE: SCDs,heparin gtt   Diamantina Monks, MD Trauma & General Surgery Please use [email or web address] to contact on call provider  03/27/2020  *Care during the described time interval was provided by me. I have reviewed this patient's available data, including medical history, events of note, physical examination and test results as part of my  evaluation.

## 2020-03-27 NOTE — Progress Notes (Signed)
Progress Note   Subjective  Events noted yesterday. Taken to OR for ex lap, washout and drain placement. No obvious evidence of perforation. Unfortunately course has been complicated by aspiration, difficulty maintaining oxygen sats on the vent currently. Scheduled for bronchoscopy this AM. Spoke with wife.    Objective   Vital signs in last 24 hours: Temp:  [97 F (36.1 C)-98.5 F (36.9 C)] 97.8 F (36.6 C) (12/24 0800) Pulse Rate:  [66-133] 74 (12/24 1000) Resp:  [13-44] 26 (12/24 1000) BP: (41-155)/(32-97) 98/41 (12/24 1000) SpO2:  [79 %-99 %] 90 % (12/24 1000) Arterial Line BP: (70-180)/(44-79) 132/60 (12/23 1830) FiO2 (%):  [50 %-100 %] 80 % (12/24 0900) Weight:  [69.9 kg] 69.9 kg (12/23 1441) Last BM Date: 03/25/20 General:    white male on vent Pulm - intubated CV - RRR Abdomen:  Soft, nontender . Extremities:  Without edema.   Intake/Output from previous day: 12/23 0701 - 12/24 0700 In: 5327.7 [I.V.:4918.8; NG/GT:40; IV Piggyback:368.9] Out: 1550 [Urine:490; Drains:635; Blood:25] Intake/Output this shift: Total I/O In: 241.4 [I.V.:229; IV Piggyback:12.4] Out: 245 [Drains:245]  Lab Results: Recent Labs    03/25/20 0401 Apr 19, 2020 0216 04/19/2020 1559 April 19, 2020 1749 03/27/20 0028 03/27/20 0852  WBC 16.3* 18.7*  --   --  17.4*  --   HGB 11.7* 10.8*   < > 11.6* 11.4* 9.9*  HCT 35.7* 32.6*   < > 34.0* 35.6* 29.0*  PLT 403* 421*  --   --  370  --    < > = values in this interval not displayed.   BMET Recent Labs    03/25/20 0401 04-19-2020 0216 19-Apr-2020 1559 2020/04/19 1749 03/27/20 0028 03/27/20 0852  NA 132* 133*   < > 134* 134* 135  K 3.8 3.7   < > 3.8 4.2 4.3  CL 99 101  --   --  104  --   CO2 20* 22  --   --  14*  --   GLUCOSE 135* 114*  --   --  142*  --   BUN 21 16  --   --  19  --   CREATININE 1.02 0.93  --   --  1.01  --   CALCIUM 7.3* 7.2*  --   --  7.0*  --    < > = values in this interval not displayed.   LFT Recent Labs     03/27/20 0028  PROT 4.1*  ALBUMIN 1.8*  AST 62*  ALT 64*  ALKPHOS 48  BILITOT 4.2*   PT/INR No results for input(s): LABPROT, INR in the last 72 hours.  Studies/Results: CT ABDOMEN PELVIS WO CONTRAST  Result Date: 03/25/2020 CLINICAL DATA:  Postoperative abdominal pain. EXAM: CT ABDOMEN AND PELVIS WITHOUT CONTRAST TECHNIQUE: Multidetector CT imaging of the abdomen and pelvis was performed following the standard protocol without IV contrast. COMPARISON:  None. FINDINGS: Lower chest: Mild areas of atelectasis and/or infiltrate are seen within the bilateral lung bases, right greater than left. There is a small right pleural effusion. Hepatobiliary: No focal liver abnormality is seen. A moderate amount of pneumobilia is noted. Status post cholecystectomy. A distal common bile duct stent is in place. Pancreas: A distal pancreatic duct stent is seen. This represents a new finding when compared to the prior study. The remaining visualized portions of the pancreas are unremarkable Spleen: Normal in size without focal abnormality. Adrenals/Urinary Tract: Adrenal glands are unremarkable. Kidneys are normal, without renal calculi, focal lesion,  or hydronephrosis. Bladder is unremarkable. Stomach/Bowel: There is a small hiatal hernia. Multiple surgical clips are seen along the gastroesophageal junction. Appendix appears normal. Multiple dilated small bowel loops are seen within the abdomen and pelvis. A gradual transition zone is seen within the mid to lower right abdomen. A moderate amount of intra-abdominal the free air is seen within the anterior aspect of the abdomen. This is increased in severity when compared to the prior study. Vascular/Lymphatic: Aortic atherosclerosis. No enlarged abdominal or pelvic lymph nodes. Reproductive: The prostate gland is mildly enlarged. Other: Moderate severity anasarca is seen along the lateral aspects of the abdominal and pelvic walls. A surgical drain is seen the entering  via the anterolateral aspect of the abdomen on the right. Its distal end is seen adjacent to the lateral aspect of the right lobe of the liver. A trace amount of residual perihepatic fluid is seen within this region. A small amount of posterior pelvic free fluid is seen. Musculoskeletal: Degenerative changes seen throughout the lumbar spine. IMPRESSION: 1. Moderate amount of intra-abdominal free air, increased in severity when compared to the prior study. While this is likely secondary to the patient's recent surgical intervention, the presence of a perforated hollow viscus cannot completely be excluded. 2. Additional findings consistent with a postoperative ileus. 3. Right-sided surgical drain positioning, as described above, with near-total resolution of the perihepatic fluid seen on the prior study. 4. Interval distal pancreatic duct stent placement since the prior exam. 5. Moderate severity anasarca. 6. Small hiatal hernia. 7. Aortic atherosclerosis. Aortic Atherosclerosis (ICD10-I70.0). Electronically Signed   By: Aram Candela M.D.   On: 03/25/2020 23:07   DG Chest Port 1 View  Result Date: 03/27/2020 CLINICAL DATA:  ARDS, intubation EXAM: PORTABLE CHEST 1 VIEW COMPARISON:  Portable exam 0826 hours compared to 03/25/2020 FINDINGS: Tip of endotracheal tube projects 3.9 cm above carina. Nasogastric tube extends into stomach. RIGHT arm PICC line tip projects over cavoatrial junction. Pigtail RIGHT thoracostomy tube at lung base. Enlargement of cardiac silhouette. Mediastinal contours and pulmonary vascularity normal. Atherosclerotic calcification aorta. Atelectasis versus consolidation LEFT lower lobe. Abrupt cut off of LEFT lower lobe bronchus, could be due to mucous plugging or mass. Patchy infiltrate LEFT upper lobe and throughout RIGHT lung. No pleural effusion or pneumothorax. IMPRESSION: Line and tube positions as above. Enlargement of cardiac silhouette with mild RIGHT basilar atelectasis and  persistent atelectasis versus consolidation of LEFT lower lobe. Abrupt cut off of LEFT lower lobe bronchus question mucous plugging or mass; recommend radiographic follow-up and if this fails to resolve CT imaging. Electronically Signed   By: Ulyses Southward M.D.   On: 03/27/2020 09:22   DG CHEST PORT 1 VIEW  Result Date: 03/29/2020 CLINICAL DATA:  PICC line placement, recent endoscopy and bile duct stent placement, previous abnormal chest x-ray EXAM: PORTABLE CHEST 1 VIEW COMPARISON:  03/23/2020 at 5:19 p.m. FINDINGS: Single frontal view of the chest demonstrates endotracheal tube overlying tracheal air column tip just below thoracic inlet. Enteric catheter tip and side port project over the gastric body. Right-sided PICC tip projects over the atriocaval junction. Cardiac silhouette is stable. There is progressive bilateral consolidation, most pronounced within the left lower lobe. This may reflect a combination of atelectasis and airspace disease. Small bilateral pleural effusions. No pneumothorax. No acute bony abnormalities. Pigtail drain overlies the right upper quadrant. IMPRESSION: 1. Progressive bilateral lung consolidation consistent with airspace disease and/or atelectasis. 2. Trace bilateral pleural effusions. 3. Support devices as above. Electronically Signed  By: Sharlet Salina M.D.   On: 03/25/2020 21:20   DG Chest Port 1 View  Result Date: 03/25/2020 CLINICAL DATA:  NG tube placement. EXAM: PORTABLE CHEST 1 VIEW COMPARISON:  03/23/2020 FINDINGS: Surgical clips projecting over the gastroesophageal junction. Nasogastric tube terminates at the body of the stomach. Right upper quadrant percutaneous drain. Midline trachea. Moderate cardiomegaly. No pleural effusion or pneumothorax. No congestive failure. Improved right base airspace disease. Persistent left base airspace disease. Suspect inferior right upper lobe subtle airspace opacity. IMPRESSION: Nasogastric tube terminating at the body of the  stomach. Cardiomegaly without congestive failure. Improved right and similar left base atelectasis. Possible developing right upper lobe airspace disease. Consider radiographic follow-up to exclude pneumonia or aspiration. Electronically Signed   By: Jeronimo Greaves M.D.   On: 03/28/2020 17:39   [place] EKG SITE RITE  Result Date: 03/07/2020 If Site Rite image not attached, placement could not be confirmed due to current cardiac rhythm.      Assessment / Plan:    69 y/o male with choledocholithiasis, had cholecystectomy complicated by bile leak. ERCP with stent placement 12/21, multiple stones removed, one large one was not, biliary stents in place. Went to OR yesterday for ex-lap with drain placement. Has been in AF with RCR. Has had progressive hypoxia and concern for aspiration, ARDS. He will undergo bronchoscopy per pulmonary service today. Continue supportive care. Bili slightly up today but AST / ALT better, AP normal. I am covering for [name] over the holiday weekend, will follow peripherally this weekend if no active GI issues. Once / if he recovers from this hospitalization will consider repeat ERCP in a few months pending his course to remove remnant stones from bile duct. Please let me know if our services are needed moving forward. Appreciate critical care / general surgery assistance in this case. Spoke with wife, answered her questions.  Ileene Patrick, MD [facility]

## 2020-03-27 NOTE — Progress Notes (Signed)
NAME:  Jorge Buscemi., MRN:  [id number], DOB:  1950/04/09, LOS: 13 ADMISSION DATE:  03/22/2020, CONSULTATION DATE:  03/07/2020 REFERRING MD:  Dr. Garald Braver, CHIEF COMPLAINT:  Respiratory failure    Brief History:  69 year old male presented originally 12/9 with complaints of abdominal pain, nausea, and vomiting, imaging concerning for choledocholithiasis underwent ERCP with stent placement later followed with cholecystectomy.  Overnight 12/23 patient developed worsening abdominal pain patient was taken for diagnostic laparoscopy with placement of intra-abdominal drain.  She remained intubated postoperatively due to respiratory compromise including questionable aspiration     Past Medical History   Past Medical History:  Diagnosis Date  . Anemia   . GERD (gastroesophageal reflux disease)     Significant Hospital Events   -ercp with cbd stent 12/10  -Cholecystectomy 12/16  -IR drain for bile leak 12/18  -ercp and stent placement 12/21 -diagnostic lap 12/23 and intra-abdominal drain, A. fib with RVR requiring diltiazem, septic shock requiring phenylephrine 12/24: More hypoxic requiring, chest x-ray showing bilateral airspace disease worrisome for evolving ARDS, placed on ARDS protocol, added fentanyl infusion.  Metabolic acidosis,, with ongoing septic shock.  Off calcium channel blocker.  Titrating phenylephrine.  Consults:  GI Surgery IR [facility] cardiology  Procedures:  Endotracheal tube 12/23  Significant Diagnostic Tests:  12/9 ct abd/pelvis: 1. Interval development of moderate intra and extrahepatic biliary dilatation with multiple rounded filling defects within the mid to distal common duct, suspicious for choledocholithiasis. Suggest correlation with LFTs, further evaluation with MRCP may be obtained. 2. Streaky hyperdense foci within the bladder, suspect that this is due to excreted contrast as opposed to bladder mass. 3. Status post partial gastrectomy with no  evidence for an obstruction. 4. Aortic atherosclerosis.  12/17 ct abd/pelvis: 1. Interval cholecystectomy. Moderate volume of perihepatic free fluid containing foci of air with non organized fluid and air in the gallbladder fossa. Given the amount of fluid, biloma is favored over postoperative seroma. Sterility is indeterminate by imaging. Consider nuclear medicine hepatic biliary scan to assess for bile leak. 2. Plastic common bile duct stent in place. Suspected residual 9 mm stone at the proximal aspect of the stent. Common bile duct dilatation of 16 mm, with improved but persistent intrahepatic biliary dilatation on the left. Pneumobilia related to recent procedure. 3. Small right and trace left pleural effusions. 4. Absent renal excretion on delayed phase imaging, can be seen with renal dysfunction. 5. Mild distal colonic diverticulosis without diverticulitis. 12/20 echo: LVEF 65-70% RV normal 12/22 ct abdomen pelvis: 1. Moderate amount of intra-abdominal free air, increased in severity when compared to the prior study. While this is likely secondary to the patient's recent surgical intervention, the presence of a perforated hollow viscus cannot completely be excluded. 2. Additional findings consistent with a postoperative ileus. 3. Right-sided surgical drain positioning, as described above, with near-total resolution of the perihepatic fluid seen on the prior study. 4. Interval distal pancreatic duct stent placement since the prior exam. 5. Moderate severity anasarca. 6. Small hiatal hernia. 7. Aortic atherosclerosis.  Micro Data:  12/10 sars2: neg 12/18 perihepatic biloma: Multiple organisms present, none predominant, no anaerobes isolated.  Antimicrobials:  Zosyn 12/17-> Vancomycin 12/23 Interim history/subjective:  Postop.  Having trouble with agitation.  Drop in pulse ox requiring high FiO2 Objective   Blood pressure (Abnormal) 95/52, pulse 74, temperature 98 F  (36.7 C), temperature source Axillary, resp. rate (Abnormal) 25, height 5' 5.75" (1.67 m), weight 69.9 kg, SpO2 91 %.    Vent Mode:  PRVC FiO2 (%):  [50 %-100 %] 90 % Set Rate:  [18 bmp-28 bmp] 28 bmp Vt Set:  [380 mL-470 mL] 380 mL PEEP:  [8 cmH20] 8 cmH20 Plateau Pressure:  [22 cmH20] 22 cmH20   Intake/Output Summary (Last 24 hours) at 03/27/2020 0757 Last data filed at 03/27/2020 0700 Gross per 24 hour  Intake 5327.7 ml  Output 1550 ml  Net 3777.7 ml   Filed Weights   March 19, 2020 1442 03/15/2020 1441  Weight: 69.9 kg 69.9 kg    Examination: General critically ill 69 year old white male currently sedated on mechanical ventilator HEENT normocephalic atraumatic orally intubated sclera nonicteric mucous membranes moist pupils equal reactive Pulmonary: Coarse scattered rhonchi diminished more on the right marked accessory use, have adjusted ventilator settings to PEEP of 14/tidal volume 6 cc/kg, still requiring 80% FiO2, overbreathing ventilator.  Plateau pressures mid 20s Cardiac: Regular irregular, atrial fibrillation with controlled ventricular rate currently Extremities: Pulses palpable, he does have bilateral lower extremity pitting edema which is about 3+.  In addition his extremities are slightly mottled and cool. Abdomen staples intact, right upper quadrant drains with bilious appearing cloudy fluid Bowel sounds are hypoactive GU: Concentrated yellow urine Neuro: Heavily sedated   Resolved Hospital Problem list     Assessment & Plan:  Acute choledocholithiasis, status post cholecystectomy complicated by bile leak, and abdominal peritonitis 12/18 requiring stenting on 12/21, and subsequent return to the OR 12/23 with washout, lysis of adhesion, and new drain placement Plan Postoperative drain management per surgical team N.p.o., currently on TNA  Severe sepsis/septic shock secondary to abdominal peritonitis -Preliminary surgical abscess culture: Multiple organisms  present/polymicrobial -Still pressor dependent on 60 mcg phenylephrine -On physical exam he still a little mottled with anion gap metabolic acidosis raising question of adequate endorgan resuscitation Plan Check lactate Transduce central venous pressure, goal CVP 8-12; he may need more volume Goal mean arterial pressure greater than 65 Wound care as mentioned above Holding antihypertensives Trend CBC Trend fever curve He is Zosyn day #7 And vancomycin day #2; length of therapy still to be determined Follow-up pending cultures  Acute Hypoxic respiratory Failure 2/2 aspiration PNA and evolving ALI/ARDS pcxr from 12/23 w/ worsening bilateral airspace disease. Left basilar vol loss, lines and tubes good position Plan Cont full vent support Change to 6cc/kg pbw; peep/FIO2 adjusted, continue to wean for pulse oximetry greater than 88% PAD protocol-->change to RASS goal -3 to facilitate Vent synchrony Continuous pulse oximetry Plateau pressure goal less than 30 He is on Zosyn day #7, stop date to be determined but given abdominal peritonitis will likely extend beyond typical coverage for pneumonia VAP bundle Assess daily for readiness to wean Repeating chest x-ray now, will get another one in the morning Arterial blood gas later this morning  Anion gap metabolic acidosis, given physical exam and pressor dependence almost certainly lactic acidosis; high risk for endorgan dysfunction and electrolyte imbalance Plan Checking lactic acid Supportive care; treat shock Serial chemistries Arterial blood gas  afib  Was in RVR; now CVR 12/24 Plan Cont tele dilt gtt off; but will keep on stand bye  Support medically.  Cont dig  IV anticoagulation started, will need to keep a close eye on CBC  Anemia without evidence of bleeding Plan Trend serial CBCs  Hyperglycemia Plan Sliding scale insulin  Best practice:  Diet: npo TNA per surg Pain/Anxiety/Delirium protocol (if indicated): per  protocol VAP protocol (if indicated): per protocol DVT prophylaxis: changed to IV heparin 12/24 GI prophylaxis: ppi Glucose control:  ssi Mobility: bedrest Code Status: FULL Family Communication: per surgery I updated wife at bedside 12/24 Disposition: ICU  Critical care time: 45 minutes

## 2020-03-27 NOTE — Progress Notes (Signed)
E-link notified that diltiazem drip has been stopped due to patient having pauses greater than 2 seconds. Will continue to monitor.

## 2020-03-27 NOTE — Progress Notes (Signed)
PHARMACY - TOTAL PARENTERAL NUTRITION CONSULT NOTE   Indication: severe malnutrition  Patient Measurements: Height: 5' 5.98" (167.6 cm) Weight: 69.9 kg (154 lb) IBW/kg (Calculated) : 63.76 TPN AdjBW (KG): 69.9 Body mass index is 24.87 kg/m. Usual Weight: 66 kg  Assessment:  69 yom who presented to the ED on 12/09 with abdominal pain and N/V. PMH for Bilroth II for bleeding gastric ulcers. He was found to have symptomatic cholelithiasis and is s/p ERCP with placement of CBD stent on 12/10 - unable to perform a sphincterotomy and stone extraction due to anatomy s/p Billroth II. He is s/p lap-chole on 12/16 which was complicated by a post-op bile leak. IR placed placed a drain on 12/18. Repeat ERCP with sphincterotomy/sphincteroplasty, stent placement and lithotripsy done on 12/21. Unable to advance diet due to nausea and hiccups and has been NPO or on clear liquids since 12/16. Pharmacy consulted for nutritional support for severe acute malnutrition.  ID: [id number] Zosyn, D1 vancomycin for sepsis related to peritonitis and asp PNA  Glucose / Insulin: no hx DM; CBGs 144-173 once TPN started, also received dexameth 10 mg 12/23, no insulin used since TPN started Electrolytes: Na low 134, all others WNL - at high risk for refeeding syndrome Renal: SCr up 1.01 LFTs / TGs: LFTs trending down, TG WNL, Tbili 4.2 Prealbumin / albumin: albumin 1.8, prealbumin 7.3 Intake / Output; MIVF: UOP 0.3 ml/kg/hr, LR at 70 ml/hr, net +13.2L. R abd drains O/P 635 ml GI Imaging: Surgeries / Procedures:  12/10 s/p ERCP with stent placement 12/16 s/p laparoscopic cholecystectomy 12/18 s/p CT guided drainage of perihepatic fluid collection and drain placement 12/21 s/p repeat ERCP 12/23 s/p diagnostic laparoscopy with placement of intra-abdominal drain  Central access: Triple lumen PICC placed 12/23 TPN start date: 03/19/2020  Nutritional Goals (per RD recommendation on 12/23): kCal: 2000-2200, Protein: 90-110, Fluid:  >=2L Goal TPN rate is 90 mL/hr (provides 90 g of protein and 2108 kcals per day)  Current Nutrition:  NPO and TPN  Plan:  Increase TPN to 50 mL/hr at 1800 - this TPN provides 50 g of protein and 1171 kcal providing ~55% of needs Electrolytes in TPN: 42mEq/L of Na, 63mEq/L of K, 36mEq/L of Ca, 56mEq/L of Mg, and 23mmol/L of Phos. Change Cl:Ac ratio to 1:2 Watch lytes closely due to risk for refeeding syndrome Add standard MVI and trace elements to TPN Initiate very Sensitive q6h SSI and adjust as needed  Reduce MIVF to 50 mL/hr at 1800 Monitor TPN labs on Mon/Thurs, labs tomorrow   Thank you for involving pharmacy in this patient's care.  Loura Back, PharmD, BCPS Clinical Pharmacist Clinical phone for 03/27/2020 until 3p is [phone] 03/27/2020 7:17 AM  **Pharmacist phone directory can be found on [email or web address] listed under [facility]**

## 2020-03-27 NOTE — Progress Notes (Signed)
Discussed care with the patient's wife and Dr. Vassie Loll PCCM.   Agree with PCCM plan for afib, IV heparin as able between procedures and IV diltiazem when needed. He is digoxin loaded, will continue daily.   Cardiology will follow as needed over the weekend.

## 2020-03-27 NOTE — Progress Notes (Signed)
ANTICOAGULATION CONSULT NOTE  Pharmacy Consult:  Heparin Indication: atrial fibrillation  Allergies  Allergen Reactions  . Other Itching  . Demerol [Meperidine] Rash    Patient Measurements: Height: 5' 5.75" (167 cm) Weight: 69.9 kg (154 lb) IBW/kg (Calculated) : 63.22 Heparin Dosing Weight: 69 kg  Vital Signs: Temp: 100 F (37.8 C) (12/24 1200) Temp Source: Axillary (12/24 1200) BP: 111/50 (12/24 1700) Pulse Rate: 98 (12/24 1510)  Labs: Recent Labs    03/25/20 0401 03/25/20 1539 03/09/2020 0216 03/30/2020 1559 03/27/20 0028 03/27/20 0500 03/27/20 0852 03/27/20 1625 03/27/20 1726  HGB 11.7*  --  10.8*   < > 11.4*  --  9.9* 10.2*  --   HCT 35.7*  --  32.6*   < > 35.6*  --  29.0* 30.0*  --   PLT 403*  --  421*  --  370  --   --   --   --   HEPARINUNFRC <0.10*   < > 0.13*  --   --  0.34  --   --  0.37  CREATININE 1.02  --  0.93  --  1.01  --   --   --   --    < > = values in this interval not displayed.    Estimated Creatinine Clearance: 60.8 mL/min (by C-G formula based on SCr of 1.01 mg/dL).    Assessment: 44 yoM admitted with abdominal pain s/p ERCP 12/10, cholecystectomy 12/16, and drain placement 12/18 and 12/23.  Patient developed AFib and pharmacy consulted for heparin dosing.  Heparin level 0.37 (therapeutic) CBC Hgb 10.2, Plt 370  Goal of Therapy:  Heparin level 0.3-0.7 units/ml Monitor platelets by anticoagulation protocol: Yes   Plan:  Continue heparin IV at 2350 units/hr Monitor daily CBC, heparin levels daily, s/sx bleeding Follow-up for longterm anticoagulation plans    Calton Dach, PharmD PGY1 Pharmacy Resident 03/27/2020 7:02 PM

## 2020-03-27 NOTE — Progress Notes (Signed)
0730: Patient was fighting the ventilator with poor volumes. He looked very uncomfortable and in distress. Cindee Lame, NP placed an order for a Fentanyl drip. O2 Sat in the 80s.   0900: Despite sedation, patient was still experiencing labored breathing and double stacking on the ventilator. He was also requiring an increased amount of vasopressor. O2 Sats continued to maintain in the 80s. MD and RT notified. Ventilator settings changed.  1000: Patient remains in distress with decreased oxygen saturation. Order given for Versed and Vec. Patient was turned onto his right side per Dr. Vassie Loll.   1030-1130: Due to there being no change in the patient's respiratory status, consent was obtained for a Bronchoscopy. Dr. Vassie Loll performed the procedure. Patient now resting comfortably with O2 Sats in the 90s.

## 2020-03-27 NOTE — Progress Notes (Signed)
eLink Physician-Brief Progress Note Patient Name: Jorge Evans. DOB: [birth year] MRN: [id number]   Date of Service  03/27/2020  HPI/Events of Note  Patient having pauses on a Cardizem infusion.  eICU Interventions  Cardizem infusion discontinued.        Thomasene Lot Arn Mcomber 03/27/2020, 6:06 AM

## 2020-03-27 NOTE — Progress Notes (Signed)
PT Cancellation Note  Patient Details Name: Jorge Evans. MRN: [id number] DOB: Nov 14, 1950   Cancelled Treatment:    Reason Eval/Treat Not Completed: Medical issues which prohibited therapy.  Status decline today.  Hold per RN. 03/27/2020  Jacinto Halim., PT Acute Rehabilitation Services [phone]  (pager) [phone]  (office)   Eliseo Gum Leya Paige 03/27/2020, 1:06 PM

## 2020-03-28 ENCOUNTER — Inpatient Hospital Stay (HOSPITAL_COMMUNITY): Payer: Medicare Other

## 2020-03-28 LAB — POCT I-STAT 7, (LYTES, BLD GAS, ICA,H+H)
Acid-Base Excess: 0 mmol/L (ref 0.0–2.0)
Bicarbonate: 25.4 mmol/L (ref 20.0–28.0)
Calcium, Ion: 0.95 mmol/L — ABNORMAL LOW (ref 1.15–1.40)
HCT: 26 % — ABNORMAL LOW (ref 39.0–52.0)
Hemoglobin: 8.8 g/dL — ABNORMAL LOW (ref 13.0–17.0)
O2 Saturation: 94 %
Potassium: 4.1 mmol/L (ref 3.5–5.1)
Sodium: 138 mmol/L (ref 135–145)
TCO2: 27 mmol/L (ref 22–32)
pCO2 arterial: 41.5 mmHg (ref 32.0–48.0)
pH, Arterial: 7.394 (ref 7.350–7.450)
pO2, Arterial: 71 mmHg — ABNORMAL LOW (ref 83.0–108.0)

## 2020-03-28 LAB — BASIC METABOLIC PANEL
Anion gap: 12 (ref 5–15)
BUN: 28 mg/dL — ABNORMAL HIGH (ref 8–23)
CO2: 21 mmol/L — ABNORMAL LOW (ref 22–32)
Calcium: 6.2 mg/dL — CL (ref 8.9–10.3)
Chloride: 103 mmol/L (ref 98–111)
Creatinine, Ser: 1.19 mg/dL (ref 0.61–1.24)
GFR, Estimated: >60 mL/min (ref >60)
Glucose, Bld: 171 mg/dL — ABNORMAL HIGH (ref 70–99)
Potassium: 4.1 mmol/L (ref 3.5–5.1)
Sodium: 136 mmol/L (ref 135–145)

## 2020-03-28 LAB — CBC
HCT: 28.9 % — ABNORMAL LOW (ref 39.0–52.0)
Hemoglobin: 9.2 g/dL — ABNORMAL LOW (ref 13.0–17.0)
MCH: 29.1 pg (ref 26.0–34.0)
MCHC: 31.8 g/dL (ref 30.0–36.0)
MCV: 91.5 fL (ref 80.0–100.0)
Platelets: 202 10*3/uL (ref 150–400)
RBC: 3.16 MIL/uL — ABNORMAL LOW (ref 4.22–5.81)
RDW: 14.5 % (ref 11.5–15.5)
WBC: 22.8 10*3/uL — ABNORMAL HIGH (ref 4.0–10.5)
nRBC: 0.1 % (ref 0.0–0.2)

## 2020-03-28 LAB — BLOOD GAS, ARTERIAL
Acid-Base Excess: 0.9 mmol/L (ref 0.0–2.0)
Bicarbonate: 25.3 mmol/L (ref 20.0–28.0)
FIO2: 90
O2 Saturation: 98.7 %
Patient temperature: 38.3
pCO2 arterial: 45.8 mmHg (ref 32.0–48.0)
pH, Arterial: 7.368 (ref 7.350–7.450)
pO2, Arterial: 141 mmHg — ABNORMAL HIGH (ref 83.0–108.0)

## 2020-03-28 LAB — GLUCOSE, CAPILLARY
Glucose-Capillary: 143 mg/dL — ABNORMAL HIGH (ref 70–99)
Glucose-Capillary: 153 mg/dL — ABNORMAL HIGH (ref 70–99)
Glucose-Capillary: 154 mg/dL — ABNORMAL HIGH (ref 70–99)
Glucose-Capillary: 160 mg/dL — ABNORMAL HIGH (ref 70–99)
Glucose-Capillary: 163 mg/dL — ABNORMAL HIGH (ref 70–99)

## 2020-03-28 LAB — HEPARIN LEVEL (UNFRACTIONATED)
Heparin Unfractionated: 0.26 IU/mL — ABNORMAL LOW (ref 0.30–0.70)
Heparin Unfractionated: 0.33 IU/mL (ref 0.30–0.70)

## 2020-03-28 LAB — MAGNESIUM: Magnesium: 1.9 mg/dL (ref 1.7–2.4)

## 2020-03-28 LAB — PHOSPHORUS: Phosphorus: 2.1 mg/dL — ABNORMAL LOW (ref 2.5–4.6)

## 2020-03-28 MED ORDER — METOCLOPRAMIDE HCL 5 MG/ML IJ SOLN: 5.0000 mg | Freq: Four times a day (QID) | INTRAMUSCULAR | Status: DC

## 2020-03-28 MED ORDER — FUROSEMIDE 10 MG/ML IJ SOLN
40.0000 mg | Freq: Once | INTRAMUSCULAR | Status: AC
  Administered 2020-03-28: 18:00:00 40 mg via INTRAVENOUS
  Filled 2020-03-28: qty 4

## 2020-03-28 MED ORDER — TRAVASOL 10 % IV SOLN
INTRAVENOUS | Status: AC
  Filled 2020-03-28: qty 705.6

## 2020-03-28 MED ORDER — CALCIUM GLUCONATE-NACL 1-0.675 GM/50ML-% IV SOLN
1.0000 g | Freq: Once | INTRAVENOUS | Status: AC
  Administered 2020-03-28: 18:00:00 1000 mg via INTRAVENOUS
  Filled 2020-03-28: qty 50

## 2020-03-28 MED ORDER — FUROSEMIDE 10 MG/ML IJ SOLN
40.0000 mg | Freq: Once | INTRAMUSCULAR | Status: AC
  Administered 2020-03-28: 13:00:00 40 mg via INTRAVENOUS
  Filled 2020-03-28: qty 4

## 2020-03-28 MED ORDER — SODIUM CHLORIDE 0.9 % IV SOLN
1.0000 g | Freq: Three times a day (TID) | INTRAVENOUS | Status: DC
  Administered 2020-03-28 – 2020-04-04 (×20): 1 g via INTRAVENOUS
  Filled 2020-03-28 (×24): qty 1

## 2020-03-28 MED ORDER — POTASSIUM PHOSPHATES 15 MMOLE/5ML IV SOLN
15.0000 mmol | Freq: Once | INTRAVENOUS | Status: AC
  Administered 2020-03-28: 16:00:00 15 mmol via INTRAVENOUS
  Filled 2020-03-28: qty 5

## 2020-03-28 MED ORDER — POTASSIUM & SODIUM PHOSPHATES 280-160-250 MG PO PACK: 2.0000 | PACK | Freq: Three times a day (TID) | ORAL | Status: DC

## 2020-03-28 MED ORDER — VANCOMYCIN HCL 750 MG/150ML IV SOLN
750.0000 mg | Freq: Two times a day (BID) | INTRAVENOUS | Status: AC
  Administered 2020-03-28 – 2020-04-02 (×11): 750 mg via INTRAVENOUS
  Filled 2020-03-28 (×11): qty 150

## 2020-03-28 NOTE — Progress Notes (Signed)
CRITICAL VALUE ALERT  Critical Value: Ca 6.2  Date & Time Notified:  03/28/2020 @ 0829  Provider Notified: Marilynne Halsted, MD at bedside and notified  Orders Received/Actions taken: awaiting orders  Aris Lot, RN

## 2020-03-28 NOTE — Progress Notes (Addendum)
Spiking further fever: 103 Got tylenol; adding vanc. And change to meropenem as wbc cont to rise.   Simonne Martinet ACNP-BC [facility] Pulmonary/Critical Care Pager # [phone] OR # [phone] if no answer

## 2020-03-28 NOTE — Progress Notes (Signed)
RT NOTE:  Advanced ETT from 21 cm to 23 cm due to cuff leak. AM xray reported ETT 5 cm above carina. Leak resolved with advancement.

## 2020-03-28 NOTE — Progress Notes (Signed)
Pharmacy Antibiotic Note  Jorge Evans. is a 69 y.o. male admitted on 03/06/2020 s/p cholecystectomy + stent placement with drain, also aspiration pna, currently on zosyn and now with new fevers CCM to broaden coverage for now.  Pharmacy has been consulted for vancomycin and Merrem dosing.  Received vancomycin 1750 mg x 1 12/24  Plan: Vancomycin 750 mg IV q12h Merrem 1g IV q8h Monitor renal function, Cx and clinical progression to narrow Vancomycin trough at steady state  Height: 5' 5.75" (167 cm) Weight: 69.9 kg (154 lb) IBW/kg (Calculated) : 63.22  Temp (24hrs), Avg:100.6 F (38.1 C), Min:98.7 F (37.1 C), Max:103.2 F (39.6 C)  Recent Labs  Lab 03/23/20 0232 04/01/2020 0528 03/25/20 0401 03/24/2020 0216 03/16/2020 0752 03/27/20 0028 03/27/20 0859 03/28/20 0512  WBC 12.0* 15.2* 16.3* 18.7*  --  17.4*  --  22.8*  CREATININE 0.93  --  1.02 0.93  --  1.01  --  1.19  LATICACIDVEN  --   --   --   --  1.3  --  3.0*  --     Estimated Creatinine Clearance: 51.6 mL/min (by C-G formula based on SCr of 1.19 mg/dL).    Allergies  Allergen Reactions  . Other Itching  . Demerol [Meperidine] Rash    Zosyn 12/18>>12/25 Vanc 12/23>>12/24; 12/25>> Merrem 12/25>>  12/24 bcx ngtd 12/19 mrsa neg  12/18 perihep abscess- multiple orgs  Daylene Posey, PharmD Clinical Pharmacist ED Pharmacist Phone # [phone] 03/28/2020 6:06 PM

## 2020-03-28 NOTE — Progress Notes (Addendum)
ANTICOAGULATION CONSULT NOTE  Pharmacy Consult:  Heparin Indication: atrial fibrillation  Allergies  Allergen Reactions  . Other Itching  . Demerol [Meperidine] Rash    Patient Measurements: Height: 5' 5.75" (167 cm) Weight: 69.9 kg (154 lb) IBW/kg (Calculated) : 63.22 Heparin Dosing Weight: 69 kg  Vital Signs: Temp: 101.5 F (38.6 C) (12/25 1200) Temp Source: Axillary (12/25 1200) BP: 151/22 (12/25 1300) Pulse Rate: 112 (12/25 1300)  Labs: Recent Labs    03/29/2020 0216 03/18/2020 1559 03/27/20 0028 03/27/20 0500 03/27/20 1625 03/27/20 1726 03/27/20 2005 03/28/20 0512 03/28/20 1345  HGB 10.8*   < > 11.4*   < > 10.2*  --  9.9* 9.2*  --   HCT 32.6*   < > 35.6*   < > 30.0*  --  29.0* 28.9*  --   PLT 421*  --  370  --   --   --   --  202  --   HEPARINUNFRC 0.13*  --   --    < >  --  0.37  --  0.26* 0.33  CREATININE 0.93  --  1.01  --   --   --   --  1.19  --    < > = values in this interval not displayed.    Estimated Creatinine Clearance: 51.6 mL/min (by C-G formula based on SCr of 1.19 mg/dL).    Assessment: 68 yoM admitted with abdominal pain s/p ERCP 12/10, cholecystectomy 12/16, and drain placement 12/18 and 12/23.  Patient developed AFib and pharmacy consulted for heparin dosing.  Heparin level 0.33 at goal on high rate of 2500 units/hr.  H/H dropped 12/24 postop but stable since, plt stable postop. Visually confirmed that heparin is running at 2500 units/hr, no signs of bleeding. HR still >100s. Will increase heparin slightly to keep in goal. Discussed with RN.  Goal of Therapy:  Heparin level 0.3-0.7 units/ml Monitor platelets by anticoagulation protocol: Yes   Plan:  Increase heparin IV to 2550 units/hr Monitor daily CBC, heparin levels daily, s/sx bleeding Follow-up for longterm anticoagulation plans   Alphia Moh, PharmD, BCPS, [facility] Clinical Pharmacist  Please check AMION for all [facility] Pharmacy phone numbers After 10:00 PM, call Main Pharmacy  [phone]

## 2020-03-28 NOTE — Progress Notes (Signed)
ANTICOAGULATION CONSULT NOTE - Follow Up Consult  Pharmacy Consult for heparin Indication: atrial fibrillation  Labs: Recent Labs    03/23/2020 0216 03/05/2020 1559 03/27/20 0028 03/27/20 0500 03/27/20 0852 03/27/20 1625 03/27/20 1726 03/27/20 2005 03/28/20 0512  HGB 10.8*   < > 11.4*  --    < > 10.2*  --  9.9* 9.2*  HCT 32.6*   < > 35.6*  --    < > 30.0*  --  29.0* 28.9*  PLT 421*  --  370  --   --   --   --   --  202  HEPARINUNFRC 0.13*  --   --  0.34  --   --  0.37  --  0.26*  CREATININE 0.93  --  1.01  --   --   --   --   --   --    < > = values in this interval not displayed.    Assessment: 69yo male subtherapeutic on heparin after two levels at lower end of goal; no gtt issues or signs of bleeding per RN.  Goal of Therapy:  Heparin level 0.3-0.7 units/ml   Plan:  Will increase heparin gtt by 2 units/kg/hr to 2500 units/hr and check level in 6 hours.    Vernard Gambles, PharmD, BCPS  03/28/2020,6:18 AM

## 2020-03-28 NOTE — Progress Notes (Signed)
ABG results given to Dr. Vassie Loll. Verbal order received to place pt on 6cc/kg Vt and to repeat ABG in 1-2 hours. RN made aware of changes. RT will continue to monitor and be available as needed.

## 2020-03-28 NOTE — Progress Notes (Addendum)
PHARMACY - TOTAL PARENTERAL NUTRITION CONSULT NOTE   Indication: severe malnutrition, anticipated ileus  Patient Measurements: Height: 5' 5.75" (167 cm) Weight: 69.9 kg (154 lb) IBW/kg (Calculated) : 63.22 TPN AdjBW (KG): 69.9 Body mass index is 25.05 kg/m. Usual Weight: 66 kg  Assessment:  69 yom who presented to the ED on 12/09 with abdominal pain and N/V. PMH for Bilroth II for bleeding gastric ulcers. He was found to have symptomatic cholelithiasis and is s/p ERCP with placement of CBD stent on 12/10 - unable to perform a sphincterotomy and stone extraction due to anatomy s/p Billroth II. He is s/p lap-chole on 12/16 which was complicated by a post-op bile leak. IR placed placed a drain on 12/18. Repeat ERCP with sphincterotomy/sphincteroplasty, stent placement and lithotripsy done on 12/21. Unable to advance diet due to nausea and hiccups and has been NPO or on clear liquids since 12/16. Pharmacy consulted for nutritional support for severe acute malnutrition and anticipated ileus.  ID:  Zosyn (12/18>>  ) for sepsis related to peritonitis and asp PNA  Glucose / Insulin: no hx DM; CBGs 151-203 last 24hr on TPN, used 4 units insulin/24 hr Electrolytes: Phos 2.1, mg 1.9; Coca 7.9, all others WNL - at high risk for refeeding syndrome Renal: SCr up 1.19, BUN 28  LFTs / TGs: LFTs trending down, TG 108, Tbili 4.2 Prealbumin / albumin: albumin 1.8, prealbumin 7.3 Intake / Output; MIVF: UOP 0.4 ml/kg/hr, bicarb at 75 ml/hr, net +18.7L. R abd drains O/P GI Imaging: Surgeries / Procedures:  12/10 s/p ERCP with stent placement 12/16 s/p laparoscopic cholecystectomy 12/18 s/p CT guided drainage of perihepatic fluid collection and drain placement 12/21 s/p repeat ERCP 12/23 s/p diagnostic laparoscopy with placement of intra-abdominal drain  Central access: Triple lumen PICC placed 12/23 TPN start date: 03/19/2020  Nutritional Goals (per RD recommendation on 12/23): kCal: 2000-2200,  Protein: 90-110, Fluid: >=2L Goal TPN rate is 90 mL/hr (provides 90 g of protein and 2108 kcals per day)  Current Nutrition:  NPO and TPN  Plan:  -Increase TPN to 70 mL/hr at 1800 - provides 70 g of protein, 27g lipid, 319g dextrose, and 1639 kcal providing ~77% of needs -Electrolytes in TPN: increase to 167mEq/L of Na, 69mEq/L of Ca, 30mEq/L of Mg; decrease to 74mEq/L of K; continue 76mmol/L of Phos. Change to max acetate  -Watch lytes closely due to risk for refeeding syndrome -Add standard MVI and trace elements to TPN -Continue very Sensitive q6h SSI and adjust as needed  -Stop Bicarb drip after discussion with team -Na phos on backorder, per RN pt bowel's not moving so do not want to give phosphate per tube> ordered IV Kphos (22 mEq of K) and phos increased in TPN  -Monitor TPN labs    Thank you for involving pharmacy in this patient's care.  Alphia Moh, PharmD, BCPS, BCCP Clinical Pharmacist  Please check AMION for all [facility] Pharmacy phone numbers After 10:00 PM, call Main Pharmacy [phone]

## 2020-03-28 NOTE — Progress Notes (Addendum)
General Surgery Follow Up Note  Subjective:    Overnight Issues:  Had some hypotension which resolved and pt weaned off neosynephrine. Acidotic on ABG.    Objective:  Vital signs for last 24 hours: Temp:  [98.7 F (37.1 C)-100.2 F (37.9 C)] 99.3 F (37.4 C) (12/25 0400) Pulse Rate:  [74-120] 105 (12/25 0700) Resp:  [20-43] 33 (12/25 0700) BP: (68-162)/(29-88) 136/42 (12/25 0700) SpO2:  [86 %-100 %] 97 % (12/25 0804) FiO2 (%):  [60 %-100 %] 90 % (12/25 0804)  Hemodynamic parameters for last 24 hours: CVP:  [1 mmHg-21 mmHg] 16 mmHg  Intake/Output from previous day: 12/24 0701 - 12/25 0700 In: 8195.8 [I.V.:7597.8; NG/GT:50; IV Piggyback:548] Out: 1160 [Urine:645; Drains:515]  Intake/Output this shift: No intake/output data recorded.  Vent settings for last 24 hours: Vent Mode: PRVC FiO2 (%):  [60 %-100 %] 90 % Set Rate:  [28 bmp-32 bmp] 32 bmp Vt Set:  [380 mL-420 mL] 420 mL PEEP:  [14 cmH20-16 cmH20] 16 cmH20 Plateau Pressure:  [22 cmH20-30 cmH20] 26 cmH20  Physical Exam:  Gen: comfortable, no distress Neuro: intubated, sedated.  Too sedated to follow commands.   HEENT: intubated Neck: supple CV: irreg irreg Pulm: unlabored breathing, mechanically ventilated, decreased at bases. Abd: soft, nontender, incisions c/d/i with dermabond, surgical drain murky brown.  Absent bowel sounds GU: clear, yellow urine Extr: wwp, no edema   Results for orders placed or performed during the hospital encounter of 03/15/2020 (from the past 24 hour(s))  I-STAT 7, (LYTES, BLD GAS, ICA, H+H)     Status: Abnormal   Collection Time: 03/27/20  8:52 AM  Result Value Ref Range   pH, Arterial 7.349 (L) 7.350 - 7.450   pCO2 arterial 36.8 32.0 - 48.0 mmHg   pO2, Arterial 68 (L) 83.0 - 108.0 mmHg   Bicarbonate 20.4 20.0 - 28.0 mmol/L   TCO2 21 (L) 22 - 32 mmol/L   O2 Saturation 93.0 %   Acid-base deficit 5.0 (H) 0.0 - 2.0 mmol/L   Sodium 135 135 - 145 mmol/L   Potassium 4.3 3.5 - 5.1  mmol/L   Calcium, Ion 0.99 (L) 1.15 - 1.40 mmol/L   HCT 29.0 (L) 39.0 - 52.0 %   Hemoglobin 9.9 (L) 13.0 - 17.0 g/dL   Patient temperature 63.8 F    Collection site Web designer by Operator    Sample type ARTERIAL   Lactic acid, plasma     Status: Abnormal   Collection Time: 03/27/20  8:59 AM  Result Value Ref Range   Lactic Acid, Venous 3.0 (HH) 0.5 - 1.9 mmol/L  Glucose, capillary     Status: Abnormal   Collection Time: 03/27/20 12:25 PM  Result Value Ref Range   Glucose-Capillary 203 (H) 70 - 99 mg/dL  Glucose, capillary     Status: Abnormal   Collection Time: 03/27/20  4:16 PM  Result Value Ref Range   Glucose-Capillary 151 (H) 70 - 99 mg/dL  I-STAT 7, (LYTES, BLD GAS, ICA, H+H)     Status: Abnormal   Collection Time: 03/27/20  4:25 PM  Result Value Ref Range   pH, Arterial 7.138 (LL) 7.350 - 7.450   pCO2 arterial 62.0 (H) 32.0 - 48.0 mmHg   pO2, Arterial 64 (L) 83.0 - 108.0 mmHg   Bicarbonate 20.8 20.0 - 28.0 mmol/L   TCO2 23 22 - 32 mmol/L   O2 Saturation 82.0 %   Acid-base deficit 8.0 (H) 0.0 - 2.0 mmol/L  Sodium 137 135 - 145 mmol/L   Potassium 4.7 3.5 - 5.1 mmol/L   Calcium, Ion 0.95 (L) 1.15 - 1.40 mmol/L   HCT 30.0 (L) 39.0 - 52.0 %   Hemoglobin 10.2 (L) 13.0 - 17.0 g/dL   Patient temperature 161.0 F    Collection site Web designer by Operator    Sample type ARTERIAL    Comment NOTIFIED PHYSICIAN   Heparin level (unfractionated)     Status: None   Collection Time: 03/27/20  5:26 PM  Result Value Ref Range   Heparin Unfractionated 0.37 0.30 - 0.70 IU/mL  I-STAT 7, (LYTES, BLD GAS, ICA, H+H)     Status: Abnormal   Collection Time: 03/27/20  8:05 PM  Result Value Ref Range   pH, Arterial 7.299 (L) 7.350 - 7.450   pCO2 arterial 44.8 32.0 - 48.0 mmHg   pO2, Arterial 64 (L) 83.0 - 108.0 mmHg   Bicarbonate 21.8 20.0 - 28.0 mmol/L   TCO2 23 22 - 32 mmol/L   O2 Saturation 88.0 %   Acid-base deficit 4.0 (H) 0.0 - 2.0 mmol/L   Sodium 135 135 - 145  mmol/L   Potassium 4.7 3.5 - 5.1 mmol/L   Calcium, Ion 0.99 (L) 1.15 - 1.40 mmol/L   HCT 29.0 (L) 39.0 - 52.0 %   Hemoglobin 9.9 (L) 13.0 - 17.0 g/dL   Patient temperature 960.4 F    Collection site art line    Drawn by RT    Sample type ARTERIAL   Glucose, capillary     Status: Abnormal   Collection Time: 03/28/20 12:11 AM  Result Value Ref Range   Glucose-Capillary 153 (H) 70 - 99 mg/dL  Heparin level (unfractionated)     Status: Abnormal   Collection Time: 03/28/20  5:12 AM  Result Value Ref Range   Heparin Unfractionated 0.26 (L) 0.30 - 0.70 IU/mL  Basic metabolic panel     Status: Abnormal   Collection Time: 03/28/20  5:12 AM  Result Value Ref Range   Sodium 136 135 - 145 mmol/L   Potassium 4.1 3.5 - 5.1 mmol/L   Chloride 103 98 - 111 mmol/L   CO2 21 (L) 22 - 32 mmol/L   Glucose, Bld 171 (H) 70 - 99 mg/dL   BUN 28 (H) 8 - 23 mg/dL   Creatinine, Ser 5.40 0.61 - 1.24 mg/dL   Calcium 6.2 (LL) 8.9 - 10.3 mg/dL   GFR, Estimated >98 >11 mL/min   Anion gap 12 5 - 15  Phosphorus     Status: Abnormal   Collection Time: 03/28/20  5:12 AM  Result Value Ref Range   Phosphorus 2.1 (L) 2.5 - 4.6 mg/dL  Magnesium     Status: None   Collection Time: 03/28/20  5:12 AM  Result Value Ref Range   Magnesium 1.9 1.7 - 2.4 mg/dL  CBC     Status: Abnormal   Collection Time: 03/28/20  5:12 AM  Result Value Ref Range   WBC 22.8 (H) 4.0 - 10.5 K/uL   RBC 3.16 (L) 4.22 - 5.81 MIL/uL   Hemoglobin 9.2 (L) 13.0 - 17.0 g/dL   HCT 91.4 (L) 78.2 - 95.6 %   MCV 91.5 80.0 - 100.0 fL   MCH 29.1 26.0 - 34.0 pg   MCHC 31.8 30.0 - 36.0 g/dL   RDW 21.3 08.6 - 57.8 %   Platelets 202 150 - 400 K/uL   nRBC 0.1 0.0 - 0.2 %  Glucose, capillary     Status: Abnormal   Collection Time: 03/28/20  5:26 AM  Result Value Ref Range   Glucose-Capillary 143 (H) 70 - 99 mg/dL    Assessment & Plan: The plan of care was discussed with the bedside nurse for the day, who is in agreement with this plan and no  additional concerns were raised.   Present on Admission: . Choledocholithiasis . GERD (gastroesophageal reflux disease) . Iron deficiency anemia    LOS: 15 days   Additional comments:I reviewed the patient's new clinical lab test results.   and I reviewed the patients new imaging test results.    GERD IDA PMH Bilroth II for bleeding gastric ulcers ABL anemia - relatively stable.   A. Fib with RVR - on heparin and diltiazem gtts  Symptomatic cholelithiasis Choledocholithiasis without evidence of acute cholecystitis  S/p ERCP, placement of CBD stent 12/10 [name] -unable to perform a sphincterotomy and stone extraction due to anatomy s/p Billroth II S/plaparoscopic cholecystectomy12/16 Dr. Janee Morn Postoperative bile leak             - POD#9/2             - CT scan 12/17 revealed moderate volumeperihepatic free fluid containing foci of air with non organized fluid and air in the gallbladder fossaconcerning for biloma             - s/p IR drain placement 12/18 with yield high volume bilious fluid, culture pending             -s/p ERCP with sphincterotomy/sphincteroplasty, stent placement and lithotripsy 12/21 by Dr. Meridee Score             - Tbili 4.2 yesterday, recheck in AM.               - drain output remains murky and was 515 total  - OR for laparoscopic exploration due to increased free air on Ct 12/23, washout and drain placement 12/23 by Dr. Corliss Skains, had new drain placed in perihepatic region which was undrained by IR drain.    ID: zosyn 12/18>>at least 12/27, aspirated on induction in OR 12/23, monitor clinically, already on zosyn FEN:IVF,TPN, anticipated ileus, but can trial trickle TF when passing flatus/BM (not yet) VTE: SCDs,heparin gtt   Daughter at bedside.  Deferred vent/sedation/pneumonia questions to PCCM   Maudry Diego, MD FACS Surgical Oncology, General Surgery, Trauma and Critical [facility] Surgery, [place] [phone] for weekday/non  holidays Check [email or web address] for coverage night/weekend/holidays  Do not use SecureChat as it is not reliable for timely patient care.     03/28/2020  *Care during the described time interval was provided by me. I have reviewed this patient's available data, including medical history, events of note, physical examination and test results as part of my evaluation.

## 2020-03-28 NOTE — Progress Notes (Addendum)
NAME:  Jorge Evans., MRN:  [id number], DOB:  1950-07-29, LOS: 13 ADMISSION DATE:  04/01/2020, CONSULTATION DATE:  03/05/2020 REFERRING MD:  Dr. Garald Braver, CHIEF COMPLAINT:  Respiratory failure    Brief History:  69 year old male presented originally 12/9 with complaints of abdominal pain, nausea, and vomiting, imaging concerning for choledocholithiasis underwent ERCP with stent placement later followed with cholecystectomy.  Overnight 12/23 patient developed worsening abdominal pain patient was taken for diagnostic laparoscopy with placement of intra-abdominal drain.  She remained intubated postoperatively due to respiratory compromise including questionable aspiration    Past Medical History   Past Medical History:  Diagnosis Date  . Anemia   . GERD (gastroesophageal reflux disease)     Significant Hospital Events   -ercp with cbd stent 12/10  -Cholecystectomy 12/16  -IR drain for bile leak 12/18  -ercp and stent placement 12/21 -diagnostic lap 12/23 and intra-abdominal drain, A. fib with RVR requiring diltiazem, septic shock requiring phenylephrine 12/24: More hypoxic requiring, chest x-ray showing bilateral airspace disease worrisome for evolving ARDS, placed on ARDS protocol, added fentanyl infusion.  Metabolic acidosis,, with ongoing septic shock.  Off calcium channel blocker.  Titrating phenylephrine.  Additional volume resuscitation.  Had bronchoscopy.,  Had significant clear bilious aspirate which was lavaged.  Placed on bicarbonate drip for ongoing acidosis 12/25: Acid-base improved.  CVP now 23, off pressors.  One-time Lasix.  Trying to minimize fluid administration.  Still on high PEEP/FiO2  Consults:  GI Surgery IR Ccm [facility] cardiology  Procedures:  Endotracheal tube 12/23  Significant Diagnostic Tests:  12/9 ct abd/pelvis: 1. Interval development of moderate intra and extrahepatic biliary dilatation with multiple rounded filling defects within the mid  to distal common duct, suspicious for choledocholithiasis. Suggest correlation with LFTs, further evaluation with MRCP may be obtained. 2. Streaky hyperdense foci within the bladder, suspect that this is due to excreted contrast as opposed to bladder mass. 3. Status post partial gastrectomy with no evidence for an obstruction. 4. Aortic atherosclerosis.  12/17 ct abd/pelvis: 1. Interval cholecystectomy. Moderate volume of perihepatic free fluid containing foci of air with non organized fluid and air in the gallbladder fossa. Given the amount of fluid, biloma is favored over postoperative seroma. Sterility is indeterminate by imaging. Consider nuclear medicine hepatic biliary scan to assess for bile leak. 2. Plastic common bile duct stent in place. Suspected residual 9 mm stone at the proximal aspect of the stent. Common bile duct dilatation of 16 mm, with improved but persistent intrahepatic biliary dilatation on the left. Pneumobilia related to recent procedure. 3. Small right and trace left pleural effusions. 4. Absent renal excretion on delayed phase imaging, can be seen with renal dysfunction. 5. Mild distal colonic diverticulosis without diverticulitis. 12/20 echo: LVEF 65-70% RV normal 12/22 ct abdomen pelvis: 1. Moderate amount of intra-abdominal free air, increased in severity when compared to the prior study. While this is likely secondary to the patient's recent surgical intervention, the presence of a perforated hollow viscus cannot completely be excluded. 2. Additional findings consistent with a postoperative ileus. 3. Right-sided surgical drain positioning, as described above, with near-total resolution of the perihepatic fluid seen on the prior study. 4. Interval distal pancreatic duct stent placement since the prior exam. 5. Moderate severity anasarca. 6. Small hiatal hernia. 7. Aortic atherosclerosis.  Micro Data:  12/10 sars2: neg 12/18 perihepatic biloma:  Multiple organisms present, none predominant, no anaerobes isolated.  Antimicrobials:  Zosyn 12/17-> Vancomycin 12/23 Interim history/subjective:  Off pressors but still on  very high FiO2 and PEEP Objective   Blood pressure (Abnormal) 127/41, pulse (Abnormal) 118, temperature 99.5 F (37.5 C), temperature source Axillary, resp. rate (Abnormal) 32, height 5' 5.75" (1.67 m), weight 69.9 kg, SpO2 95 %. CVP:  [1 mmHg-23 mmHg] 23 mmHg  Vent Mode: PRVC FiO2 (%):  [60 %-90 %] 90 % Set Rate:  [28 bmp-32 bmp] 32 bmp Vt Set:  [380 mL-420 mL] 420 mL PEEP:  [16 cmH20] 16 cmH20 Plateau Pressure:  [22 cmH20-30 cmH20] 26 cmH20   Intake/Output Summary (Last 24 hours) at 03/28/2020 1145 Last data filed at 03/28/2020 0700 Gross per 24 hour  Intake 6730.82 ml  Output 840 ml  Net 5890.82 ml   Filed Weights   03/08/2020 1442 03/04/2020 1441  Weight: 69.9 kg 69.9 kg    Examination:  General 69 year old white male sedated heavily on the vent HEENT normocephalic orally intubated Pulmonary: Scattered rhonchi, current plateau pressures 26, currently on PEEP 16/FiO2 90.  Saturations mid 90s Cardiac: Continue routine Regular irregular Abdomen: Distended, staples intact, right upper quadrant drains with bilious cloudy appearing fluid.  Bowel sounds hypoactive. GU: Cloudy urine Neuro: Heavily sedated.  Resolved Hospital Problem list     Assessment & Plan:  Acute choledocholithiasis, status post cholecystectomy complicated by bile leak, and abdominal peritonitis 12/18 requiring stenting on 12/21, and subsequent return to the OR 12/23 with washout, lysis of adhesion, and new drain placement Plan Drain management per gen surg TPN per surg  NPO  Severe sepsis/septic shock secondary to abdominal peritonitis-->shock resolved as of 12/25. Looks a little volume overloaded -Preliminary surgical abscess culture: Multiple organisms present/polymicrobial Plan Cont wound care Day 8 zosyn and day 3 vanc length  or therapy TBD Await pending cultures Neo on standby   Acute Hypoxic respiratory Failure 2/2 aspiration PNA and evolving ALI/ARDS PCXR endotracheal tube in place.  Worsening bilateral right greater than left airspace  disease still requiring very high  PEEP/FiO2 Plateau pressure currently 26 Plan Continue 6 mL/kg predicted body weight ventilation  Continue to titrate PEEP/FiO2  PAD protocol RASS goal -3  Plateau pressure goal less than 30  Pulse oximetry goal greater than 88  Lasix x1 today  Limit IVFs He is on Zosyn day #8  VAP bundle  Chest x-ray a.m.  Arterial blood gas a.m.   Worsening lactic acidosis with anion gap metabolic acidosis. Anion gap now resolved, bicarbonate normalized Plan Discontinue bicarbonate drip  afib  Was in RVR; now CVR 12/24 Plan Continue telemetry monitoring  Continue digoxin  IV heparin    Anemia without evidence of bleeding Plan Continue to trend  Hyperglycemia Plan Sliding scale insulin  Best practice:  Diet: npo TNA per surg Pain/Anxiety/Delirium protocol (if indicated): per protocol VAP protocol (if indicated): per protocol DVT prophylaxis: changed to IV heparin 12/24 GI prophylaxis: ppi Glucose control: ssi Mobility: bedrest Code Status: FULL Family Communication: per surgery I updated wife at bedside 12/24 Disposition: ICU  Critical care time: 32 minutes minutes  Simonne Martinet ACNP-BC [facility] Pulmonary/Critical Care Pager # [phone] OR # [phone] if no answer

## 2020-03-29 ENCOUNTER — Inpatient Hospital Stay (HOSPITAL_COMMUNITY): Payer: Medicare Other

## 2020-03-29 DIAGNOSIS — K838 Other specified diseases of biliary tract: Secondary | ICD-10-CM

## 2020-03-29 LAB — POCT I-STAT 7, (LYTES, BLD GAS, ICA,H+H)
Acid-Base Excess: 5 mmol/L — ABNORMAL HIGH (ref 0.0–2.0)
Bicarbonate: 29.8 mmol/L — ABNORMAL HIGH (ref 20.0–28.0)
Calcium, Ion: 1.03 mmol/L — ABNORMAL LOW (ref 1.15–1.40)
HCT: 25 % — ABNORMAL LOW (ref 39.0–52.0)
Hemoglobin: 8.5 g/dL — ABNORMAL LOW (ref 13.0–17.0)
O2 Saturation: 96 %
Patient temperature: 100.4
Potassium: 4.2 mmol/L (ref 3.5–5.1)
Sodium: 141 mmol/L (ref 135–145)
TCO2: 31 mmol/L (ref 22–32)
pCO2 arterial: 46 mmHg (ref 32.0–48.0)
pH, Arterial: 7.424 (ref 7.350–7.450)
pO2, Arterial: 82 mmHg — ABNORMAL LOW (ref 83.0–108.0)

## 2020-03-29 LAB — COMPREHENSIVE METABOLIC PANEL
ALT: 187 U/L — ABNORMAL HIGH (ref 0–44)
AST: 182 U/L — ABNORMAL HIGH (ref 15–41)
Albumin: 1.1 g/dL — ABNORMAL LOW (ref 3.5–5.0)
Alkaline Phosphatase: 76 U/L (ref 38–126)
Anion gap: 8 (ref 5–15)
BUN: 35 mg/dL — ABNORMAL HIGH (ref 8–23)
CO2: 26 mmol/L (ref 22–32)
Calcium: 6.3 mg/dL — CL (ref 8.9–10.3)
Chloride: 104 mmol/L (ref 98–111)
Creatinine, Ser: 1.13 mg/dL (ref 0.61–1.24)
GFR, Estimated: >60 mL/min (ref >60)
Glucose, Bld: 178 mg/dL — ABNORMAL HIGH (ref 70–99)
Potassium: 4 mmol/L (ref 3.5–5.1)
Sodium: 138 mmol/L (ref 135–145)
Total Bilirubin: 1.8 mg/dL — ABNORMAL HIGH (ref 0.3–1.2)
Total Protein: 3.9 g/dL — ABNORMAL LOW (ref 6.5–8.1)

## 2020-03-29 LAB — CBC
HCT: 26.8 % — ABNORMAL LOW (ref 39.0–52.0)
Hemoglobin: 8.5 g/dL — ABNORMAL LOW (ref 13.0–17.0)
MCH: 29 pg (ref 26.0–34.0)
MCHC: 31.7 g/dL (ref 30.0–36.0)
MCV: 91.5 fL (ref 80.0–100.0)
Platelets: 120 10*3/uL — ABNORMAL LOW (ref 150–400)
RBC: 2.93 MIL/uL — ABNORMAL LOW (ref 4.22–5.81)
RDW: 14.5 % (ref 11.5–15.5)
WBC: 12.5 10*3/uL — ABNORMAL HIGH (ref 4.0–10.5)
nRBC: 0.2 % (ref 0.0–0.2)

## 2020-03-29 LAB — MAGNESIUM: Magnesium: 2.2 mg/dL (ref 1.7–2.4)

## 2020-03-29 LAB — HEPARIN LEVEL (UNFRACTIONATED): Heparin Unfractionated: 0.47 IU/mL (ref 0.30–0.70)

## 2020-03-29 LAB — PHOSPHORUS: Phosphorus: 2.6 mg/dL (ref 2.5–4.6)

## 2020-03-29 LAB — GLUCOSE, CAPILLARY
Glucose-Capillary: 149 mg/dL — ABNORMAL HIGH (ref 70–99)
Glucose-Capillary: 167 mg/dL — ABNORMAL HIGH (ref 70–99)
Glucose-Capillary: 194 mg/dL — ABNORMAL HIGH (ref 70–99)

## 2020-03-29 MED ORDER — FUROSEMIDE 10 MG/ML IJ SOLN
40.0000 mg | Freq: Three times a day (TID) | INTRAMUSCULAR | Status: DC
  Administered 2020-03-29 – 2020-03-30 (×3): 40 mg via INTRAVENOUS
  Filled 2020-03-29 (×3): qty 4

## 2020-03-29 MED ORDER — FUROSEMIDE 10 MG/ML IJ SOLN: 40.0000 mg | Freq: Three times a day (TID) | INTRAMUSCULAR | Status: DC

## 2020-03-29 MED ORDER — CALCIUM GLUCONATE-NACL 1-0.675 GM/50ML-% IV SOLN
1.0000 g | Freq: Once | INTRAVENOUS | Status: AC
  Administered 2020-03-29: 08:00:00 1000 mg via INTRAVENOUS
  Filled 2020-03-29: qty 50

## 2020-03-29 MED ORDER — FUROSEMIDE 10 MG/ML IJ SOLN: 40.0000 mg | Freq: Two times a day (BID) | INTRAMUSCULAR | Status: DC

## 2020-03-29 MED ORDER — TRAVASOL 10 % IV SOLN
INTRAVENOUS | Status: AC
  Filled 2020-03-29: qty 900

## 2020-03-29 NOTE — Progress Notes (Signed)
CRITICAL VALUE ALERT  Critical Value:  Calcium 6.3  Date & Time Notied:  03/29/2020 0558  Provider Notified: E-Link RN notified  Orders Received/Actions taken: No new orders at this time. Will continue to monitor.

## 2020-03-29 NOTE — Progress Notes (Signed)
PHARMACY - TOTAL PARENTERAL NUTRITION CONSULT NOTE   Indication: severe malnutrition, anticipated ileus  Patient Measurements: Height: 5' 5.75" (167 cm) Weight: 69.9 kg (154 lb) IBW/kg (Calculated) : 63.22 TPN AdjBW (KG): 69.9 Body mass index is 25.05 kg/m. Usual Weight: 66 kg  Assessment:  69 yom who presented to the ED on 12/09 with abdominal pain and N/V. PMH for Bilroth II for bleeding gastric ulcers. He was found to have symptomatic cholelithiasis and is s/p ERCP with placement of CBD stent on 12/10 - unable to perform a sphincterotomy and stone extraction due to anatomy s/p Billroth II. He is s/p lap-chole on 12/16 which was complicated by a post-op bile leak. IR placed placed a drain on 12/18. Repeat ERCP with sphincterotomy/sphincteroplasty, stent placement and lithotripsy done on 12/21. Unable to advance diet due to nausea and hiccups and has been NPO or on clear liquids since 12/16. Pharmacy consulted for nutritional support for severe acute malnutrition and anticipated ileus.   Glucose / Insulin: no hx DM; CBGs <180 last 24hr on TPN, used 3 units insulin/24 hr Electrolytes: Phos 2.6, Mg 2.2; CoCa 8.62 - 1 g Ca gluc ordered, all others WNL - at high risk for refeeding syndrome Renal: SCr 1.13, BUN 35 LFTs / TGs: AST/ALT up 182/187, TG 108, Tbili down 1.8 Prealbumin / albumin: albumin 1.1, prealbumin 7.3 Intake / Output; MIVF: UOP 1.3 ml/kg/hr (s/p Lasix 40 IV x2 12/25), net +20.9L. R abd drains O/P GI Imaging: Surgeries / Procedures:  12/10 s/p ERCP with stent placement 12/16 s/p laparoscopic cholecystectomy 12/18 s/p CT guided drainage of perihepatic fluid collection and drain placement 12/21 s/p repeat ERCP 12/23 s/p diagnostic laparoscopy with placement of intra-abdominal drain  Central access: Triple lumen PICC placed 12/23 TPN start date: 03/28/2020  Nutritional Goals (per RD recommendation on 12/23): kCal: 2000-2200, Protein: 90-110, Fluid: >=2L Goal TPN rate  is 75 mL/hr (provides 90 g of protein and 2012 kcals per day) - minimizing fluid, TPN adjusted for lower volume on 12/26  Current Nutrition:  NPO and TPN  Plan:  -Increase TPN to new goal rate of 75 mL/hr at 1800  - provides 90 g of protein, 31 g lipid, 396 g dextrose, and 2012 kcal providing 100% of needs -Electrolytes in TPN: 61mEq/L of Na, incr 79mEq/L of Ca, 41mEq/L of Mg; 68mEq/L of K; continue 70mmol/L of Phos. Continue max acetate. Total lytes about same as 12/25 exc incr in Ca  -Watch lytes closely due to risk for refeeding syndrome -Add standard MVI and trace elements to TPN -Continue very Sensitive q6h SSI and adjust as needed  -Monitor TPN labs    Thank you for involving pharmacy in this patient's care.  Loura Back, PharmD, BCPS Clinical Pharmacist Clinical phone for 03/29/2020 until 3p is [phone] 03/29/2020 7:15 AM  **Pharmacist phone directory can be found on [email or web address] listed under [facility]**

## 2020-03-29 NOTE — Progress Notes (Signed)
Wife at bedside with questions regarding patient's surgery and course of treatment, Dr. Bedelia Person on unit and available to answer wife's questions. All questions answered and concerns resolved at this time.  Aris Lot, RN

## 2020-03-29 NOTE — Progress Notes (Signed)
Contacted E-Link about patient getting more agitated, despite being maxed out on precedex and fentanyl drip. Also made E-Link aware that patient's heart rate went up to the 190s briefly. This RN requested medication for high heart rate if needed. Will continue to monitor.

## 2020-03-29 NOTE — Progress Notes (Signed)
eLink Physician-Brief Progress Note Patient Name: Jorge Evans. DOB: 12-22-1950 MRN: [id number]   Date of Service  03/29/2020  HPI/Events of Note  Hypocalcemia, corrected level is 8.6 (ionized cal was low yesterday as well)  eICU Interventions  1 gram IV calcium gluconate x 1     Intervention Category Major Interventions: Electrolyte abnormality - evaluation and management  Oretha Milch 03/29/2020, 6:16 AM

## 2020-03-29 NOTE — Progress Notes (Signed)
.   General Surgery Follow Up Note  Subjective:    Overnight Issues:   Objective:  Vital signs for last 24 hours: Temp:  [92.12 F (33.4 C)-103.2 F (39.6 C)] 99.32 F (37.4 C) (12/26 0800) Pulse Rate:  [84-125] 99 (12/26 0800) Resp:  [18-36] 28 (12/26 0800) BP: (66-151)/(22-114) 103/51 (12/26 0800) SpO2:  [90 %-100 %] 95 % (12/26 0832) FiO2 (%):  [70 %-90 %] 70 % (12/26 0832)  Hemodynamic parameters for last 24 hours: CVP:  [18 mmHg-21 mmHg] 19 mmHg  Intake/Output from previous day: 12/25 0701 - 12/26 0700 In: 4232.6 [I.V.:3348.2; IV Piggyback:884.4] Out: 2400 [Urine:2140; Drains:260]  Intake/Output this shift: Total I/O In: 154.9 [I.V.:131.8; IV Piggyback:23.1] Out: 45 [Drains:45]  Vent settings for last 24 hours: Vent Mode: PRVC FiO2 (%):  [70 %-90 %] 70 % Set Rate:  [32 bmp] 32 bmp Vt Set:  [380 mL-420 mL] 380 mL PEEP:  [16 cmH20] 16 cmH20 Plateau Pressure:  [27 cmH20-31 cmH20] 28 cmH20  Physical Exam:  Gen: comfortable, no distress Neuro:  does not follow commands HEENT: intubated Neck: supple CV: RRR Pulm: unlabored breathing, mechanically ventilated Abd: soft, nontender, distended, drains functional GU: clear, yellow urine, net +1.8L in 24h Extr: wwp, 3+ edema   Results for orders placed or performed during the hospital encounter of 03/29/2020 (from the past 24 hour(s))  Glucose, capillary     Status: Abnormal   Collection Time: 03/28/20 12:12 PM  Result Value Ref Range   Glucose-Capillary 160 (H) 70 - 99 mg/dL  Heparin level (unfractionated)     Status: None   Collection Time: 03/28/20  1:45 PM  Result Value Ref Range   Heparin Unfractionated 0.33 0.30 - 0.70 IU/mL  I-STAT 7, (LYTES, BLD GAS, ICA, H+H)     Status: Abnormal   Collection Time: 03/28/20  3:32 PM  Result Value Ref Range   pH, Arterial 7.394 7.350 - 7.450   pCO2 arterial 41.5 32.0 - 48.0 mmHg   pO2, Arterial 71 (L) 83.0 - 108.0 mmHg   Bicarbonate 25.4 20.0 - 28.0 mmol/L   TCO2 27  22 - 32 mmol/L   O2 Saturation 94.0 %   Acid-Base Excess 0.0 0.0 - 2.0 mmol/L   Sodium 138 135 - 145 mmol/L   Potassium 4.1 3.5 - 5.1 mmol/L   Calcium, Ion 0.95 (L) 1.15 - 1.40 mmol/L   HCT 26.0 (L) 39.0 - 52.0 %   Hemoglobin 8.8 (L) 13.0 - 17.0 g/dL   Collection site art line    Drawn by RT    Sample type ARTERIAL   Glucose, capillary     Status: Abnormal   Collection Time: 03/28/20  5:34 PM  Result Value Ref Range   Glucose-Capillary 154 (H) 70 - 99 mg/dL  Blood gas, arterial     Status: Abnormal   Collection Time: 03/28/20  6:13 PM  Result Value Ref Range   FIO2 90.00    pH, Arterial 7.368 7.350 - 7.450   pCO2 arterial 45.8 32.0 - 48.0 mmHg   pO2, Arterial 141 (H) 83.0 - 108.0 mmHg   Bicarbonate 25.3 20.0 - 28.0 mmol/L   Acid-Base Excess 0.9 0.0 - 2.0 mmol/L   O2 Saturation 98.7 %   Patient temperature 38.3    Collection site A-LINE    Drawn by M.MEAD    Sample type ARTERIAL DRAW   Glucose, capillary     Status: Abnormal   Collection Time: 03/28/20 11:21 PM  Result Value Ref Range  Glucose-Capillary 163 (H) 70 - 99 mg/dL  Heparin level (unfractionated)     Status: None   Collection Time: 03/29/20  4:45 AM  Result Value Ref Range   Heparin Unfractionated 0.47 0.30 - 0.70 IU/mL  CBC     Status: Abnormal   Collection Time: 03/29/20  4:45 AM  Result Value Ref Range   WBC 12.5 (H) 4.0 - 10.5 K/uL   RBC 2.93 (L) 4.22 - 5.81 MIL/uL   Hemoglobin 8.5 (L) 13.0 - 17.0 g/dL   HCT 69.6 (L) 29.5 - 28.4 %   MCV 91.5 80.0 - 100.0 fL   MCH 29.0 26.0 - 34.0 pg   MCHC 31.7 30.0 - 36.0 g/dL   RDW 13.2 44.0 - 10.2 %   Platelets 120 (L) 150 - 400 K/uL   nRBC 0.2 0.0 - 0.2 %  Comprehensive metabolic panel     Status: Abnormal   Collection Time: 03/29/20  4:45 AM  Result Value Ref Range   Sodium 138 135 - 145 mmol/L   Potassium 4.0 3.5 - 5.1 mmol/L   Chloride 104 98 - 111 mmol/L   CO2 26 22 - 32 mmol/L   Glucose, Bld 178 (H) 70 - 99 mg/dL   BUN 35 (H) 8 - 23 mg/dL   Creatinine,  Ser 7.25 0.61 - 1.24 mg/dL   Calcium 6.3 (LL) 8.9 - 10.3 mg/dL   Total Protein 3.9 (L) 6.5 - 8.1 g/dL   Albumin 1.1 (L) 3.5 - 5.0 g/dL   AST 366 (H) 15 - 41 U/L   ALT 187 (H) 0 - 44 U/L   Alkaline Phosphatase 76 38 - 126 U/L   Total Bilirubin 1.8 (H) 0.3 - 1.2 mg/dL   GFR, Estimated >44 >03 mL/min   Anion gap 8 5 - 15  Magnesium     Status: None   Collection Time: 03/29/20  4:45 AM  Result Value Ref Range   Magnesium 2.2 1.7 - 2.4 mg/dL  Phosphorus     Status: None   Collection Time: 03/29/20  4:45 AM  Result Value Ref Range   Phosphorus 2.6 2.5 - 4.6 mg/dL  Glucose, capillary     Status: Abnormal   Collection Time: 03/29/20  5:25 AM  Result Value Ref Range   Glucose-Capillary 149 (H) 70 - 99 mg/dL    Assessment & Plan: The plan of care was discussed with the bedside nurse for the day, Megan, who is in agreement with this plan and no additional concerns were raised.   Present on Admission: . Choledocholithiasis . GERD (gastroesophageal reflux disease) . Iron deficiency anemia    LOS: 16 days   Additional comments:I reviewed the patient's new clinical lab test results.   and I reviewed the patients new imaging test results.    GERD IDA PMH Bilroth II for bleeding gastric ulcers ABL anemia - relatively stable.   A. Fib with RVR - on heparin and diltiazem gtts  Symptomatic cholelithiasis Choledocholithiasis without evidence of acute cholecystitis  S/p ERCP, placement of CBD stent 12/10 [name] -unable to perform a sphincterotomy and stone extraction due to anatomy s/p Billroth II S/plaparoscopic cholecystectomy12/16 Dr. Janee Morn Postoperative bile leak - POD#10/3 - CT scan 12/17 revealed moderate volumeperihepatic free fluid containing foci of air with non organized fluid and air in the gallbladder fossaconcerning for biloma - s/p IR drain placement 12/18 with yield high volume bilious fluid, culture  pending -s/p ERCP with sphincterotomy/sphincteroplasty, stent placement and lithotripsy 12/21 by Dr. Meridee Score -  Tbili 4.2 yesterday, recheck in AM.   - drain output remains murky and was 515 total             - OR for laparoscopic exploration due to increased free air on Ct 12/23, washout and drain placement 12/23 by Dr. Corliss Skains, had new drain placed in perihepatic region which was undrained by IR drain.    VDRF: appears to be in moderate ARDS based on ABG yest AM. Current vent settings 70% & 16. Recommend checking ABG today. Recommend inversing I:E to 1:1 or 1.5:1 to improve oxygenation and be able to lower FiO2. Recommend ensuring adequate sedation while I:E is reversed. Currently on low TV settings, recommend allowing permissive hypercapnia as long as pH is >7.2. Appears very volume overloaded and recommend aggressive diuresis with lasix to achieve at least net even, preferable net negative 1-2L for 24h. Would recommend increasing ordered lasix 40 q12h to at least 80 q12 or even q8 to achieve this.  ID: zosyn 12/18>>at least 12/27, aspirated on induction in OR 12/23, monitor clinically, already on zosyn FEN:IVF,TPN, anticipated ileus, but can trial trickle TF when passing flatus/BM (not yet) VTE: SCDs,heparin gtt     Diamantina Monks, MD Trauma & General Surgery Please use [email or web address] to contact on call provider  03/29/2020  *Care during the described time interval was provided by me. I have reviewed this patient's available data, including medical history, events of note, physical examination and test results as part of my evaluation.

## 2020-03-29 NOTE — Progress Notes (Signed)
NAME:  Jorge Evans., MRN:  [id number], DOB:  [birth year], LOS: 13 ADMISSION DATE:  03/06/2020, CONSULTATION DATE:  03/10/2020 REFERRING MD:  Dr. Garald Braver, CHIEF COMPLAINT:  Respiratory failure    Brief History:  69 year old male presented originally 12/9 with complaints of abdominal pain, nausea, and vomiting, imaging concerning for choledocholithiasis underwent ERCP with stent placement later followed with cholecystectomy.  Overnight 12/23 patient developed worsening abdominal pain patient was taken for diagnostic laparoscopy with placement of intra-abdominal drain.  She remained intubated postoperatively due to respiratory compromise including questionable aspiration    Past Medical History   Past Medical History:  Diagnosis Date  . Anemia   . GERD (gastroesophageal reflux disease)     Significant Hospital Events   -ercp with cbd stent 12/10  -Cholecystectomy 12/16  -IR drain for bile leak 12/18  -ercp and stent placement 12/21 -diagnostic lap 12/23 and intra-abdominal drain, A. fib with RVR requiring diltiazem, septic shock requiring phenylephrine 12/24: More hypoxic requiring, chest x-ray showing bilateral airspace disease worrisome for evolving ARDS, placed on ARDS protocol, added fentanyl infusion.  Metabolic acidosis,, with ongoing septic shock.  Off calcium channel blocker.  Titrating phenylephrine.  Additional volume resuscitation.  Had bronchoscopy.,  Had significant clear bilious aspirate which was lavaged.  Placed on bicarbonate drip for ongoing acidosis 12/25: Acid-base improved.  CVP now 23, off pressors.  One-time Lasix.  Trying to minimize fluid administration.  Still on high PEEP/FiO2 spiking fever in excess of 103, change antibiotic coverage to meropenem and vancomycin.12/26: Hemodynamically stable overnight, no fever spike, white blood cell count significantly improved.  Consults:  GI Surgery IR Ccm [facility] cardiology  Procedures:  Endotracheal tube  12/23  Significant Diagnostic Tests:  12/9 ct abd/pelvis: 1. Interval development of moderate intra and extrahepatic biliary dilatation with multiple rounded filling defects within the mid to distal common duct, suspicious for choledocholithiasis. Suggest correlation with LFTs, further evaluation with MRCP may be obtained. 2. Streaky hyperdense foci within the bladder, suspect that this is due to excreted contrast as opposed to bladder mass. 3. Status post partial gastrectomy with no evidence for an obstruction. 4. Aortic atherosclerosis.  12/17 ct abd/pelvis: 1. Interval cholecystectomy. Moderate volume of perihepatic free fluid containing foci of air with non organized fluid and air in the gallbladder fossa. Given the amount of fluid, biloma is favored over postoperative seroma. Sterility is indeterminate by imaging. Consider nuclear medicine hepatic biliary scan to assess for bile leak. 2. Plastic common bile duct stent in place. Suspected residual 9 mm stone at the proximal aspect of the stent. Common bile duct dilatation of 16 mm, with improved but persistent intrahepatic biliary dilatation on the left. Pneumobilia related to recent procedure. 3. Small right and trace left pleural effusions. 4. Absent renal excretion on delayed phase imaging, can be seen with renal dysfunction. 5. Mild distal colonic diverticulosis without diverticulitis. 12/20 echo: LVEF 65-70% RV normal 12/22 ct abdomen pelvis: 1. Moderate amount of intra-abdominal free air, increased in severity when compared to the prior study. While this is likely secondary to the patient's recent surgical intervention, the presence of a perforated hollow viscus cannot completely be excluded. 2. Additional findings consistent with a postoperative ileus. 3. Right-sided surgical drain positioning, as described above, with near-total resolution of the perihepatic fluid seen on the prior study. 4. Interval distal  pancreatic duct stent placement since the prior exam. 5. Moderate severity anasarca. 6. Small hiatal hernia. 7. Aortic atherosclerosis.  Micro Data:  12/10 sars2: neg 12/18  perihepatic biloma: Multiple organisms present, none predominant, no anaerobes isolated.  Antimicrobials:  Zosyn 12/17-> 12/25 Vancomycin 12/ 23>> Meropenem 12/25>>> Interim history/subjective:  Still heavily sedated Objective   Blood pressure (Abnormal) 103/51, pulse 99, temperature 99.32 F (37.4 C), temperature source Esophageal, resp. rate (Abnormal) 28, height 5' 5.75" (1.67 m), weight 69.9 kg, SpO2 95 %. CVP:  [18 mmHg-21 mmHg] 19 mmHg  Vent Mode: PRVC FiO2 (%):  [70 %-90 %] 70 % Set Rate:  [32 bmp] 32 bmp Vt Set:  [380 mL-420 mL] 380 mL PEEP:  [16 cmH20] 16 cmH20 Plateau Pressure:  [27 cmH20-31 cmH20] 28 cmH20   Intake/Output Summary (Last 24 hours) at 03/29/2020 0930 Last data filed at 03/29/2020 4270 Gross per 24 hour  Intake 4005.46 ml  Output 2445 ml  Net 1560.46 ml   Filed Weights   03/08/2020 1442 04-19-20 1441  Weight: 69.9 kg 69.9 kg    Examination:  General this is a 69 year old white male he is heavily sedated on mechanical ventilator HEENT normocephalic atraumatic orally intubated Pulmonary: Diffuse scattered rhonchi, current plateau pressure still around 28, still on high PEEP of 16/FiO2 90% but saturations 100%, with room to decrease Cardiac: Regular irregular on monitor, rate improved now that fever curve improved Abdomen: Distended, surgical site unremarkable currently, bilious cloudy output from JP drains seem to be decreasing Neuro: Heavily sedated Extremities: Diffuse anasarca with scrotal edema as well GU: Clear yellow  Resolved Hospital Problem list   Septic shock resolved Worsening lactic acidosis with anion gap metabolic acidosis. Assessment & Plan:  Acute choledocholithiasis, status post cholecystectomy complicated by bile leak, and abdominal peritonitis 12/18  requiring stenting on 12/21, and subsequent return to the OR 12/23 with washout, lysis of adhesion, and new drain placement Plan Continue drain management per general surgery  N.p.o.  TPN    Severe sepsis secondary to abdominal peritonitis-->shock resolved as of 12/25. Looks a little volume overloaded -Preliminary surgical abscess culture: Multiple organisms present/polymicrobial Plan Continue wound care  Antibiotic day #9, changed to Zosyn to meropenem on 12/25  Vancomycin day #4  cont wound care  Severe total body overload following aggressive volume resuscitation Plan Continue IV Lasix  Acute Hypoxic respiratory Failure 2/2 aspiration PNA and evolving ALI/ARDS Remains on high FiO2 and PEEP Portable chest x-ray personally reviewed Shows the endotracheal tube in satisfactory position continues to have bilateral airspace disease right greater than left aeration may be slightly improved when comparing serial films Plan Continue full ventilator support  Continue to wean PEEP/FiO2 for saturations greater than 88  Continue RASS goal at -3 for another 24 hours then reassess  IV Lasix x2 today  Plateau pressure goal less than 30  A.m. chest x-ray  VAP bundle  Antibiotics as above  Minimal IV fluids  A.m. chest x-ray  afib  Was in RVR; now CVR 12/24 Plan Continue telemetry monitoring  Avoid fever  Digoxin  IV heparin   Anemia without evidence of bleeding Plan Continue to trend  Hyperglycemia Blood glucose within goal Plan Continue sliding scale insulin  Best practice:  Diet: npo TNA per surg Pain/Anxiety/Delirium protocol (if indicated): per protocol VAP protocol (if indicated): per protocol DVT prophylaxis: changed to IV heparin 12/24 GI prophylaxis: ppi Glucose control: ssi Mobility: bedrest Code Status: FULL Family Communication updated daughter at bedside on 12/25 both the morning and again in the afternoon  disposition: ICU  Critical care time: 33 minutes   Simonne Martinet ACNP-BC [facility] Pulmonary/Critical Care Pager # [phone] OR #  [phone] if no answer

## 2020-03-29 NOTE — Progress Notes (Signed)
eLink Physician-Brief Progress Note Patient Name: Jorge Evans. DOB: 1950/07/03 MRN: [id number]   Date of Service  03/29/2020  HPI/Events of Note  Pt with increased agitation and sedation is maxed with Fent @ 200 and Precedex @ 1.2. Requesting increase max dose of both.  With agitation Pt has periods of RAfib with RVR and no PRN ordered ordered  eICU Interventions  Camera eval done. Calmer now, HR 108. In synchrony.  Discussed with jonathan. Continue care. Call back if worsens.      Intervention Category Intermediate Interventions: Other:  [name] 03/29/2020, 8:04 PM

## 2020-03-29 NOTE — Progress Notes (Signed)
ANTICOAGULATION CONSULT NOTE  Pharmacy Consult:  Heparin Indication: atrial fibrillation  Allergies  Allergen Reactions  . Other Itching  . Demerol [Meperidine] Rash    Patient Measurements: Height: 5' 5.75" (167 cm) Weight: 69.9 kg (154 lb) IBW/kg (Calculated) : 63.22 Heparin Dosing Weight: 69 kg  Vital Signs: Temp: 99.32 F (37.4 C) (12/26 0800) Temp Source: Esophageal (12/26 0800) BP: 103/51 (12/26 0800) Pulse Rate: 99 (12/26 0800)  Labs: Recent Labs    03/27/20 0028 03/27/20 0500 03/28/20 0512 03/28/20 1345 03/28/20 1532 03/29/20 0445  HGB 11.4*   < > 9.2*  --  8.8* 8.5*  HCT 35.6*   < > 28.9*  --  26.0* 26.8*  PLT 370  --  202  --   --  120*  HEPARINUNFRC  --    < > 0.26* 0.33  --  0.47  CREATININE 1.01  --  1.19  --   --  1.13   < > = values in this interval not displayed.    Estimated Creatinine Clearance: 54.4 mL/min (by C-G formula based on SCr of 1.13 mg/dL).  Assessment: 77 yoM admitted with abdominal pain s/p ERCP 12/10, cholecystectomy 12/16, and drain placement 12/18 and 12/23.  Patient developed AFib and pharmacy consulted for heparin dosing.  Heparin level 0.47 at goal on high rate of 2550 units/hr - target low-mid range of goal.  H/H dropped 12/24 postop and slow trended down since to 8.5/26.8, plt down to 120. Not suspecting HIT with 4T score ~3 and other factors present like sepsis with volume resuscitation and post-op. No bleeding noted.   Goal of Therapy:  Heparin level 0.3-0.7 units/ml - target low-mid range of goal Monitor platelets by anticoagulation protocol: Yes   Plan:  Continue heparin IV at 2550 units/hr Monitor daily CBC, heparin levels daily, s/sx bleeding Follow-up for longterm anticoagulation plans   Thank you for involving pharmacy in this patient's care.  Loura Back, PharmD, BCPS Clinical Pharmacist Clinical phone for 03/29/2020 until 3p is [phone] 03/29/2020 10:08 AM  **Pharmacist phone directory can be found on  [email or web address] listed under [facility]**

## 2020-03-30 ENCOUNTER — Inpatient Hospital Stay (HOSPITAL_COMMUNITY): Payer: Medicare Other

## 2020-03-30 DIAGNOSIS — J9601 Acute respiratory failure with hypoxia: Secondary | ICD-10-CM

## 2020-03-30 DIAGNOSIS — Z9911 Dependence on respirator [ventilator] status: Secondary | ICD-10-CM

## 2020-03-30 DIAGNOSIS — D649 Anemia, unspecified: Secondary | ICD-10-CM

## 2020-03-30 LAB — HEPARIN LEVEL (UNFRACTIONATED)
Heparin Unfractionated: 0.25 IU/mL — ABNORMAL LOW (ref 0.30–0.70)
Heparin Unfractionated: 0.17 IU/mL — ABNORMAL LOW (ref 0.30–0.70)

## 2020-03-30 LAB — DIFFERENTIAL
Abs Immature Granulocytes: 0.49 10*3/uL — ABNORMAL HIGH (ref 0.00–0.07)
Basophils Absolute: 0 10*3/uL (ref 0.0–0.1)
Basophils Relative: 0 %
Eosinophils Absolute: 0 10*3/uL (ref 0.0–0.5)
Eosinophils Relative: 0 %
Immature Granulocytes: 5 %
Lymphocytes Relative: 4 %
Lymphs Abs: 0.4 10*3/uL — ABNORMAL LOW (ref 0.7–4.0)
Monocytes Absolute: 0.3 10*3/uL (ref 0.1–1.0)
Monocytes Relative: 3 %
Neutro Abs: 7.9 10*3/uL — ABNORMAL HIGH (ref 1.7–7.7)
Neutrophils Relative %: 88 %

## 2020-03-30 LAB — CBC
HCT: 27 % — ABNORMAL LOW (ref 39.0–52.0)
Hemoglobin: 8.7 g/dL — ABNORMAL LOW (ref 13.0–17.0)
MCH: 29.2 pg (ref 26.0–34.0)
MCHC: 32.2 g/dL (ref 30.0–36.0)
MCV: 90.6 fL (ref 80.0–100.0)
Platelets: 117 10*3/uL — ABNORMAL LOW (ref 150–400)
RBC: 2.98 MIL/uL — ABNORMAL LOW (ref 4.22–5.81)
RDW: 14.6 % (ref 11.5–15.5)
WBC: 9.1 10*3/uL (ref 4.0–10.5)
nRBC: 0.4 % — ABNORMAL HIGH (ref 0.0–0.2)

## 2020-03-30 LAB — TRIGLYCERIDES
Triglycerides: 94 mg/dL (ref <150)
Triglycerides: 552 mg/dL — ABNORMAL HIGH (ref <150)

## 2020-03-30 LAB — COMPREHENSIVE METABOLIC PANEL
ALT: 128 U/L — ABNORMAL HIGH (ref 0–44)
AST: 93 U/L — ABNORMAL HIGH (ref 15–41)
Albumin: 1 g/dL — ABNORMAL LOW (ref 3.5–5.0)
Alkaline Phosphatase: 68 U/L (ref 38–126)
Anion gap: 9 (ref 5–15)
BUN: 37 mg/dL — ABNORMAL HIGH (ref 8–23)
CO2: 28 mmol/L (ref 22–32)
Calcium: 6.9 mg/dL — ABNORMAL LOW (ref 8.9–10.3)
Chloride: 104 mmol/L (ref 98–111)
Creatinine, Ser: 1.02 mg/dL (ref 0.61–1.24)
GFR, Estimated: >60 mL/min (ref >60)
Glucose, Bld: 199 mg/dL — ABNORMAL HIGH (ref 70–99)
Potassium: 4.1 mmol/L (ref 3.5–5.1)
Sodium: 141 mmol/L (ref 135–145)
Total Bilirubin: 2.2 mg/dL — ABNORMAL HIGH (ref 0.3–1.2)
Total Protein: 4.1 g/dL — ABNORMAL LOW (ref 6.5–8.1)

## 2020-03-30 LAB — GLUCOSE, CAPILLARY
Glucose-Capillary: 144 mg/dL — ABNORMAL HIGH (ref 70–99)
Glucose-Capillary: 157 mg/dL — ABNORMAL HIGH (ref 70–99)
Glucose-Capillary: 169 mg/dL — ABNORMAL HIGH (ref 70–99)
Glucose-Capillary: 180 mg/dL — ABNORMAL HIGH (ref 70–99)

## 2020-03-30 LAB — MAGNESIUM: Magnesium: 2.2 mg/dL (ref 1.7–2.4)

## 2020-03-30 LAB — PHOSPHORUS: Phosphorus: 2.1 mg/dL — ABNORMAL LOW (ref 2.5–4.6)

## 2020-03-30 LAB — PREALBUMIN: Prealbumin: <5 mg/dL — ABNORMAL LOW (ref 18–38)

## 2020-03-30 MED ORDER — FUROSEMIDE 10 MG/ML IJ SOLN
80.0000 mg | Freq: Three times a day (TID) | INTRAMUSCULAR | Status: DC
  Administered 2020-03-30 – 2020-03-31 (×3): 80 mg via INTRAVENOUS
  Filled 2020-03-30 (×3): qty 8

## 2020-03-30 MED ORDER — NOREPINEPHRINE 4 MG/250ML-% IV SOLN
0.0000 ug/min | INTRAVENOUS | Status: DC
  Administered 2020-03-30: 17:00:00 2 ug/min via INTRAVENOUS
  Administered 2020-03-31: 06:00:00 5 ug/min via INTRAVENOUS
  Administered 2020-04-01: 06:00:00 4 ug/min via INTRAVENOUS
  Filled 2020-03-30 (×4): qty 250

## 2020-03-30 MED ORDER — ENOXAPARIN SODIUM 80 MG/0.8ML ~~LOC~~ SOLN
70.0000 mg | Freq: Two times a day (BID) | SUBCUTANEOUS | Status: DC
  Administered 2020-03-30 – 2020-04-02 (×6): 70 mg via SUBCUTANEOUS
  Filled 2020-03-30 (×6): qty 0.8

## 2020-03-30 MED ORDER — PROPOFOL 1000 MG/100ML IV EMUL
5.0000 ug/kg/min | INTRAVENOUS | Status: DC
  Administered 2020-03-30: 08:00:00 5 ug/kg/min via INTRAVENOUS
  Administered 2020-03-31: 06:00:00 15 ug/kg/min via INTRAVENOUS
  Administered 2020-03-31 (×2): 35 ug/kg/min via INTRAVENOUS
  Administered 2020-04-01: 11:00:00 30 ug/kg/min via INTRAVENOUS
  Administered 2020-04-01: 06:00:00 35 ug/kg/min via INTRAVENOUS
  Administered 2020-04-01 – 2020-04-03 (×6): 25 ug/kg/min via INTRAVENOUS
  Filled 2020-03-30 (×15): qty 100

## 2020-03-30 MED ORDER — INSULIN ASPART 100 UNIT/ML ~~LOC~~ SOLN
0.0000 [IU] | Freq: Four times a day (QID) | SUBCUTANEOUS | Status: DC
  Administered 2020-03-30 (×2): 2 [IU] via SUBCUTANEOUS
  Administered 2020-03-31 – 2020-04-01 (×8): 1 [IU] via SUBCUTANEOUS
  Administered 2020-04-02: 13:00:00 2 [IU] via SUBCUTANEOUS
  Administered 2020-04-02 (×3): 1 [IU] via SUBCUTANEOUS
  Administered 2020-04-03: 2 [IU] via SUBCUTANEOUS
  Administered 2020-04-03 (×3): 1 [IU] via SUBCUTANEOUS

## 2020-03-30 MED ORDER — INSULIN GLARGINE 100 UNIT/ML ~~LOC~~ SOLN
5.0000 [IU] | Freq: Every day | SUBCUTANEOUS | Status: DC
  Administered 2020-03-30 – 2020-04-03 (×5): 5 [IU] via SUBCUTANEOUS
  Filled 2020-03-30 (×6): qty 0.05

## 2020-03-30 MED ORDER — POTASSIUM PHOSPHATES 15 MMOLE/5ML IV SOLN
15.0000 mmol | Freq: Once | INTRAVENOUS | Status: AC
  Administered 2020-03-30: 10:00:00 15 mmol via INTRAVENOUS
  Filled 2020-03-30: qty 5

## 2020-03-30 MED ORDER — NOREPINEPHRINE 4 MG/250ML-% IV SOLN
INTRAVENOUS | Status: AC
  Filled 2020-03-30: qty 250

## 2020-03-30 MED ORDER — TRAVASOL 10 % IV SOLN
INTRAVENOUS | Status: AC
  Filled 2020-03-30: qty 900

## 2020-03-30 NOTE — Progress Notes (Signed)
ANTICOAGULATION CONSULT NOTE  Pharmacy Consult:  Heparin>lovenox Indication: atrial fibrillation  Allergies  Allergen Reactions  . Other Itching  . Demerol [Meperidine] Rash    Patient Measurements: Height: 5' 5.75" (167 cm) Weight: 69.9 kg (154 lb) IBW/kg (Calculated) : 63.22 Heparin Dosing Weight: 69 kg  Vital Signs: Temp: 98.96 F (37.2 C) (12/27 1300) Temp Source: Esophageal (12/27 0800) BP: 98/58 (12/27 1300) Pulse Rate: 106 (12/27 1300)  Labs: Recent Labs    03/28/20 0512 03/28/20 1345 03/29/20 0445 03/29/20 1123 03/30/20 0452 03/30/20 1240  HGB 9.2*   < > 8.5* 8.5* 8.7*  --   HCT 28.9*   < > 26.8* 25.0* 27.0*  --   PLT 202  --  120*  --  117*  --   HEPARINUNFRC 0.26*   < > 0.47  --  0.25* 0.17*  CREATININE 1.19  --  1.13  --  1.02  --    < > = values in this interval not displayed.    Estimated Creatinine Clearance: 60.2 mL/min (by C-G formula based on SCr of 1.02 mg/dL).  Assessment: 34 yoM admitted with abdominal pain s/p ERCP 12/10, cholecystectomy 12/16, and drain placement 12/18 and 12/23.  Patient developed AFib and pharmacy consulted for heparin dosing.  Now to transition to lovenox, no further surgical interventions planned at this time  Goal of Therapy:  Anti-Xa level 0.6-1 units/ml 4hrs after LMWH dose given  Monitor platelets by anticoagulation protocol: Yes   Plan:  D/c heparin gtt Start lovenox 1mg /kg (70mg ) SQ q12h Monitor renal function, CBC, s/s bleeding F/u TF advancement/toleration and ability to transition to DOAC  , PharmD Clinical Pharmacist Please check AMION for all [facility] Pharmacy numbers 03/30/2020 2:25 PM

## 2020-03-30 NOTE — Progress Notes (Signed)
PHARMACY - TOTAL PARENTERAL NUTRITION CONSULT NOTE   Indication: severe malnutrition, anticipated ileus  Patient Measurements: Height: 5' 5.75" (167 cm) Weight: 69.9 kg (154 lb) IBW/kg (Calculated) : 63.22 TPN AdjBW (KG): 69.9 Body mass index is 25.05 kg/m. Usual Weight: 66 kg  Assessment:  69 yom who presented to the ED on 12/09 with abdominal pain and N/V. PMH for Bilroth II for bleeding gastric ulcers. He was found to have symptomatic cholelithiasis and is s/p ERCP with placement of CBD stent on 12/10 - unable to perform a sphincterotomy and stone extraction due to anatomy s/p Billroth II. He is s/p lap-chole on 12/16 which was complicated by a post-op bile leak. IR placed placed a drain on 12/18. Repeat ERCP with sphincterotomy/sphincteroplasty, stent placement and lithotripsy done on 12/21. Unable to advance diet due to nausea and hiccups and has been NPO or on clear liquids since 12/16. Pharmacy consulted for nutritional support for severe acute malnutrition and anticipated ileus.   Glucose / Insulin: no hx DM; CBGs mostly <180 last 24hr on TPN, used 4 units insulin/24 hr, Lantus 5 added Electrolytes: Phos low 2.1, Mg 2.2; CoCa 9.3, all others WNL  Renal: SCr 1.02, BUN 37 LFTs / TGs: AST/ALT down 93/128, TG WNL, Tbili up 2.2 Prealbumin / albumin: albumin 1, prealbumin down <5 Intake / Output; MIVF: diuresing with Lasix 40 > 80 IV q8h, UOP 1.3 ml/kg/hr, net +22.8L. NGT to LIWS, R abd drains O/P (bilious) GI Imaging: Surgeries / Procedures:  12/10 s/p ERCP with stent placement 12/16 s/p laparoscopic cholecystectomy 12/18 s/p CT guided drainage of perihepatic fluid collection and drain placement 12/21 s/p repeat ERCP 12/23 s/p diagnostic laparoscopy with placement of intra-abdominal drain  Central access: Triple lumen PICC placed 12/23 TPN start date: 03/17/2020  Nutritional Goals (per RD recommendation on 12/23): kCal: 2000-2200, Protein: 90-110, Fluid: >=2L Goal TPN rate  is 75 mL/hr (provides 90 g of protein and 2012 kcals per day) - minimizing fluid, TPN adjusted for lower volume on 12/26  Current Nutrition:  NPO and TPN  Plan:  -Continue TPN at goal rate 75 mL/hr at 1800  - provides 90 g of protein, 31 g lipid, 396 g dextrose, and 2012 kcal providing 100% of needs -Electrolytes in TPN: 18mEq/L of Na, 63mEq/L of Ca, 29mEq/L of Mg; 62mEq/L of K; incr 20 mmol/L of Phos. Continue max acetate. -Watch lytes closely with diuresis - replace outside of TPN -Add standard MVI and trace elements to TPN -Change to Sensitive q6h SSI and adjust as needed  -Monitor TPN labs    -K Phos 15 mmol IV x1 (expect K to decrease with diuresis) -F/U need for continued propofol and adjusting lipids in TPN  Thank you for involving pharmacy in this patient's care.  Loura Back, PharmD, BCPS Clinical Pharmacist Clinical phone for 03/30/2020 until 3p is [phone] 03/30/2020 7:17 AM  **Pharmacist phone directory can be found on [email or web address] listed under [facility]**

## 2020-03-30 NOTE — Progress Notes (Addendum)
Nutrition Follow-up  DOCUMENTATION CODES:   Severe malnutrition in context of acute illness/injury  INTERVENTION:   -TPN management per pharmacy -RD will monitor for ability to start enteral nutrition  NUTRITION DIAGNOSIS:   Severe Malnutrition related to acute illness (choledocholithiasis s/p cholecystectomy) as evidenced by energy intake < or equal to 50% for > or equal to 5 days,mild fat depletion,moderate muscle depletion.  Ongoing  GOAL:   Patient will meet greater than or equal to 90% of their needs  Progressing   MONITOR:   Diet advancement,Vent status,Labs,Weight trends,Skin,I & O's  REASON FOR ASSESSMENT:   Consult New TPN/TNA  ASSESSMENT:   69 year old male who presented to the ED on 12/09 with abdominal pain and N/V. PMH of GERD, IDA, s/p Billroth II. Admitted with choledocholithiasis.  12/10 - s/p ERCP with stent placement 12/16 - s/p laparoscopic cholecystectomy 12/18 - s/p CT guided drainage of perihepatic fluid collection and drain placement 12/21 - s/p repeat ERCP 12/23- NGT placed and connected to low, intermittent suction, PICC placed, TPN initiated, s/p Procedure performed: Diagnostic laparoscopy with placement of intra-abdominal drain 12/24- s/p bronchoscopy  Patient is currently intubated on ventilator support MV: 12.5 L/min Temp (24hrs), Avg:99.3 F (37.4 C), Min:98.24 F (36.8 C), Max:100.4 F (38 C)  Propofol: 3.8 ml/hr (provides 100 kcals daily at current rate)  Reviewed I/O's: +1.6 L x 24 hours and +22.9 L since 03/16/20  UOP: 2.1 L x 24 hours  Drain output: 315 ml x 24 hours  Per pharmacy note, pt receiving TPN at 75 ml/hr, which provides 2012 kcals and 90 grams protein, which meets 100% of estimated kcal needs and 86% of estimated protein needs.   Per general surgery notes, pt with anticipated ileus, but plan to trial trickle feeds when pt passing flatus/ BM.   Medications reviewed and include lasix, miralax, senna, and  precedex.   Labs reviewed: CBGS: 157-169 (inpatient orders for glycemic control are 0-6 units insulin aspart every 6 hours and 5 units insulin glargine daily).   Diet Order:   Diet Order            Diet NPO time specified  Diet effective now                 EDUCATION NEEDS:   No education needs have been identified at this time  Skin:  Skin Assessment: Skin Integrity Issues: Skin Integrity Issues:: Incisions Incisions: abdomen  Last BM:  03/25/20  Height:   Ht Readings from Last 1 Encounters:  03/27/20 5' 5.75" (1.67 m)    Weight:   Wt Readings from Last 1 Encounters:  03/17/2020 69.9 kg   BMI:  Body mass index is 25.05 kg/m.  Estimated Nutritional Needs:   Kcal:  1863  Protein:  105-120 grams  Fluid:  >1.8 L    Levada Schilling, RD, LDN, CDCES Registered Dietitian II Certified Diabetes Care and Education Specialist Please refer to [facility] for RD and/or RD on-call/weekend/after hours pager

## 2020-03-30 NOTE — Progress Notes (Signed)
ANTICOAGULATION CONSULT NOTE - Follow Up Consult  Pharmacy Consult for heparin Indication: atrial fibrillation  Labs: Recent Labs    03/28/20 0512 03/28/20 1345 03/28/20 1532 03/29/20 0445 03/29/20 1123 03/30/20 0452  HGB 9.2*  --    < > 8.5* 8.5* 8.7*  HCT 28.9*  --    < > 26.8* 25.0* 27.0*  PLT 202  --   --  120*  --  117*  HEPARINUNFRC 0.26* 0.33  --  0.47  --  0.25*  CREATININE 1.19  --   --  1.13  --  1.02   < > = values in this interval not displayed.    Assessment: 69yo male subtherapeutic on heparin after two levels at goal; no gtt issues or signs of bleeding per RN.  Goal of Therapy:  Heparin level 0.3-0.7 units/ml   Plan:  Will increase heparin gtt by 5% to 2700 units/hr and check level in 6 hours.    Vernard Gambles, PharmD, BCPS  03/30/2020,6:04 AM

## 2020-03-30 NOTE — Progress Notes (Signed)
Progress Note  Patient Name: Jorge Evans. Date of Encounter: 03/30/2020  [facility] Cardiologist: Elouise Munroe, MD   Subjective   Intubated and sedated  Inpatient Medications    Scheduled Meds: . chlorhexidine gluconate (MEDLINE KIT)  15 mL Mouth Rinse BID  . Chlorhexidine Gluconate Cloth  6 each Topical Daily  . digoxin  0.25 mg Intravenous Daily  . furosemide  80 mg Intravenous Q8H  . insulin aspart  0-6 Units Subcutaneous Q6H  . insulin glargine  5 Units Subcutaneous Daily  . lidocaine  1 patch Transdermal Q24H  . mouth rinse  15 mL Mouth Rinse 10 times per day  . pantoprazole (PROTONIX) IV  40 mg Intravenous Q12H  . polyethylene glycol  17 g Per Tube Daily  . senna-docusate  2 tablet Per Tube BID  . sodium chloride flush  10-40 mL Intracatheter Q12H  . sodium chloride flush  5 mL Intracatheter Q8H   Continuous Infusions: . sodium chloride Stopped (03/30/20 0818)  . dexmedetomidine (PRECEDEX) IV infusion 1.2 mcg/kg/hr (03/30/20 0900)  . fentaNYL infusion INTRAVENOUS 200 mcg/hr (03/30/20 0900)  . heparin 2,700 Units/hr (03/30/20 0900)  . meropenem (MERREM) IV Stopped (03/30/20 3662)  . phenylephrine (NEO-SYNEPHRINE) Adult infusion Stopped (03/28/20 9476)  . potassium PHOSPHATE IVPB (in mmol)    . propofol (DIPRIVAN) infusion 5 mcg/kg/min (03/30/20 0900)  . TPN ADULT (ION) 75 mL/hr at 03/30/20 0900  . vancomycin 150 mL/hr at 03/30/20 0900   PRN Meds: acetaminophen **OR** acetaminophen, fentaNYL, fentaNYL (SUBLIMAZE) injection, sodium chloride flush   Vital Signs    Vitals:   03/30/20 0700 03/30/20 0708 03/30/20 0741 03/30/20 0800  BP: 104/73  104/68 (!) 106/58  Pulse: 100 (!) 133  (!) 105  Resp: (!) 31 (!) 37 (!) 34 16  Temp: 99.32 F (37.4 C) 98.24 F (36.8 C)  99.14 F (37.3 C)  TempSrc:      SpO2: 98% 97% 98% 94%  Weight:      Height:        Intake/Output Summary (Last 24 hours) at 03/30/2020 0941 Last data filed at 03/30/2020  0900 Gross per 24 hour  Intake 4276.78 ml  Output 2795 ml  Net 1481.78 ml   Last 3 Weights 03/25/2020 03/24/2020 11/19/2019  Weight (lbs) 154 lb 154 lb 155 lb  Weight (kg) 69.854 kg 69.854 kg 70.308 kg      Telemetry    Atrial fibrillation with intermittent rapid rate- Personally Reviewed  Physical Exam   GEN: Intubated and sedated; anasarca Neck: JVP difficult to assess Cardiac: irregular  Respiratory: CTA anteriorly GI: Mildly distended; s/p surgery MS: 4+ edema Neuro:  sedated   Labs     Chemistry Recent Labs  Lab 03/27/20 0028 03/27/20 0852 03/28/20 0512 03/28/20 1532 03/29/20 0445 03/29/20 1123 03/30/20 0452  NA 134*   < > 136   < > 138 141 141  K 4.2   < > 4.1   < > 4.0 4.2 4.1  CL 104  --  103  --  104  --  104  CO2 14*  --  21*  --  26  --  28  GLUCOSE 142*  --  171*  --  178*  --  199*  BUN 19  --  28*  --  35*  --  37*  CREATININE 1.01  --  1.19  --  1.13  --  1.02  CALCIUM 7.0*  --  6.2*  --  6.3*  --  6.9*  PROT 4.1*  --   --   --  3.9*  --  4.1*  ALBUMIN 1.8*  --   --   --  1.1*  --  1.0*  AST 62*  --   --   --  182*  --  93*  ALT 64*  --   --   --  187*  --  128*  ALKPHOS 48  --   --   --  76  --  68  BILITOT 4.2*  --   --   --  1.8*  --  2.2*  GFRNONAA >60  --  >60  --  >60  --  >60  ANIONGAP 16*  --  12  --  8  --  9   < > = values in this interval not displayed.     Hematology Recent Labs  Lab 03/28/20 0512 03/28/20 1532 03/29/20 0445 03/29/20 1123 03/30/20 0452  WBC 22.8*  --  12.5*  --  9.1  RBC 3.16*  --  2.93*  --  2.98*  HGB 9.2*   < > 8.5* 8.5* 8.7*  HCT 28.9*   < > 26.8* 25.0* 27.0*  MCV 91.5  --  91.5  --  90.6  MCH 29.1  --  29.0  --  29.2  MCHC 31.8  --  31.7  --  32.2  RDW 14.5  --  14.5  --  14.6  PLT 202  --  120*  --  117*   < > = values in this interval not displayed.     Radiology    DG Chest Port 1 View  Result Date: 03/30/2020 CLINICAL DATA:  ARDS. EXAM: PORTABLE CHEST 1 VIEW COMPARISON:  Multiple  recent chest x-rays. The most recent is 03/29/2020 FINDINGS: The endotracheal tube, NG tube and right PICC line are stable. The cardiac silhouette, mediastinal and hilar contours are within normal limits and unchanged. Persistent bilateral pulmonary infiltrates. No pleural effusions. Stable right upper quadrant drainage catheter. IMPRESSION: 1. Stable support apparatus. 2. Persistent bilateral pulmonary infiltrates. Electronically Signed   By: Rudie Meyer M.D.   On: 03/30/2020 07:23   DG CHEST PORT 1 VIEW  Result Date: 03/29/2020 CLINICAL DATA:  ARDS. EXAM: PORTABLE CHEST 1 VIEW COMPARISON:  03/28/2020 FINDINGS: 0621 hours. Endotracheal tube tip is 3.9 cm above the base of the carina. The NG tube passes into the stomach although the distal tip position is not included on the film. Right PICC line tip overlies the proximal to mid SVC level given leftward patient rotation. Bibasilar airspace disease again noted without substantial pleural effusion. Nodular appearance in the right lung has decreased in the interval. Pigtail catheter overlying the right upper quadrant is again noted. IMPRESSION: 1. Interval decrease in nodular appearance in the right lung. 2. Persistent bibasilar airspace disease, not substantially changed in the interval. Electronically Signed   By: Kennith Center M.D.   On: 03/29/2020 07:09    Patient Profile     69 y.o. male with history of gastric ulcers and anemia admitted with choledocholithiasis and now status post cholecystectomy complicated by peritonitis/sepsis being followed for atrial fibrillation.  Echocardiogram this admission showed hyperdynamic LV function.  Assessment & Plan    1 persistent atrial fibrillation-patient remains in atrial fibrillation and heart rate elevated at times.  His sedation was increased and his heart rate is now in the 70s.  Can continue digoxin; will decrease to 0.125 mg daily.  If heart rate  increases despite adequate sedation can add low-dose  Cardizem or metoprolol as blood pressure allows.  If blood pressure will not allow calcium blocker or beta-blocker then would add amiodarone.  Continue heparin.  Note his LV function is normal.  Can consider cardioversion in the future if atrial fibrillation persists once he recovers from present illness.  2 sepsis/peritonitis-continue antibiotics.  Management per critical care medicine and general surgery.  3 anasarca-patient is being diuresed.  Follow renal function.  His albumin is 1 likely contributing to his anasarca.  For questions or updates, please contact [place] Please consult [email or web address] for contact info under        Signed, Kirk Ruths, MD  03/30/2020, 9:41 AM

## 2020-03-30 NOTE — Progress Notes (Signed)
Called to patient room due to tachypnea, tachycardia, and decreasing SpO2. I was concerned that increasing sedation would drop an already soft BP, so I contacted Dr. Chestine Spore who provided VO for levophed to allow for increased sedation.

## 2020-03-30 NOTE — Progress Notes (Signed)
Referring Physician(s): Dr Nydia Bouton  Supervising Physician: Markus Daft  Patient Status:  [facility] - In-pt  Chief Complaint:    Subjective: Sedated on vent Biloma drain - IR 03/21/20 ERCP with stent and lithotripsy 12/21 OR for washout and additional drain by surgery 12/23  Allergies: Other and Demerol [meperidine]  Medications:  Current Facility-Administered Medications:  .  0.9 %  sodium chloride infusion, 250 mL, Intravenous, Continuous, Blain Pais, MD, Stopped at 03/30/20 [phone] .  acetaminophen (TYLENOL) tablet 650 mg, 650 mg, Per Tube, Q6H PRN **OR** acetaminophen (TYLENOL) suppository 650 mg, 650 mg, Rectal, Q6H PRN, Joselyn Glassman A, RPH, 650 mg at 03/28/20 1554 .  chlorhexidine gluconate (MEDLINE KIT) (PERIDEX) 0.12 % solution 15 mL, 15 mL, Mouth Rinse, BID, Adele Barthel D, MD, 15 mL at 03/30/20 0818 .  Chlorhexidine Gluconate Cloth 2 % PADS 6 each, 6 each, Topical, Daily, Elouise Munroe, MD, 6 each at 03/30/20 0230 .  dexmedetomidine (PRECEDEX) 400 MCG/100ML (4 mcg/mL) infusion, 0.4-1.2 mcg/kg/hr, Intravenous, Titrated, Adele Barthel D, MD, Last Rate: 17.48 mL/hr at 03/30/20 1100, 1 mcg/kg/hr at 03/30/20 1100 .  [COMPLETED] digoxin (LANOXIN) 0.25 MG/ML injection 0.5 mg, 0.5 mg, Intravenous, Once, 0.5 mg at 03/10/2020 1135 **FOLLOWED BY** [COMPLETED] digoxin (LANOXIN) 0.25 MG/ML injection 0.25 mg, 0.25 mg, Intravenous, Once, 0.25 mg at 03/28/20 1058 **FOLLOWED BY** digoxin (LANOXIN) 0.25 MG/ML injection 0.25 mg, 0.25 mg, Intravenous, Daily, Simaan, Elizabeth S, PA-C, 0.25 mg at 03/30/20 0901 .  fentaNYL (SUBLIMAZE) bolus via infusion 25 mcg, 25 mcg, Intravenous, Q15 min PRN, Erick Colace, NP, 25 mcg at 03/30/20 0708 .  fentaNYL (SUBLIMAZE) injection 25-100 mcg, 25-100 mcg, Intravenous, Q2H PRN, Frederik Pear, MD, 100 mcg at 03/30/20 0246 .  fentaNYL 2525mg in NS 2564m(1082mml) infusion-PREMIX, 25-200 mcg/hr, Intravenous, Continuous, BabErick ColaceP, Last Rate: 20 mL/hr at 03/30/20 1100, 200 mcg/hr at 03/30/20 1100 .  furosemide (LASIX) injection 80 mg, 80 mg, Intravenous, Q8H, Clark, Laura P, DO .  heparin ADULT infusion 100 units/mL (25000 units/250m48m2,700 Units/hr, Intravenous, Continuous, BrykLaren EvertsH, Last Rate: 27 mL/hr at 03/30/20 1100, 2,700 Units/hr at 03/30/20 1100 .  insulin aspart (novoLOG) injection 0-9 Units, 0-9 Units, Subcutaneous, Q6H, [place], JennDonalynn FurlongH [place] glargine (LANTUS) injection 5 Units, 5 Units, Subcutaneous, Daily, ClarJulian Hy, 5 Units at 03/30/20 0951 .  lidocaine (LIDODERM) 5 % 1 patch, 1 patch, Transdermal, Q24H, Simaan, ElizDarci Current-C, 1 patch at 03/29/20 1424 .  MEDLINE mouth rinse, 15 mL, Mouth Rinse, 10 times per day, KennAdele BarthelMD, 15 mL at 03/30/20 0911 .  meropenem (MERREM) 1 g in sodium chloride 0.9 % 100 mL IVPB, 1 g, Intravenous, Q8H, OrieBertis RuddyH, Stopped at 03/30/20 [id number] (PROTONIX) injection 40 mg, 40 mg, Intravenous, Q12H, Simaan, Elizabeth S, PA-C, 40 mg at 03/30/20 0901 .  phenylephrine CONCENTRATED 100mg4msodium chloride 0.9% 250mL 31mmg/mL35mnfusion, 0-200 mcg/min, Intravenous, Titrated, Pham, Minh Q, RPH-CPP, Stopped at 03/28/20 0632 . 9678yethylene glycol (MIRALAX / GLYCOLAX) packet 17 g, 17 g, Per Tube, Daily, KennerlAdele Barthel 17 g at 03/30/20 0901 .  potassium PHOSPHATE 15 mmol in dextrose 5 % 250 mL infusion, 15 mmol, Intravenous, Once, [place],Alvira PhilipsLaEl Paso Surgery Centers LPRate: 43 mL/hr at 03/30/20 1100, Infusion Verify at 03/30/20 1100 .  propofol (DIPRIVAN) 1000 MG/100ML infusion, 5-50 mcg/kg/min (Ideal), Intravenous, Titrated, Clark, Julian Hyast Rate: 3.79 mL/hr at 03/30/20  1100, 10 mcg/kg/min at 03/30/20 1100 .  senna-docusate (Senokot-S) tablet 2 tablet, 2 tablet, Per Tube, BID, Joselyn Glassman A, RPH, 2 tablet at 03/30/20 0901 .  sodium chloride flush (NS) 0.9 % injection 10-40 mL, 10-40 mL,  Intracatheter, Q12H, Cherlynn Kaiser A, MD, 10 mL at 03/30/20 0910 .  sodium chloride flush (NS) 0.9 % injection 10-40 mL, 10-40 mL, Intracatheter, PRN, Cherlynn Kaiser A, MD .  sodium chloride flush (NS) 0.9 % injection 5 mL, 5 mL, Intracatheter, Q8H, Adele Barthel D, MD, 5 mL at 03/30/20 0546 .  TPN ADULT (ION), , Intravenous, Continuous TPN, Alvira Philips, Melvin, Last Rate: 75 mL/hr at 03/30/20 1100, Infusion Verify at 03/30/20 1100 .  TPN ADULT (ION), , Intravenous, Continuous TPN, Alvira Philips, [place] .  vancomycin (VANCOREADY) IVPB 750 mg/150 mL, 750 mg, Intravenous, Q12H, Bertis Ruddy, RPH, Stopped at 03/30/20 8416    Vital Signs: BP 96/62   Pulse 94   Temp 99.32 F (37.4 C)   Resp (!) 30   Ht 5' 5.75" (1.67 m)   Wt 69.9 kg   SpO2 93%   BMI 25.05 kg/m   Physical Exam Abdominal:     General: Abdomen is flat.     Palpations: Abdomen is soft.  Skin:    General: Skin is warm.     Comments: Site of biloma drain is c/d/i OP thin pale yellow bile     Imaging: DG Chest Port 1 View  Result Date: 03/30/2020 CLINICAL DATA:  ARDS. EXAM: PORTABLE CHEST 1 VIEW COMPARISON:  Multiple recent chest x-rays. The most recent is 03/29/2020 FINDINGS: The endotracheal tube, NG tube and right PICC line are stable. The cardiac silhouette, mediastinal and hilar contours are within normal limits and unchanged. Persistent bilateral pulmonary infiltrates. No pleural effusions. Stable right upper quadrant drainage catheter. IMPRESSION: 1. Stable support apparatus. 2. Persistent bilateral pulmonary infiltrates. Electronically Signed   By: Marijo Sanes M.D.   On: 03/30/2020 07:23   DG CHEST PORT 1 VIEW  Result Date: 03/29/2020 CLINICAL DATA:  ARDS. EXAM: PORTABLE CHEST 1 VIEW COMPARISON:  03/28/2020 FINDINGS: 0621 hours. Endotracheal tube tip is 3.9 cm above the base of the carina. The NG tube passes into the stomach although the distal tip position is not included on the film. Right  PICC line tip overlies the proximal to mid SVC level given leftward patient rotation. Bibasilar airspace disease again noted without substantial pleural effusion. Nodular appearance in the right lung has decreased in the interval. Pigtail catheter overlying the right upper quadrant is again noted. IMPRESSION: 1. Interval decrease in nodular appearance in the right lung. 2. Persistent bibasilar airspace disease, not substantially changed in the interval. Electronically Signed   By: Misty Stanley M.D.   On: 03/29/2020 07:09   DG Chest Port 1 View  Result Date: 03/28/2020 CLINICAL DATA:  Respiratory distress. EXAM: PORTABLE CHEST 1 VIEW COMPARISON:  03/27/2020 FINDINGS: 0523 hours. Endotracheal tube tip is 5.1 cm above the base of the carina. Right PICC line tip overlies the distal SVC level. NG tube tip is in the stomach. Pigtail catheter overlies the lateral right upper quadrant. Retrocardiac collapse/consolidation is similar to minimally improved in the interval. Patchy/nodular airspace disease in the right lung is more prominent than before. IMPRESSION: 1. Interval improvement in retrocardiac collapse/consolidation. 2. Interval increase in patchy/nodular airspace disease in the right lung. Electronically Signed   By: Misty Stanley M.D.   On: 03/28/2020 07:03   DG Chest [facility] [address]  Result Date: 03/27/2020 CLINICAL DATA:  ARDS, intubation EXAM: PORTABLE CHEST 1 VIEW COMPARISON:  Portable exam 0826 hours compared to 03/11/2020 FINDINGS: Tip of endotracheal tube projects 3.9 cm above carina. Nasogastric tube extends into stomach. RIGHT arm PICC line tip projects over cavoatrial junction. Pigtail RIGHT thoracostomy tube at lung base. Enlargement of cardiac silhouette. Mediastinal contours and pulmonary vascularity normal. Atherosclerotic calcification aorta. Atelectasis versus consolidation LEFT lower lobe. Abrupt cut off of LEFT lower lobe bronchus, could be due to mucous plugging or mass. Patchy infiltrate  LEFT upper lobe and throughout RIGHT lung. No pleural effusion or pneumothorax. IMPRESSION: Line and tube positions as above. Enlargement of cardiac silhouette with mild RIGHT basilar atelectasis and persistent atelectasis versus consolidation of LEFT lower lobe. Abrupt cut off of LEFT lower lobe bronchus question mucous plugging or mass; recommend radiographic follow-up and if this fails to resolve CT imaging. Electronically Signed   By: Lavonia Dana M.D.   On: 03/27/2020 09:22   DG CHEST PORT 1 VIEW  Result Date: 03/16/2020 CLINICAL DATA:  PICC line placement, recent endoscopy and bile duct stent placement, previous abnormal chest x-ray EXAM: PORTABLE CHEST 1 VIEW COMPARISON:  03/22/2020 at 5:19 p.m. FINDINGS: Single frontal view of the chest demonstrates endotracheal tube overlying tracheal air column tip just below thoracic inlet. Enteric catheter tip and side port project over the gastric body. Right-sided PICC tip projects over the atriocaval junction. Cardiac silhouette is stable. There is progressive bilateral consolidation, most pronounced within the left lower lobe. This may reflect a combination of atelectasis and airspace disease. Small bilateral pleural effusions. No pneumothorax. No acute bony abnormalities. Pigtail drain overlies the right upper quadrant. IMPRESSION: 1. Progressive bilateral lung consolidation consistent with airspace disease and/or atelectasis. 2. Trace bilateral pleural effusions. 3. Support devices as above. Electronically Signed   By: Randa Ngo M.D.   On: 03/11/2020 21:20   DG Chest Port 1 View  Result Date: 03/20/2020 CLINICAL DATA:  NG tube placement. EXAM: PORTABLE CHEST 1 VIEW COMPARISON:  03/23/2020 FINDINGS: Surgical clips projecting over the gastroesophageal junction. Nasogastric tube terminates at the body of the stomach. Right upper quadrant percutaneous drain. Midline trachea. Moderate cardiomegaly. No pleural effusion or pneumothorax. No congestive  failure. Improved right base airspace disease. Persistent left base airspace disease. Suspect inferior right upper lobe subtle airspace opacity. IMPRESSION: Nasogastric tube terminating at the body of the stomach. Cardiomegaly without congestive failure. Improved right and similar left base atelectasis. Possible developing right upper lobe airspace disease. Consider radiographic follow-up to exclude pneumonia or aspiration. Electronically Signed   By: Abigail Miyamoto M.D.   On: 03/29/2020 17:39    Labs:  CBC: Recent Labs    03/27/20 0028 03/27/20 0852 03/28/20 0512 03/28/20 1532 03/29/20 0445 03/29/20 1123 03/30/20 0452  WBC 17.4*  --  22.8*  --  12.5*  --  9.1  HGB 11.4*   < > 9.2* 8.8* 8.5* 8.5* 8.7*  HCT 35.6*   < > 28.9* 26.0* 26.8* 25.0* 27.0*  PLT 370  --  202  --  120*  --  117*   < > = values in this interval not displayed.    COAGS: No results for input(s): INR, APTT in the last 8760 hours.  BMP: Recent Labs    03/27/20 0028 03/27/20 0852 03/28/20 0512 03/28/20 1532 03/29/20 0445 03/29/20 1123 03/30/20 0452  NA 134*   < > 136 138 138 141 141  K 4.2   < > 4.1 4.1 4.0 4.2 4.1  CL 104  --  103  --  104  --  104  CO2 14*  --  21*  --  26  --  28  GLUCOSE 142*  --  171*  --  178*  --  199*  BUN 19  --  28*  --  35*  --  37*  CALCIUM 7.0*  --  6.2*  --  6.3*  --  6.9*  CREATININE 1.01  --  1.19  --  1.13  --  1.02  GFRNONAA >60  --  >60  --  >60  --  >60   < > = values in this interval not displayed.    LIVER FUNCTION TESTS: Recent Labs    03/09/2020 0216 03/27/20 0028 03/29/20 0445 03/30/20 0452  BILITOT 2.7* 4.2* 1.8* 2.2*  AST 121* 62* 182* 93*  ALT 97* 64* 187* 128*  ALKPHOS 75 48 76 68  PROT 4.7* 4.1* 3.9* 4.1*  ALBUMIN 1.6* 1.8* 1.1* 1.0*    Assessment and Plan: Biloma drain intact Will follow OP still significant  Electronically Signed: Ascencion Dike, PA-C 03/30/2020, 11:05 AM   I spent a total of 15 Minutes at the the patient's bedside AND  on the patient's hospital floor or unit, greater than 50% of which was counseling/coordinating care for biloma drain

## 2020-03-30 NOTE — Progress Notes (Signed)
PT Cancellation Note  Patient Details Name: Jorge Evans. MRN: [id number] DOB: 1950/08/30   Cancelled Treatment:    Reason Eval/Treat Not Completed: Patient's level of consciousness. Pt with decreased responsiveness today, and after discussion with RN, PT will hold for today and attempt at later time as appropriate.   Jorge Evans, PT, DPT   Acute Rehabilitation Department Pager #: [phone]    Jorge Evans 03/30/2020, 5:52 PM

## 2020-03-30 NOTE — Progress Notes (Signed)
.   General Surgery Follow Up Note  Subjective:    Overnight Issues:   Objective:  Vital signs for last 24 hours: Temp:  [98.24 F (36.8 C)-100.4 F (38 C)] 99.14 F (37.3 C) (12/27 0800) Pulse Rate:  [88-133] 105 (12/27 0800) Resp:  [16-38] 16 (12/27 0800) BP: (90-140)/(25-92) 106/58 (12/27 0800) SpO2:  [92 %-99 %] 94 % (12/27 0800) FiO2 (%):  [55 %-70 %] 55 % (12/27 0741)  Hemodynamic parameters for last 24 hours:    Intake/Output from previous day: 12/26 0701 - 12/27 0700 In: 4037.9 [I.V.:3564.7; IV Piggyback:473.2] Out: 2440 [Urine:2125; Drains:315]  Intake/Output this shift: Total I/O In: 393.9 [I.V.:304.7; IV Piggyback:89.2] Out: 400 [Urine:400]  Vent settings for last 24 hours: Vent Mode: PRVC FiO2 (%):  [55 %-70 %] 55 % Set Rate:  [32 bmp] 32 bmp Vt Set:  [380 mL] 380 mL PEEP:  [16 cmH20] 16 cmH20 Plateau Pressure:  [25 cmH20-29 cmH20] 25 cmH20  Physical Exam:  Gen: comfortable, no distress Neuro:  does not follow commands HEENT: intubated Neck: supple CV: RRR Pulm: unlabored breathing, mechanically ventilated Abd: soft, nontender, distended, drains functional. IR output bile tinged, surgical drain frank bile- total output about 300 yesterday GU: clear, yellow urine, net +1.8L in 24h Extr: wwp, 3+ edema   Results for orders placed or performed during the hospital encounter of 03/17/2020 (from the past 24 hour(s))  I-STAT 7, (LYTES, BLD GAS, ICA, H+H)     Status: Abnormal   Collection Time: 03/29/20 11:23 AM  Result Value Ref Range   pH, Arterial 7.424 7.350 - 7.450   pCO2 arterial 46.0 32.0 - 48.0 mmHg   pO2, Arterial 82 (L) 83.0 - 108.0 mmHg   Bicarbonate 29.8 (H) 20.0 - 28.0 mmol/L   TCO2 31 22 - 32 mmol/L   O2 Saturation 96.0 %   Acid-Base Excess 5.0 (H) 0.0 - 2.0 mmol/L   Sodium 141 135 - 145 mmol/L   Potassium 4.2 3.5 - 5.1 mmol/L   Calcium, Ion 1.03 (L) 1.15 - 1.40 mmol/L   HCT 25.0 (L) 39.0 - 52.0 %   Hemoglobin 8.5 (L) 13.0 - 17.0 g/dL    Patient temperature 100.4 F    Collection site art line    Drawn by RT    Sample type ARTERIAL   Glucose, capillary     Status: Abnormal   Collection Time: 03/29/20 11:51 AM  Result Value Ref Range   Glucose-Capillary 167 (H) 70 - 99 mg/dL  Glucose, capillary     Status: Abnormal   Collection Time: 03/29/20  5:55 PM  Result Value Ref Range   Glucose-Capillary 194 (H) 70 - 99 mg/dL  Glucose, capillary     Status: Abnormal   Collection Time: 03/29/20 11:58 PM  Result Value Ref Range   Glucose-Capillary 169 (H) 70 - 99 mg/dL  Heparin level (unfractionated)     Status: Abnormal   Collection Time: 03/30/20  4:52 AM  Result Value Ref Range   Heparin Unfractionated 0.25 (L) 0.30 - 0.70 IU/mL  Comprehensive metabolic panel     Status: Abnormal   Collection Time: 03/30/20  4:52 AM  Result Value Ref Range   Sodium 141 135 - 145 mmol/L   Potassium 4.1 3.5 - 5.1 mmol/L   Chloride 104 98 - 111 mmol/L   CO2 28 22 - 32 mmol/L   Glucose, Bld 199 (H) 70 - 99 mg/dL   BUN 37 (H) 8 - 23 mg/dL   Creatinine, Ser 3.38  0.61 - 1.24 mg/dL   Calcium 6.9 (L) 8.9 - 10.3 mg/dL   Total Protein 4.1 (L) 6.5 - 8.1 g/dL   Albumin 1.0 (L) 3.5 - 5.0 g/dL   AST 93 (H) 15 - 41 U/L   ALT 128 (H) 0 - 44 U/L   Alkaline Phosphatase 68 38 - 126 U/L   Total Bilirubin 2.2 (H) 0.3 - 1.2 mg/dL   GFR, Estimated >78 >29 mL/min   Anion gap 9 5 - 15  Magnesium     Status: None   Collection Time: 03/30/20  4:52 AM  Result Value Ref Range   Magnesium 2.2 1.7 - 2.4 mg/dL  Phosphorus     Status: Abnormal   Collection Time: 03/30/20  4:52 AM  Result Value Ref Range   Phosphorus 2.1 (L) 2.5 - 4.6 mg/dL  CBC     Status: Abnormal   Collection Time: 03/30/20  4:52 AM  Result Value Ref Range   WBC 9.1 4.0 - 10.5 K/uL   RBC 2.98 (L) 4.22 - 5.81 MIL/uL   Hemoglobin 8.7 (L) 13.0 - 17.0 g/dL   HCT 56.2 (L) 13.0 - 86.5 %   MCV 90.6 80.0 - 100.0 fL   MCH 29.2 26.0 - 34.0 pg   MCHC 32.2 30.0 - 36.0 g/dL   RDW 78.4 69.6 -  29.5 %   Platelets 117 (L) 150 - 400 K/uL   nRBC 0.4 (H) 0.0 - 0.2 %  Differential     Status: Abnormal   Collection Time: 03/30/20  4:52 AM  Result Value Ref Range   Neutrophils Relative % 88 %   Neutro Abs 7.9 (H) 1.7 - 7.7 K/uL   Lymphocytes Relative 4 %   Lymphs Abs 0.4 (L) 0.7 - 4.0 K/uL   Monocytes Relative 3 %   Monocytes Absolute 0.3 0.1 - 1.0 K/uL   Eosinophils Relative 0 %   Eosinophils Absolute 0.0 0.0 - 0.5 K/uL   Basophils Relative 0 %   Basophils Absolute 0.0 0.0 - 0.1 K/uL   WBC Morphology DOHLE BODIES    Immature Granulocytes 5 %   Abs Immature Granulocytes 0.49 (H) 0.00 - 0.07 K/uL   Polychromasia PRESENT   Triglycerides     Status: None   Collection Time: 03/30/20  4:52 AM  Result Value Ref Range   Triglycerides 94 <150 mg/dL  Prealbumin     Status: Abnormal   Collection Time: 03/30/20  4:52 AM  Result Value Ref Range   Prealbumin <5 (L) 18 - 38 mg/dL  Glucose, capillary     Status: Abnormal   Collection Time: 03/30/20  5:28 AM  Result Value Ref Range   Glucose-Capillary 157 (H) 70 - 99 mg/dL   Comment 1 QC Due     Assessment & Plan:   Present on Admission: . Choledocholithiasis . GERD (gastroesophageal reflux disease) . Iron deficiency anemia    LOS: 17 days   Additional comments:I reviewed the patient's new clinical lab test results.   and I reviewed the patients new imaging test results.    GERD IDA PMH Bilroth II for bleeding gastric ulcers ABL anemia - relatively stable.   A. Fib with RVR - on heparin and diltiazem gtts  Symptomatic cholelithiasis Choledocholithiasis without evidence of acute cholecystitis  S/p ERCP, placement of CBD stent 12/10 [name] -unable to perform a sphincterotomy and stone extraction due to anatomy s/p Billroth II S/plaparoscopic cholecystectomy12/16 Dr. Janee Morn Postoperative bile leak - POD#11\4 - CT scan 12/17 revealed  moderate volumeperihepatic free fluid containing foci of  air with non organized fluid and air in the gallbladder fossaconcerning for biloma - s/p IR drain placement 12/18 with yield high volume bilious fluid, culture pending -s/p ERCP with sphincterotomy/sphincteroplasty, stent placement and lithotripsy 12/21 by Dr. Meridee Score - Tbili 2.2, essentially stable.   - drain output remains bilious, moderate to low volume bile leak             - OR for laparoscopic exploration due to increased free air on Ct 12/23, washout and drain placement 12/23 by Dr. Corliss Skains, had new drain placed in perihepatic region which was undrained by IR drain.    VDRF: appears to be in moderate ARDs. Per CCM.  ID: zosyn 12/18>>at least 12/27, aspirated on induction in OR 12/23, monitor clinically, already on zosyn FEN:IVF,TPN, anticipated ileus, but can trial trickle TF when passing flatus/BM (not yet) VTE: SCDs,heparin gtt   Monitor drain output/volume. Not much more to be done from a procedural standpoint.    Berna Bue MD Trauma & General Surgery Please use [email or web address] to contact on call provider  03/30/2020  *Care during the described time interval was provided by me. I have reviewed this patient's available data, including medical history, events of note, physical examination and test results as part of my evaluation.

## 2020-03-30 NOTE — Progress Notes (Signed)
NAME:  Azim Gillingham., MRN:  [id number], DOB:  1950-07-22, LOS: 13 ADMISSION DATE:  04/01/2020, CONSULTATION DATE:  03/29/2020 REFERRING MD:  Dr. Garald Braver, CHIEF COMPLAINT:  Respiratory failure    Brief History:  69 year old male presented originally 12/9 with complaints of abdominal pain, nausea, and vomiting, imaging concerning for choledocholithiasis underwent ERCP with stent placement later followed with cholecystectomy.  Overnight 12/23 patient developed worsening abdominal pain patient was taken for diagnostic laparoscopy with placement of intra-abdominal drain.  She remained intubated postoperatively due to respiratory compromise including questionable aspiration   Past Medical History   Past Medical History:  Diagnosis Date  . Anemia   . GERD (gastroesophageal reflux disease)     Significant Hospital Events   -ercp with cbd stent 12/10  -Cholecystectomy 12/16  -IR drain for bile leak 12/18  -ercp and stent placement 12/21 -diagnostic lap 12/23 and intra-abdominal drain, A. fib with RVR requiring diltiazem, septic shock requiring phenylephrine 12/24: More hypoxic requiring, chest x-ray showing bilateral airspace disease worrisome for evolving ARDS, placed on ARDS protocol, added fentanyl infusion.  Metabolic acidosis,, with ongoing septic shock.  Off calcium channel blocker.  Titrating phenylephrine.  Additional volume resuscitation.  Had bronchoscopy.,  Had significant clear bilious aspirate which was lavaged.  Placed on bicarbonate drip for ongoing acidosis 12/25: Acid-base improved.  CVP now 23, off pressors.  One-time Lasix.  Trying to minimize fluid administration.  Still on high PEEP/FiO2 spiking fever in excess of 103, change antibiotic coverage to meropenem and vancomycin.12/26: Hemodynamically stable overnight, no fever spike, white blood cell count significantly improved.  Consults:  GI Surgery IR [facility] cardiology  Procedures:  Endotracheal tube  12/23  Significant Diagnostic Tests:  12/9 ct abd/pelvis: 1. Interval development of moderate intra and extrahepatic biliary dilatation with multiple rounded filling defects within the mid to distal common duct, suspicious for choledocholithiasis. Suggest correlation with LFTs, further evaluation with MRCP may be obtained. 2. Streaky hyperdense foci within the bladder, suspect that this is due to excreted contrast as opposed to bladder mass. 3. Status post partial gastrectomy with no evidence for an obstruction. 4. Aortic atherosclerosis.  12/17 ct abd/pelvis: 1. Interval cholecystectomy. Moderate volume of perihepatic free fluid containing foci of air with non organized fluid and air in the gallbladder fossa. Given the amount of fluid, biloma is favored over postoperative seroma. Sterility is indeterminate by imaging. Consider nuclear medicine hepatic biliary scan to assess for bile leak. 2. Plastic common bile duct stent in place. Suspected residual 9 mm stone at the proximal aspect of the stent. Common bile duct dilatation of 16 mm, with improved but persistent intrahepatic biliary dilatation on the left. Pneumobilia related to recent procedure. 3. Small right and trace left pleural effusions. 4. Absent renal excretion on delayed phase imaging, can be seen with renal dysfunction. 5. Mild distal colonic diverticulosis without diverticulitis. 12/20 echo: LVEF 65-70% RV normal 12/22 ct abdomen pelvis: 1. Moderate amount of intra-abdominal free air, increased in severity when compared to the prior study. While this is likely secondary to the patient's recent surgical intervention, the presence of a perforated hollow viscus cannot completely be excluded. 2. Additional findings consistent with a postoperative ileus. 3. Right-sided surgical drain positioning, as described above, with near-total resolution of the perihepatic fluid seen on the prior study. 4. Interval distal  pancreatic duct stent placement since the prior exam. 5. Moderate severity anasarca. 6. Small hiatal hernia. 7. Aortic atherosclerosis.  Micro Data:  12/10 sars2: neg 12/18 perihepatic  biloma: Multiple organisms present, none predominant, no anaerobes isolated.  Antimicrobials:  Zosyn 12/17-> 12/25 Vancomycin 12/ 23>> Meropenem 12/25>>> Interim history/subjective:  Agitated overnight with Afib with RVR  Objective   Blood pressure 104/68, pulse (!) 133, temperature 98.24 F (36.8 C), resp. rate (!) 34, height 5' 5.75" (1.67 m), weight 69.9 kg, SpO2 98 %.    Vent Mode: PRVC FiO2 (%):  [55 %-90 %] 55 % Set Rate:  [32 bmp] 32 bmp Vt Set:  [380 mL] 380 mL PEEP:  [16 cmH20] 16 cmH20 Plateau Pressure:  [25 cmH20-29 cmH20] 25 cmH20   Intake/Output Summary (Last 24 hours) at 03/30/2020 0757 Last data filed at 03/30/2020 0700 Gross per 24 hour  Intake 4037.85 ml  Output 2440 ml  Net 1597.85 ml   Filed Weights   04/01/2020 1442 03/07/2020 1441  Weight: 69.9 kg 69.9 kg    Examination:  General: critically ill appearing man laying in bed in NAD, intubated and sedated/ HEENT: [place]/AT, eyes anicteric. ETT in place.  Resp: mild vent dyssynchrony, peak pressures <30, Pplat in low 20s on PEEP 16. Faint rhonchi bilaterally. Thin ETT secretions. Cardiac: tachycardic, irreg rhythm, afib on tele. Abdomen: well healed laparoscopy incisions. Soft, ND to palpation. Biliary drain remains in place. Neuro: RASS -4, PERRL, dyssynchronous with MV Extremities: diffuse anasarca, no cyanosis GU: severe scrotal and penile edema  Resolved Hospital Problem list   Septic shock resolved Worsening lactic acidosis with anion gap metabolic acidosis. Assessment & Plan:  Acute choledocholithiasis, status post cholecystectomy complicated by bile leak and abdominal peritonitis 12/18 requiring stenting on 12/21, and subsequent return to the OR 12/23 with washout, lysis of adhesion, and new drain  placement Plan -Continue drain management per general surgery  -N.p.o. with NGT to LIWS -TPN   Severe sepsis secondary to abdominal peritonitis-->shock resolved as of 12/25.   -Preliminary surgical abscess culture: Multiple organisms present/polymicrobial Plan -Continue wound care. Drain remains in place. -Con't meropenem & vanc  Severe anasarca following aggressive volume resuscitation Plan -increasing Lasix; goal net negetive fluid balance  Acute Hypoxic respiratory failure 2/2 aspiration PNA and ARDS Plan -Con't LTVV, 4-8cc/kg IBW with goal Pplat <30 and DP <15- meeting goals -VAP prevention protocol -Needs aggressive diuresis for goal net negative fluid balance. May need lasix drip if not responding. -daily SAT & SBT when appropriate; currently vent requirements too high for weaning. Needs more sedation to tolerate MV.  -Titrate PEEP & FiO2 per ARDS protocol, permissive hypercapnia. -VAP prevention protocol  Afib with RVR; new- onset this admission  Plan -improve sedation -may need to add Bblockers if he can tolerate once sedation optimized. Amiodarone may be required, but sedation should be optimized first. -con't heparin gtt -con't digoxin  Anemia due to critical illness -con't to monitor -transfuse for Hb <7 or hemodynamically significant bleeding  Hyperglycemia, uncontrolled -adding lantus 5 units daily -con't SSI PRN -goal BG 140-180  Moderate protein energy malnutrition, undetectable pre-albumin -con't TPN   Best practice:  Diet: npo TNA per surg Pain/Anxiety/Delirium protocol (if indicated): per protocol VAP protocol (if indicated): per protocol DVT prophylaxis:  IV heparin  GI prophylaxis: ppi Glucose control: basal bolus insulin Mobility: bedrest Code Status: FULL Family Communication: disposition: ICU Goals of care last addressed 12/25 with daughter   This patient is critically ill with multiple organ system failure which requires frequent high  complexity decision making, assessment, support, evaluation, and titration of therapies. This was completed through the application of advanced monitoring technologies and extensive interpretation of  multiple databases. During this encounter critical care time was devoted to patient care services described in this note for 40 minutes.  Steffanie Dunn, DO 03/30/20 8:22 AM [facility]

## 2020-03-31 DIAGNOSIS — Z7189 Other specified counseling: Secondary | ICD-10-CM

## 2020-03-31 LAB — BASIC METABOLIC PANEL
Anion gap: 10 (ref 5–15)
BUN: 41 mg/dL — ABNORMAL HIGH (ref 8–23)
CO2: 28 mmol/L (ref 22–32)
Calcium: 6.5 mg/dL — ABNORMAL LOW (ref 8.9–10.3)
Chloride: 104 mmol/L (ref 98–111)
Creatinine, Ser: 1.02 mg/dL (ref 0.61–1.24)
GFR, Estimated: >60 mL/min (ref >60)
Glucose, Bld: 161 mg/dL — ABNORMAL HIGH (ref 70–99)
Potassium: 4 mmol/L (ref 3.5–5.1)
Sodium: 142 mmol/L (ref 135–145)

## 2020-03-31 LAB — PHOSPHORUS: Phosphorus: 3 mg/dL (ref 2.5–4.6)

## 2020-03-31 LAB — CBC
HCT: 28.9 % — ABNORMAL LOW (ref 39.0–52.0)
Hemoglobin: 8.7 g/dL — ABNORMAL LOW (ref 13.0–17.0)
MCH: 28 pg (ref 26.0–34.0)
MCHC: 30.1 g/dL (ref 30.0–36.0)
MCV: 92.9 fL (ref 80.0–100.0)
Platelets: 132 10*3/uL — ABNORMAL LOW (ref 150–400)
RBC: 3.11 MIL/uL — ABNORMAL LOW (ref 4.22–5.81)
RDW: 15 % (ref 11.5–15.5)
WBC: 11.7 10*3/uL — ABNORMAL HIGH (ref 4.0–10.5)
nRBC: 0.3 % — ABNORMAL HIGH (ref 0.0–0.2)

## 2020-03-31 LAB — GLUCOSE, CAPILLARY
Glucose-Capillary: 131 mg/dL — ABNORMAL HIGH (ref 70–99)
Glucose-Capillary: 150 mg/dL — ABNORMAL HIGH (ref 70–99)
Glucose-Capillary: 150 mg/dL — ABNORMAL HIGH (ref 70–99)

## 2020-03-31 LAB — CALCIUM, IONIZED: Calcium, Ionized, Serum: 3.8 mg/dL — ABNORMAL LOW (ref 4.5–5.6)

## 2020-03-31 MED ORDER — MIDAZOLAM HCL 2 MG/2ML IJ SOLN
1.0000 mg | INTRAMUSCULAR | Status: DC | PRN
  Administered 2020-03-31: 17:00:00 1 mg via INTRAVENOUS
  Filled 2020-03-31: qty 2

## 2020-03-31 MED ORDER — DOCUSATE SODIUM 50 MG/5ML PO LIQD
100.0000 mg | Freq: Two times a day (BID) | ORAL | Status: DC
  Administered 2020-03-31 – 2020-04-03 (×7): 100 mg
  Filled 2020-03-31 (×7): qty 10

## 2020-03-31 MED ORDER — TRACE MINERALS CU-MN-SE-ZN 300-55-60-3000 MCG/ML IV SOLN
INTRAVENOUS | Status: AC
  Filled 2020-03-31: qty 708

## 2020-03-31 MED ORDER — FUROSEMIDE 10 MG/ML IJ SOLN
80.0000 mg | Freq: Once | INTRAMUSCULAR | Status: AC
  Administered 2020-03-31: 10:00:00 80 mg via INTRAVENOUS
  Filled 2020-03-31: qty 8

## 2020-03-31 MED ORDER — MIDAZOLAM HCL 2 MG/2ML IJ SOLN: 1.0000 mg | INTRAMUSCULAR | Status: DC | PRN

## 2020-03-31 MED ORDER — WHITE PETROLATUM EX OINT
TOPICAL_OINTMENT | CUTANEOUS | Status: AC
  Filled 2020-03-31: qty 28.35

## 2020-03-31 MED ORDER — FUROSEMIDE 10 MG/ML IJ SOLN
10.0000 mg/h | INTRAVENOUS | Status: DC
  Administered 2020-03-31 – 2020-04-01 (×2): 8 mg/h via INTRAVENOUS
  Administered 2020-04-02 – 2020-04-03 (×3): 10 mg/h via INTRAVENOUS
  Filled 2020-03-31 (×7): qty 20

## 2020-03-31 MED ORDER — DIGOXIN 0.25 MG/ML IJ SOLN
0.1250 mg | Freq: Every day | INTRAMUSCULAR | Status: DC
  Administered 2020-03-31 – 2020-04-03 (×4): 0.125 mg via INTRAVENOUS
  Filled 2020-03-31 (×4): qty 2

## 2020-03-31 NOTE — Progress Notes (Signed)
PT Cancellation Note  Patient Details Name: Jorge Evans. MRN: [id number] DOB: 1950/12/31   Cancelled Treatment:    Reason Eval/Treat Not Completed: Patient not medically ready. Pt requiring aggressive support to remain alive. Per RN, patients wife starting to talk about patients desires and not wanting these aggressive measures. Pt is now a DNR. PT to hold again today to allow for family meeting and decisions on goals of care.   Lewis Shock, PT, DPT Acute Rehabilitation Services Pager #: [phone] Office #: [phone]    Iona Hansen 03/31/2020, 9:58 AM

## 2020-03-31 NOTE — Progress Notes (Signed)
5 Days Post-Op  Subjective: Sedated on vent.  Events noted from today for DNR and RN states that will likely transition to comfort tomorrow.  ROS: See above, otherwise other systems negative  Objective: Vital signs in last 24 hours: Temp:  [98.06 F (36.7 C)-101.84 F (38.8 C)] 98.42 F (36.9 C) (12/28 1530) Pulse Rate:  [83-128] 99 (12/28 1530) Resp:  [19-40] 36 (12/28 1530) BP: (62-140)/(30-118) 76/54 (12/28 1530) SpO2:  [83 %-100 %] 88 % (12/28 1530) FiO2 (%):  [55 %-90 %] 55 % (12/28 1636) Last BM Date: 03/25/20  Intake/Output from previous day: 12/27 0701 - 12/28 0700 In: 4211.7 [I.V.:3357; IV Piggyback:854.7] Out: 2310 [Urine:1900; Emesis/NG output:150; Drains:260] Intake/Output this shift: Total I/O In: 1330.5 [I.V.:1180.5; IV Piggyback:150] Out: 2920 [Urine:2800; Drains:120]  PE: Abd: soft, but distended, IR drain with only about 10cc of bilious output today.  About 70cc of bilious output today from surgical drain.  Incisions are c/d/i.  Lab Results:  Recent Labs    03/30/20 0452 03/31/20 0500  WBC 9.1 11.7*  HGB 8.7* 8.7*  HCT 27.0* 28.9*  PLT 117* 132*   BMET Recent Labs    03/30/20 0452 03/31/20 0500  NA 141 142  K 4.1 4.0  CL 104 104  CO2 28 28  GLUCOSE 199* 161*  BUN 37* 41*  CREATININE 1.02 1.02  CALCIUM 6.9* 6.5*   PT/INR No results for input(s): LABPROT, INR in the last 72 hours. CMP     Component Value Date/Time   NA 142 03/31/2020 0500   K 4.0 03/31/2020 0500   CL 104 03/31/2020 0500   CO2 28 03/31/2020 0500   GLUCOSE 161 (H) 03/31/2020 0500   BUN 41 (H) 03/31/2020 0500   CREATININE 1.02 03/31/2020 0500   CREATININE 0.93 11/19/2019 0842   CALCIUM 6.5 (L) 03/31/2020 0500   PROT 4.1 (L) 03/30/2020 0452   ALBUMIN 1.0 (L) 03/30/2020 0452   AST 93 (H) 03/30/2020 0452   ALT 128 (H) 03/30/2020 0452   ALKPHOS 68 03/30/2020 0452   BILITOT 2.2 (H) 03/30/2020 0452   GFRNONAA >60 03/31/2020 0500   GFRAA >60 11/11/2016 1600    Lipase     Component Value Date/Time   LIPASE 20 April 08, 2020 1452       Studies/Results: DG Chest Port 1 View  Result Date: 03/30/2020 CLINICAL DATA:  ARDS. EXAM: PORTABLE CHEST 1 VIEW COMPARISON:  Multiple recent chest x-rays. The most recent is 03/29/2020 FINDINGS: The endotracheal tube, NG tube and right PICC line are stable. The cardiac silhouette, mediastinal and hilar contours are within normal limits and unchanged. Persistent bilateral pulmonary infiltrates. No pleural effusions. Stable right upper quadrant drainage catheter. IMPRESSION: 1. Stable support apparatus. 2. Persistent bilateral pulmonary infiltrates. Electronically Signed   By: Rudie Meyer M.D.   On: 03/30/2020 07:23    Anti-infectives: Anti-infectives (From admission, onward)   Start     Dose/Rate Route Frequency Ordered Stop   03/28/20 2200  meropenem (MERREM) 1 g in sodium chloride 0.9 % 100 mL IVPB        1 g 200 mL/hr over 30 Minutes Intravenous Every 8 hours 03/28/20 1809     03/28/20 1900  vancomycin (VANCOREADY) IVPB 750 mg/150 mL        750 mg 150 mL/hr over 60 Minutes Intravenous Every 12 hours 03/28/20 1809     03/28/20 0900  vancomycin (VANCOREADY) IVPB 1500 mg/300 mL  Status:  Discontinued        1,500 mg  150 mL/hr over 120 Minutes Intravenous Every 24 hours 03/27/20 0750 03/27/20 1028   03/27/20 1800  vancomycin (VANCOREADY) IVPB 1500 mg/300 mL  Status:  Discontinued        1,500 mg 150 mL/hr over 120 Minutes Intravenous Every 24 hours 03/31/2020 1732 03/27/20 0750   03/27/20 0830  vancomycin (VANCOREADY) IVPB 1750 mg/350 mL        1,750 mg 175 mL/hr over 120 Minutes Intravenous STAT 03/27/20 0750 03/27/20 1026   03/23/2020 1830  vancomycin (VANCOREADY) IVPB 1750 mg/350 mL  Status:  Discontinued        1,750 mg 175 mL/hr over 120 Minutes Intravenous  Once 03/27/2020 1732 03/27/20 0750   03/21/20 0200  piperacillin-tazobactam (ZOSYN) IVPB 3.375 g  Status:  Discontinued        3.375 g 12.5 mL/hr  over 240 Minutes Intravenous Every 8 hours 03/21/20 0007 03/28/20 1711   03/23/2020 0600  ceFAZolin (ANCEF) IVPB 2g/100 mL premix        2 g 200 mL/hr over 30 Minutes Intravenous On call to O.R. 03/18/20 1334 03/20/20 0559   03/04/2020 0600  piperacillin-tazobactam (ZOSYN) IVPB 3.375 g  Status:  Discontinued        3.375 g 12.5 mL/hr over 240 Minutes Intravenous Every 8 hours 03/27/2020 0046 03/15/20 1109   30-Mar-2020 2330  piperacillin-tazobactam (ZOSYN) IVPB 3.375 g        3.375 g 100 mL/hr over 30 Minutes Intravenous  Once 30-Mar-2020 2317 04/01/2020 0131       Assessment/Plan GERD IDA PMH Bilroth II for bleeding gastric ulcers ABL anemia -relatively stable. A. Fib with RVR - on heparin and diltiazem gtts  Symptomatic cholelithiasis Choledocholithiasis without evidence of acute cholecystitis  S/p ERCP, placement of CBD stent 12/10 [name] -unable to perform a sphincterotomy and stone extraction due to anatomy s/p Billroth II S/plaparoscopic cholecystectomy12/16 Dr. Janee Morn Postoperative bile leak - POD#12\5 - CT scan 12/17 revealed moderate volumeperihepatic free fluid containing foci of air with non organized fluid and air in the gallbladder fossaconcerning for biloma - s/p IR drain placement 12/18 with yield high volume bilious fluid, culture pending -s/p ERCP with sphincterotomy/sphincteroplasty, stent placement and lithotripsy 12/21 by Dr. Meridee Score - Tbili 2.2, essentially stable. - drain output remainsbilious, moderate to low volume bile leak - OR for laparoscopic exploration due to increased free air on Ct 12/23, washout and drain placement 12/23 by Dr. Corliss Skains, had new drain placed in perihepatic region which was undrained by IR drain.  -patient now DNR and likely transition to comfort care per RN.  VDRF: appears to be in moderate ARDs. Per CCM.  ID: zosyn 12/18>>at least 12/27,  aspirated on induction in OR 12/23, monitor clinically, already on zosyn FEN:IVF,TPN, anticipatedileus, but can trial trickle TF when passing flatus/BM(not yet) VTE: SCDs,heparin gtt    LOS: 18 days    Letha Cape , [facility] Surgery 03/31/2020, 4:48 PM Please see Amion for pager number during day hours 7:00am-4:30pm or 7:00am -11:30am on weekends

## 2020-03-31 NOTE — Progress Notes (Signed)
PHARMACY - TOTAL PARENTERAL NUTRITION CONSULT NOTE   Indication: severe malnutrition, anticipated ileus  Patient Measurements: Height: 5' 5.75" (167 cm) Weight: 69.9 kg (154 lb) IBW/kg (Calculated) : 63.22 TPN AdjBW (KG): 69.9 Body mass index is 25.05 kg/m. Usual Weight: 66 kg  Assessment:  69 yom who presented to the ED on 12/09 with abdominal pain and N/V. PMH for Bilroth II for bleeding gastric ulcers. He was found to have symptomatic cholelithiasis and is s/p ERCP with placement of CBD stent on 12/10 - unable to perform a sphincterotomy and stone extraction due to anatomy s/p Billroth II. He is s/p lap-chole on 12/16 which was complicated by a post-op bile leak. IR placed placed a drain on 12/18. Repeat ERCP with sphincterotomy/sphincteroplasty, stent placement and lithotripsy done on 12/21. Unable to advance diet due to nausea and hiccups and has been NPO or on clear liquids since 12/16. Pharmacy consulted for nutritional support for severe acute malnutrition and anticipated ileus.   Glucose / Insulin: no hx DM; CBGs mostly <180 last 24hr on TPN, used 6 units insulin/24 hr, Lantus 5 added Electrolytes: all WNL  Renal: SCr 1.02, BUN 37 LFTs / TGs: AST/ALT down 93/128, TG WNL, Tbili up 2.2 Prealbumin / albumin: albumin 1, prealbumin down <5 Intake / Output; MIVF: diuresing with Lasix drip, UOP 1.1 ml/kg/hr, net +23.8L. NGT to LIWS and 150 ml O/P, R abd drains O/P 230 ml GI Imaging: Surgeries / Procedures:  12/10 s/p ERCP with stent placement 12/16 s/p laparoscopic cholecystectomy 12/18 s/p CT guided drainage of perihepatic fluid collection and drain placement 12/21 s/p repeat ERCP 12/23 s/p diagnostic laparoscopy with placement of intra-abdominal drain  Central access: Triple lumen PICC placed 12/23 TPN start date: 03/06/2020  Nutritional Goals (per RD recommendation on 12/27): kCal: 1863, Protein: 105-120, Fluid: >1.8L Goal TPN rate is 75 mL/hr (provides 106 g of protein and  1893 kcals per day) - minimizing fluid, TPN adjusted for lower volume on 12/26 and updated based on RD rec on 12/28  Current Nutrition:  NPO and TPN  On propofol - attempting to wean off today  Plan:  -Continue concentrated TPN at goal rate 75 mL/hr at 1800  - provides 106 g of protein, 31 g lipid, 342 g dextrose, and 1893 kcal providing 100% of needs -Electrolytes in TPN - no changes 12/28: 40mEq/L of Na, 77mEq/L of Ca, 71mEq/L of Mg; 84mEq/L of K; 20 mmol/L of Phos. Continue max acetate. -Watch lytes closely with diuresis - replace outside of TPN -Add standard MVI and trace elements to TPN -Continue Sensitive q6h SSI and adjust as needed  -Monitor TPN labs   -F/U GOC discussion  Thank you for involving pharmacy in this patient's care.  Loura Back, PharmD, BCPS Clinical Pharmacist Clinical phone for 03/31/2020 until 3p is [phone] 03/31/2020 7:12 AM  **Pharmacist phone directory can be found on [email or web address] listed under [facility]**

## 2020-03-31 NOTE — Plan of Care (Signed)
I met with Ms. Markey this morning to answer questions and update her on her husband's care. She is concerned that he would not want the care he is receiving and would not want to continue. She is concerned about his long-term prognosis and knows he would not want a months-long recovery from critical illness. She is planning to call family to update them and plans to withdraw aggressive care in the coming days. She does not want to escalate care at this time nor does she want him to remain full code- she wants him to be DNR in the event of an arrest. Code status updated in epic. Bedside RN present during discussion. Liberalized family visitation for end of life care.  Julian Hy, DO 03/31/20 9:22 AM [facility]

## 2020-03-31 NOTE — Progress Notes (Signed)
NAME:  Jorge Evans., MRN:  [id number], DOB:  1950-07-22, LOS: 13 ADMISSION DATE:  04/01/2020, CONSULTATION DATE:  03/29/2020 REFERRING MD:  Dr. Garald Braver, CHIEF COMPLAINT:  Respiratory failure    Brief History:  69 year old male presented originally 12/9 with complaints of abdominal pain, nausea, and vomiting, imaging concerning for choledocholithiasis underwent ERCP with stent placement later followed with cholecystectomy.  Overnight 12/23 patient developed worsening abdominal pain patient was taken for diagnostic laparoscopy with placement of intra-abdominal drain.  She remained intubated postoperatively due to respiratory compromise including questionable aspiration   Past Medical History   Past Medical History:  Diagnosis Date  . Anemia   . GERD (gastroesophageal reflux disease)     Significant Hospital Events   -ercp with cbd stent 12/10  -Cholecystectomy 12/16  -IR drain for bile leak 12/18  -ercp and stent placement 12/21 -diagnostic lap 12/23 and intra-abdominal drain, A. fib with RVR requiring diltiazem, septic shock requiring phenylephrine 12/24: More hypoxic requiring, chest x-ray showing bilateral airspace disease worrisome for evolving ARDS, placed on ARDS protocol, added fentanyl infusion.  Metabolic acidosis,, with ongoing septic shock.  Off calcium channel blocker.  Titrating phenylephrine.  Additional volume resuscitation.  Had bronchoscopy.,  Had significant clear bilious aspirate which was lavaged.  Placed on bicarbonate drip for ongoing acidosis 12/25: Acid-base improved.  CVP now 23, off pressors.  One-time Lasix.  Trying to minimize fluid administration.  Still on high PEEP/FiO2 spiking fever in excess of 103, change antibiotic coverage to meropenem and vancomycin.12/26: Hemodynamically stable overnight, no fever spike, white blood cell count significantly improved.  Consults:  GI Surgery IR [facility] cardiology  Procedures:  Endotracheal tube  12/23  Significant Diagnostic Tests:  12/9 ct abd/pelvis: 1. Interval development of moderate intra and extrahepatic biliary dilatation with multiple rounded filling defects within the mid to distal common duct, suspicious for choledocholithiasis. Suggest correlation with LFTs, further evaluation with MRCP may be obtained. 2. Streaky hyperdense foci within the bladder, suspect that this is due to excreted contrast as opposed to bladder mass. 3. Status post partial gastrectomy with no evidence for an obstruction. 4. Aortic atherosclerosis.  12/17 ct abd/pelvis: 1. Interval cholecystectomy. Moderate volume of perihepatic free fluid containing foci of air with non organized fluid and air in the gallbladder fossa. Given the amount of fluid, biloma is favored over postoperative seroma. Sterility is indeterminate by imaging. Consider nuclear medicine hepatic biliary scan to assess for bile leak. 2. Plastic common bile duct stent in place. Suspected residual 9 mm stone at the proximal aspect of the stent. Common bile duct dilatation of 16 mm, with improved but persistent intrahepatic biliary dilatation on the left. Pneumobilia related to recent procedure. 3. Small right and trace left pleural effusions. 4. Absent renal excretion on delayed phase imaging, can be seen with renal dysfunction. 5. Mild distal colonic diverticulosis without diverticulitis. 12/20 echo: LVEF 65-70% RV normal 12/22 ct abdomen pelvis: 1. Moderate amount of intra-abdominal free air, increased in severity when compared to the prior study. While this is likely secondary to the patient's recent surgical intervention, the presence of a perforated hollow viscus cannot completely be excluded. 2. Additional findings consistent with a postoperative ileus. 3. Right-sided surgical drain positioning, as described above, with near-total resolution of the perihepatic fluid seen on the prior study. 4. Interval distal  pancreatic duct stent placement since the prior exam. 5. Moderate severity anasarca. 6. Small hiatal hernia. 7. Aortic atherosclerosis.  Micro Data:  12/10 sars2: neg 12/18 perihepatic  biloma: Multiple organisms present, none predominant, no anaerobes isolated.  Antimicrobials:  Zosyn 12/17-> 12/25 Vancomycin 12/ 23>> Meropenem 12/25>>> Interim history/subjective:  Required increased FiO2 overnight, continues to have high PEEP requirements. Required pressors overnight to tolerate sedation. Febrile overnight.  Objective   Blood pressure 133/75, pulse (!) 107, temperature (!) 100.94 F (38.3 C), resp. rate (!) 31, height 5' 5.75" (1.67 m), weight 69.9 kg, SpO2 100 %.    Vent Mode: PRVC FiO2 (%):  [55 %-90 %] 65 % Set Rate:  [32 bmp] 32 bmp Vt Set:  [380 mL] 380 mL PEEP:  [14 cmH20] 14 cmH20 Plateau Pressure:  [24 cmH20-26 cmH20] 26 cmH20   Intake/Output Summary (Last 24 hours) at 03/31/2020 0831 Last data filed at 03/31/2020 0800 Gross per 24 hour  Intake 4485.74 ml  Output 3160 ml  Net 1325.74 ml   Filed Weights   03/08/2020 1442 03/14/2020 1441  Weight: 69.9 kg 69.9 kg    Examination:  General: critically ill appearing man laying in bed in NAD, intubated and sedated but remains dyssynchronous with MV. HEENT: [place]/AT, eyes mildly icteric with scleral edema. Resp: vent dyssynchrony, Ppeak 25, Pplat 23, DP 9. No significant ETT secretions. CTA anteriorly, decreased basilar breath sounds. Cardiac: tachycardic, irreg rhythm Abdomen: soft, drain in place  Neuro: RASS -5, PERRL, remains dyssynchronous with MV, strong cough Extremities: severe diffuse anasarca, no cyanosis. GU: severe scrotal and penile edema persist Derm: pallor, no ecchymoses  Resolved Hospital Problem list   Septic shock resolved Worsening lactic acidosis with anion gap metabolic acidosis.  Assessment & Plan:  Acute choledocholithiasis, status post cholecystectomy complicated by bile leak and abdominal  peritonitis 12/18 requiring stenting on 12/21, and subsequent return to the OR 12/23 with washout, lysis of adhesion, and new drain placement Plan -Continue drain management per general surgery and IR -N.p.o. with NGT to LIWS -con't TPN until transitioning to comfort care  Severe sepsis secondary to abdominal peritonitis; back in shock on 12/27 likely due to sedation requirements. New/ recurrent fever overnight with increasing WBC. -Preliminary surgical abscess culture: Multiple organisms present/polymicrobial Plan -Continue wound care. Drain remains in place. -Con't empiric meropenem & vancomycin -Norepinephrine to maintain MAP >65. -Based on discussion with his wife, additional cultures and escalating further of antibiotics would not be consistent with his goals.  Severe anasarca following aggressive volume resuscitation Plan -Lasix drip; goal net negetive fluid balance.  Acute hypoxic respiratory failure 2/2 aspiration PNA and ARDS Plan -Con't LTVV, 4-8cc/kg IBW with goal Pplat <30 and DP <15- still meeting ARDS -still needing heavy sedation for lung protective ventilation -VAP prevention protocol -transition to lasix infusion -daily SAT & SBT when appropriate; currently vent requirements too high for weaning. Needs more sedation to tolerate MV.  -Titrate PEEP & FiO2 per ARDS protocol, permissive hypercapnia. -VAP prevention protocol  Afib with RVR; new- onset this admission  Plan -optimize sedation -may need to add Bblockers if he can tolerate once sedation optimized. Amiodarone may be required, but sedation should be optimized first. -con't lovenox -con't digoxin -appreciate cardiology's input  Anemia due to critical illness Thrombocytopenia, likely due to critical illness -con't to monitor -transfuse for Hb <7 or hemodynamically significant bleeding  Hyperglycemia, controlled -con't lantus 5 units daily -con't SSI PRN -goal BG 140-180  Moderate protein energy  malnutrition, undetectable pre-albumin Anasarca -con't TPN -diuresis  GoC -Discussed goals of care with wife at bedside-- see previous note. Code status changed to DNR.  Best practice:  Diet: npo TNA per surg  Pain/Anxiety/Delirium protocol (if indicated): per protocol VAP protocol (if indicated): per protocol DVT prophylaxis:  Iovenox GI prophylaxis: ppi Glucose control: basal bolus insulin Mobility: bedrest Code Status: FULL Family Communication: wife disposition: ICU Goals of care addressed 12/28 with wife, RN   This patient is critically ill with multiple organ system failure which requires frequent high complexity decision making, assessment, support, evaluation, and titration of therapies. This was completed through the application of advanced monitoring technologies and extensive interpretation of multiple databases. During this encounter critical care time was devoted to patient care services described in this note for 50 minutes.  Steffanie Dunn, DO 03/31/20 10:02 AM [place] [facility]

## 2020-03-31 NOTE — Progress Notes (Signed)
Asked to provide second opinion for [place].  Met with wife and sister (by phone) after extensively reviewing case. Essentially, patient has perforated viscus on TPN, extensive third spacing, ARDS with ventilator dependence.    We are fairly far into hospitalization and patient has stated previously to wife that he would not want prolonged life support, tracheostomy, hemodialysis.  He may very well have a functional recovery but has clearly stated in past he is not willing to go through process for this. I reassured the family that their role here is to do what patient would have wanted and respect his autonomy when he had capacity.  Both reassured and I am available to speak with them as needed during this difficult time.  Erskine Emery MD PCCM

## 2020-03-31 NOTE — Progress Notes (Signed)
Progress Note  Patient Name: Jorge Evans. Date of Encounter: 03/31/2020  [facility] Cardiologist: Elouise Munroe, MD   Subjective   Remains intubated and sedated  Inpatient Medications    Scheduled Meds:  chlorhexidine gluconate (MEDLINE KIT)  15 mL Mouth Rinse BID   Chlorhexidine Gluconate Cloth  6 each Topical Daily   digoxin  0.125 mg Intravenous Daily   enoxaparin (LOVENOX) injection  70 mg Subcutaneous Q12H   furosemide  80 mg Intravenous Q8H   insulin aspart  0-9 Units Subcutaneous Q6H   insulin glargine  5 Units Subcutaneous Daily   lidocaine  1 patch Transdermal Q24H   mouth rinse  15 mL Mouth Rinse 10 times per day   pantoprazole (PROTONIX) IV  40 mg Intravenous Q12H   polyethylene glycol  17 g Per Tube Daily   senna-docusate  2 tablet Per Tube BID   sodium chloride flush  10-40 mL Intracatheter Q12H   sodium chloride flush  5 mL Intracatheter Q8H   Continuous Infusions:  sodium chloride 10 mL/hr at 03/31/20 0800   dexmedetomidine (PRECEDEX) IV infusion 1 mcg/kg/hr (03/31/20 0800)   fentaNYL infusion INTRAVENOUS 150 mcg/hr (03/31/20 0800)   meropenem (MERREM) IV Stopped (03/31/20 0536)   norepinephrine (LEVOPHED) Adult infusion 5 mcg/min (03/31/20 0800)   phenylephrine (NEO-SYNEPHRINE) Adult infusion Stopped (03/28/20 3546)   propofol (DIPRIVAN) infusion 15 mcg/kg/min (03/31/20 0800)   TPN ADULT (ION) 75 mL/hr at 03/31/20 0600   vancomycin Stopped (03/31/20 0715)   PRN Meds: acetaminophen **OR** acetaminophen, fentaNYL, fentaNYL (SUBLIMAZE) injection, sodium chloride flush   Vital Signs    Vitals:   03/31/20 0715 03/31/20 0730 03/31/20 0745 03/31/20 0800  BP: 134/84 121/79 121/65 133/75  Pulse: (!) 109 (!) 104 93 (!) 107  Resp: (!) 30 (!) 34 (!) 32 (!) 31  Temp: (!) 101.12 F (38.4 C) (!) 101.12 F (38.4 C) (!) 100.94 F (38.3 C) (!) 100.94 F (38.3 C)  TempSrc:      SpO2: 100% 100% 100% 100%  Weight:       Height:        Intake/Output Summary (Last 24 hours) at 03/31/2020 0848 Last data filed at 03/31/2020 0800 Gross per 24 hour  Intake 4485.74 ml  Output 3160 ml  Net 1325.74 ml   Last 3 Weights 03/27/2020 03/09/2020 11/19/2019  Weight (lbs) 154 lb 154 lb 155 lb  Weight (kg) 69.854 kg 69.854 kg 70.308 kg      Telemetry    Atrial fibrillation with intermittent rapid rate- Personally Reviewed  Physical Exam   GEN: Intubated and sedated; anasarca noted Neck: JVP difficult to assess Cardiac: irregular, no murmur Respiratory: CTA anteriorly; no wheeze GI: Mildly distended; s/p surgery MS: 4+ edema Neuro:  not assessed; pt sedated.   Labs     Chemistry Recent Labs  Lab 03/27/20 0028 03/27/20 5681 03/29/20 0445 03/29/20 1123 03/30/20 0452 03/31/20 0500  NA 134*   < > 138 141 141 142  K 4.2   < > 4.0 4.2 4.1 4.0  CL 104   < > 104  --  104 104  CO2 14*   < > 26  --  28 28  GLUCOSE 142*   < > 178*  --  199* 161*  BUN 19   < > 35*  --  37* 41*  CREATININE 1.01   < > 1.13  --  1.02 1.02  CALCIUM 7.0*   < > 6.3*  --  6.9* 6.5*  PROT 4.1*  --  3.9*  --  4.1*  --   ALBUMIN 1.8*  --  1.1*  --  1.0*  --   AST 62*  --  182*  --  93*  --   ALT 64*  --  187*  --  128*  --   ALKPHOS 48  --  76  --  68  --   BILITOT 4.2*  --  1.8*  --  2.2*  --   GFRNONAA >60   < > >60  --  >60 >60  ANIONGAP 16*   < > 8  --  9 10   < > = values in this interval not displayed.     Hematology Recent Labs  Lab 03/29/20 0445 03/29/20 1123 03/30/20 0452 03/31/20 0500  WBC 12.5*  --  9.1 11.7*  RBC 2.93*  --  2.98* 3.11*  HGB 8.5* 8.5* 8.7* 8.7*  HCT 26.8* 25.0* 27.0* 28.9*  MCV 91.5  --  90.6 92.9  MCH 29.0  --  29.2 28.0  MCHC 31.7  --  32.2 30.1  RDW 14.5  --  14.6 15.0  PLT 120*  --  117* 132*     Radiology    DG Chest Port 1 View  Result Date: 03/30/2020 CLINICAL DATA:  ARDS. EXAM: PORTABLE CHEST 1 VIEW COMPARISON:  Multiple recent chest x-rays. The most recent is 03/29/2020  FINDINGS: The endotracheal tube, NG tube and right PICC line are stable. The cardiac silhouette, mediastinal and hilar contours are within normal limits and unchanged. Persistent bilateral pulmonary infiltrates. No pleural effusions. Stable right upper quadrant drainage catheter. IMPRESSION: 1. Stable support apparatus. 2. Persistent bilateral pulmonary infiltrates. Electronically Signed   By: Marijo Sanes M.D.   On: 03/30/2020 07:23    Patient Profile     69 y.o. male with history of gastric ulcers and anemia admitted with choledocholithiasis and now status post cholecystectomy complicated by peritonitis/sepsis being followed for atrial fibrillation.  Echocardiogram this admission showed hyperdynamic LV function.  Assessment & Plan    1 persistent atrial fibrillation-patient remains in atrial fibrillation and heart rate upper normal to mildly elevated; continue digoxin; not on norepi and BP will not allow calcium blocker or beta-blocker.  Can add IV amiodarone if needed.  Continue Lovenox.  Note his LV function is normal.  Can consider cardioversion in the future if atrial fibrillation persists once he recovers from present illness.  2 sepsis/peritonitis-continue antibiotics.  Management per critical care medicine and general surgery.  3 anasarca-diurese as tolerated.  Albumin of 1 felt to be contributing.  For questions or updates, please contact [place] Please consult [email or web address] for contact info under        Signed, Kirk Ruths, MD  03/31/2020, 8:48 AM

## 2020-04-01 DIAGNOSIS — R601 Generalized edema: Secondary | ICD-10-CM

## 2020-04-01 LAB — CULTURE, BLOOD (ROUTINE X 2)
Culture: NO GROWTH
Culture: NO GROWTH

## 2020-04-01 LAB — GLUCOSE, CAPILLARY
Glucose-Capillary: 125 mg/dL — ABNORMAL HIGH (ref 70–99)
Glucose-Capillary: 128 mg/dL — ABNORMAL HIGH (ref 70–99)
Glucose-Capillary: 129 mg/dL — ABNORMAL HIGH (ref 70–99)
Glucose-Capillary: 132 mg/dL — ABNORMAL HIGH (ref 70–99)
Glucose-Capillary: 136 mg/dL — ABNORMAL HIGH (ref 70–99)

## 2020-04-01 LAB — CBC
HCT: 25.8 % — ABNORMAL LOW (ref 39.0–52.0)
Hemoglobin: 8.2 g/dL — ABNORMAL LOW (ref 13.0–17.0)
MCH: 29 pg (ref 26.0–34.0)
MCHC: 31.8 g/dL (ref 30.0–36.0)
MCV: 91.2 fL (ref 80.0–100.0)
Platelets: 91 10*3/uL — ABNORMAL LOW (ref 150–400)
RBC: 2.83 MIL/uL — ABNORMAL LOW (ref 4.22–5.81)
RDW: 14.6 % (ref 11.5–15.5)
WBC: 10.1 10*3/uL (ref 4.0–10.5)
nRBC: 0.4 % — ABNORMAL HIGH (ref 0.0–0.2)

## 2020-04-01 LAB — BASIC METABOLIC PANEL
Anion gap: 9 (ref 5–15)
BUN: 45 mg/dL — ABNORMAL HIGH (ref 8–23)
CO2: 38 mmol/L — ABNORMAL HIGH (ref 22–32)
Calcium: 7.3 mg/dL — ABNORMAL LOW (ref 8.9–10.3)
Chloride: 100 mmol/L (ref 98–111)
Creatinine, Ser: 1.06 mg/dL (ref 0.61–1.24)
GFR, Estimated: >60 mL/min (ref >60)
Glucose, Bld: 145 mg/dL — ABNORMAL HIGH (ref 70–99)
Potassium: 3.8 mmol/L (ref 3.5–5.1)
Sodium: 147 mmol/L — ABNORMAL HIGH (ref 135–145)

## 2020-04-01 MED ORDER — CALCIUM GLUCONATE-NACL 1-0.675 GM/50ML-% IV SOLN
1.0000 g | Freq: Once | INTRAVENOUS | Status: AC
  Administered 2020-04-01: 10:00:00 1000 mg via INTRAVENOUS
  Filled 2020-04-01: qty 50

## 2020-04-01 MED ORDER — TRACE MINERALS CU-MN-SE-ZN 300-55-60-3000 MCG/ML IV SOLN
INTRAVENOUS | Status: AC
  Filled 2020-04-01: qty 708

## 2020-04-01 NOTE — Progress Notes (Signed)
6 Days Post-Op  Subjective: Sedated on vent.  Family considering comfort care.  ROS: See above, otherwise other systems negative  Objective: Vital signs in last 24 hours: Temp:  [97.16 F (36.2 C)-100.58 F (38.1 C)] 100.4 F (38 C) (12/29 0845) Pulse Rate:  [77-132] 97 (12/29 0845) Resp:  [27-40] 34 (12/29 0845) BP: (76-140)/(31-118) 119/61 (12/29 0845) SpO2:  [88 %-99 %] 98 % (12/29 0845) FiO2 (%):  [55 %-60 %] 60 % (12/29 0800) Last BM Date: 03/25/20  Intake/Output from previous day: 12/28 0701 - 12/29 0700 In: 4186 [I.V.:3585.9; IV Piggyback:600.1] Out: 5060 [Urine:4850; Drains:210] Intake/Output this shift: No intake/output data recorded.  PE: Abd: soft, but distended, incisions c/d/i. IR drain serous, surgical drain bilious, total op 210 last 24h.   Lab Results:  Recent Labs    03/31/20 0500 04/01/20 0500  WBC 11.7* 10.1  HGB 8.7* 8.2*  HCT 28.9* 25.8*  PLT 132* 91*   BMET Recent Labs    03/30/20 0452 03/31/20 0500  NA 141 142  K 4.1 4.0  CL 104 104  CO2 28 28  GLUCOSE 199* 161*  BUN 37* 41*  CREATININE 1.02 1.02  CALCIUM 6.9* 6.5*   PT/INR No results for input(s): LABPROT, INR in the last 72 hours. CMP     Component Value Date/Time   NA 142 03/31/2020 0500   K 4.0 03/31/2020 0500   CL 104 03/31/2020 0500   CO2 28 03/31/2020 0500   GLUCOSE 161 (H) 03/31/2020 0500   BUN 41 (H) 03/31/2020 0500   CREATININE 1.02 03/31/2020 0500   CREATININE 0.93 11/19/2019 0842   CALCIUM 6.5 (L) 03/31/2020 0500   PROT 4.1 (L) 03/30/2020 0452   ALBUMIN 1.0 (L) 03/30/2020 0452   AST 93 (H) 03/30/2020 0452   ALT 128 (H) 03/30/2020 0452   ALKPHOS 68 03/30/2020 0452   BILITOT 2.2 (H) 03/30/2020 0452   GFRNONAA >60 03/31/2020 0500   GFRAA >60 11/11/2016 1600   Lipase     Component Value Date/Time   LIPASE 20 03/25/2020 1452       Studies/Results: No results found.  Anti-infectives: Anti-infectives (From admission, onward)   Start      Dose/Rate Route Frequency Ordered Stop   03/28/20 2200  meropenem (MERREM) 1 g in sodium chloride 0.9 % 100 mL IVPB        1 g 200 mL/hr over 30 Minutes Intravenous Every 8 hours 03/28/20 1809     03/28/20 1900  vancomycin (VANCOREADY) IVPB 750 mg/150 mL        750 mg 150 mL/hr over 60 Minutes Intravenous Every 12 hours 03/28/20 1809 04/02/20 2359   03/28/20 0900  vancomycin (VANCOREADY) IVPB 1500 mg/300 mL  Status:  Discontinued        1,500 mg 150 mL/hr over 120 Minutes Intravenous Every 24 hours 03/27/20 0750 03/27/20 1028   03/27/20 1800  vancomycin (VANCOREADY) IVPB 1500 mg/300 mL  Status:  Discontinued        1,500 mg 150 mL/hr over 120 Minutes Intravenous Every 24 hours 03/10/2020 1732 03/27/20 0750   03/27/20 0830  vancomycin (VANCOREADY) IVPB 1750 mg/350 mL        1,750 mg 175 mL/hr over 120 Minutes Intravenous STAT 03/27/20 0750 03/27/20 1026   03/29/2020 1830  vancomycin (VANCOREADY) IVPB 1750 mg/350 mL  Status:  Discontinued        1,750 mg 175 mL/hr over 120 Minutes Intravenous  Once 03/27/2020 1732 03/27/20 0750   03/21/20 0200  piperacillin-tazobactam (ZOSYN) IVPB 3.375 g  Status:  Discontinued        3.375 g 12.5 mL/hr over 240 Minutes Intravenous Every 8 hours 03/21/20 0007 03/28/20 1711   03/09/2020 0600  ceFAZolin (ANCEF) IVPB 2g/100 mL premix        2 g 200 mL/hr over 30 Minutes Intravenous On call to O.R. 03/18/20 1334 03/20/20 0559   03/07/2020 0600  piperacillin-tazobactam (ZOSYN) IVPB 3.375 g  Status:  Discontinued        3.375 g 12.5 mL/hr over 240 Minutes Intravenous Every 8 hours 03/20/2020 0046 03/15/20 1109   03-28-2020 2330  piperacillin-tazobactam (ZOSYN) IVPB 3.375 g        3.375 g 100 mL/hr over 30 Minutes Intravenous  Once 03/28/2020 2317 03/22/2020 0131       Assessment/Plan GERD IDA PMH Bilroth II for bleeding gastric ulcers ABL anemia -relatively stable. A. Fib with RVR - on heparin and diltiazem gtts  Symptomatic cholelithiasis Choledocholithiasis  without evidence of acute cholecystitis  S/p ERCP, placement of CBD stent 12/10 [name] -unable to perform a sphincterotomy and stone extraction due to anatomy s/p Billroth II S/plaparoscopic cholecystectomy12/16 Dr. Janee Morn Postoperative bile leak - POD#13/6 - CT scan 12/17 revealed moderate volumeperihepatic free fluid containing foci of air with non organized fluid and air in the gallbladder fossaconcerning for biloma - s/p IR drain placement 12/18 with yield high volume bilious fluid, culture pending -s/p ERCP with sphincterotomy/sphincteroplasty, stent placement and lithotripsy 12/21 by Dr. Meridee Score - Tbili 2.2, essentially stable. - drain output remainsbilious, moderate to low volume bile leak - OR for laparoscopic exploration due to increased free air on Ct 12/23, washout and drain placement 12/23 by Dr. Corliss Skains, had new drain placed in perihepatic region which was undrained by IR drain.  -patient now DNR and likely transition to comfort care.  VDRF: appears to be in moderate ARDs. Per CCM.  ID: zosyn 12/18>>at least 12/27, aspirated on induction in OR 12/23, monitor clinically, already on zosyn FEN:IVF,TPN, anticipatedileus, but can trial trickle TF when passing flatus/BM(not yet) VTE: SCDs,heparin gtt   Drain output is relatively low volume. No further intervention indicated for this at this point regardless of GOC decision.    LOS: 19 days    Berna Bue , MD [facility] 04/01/2020, 8:52 AM Please see Amion for pager number during day hours 7:00am-4:30pm or 7:00am -11:30am on weekends

## 2020-04-01 NOTE — Progress Notes (Signed)
NAME:  Jorge Gillingham., MRN:  [id number], DOB:  1950-07-22, LOS: 13 ADMISSION DATE:  04/01/2020, CONSULTATION DATE:  03/29/2020 REFERRING MD:  Dr. Garald Braver, CHIEF COMPLAINT:  Respiratory failure    Brief History:  69 year old male presented originally 12/9 with complaints of abdominal pain, nausea, and vomiting, imaging concerning for choledocholithiasis underwent ERCP with stent placement later followed with cholecystectomy.  Overnight 12/23 patient developed worsening abdominal pain patient was taken for diagnostic laparoscopy with placement of intra-abdominal drain.  She remained intubated postoperatively due to respiratory compromise including questionable aspiration   Past Medical History   Past Medical History:  Diagnosis Date  . Anemia   . GERD (gastroesophageal reflux disease)     Significant Hospital Events   -ercp with cbd stent 12/10  -Cholecystectomy 12/16  -IR drain for bile leak 12/18  -ercp and stent placement 12/21 -diagnostic lap 12/23 and intra-abdominal drain, A. fib with RVR requiring diltiazem, septic shock requiring phenylephrine 12/24: More hypoxic requiring, chest x-ray showing bilateral airspace disease worrisome for evolving ARDS, placed on ARDS protocol, added fentanyl infusion.  Metabolic acidosis,, with ongoing septic shock.  Off calcium channel blocker.  Titrating phenylephrine.  Additional volume resuscitation.  Had bronchoscopy.,  Had significant clear bilious aspirate which was lavaged.  Placed on bicarbonate drip for ongoing acidosis 12/25: Acid-base improved.  CVP now 23, off pressors.  One-time Lasix.  Trying to minimize fluid administration.  Still on high PEEP/FiO2 spiking fever in excess of 103, change antibiotic coverage to meropenem and vancomycin.12/26: Hemodynamically stable overnight, no fever spike, white blood cell count significantly improved.  Consults:  GI Surgery IR [facility] cardiology  Procedures:  Endotracheal tube  12/23  Significant Diagnostic Tests:  12/9 ct abd/pelvis: 1. Interval development of moderate intra and extrahepatic biliary dilatation with multiple rounded filling defects within the mid to distal common duct, suspicious for choledocholithiasis. Suggest correlation with LFTs, further evaluation with MRCP may be obtained. 2. Streaky hyperdense foci within the bladder, suspect that this is due to excreted contrast as opposed to bladder mass. 3. Status post partial gastrectomy with no evidence for an obstruction. 4. Aortic atherosclerosis.  12/17 ct abd/pelvis: 1. Interval cholecystectomy. Moderate volume of perihepatic free fluid containing foci of air with non organized fluid and air in the gallbladder fossa. Given the amount of fluid, biloma is favored over postoperative seroma. Sterility is indeterminate by imaging. Consider nuclear medicine hepatic biliary scan to assess for bile leak. 2. Plastic common bile duct stent in place. Suspected residual 9 mm stone at the proximal aspect of the stent. Common bile duct dilatation of 16 mm, with improved but persistent intrahepatic biliary dilatation on the left. Pneumobilia related to recent procedure. 3. Small right and trace left pleural effusions. 4. Absent renal excretion on delayed phase imaging, can be seen with renal dysfunction. 5. Mild distal colonic diverticulosis without diverticulitis. 12/20 echo: LVEF 65-70% RV normal 12/22 ct abdomen pelvis: 1. Moderate amount of intra-abdominal free air, increased in severity when compared to the prior study. While this is likely secondary to the patient's recent surgical intervention, the presence of a perforated hollow viscus cannot completely be excluded. 2. Additional findings consistent with a postoperative ileus. 3. Right-sided surgical drain positioning, as described above, with near-total resolution of the perihepatic fluid seen on the prior study. 4. Interval distal  pancreatic duct stent placement since the prior exam. 5. Moderate severity anasarca. 6. Small hiatal hernia. 7. Aortic atherosclerosis.  Micro Data:  12/10 sars2: neg 12/18 perihepatic  biloma: Multiple organisms present, none predominant, no anaerobes isolated.  Antimicrobials:  Zosyn 12/17-> 12/25 Vancomycin 12/ 23>> Meropenem 12/25>>> Interim history/subjective:  Tmax 100.4 overnight. Remains heavily sedated and on high vent requirements.   Objective   Blood pressure 105/68, pulse 93, temperature 100.04 F (37.8 C), resp. rate (!) 32, height 5' 5.75" (1.67 m), weight 69.9 kg, SpO2 98 %.    Vent Mode: PRVC FiO2 (%):  [55 %-60 %] 60 % Set Rate:  [32 bmp] 32 bmp Vt Set:  [380 mL] 380 mL PEEP:  [12 cmH20-14 cmH20] 12 cmH20 Plateau Pressure:  [20 cmH20] 20 cmH20   Intake/Output Summary (Last 24 hours) at 04/01/2020 0715 Last data filed at 04/01/2020 0600 Gross per 24 hour  Intake 4186.02 ml  Output 5060 ml  Net -873.98 ml   Net for admission +22L  Filed Weights   03/18/2020 1442 03/11/2020 1441  Weight: 69.9 kg 69.9 kg    Examination:  General: critically ill appearing man laying in bed intubated and sedated HEENT: /AT, eyes anicteric, ETT in place. Resp: mild vent dyssynchrony, intermittent cuff leak. Peak pressures in mid-20s. Minimal thick ETT secretions. Scattered rhonchi cleared with suctioning. Cardiac: regular rate and rhythm Abdomen: soft, biliary drain in place Neuro: RASS -5, pinpoint pupils Extremities: severe anasarca, now with blisters unroofing and oozing. GU: worsening penile and scrotal edema, hemorrhagic appearing blisters forming Derm: pallor, unroofed blisters with thin drainage  Resolved Hospital Problem list   Septic shock resolved Worsening lactic acidosis with anion gap metabolic acidosis.  Assessment & Plan:  Acute choledocholithiasis, status post cholecystectomy complicated by bile leak and abdominal peritonitis 12/18 requiring stenting  on 12/21, and subsequent return to the OR 12/23 with washout, lysis of adhesion, and new drain placement Plan -Continue drain management per general surgery and IR  -N.p.o. with NGT to LIWS -con't TPN  Severe sepsis secondary to abdominal peritonitis; back in shock on 12/27 likely due to sedation requirements. New/ recurrent fever overnight with increasing WBC. -surgical abscess culture: Multiple organisms present/polymicrobial Plan -Continue wound care. Drain remains in place. Output slowing. -Con't empiric meropenem & vancomycin -Norepinephrine to maintain MAP >65. -Based on discussion with his wife, additional cultures and further escalation of antibiotics would not be consistent with his goals.  Severe anasarca following aggressive volume resuscitation Plan -Lasix drip; goal net negetive fluid balance. Increasing to 10mg /h  Acute hypoxic respiratory failure 2/2 aspiration PNA and ARDS Plan -Con't LTVV, 4-8cc/kg IBW with goal Pplat <30 and DP <15 -still needing heavy sedation for lung protective ventilation -VAP prevention protocol -con't lasix infusion; needs aggressive diuresis -Daily SAT & SBT when appropriate; currently vent requirements remain too high for weaning. Needs heavy sedation to tolerate MV- goal RASS -4 to -5.  -Titrate PEEP & FiO2 per ARDS protocol, permissive hypercapnia. -VAP prevention protocol  Afib with RVR; new- onset this admission, likely due to critical illness Plan -con't to optimize sedation -con't lovenox -con't digoxin -appreciate cardiology's input  Anemia due to critical illness Thrombocytopenia, likely due to critical illness -con't to monitor -transfuse for Hb <7 or hemodynamically significant bleeding  Hyperglycemia, controlled -con't lantus 5 units daily -con't SSI PRN -goal BG 140-180  Severe protein energy malnutrition, undetectable pre-albumin Anasarca -con't TPN -aggressive diuresis  Hypocalcemia -1g Ca+  gluconate  GoC -DNR. Liberalized visitation, planning for eventual withdrawal of aggressive care measures to focus on comfort.  Best practice:  Diet: npo TNA per surg Pain/Anxiety/Delirium protocol (if indicated): per protocol VAP protocol (if indicated): per  protocol DVT prophylaxis:  Iovenox GI prophylaxis: ppi Glucose control: basal bolus insulin Mobility: bedrest Code Status: FULL Family Communication: wife disposition: ICU Goals of care addressed 12/28 with wife, RN   This patient is critically ill with multiple organ system failure which requires frequent high complexity decision making, assessment, support, evaluation, and titration of therapies. This was completed through the application of advanced monitoring technologies and extensive interpretation of multiple databases. During this encounter critical care time was devoted to patient care services described in this note for 41 minutes.  Steffanie Dunn, DO 04/01/20 7:55 AM [facility]

## 2020-04-01 NOTE — Plan of Care (Signed)
Wife updated at bedside.  Steffanie Dunn, DO 04/01/20 1:35 PM [facility]

## 2020-04-01 NOTE — Progress Notes (Signed)
PHARMACY - TOTAL PARENTERAL NUTRITION CONSULT NOTE   Indication: severe malnutrition, anticipated ileus  Patient Measurements: Height: 5' 5.75" (167 cm) Weight: 69.9 kg (154 lb) IBW/kg (Calculated) : 63.22 TPN AdjBW (KG): 69.9 Body mass index is 25.05 kg/m. Usual Weight: 66 kg  Assessment:  69 yom who presented to the ED on 12/09 with abdominal pain and N/V. PMH for Bilroth II for bleeding gastric ulcers. He was found to have symptomatic cholelithiasis and is s/p ERCP with placement of CBD stent on 12/10 - unable to perform a sphincterotomy and stone extraction due to anatomy s/p Billroth II. He is s/p lap-chole on 12/16 which was complicated by a post-op bile leak. IR placed placed a drain on 12/18. Repeat ERCP with sphincterotomy/sphincteroplasty, stent placement and lithotripsy done on 12/21. Unable to advance diet due to nausea and hiccups and has been NPO or on clear liquids since 12/16. Pharmacy consulted for nutritional support for severe acute malnutrition and anticipated ileus.   Glucose / Insulin: no hx DM; CBGs <180 last 24hr on TPN, used 5 units insulin/24 hr, Lantus 5 daily Electrolytes: all WNL 12/28, labs late 12/29 Renal: SCr 1.02, BUN 37 LFTs / TGs: AST/ALT down 93/128, TG WNL, Tbili up 2.2 Prealbumin / albumin: albumin 1, prealbumin down <5 Intake / Output; MIVF: diuresing with Lasix drip, UOP 1.1>2.9 ml/kg/hr, net +22L.R abd drains O/P 210 ml GI Imaging: Surgeries / Procedures:  12/10 s/p ERCP with stent placement 12/16 s/p laparoscopic cholecystectomy 12/18 s/p CT guided drainage of perihepatic fluid collection and drain placement 12/21 s/p repeat ERCP 12/23 s/p diagnostic laparoscopy with placement of intra-abdominal drain  Central access: Triple lumen PICC placed 12/23 TPN start date: April 22, 2020  Nutritional Goals (per RD recommendation on 12/27): kCal: 1863, Protein: 105-120, Fluid: >1.8L Goal TPN rate is 75 mL/hr (provides 106 g of protein and 1893 kcals per  day) - minimizing fluid, TPN adjusted for lower volume on 12/26 and updated based on RD rec on 12/28  Current Nutrition:  NPO and TPN  On propofol - attempting to wean off today  Plan:  -Continue concentrated TPN at goal rate 75 mL/hr at 1800  - provides 106 g of protein, 31 g lipid, 342 g dextrose, and 1893 kcal providing 100% of needs -Electrolytes in TPN - 55mEq/L of Na, 6mEq/L of Ca, 41mEq/L of Mg; 40mEq/L of K; 20 mmol/L of Phos. Continue max acetate. -Watch lytes closely with diuresis - replace outside of TPN -Add standard MVI and trace elements to TPN -Continue Sensitive q6h SSI and adjust as needed  -TPN labs in AM -GOC tomorrow  Thank you for involving pharmacy in this patient's care.  Daylene Posey, PharmD Clinical Pharmacist Please check AMION for all [facility] Pharm[facility] numbers 04/01/2020 8:45 AM

## 2020-04-01 NOTE — Progress Notes (Signed)
   Progress Note  Patient Name: Jorge Evans. Date of Encounter: 04/01/2020  [facility] Cardiologist: Parke Poisson, MD   Telemetry personally reviewed; pt has converted to sinus this AM; continue digoxin and enoxaparin. Being diuresed for anasarca likely related to low albumin. Echo showed normal LV function.   For questions or updates, please contact [facility] Please consult [email or web address] for contact info under        Signed, Olga Millers, MD  04/01/2020, 8:40 AM

## 2020-04-01 NOTE — Progress Notes (Signed)
OT Cancellation Note  Patient Details Name: Jorge Evans. MRN: [id number] DOB: 08/03/1950   Cancelled Treatment:    Reason Eval/Treat Not Completed: Other (comment); pt with medical decline, spoke with RN and confirms likely moving towards comfort care at this time. Acute OT to sign off at this time. Please re-consult should pt's needs change.  Marcy Siren, OT Acute Rehabilitation Services Pager [phone] Office [phone]   Orlando Penner 04/01/2020, 10:45 AM

## 2020-04-01 NOTE — Progress Notes (Signed)
PT Cancellation Note  Patient Details Name: Jorge Evans. MRN: [id number] DOB: 07/25/1950   Cancelled Treatment:    Reason Eval/Treat Not Completed: PT screened, no needs identified, will sign off - Family having discussions about pt POC, suspect transitioning to comfort. PT to sign off, please re-consult as needed.   Marye Round, PT Acute Rehabilitation Services Pager [phone]  Office [phone]    Truddie Coco 04/01/2020, 10:49 AM

## 2020-04-02 ENCOUNTER — Encounter: Payer: Self-pay | Admitting: Gastroenterology

## 2020-04-02 LAB — COMPREHENSIVE METABOLIC PANEL
ALT: 34 U/L (ref 0–44)
AST: 50 U/L — ABNORMAL HIGH (ref 15–41)
Albumin: <1 g/dL — ABNORMAL LOW (ref 3.5–5.0)
Alkaline Phosphatase: 63 U/L (ref 38–126)
Anion gap: 13 (ref 5–15)
BUN: 51 mg/dL — ABNORMAL HIGH (ref 8–23)
CO2: 37 mmol/L — ABNORMAL HIGH (ref 22–32)
Calcium: 7.3 mg/dL — ABNORMAL LOW (ref 8.9–10.3)
Chloride: 98 mmol/L (ref 98–111)
Creatinine, Ser: 1.09 mg/dL (ref 0.61–1.24)
GFR, Estimated: >60 mL/min (ref >60)
Glucose, Bld: 175 mg/dL — ABNORMAL HIGH (ref 70–99)
Potassium: 3.5 mmol/L (ref 3.5–5.1)
Sodium: 148 mmol/L — ABNORMAL HIGH (ref 135–145)
Total Bilirubin: 2.3 mg/dL — ABNORMAL HIGH (ref 0.3–1.2)
Total Protein: 4 g/dL — ABNORMAL LOW (ref 6.5–8.1)

## 2020-04-02 LAB — CBC
HCT: 24.8 % — ABNORMAL LOW (ref 39.0–52.0)
Hemoglobin: 7.7 g/dL — ABNORMAL LOW (ref 13.0–17.0)
MCH: 28.3 pg (ref 26.0–34.0)
MCHC: 31 g/dL (ref 30.0–36.0)
MCV: 91.2 fL (ref 80.0–100.0)
Platelets: 73 10*3/uL — ABNORMAL LOW (ref 150–400)
RBC: 2.72 MIL/uL — ABNORMAL LOW (ref 4.22–5.81)
RDW: 15 % (ref 11.5–15.5)
WBC: 8.3 10*3/uL (ref 4.0–10.5)
nRBC: 0.5 % — ABNORMAL HIGH (ref 0.0–0.2)

## 2020-04-02 LAB — TRIGLYCERIDES: Triglycerides: 168 mg/dL — ABNORMAL HIGH (ref <150)

## 2020-04-02 LAB — GLUCOSE, CAPILLARY
Glucose-Capillary: 175 mg/dL — ABNORMAL HIGH (ref 70–99)
Glucose-Capillary: 153 mg/dL — ABNORMAL HIGH (ref 70–99)
Glucose-Capillary: 136 mg/dL — ABNORMAL HIGH (ref 70–99)
Glucose-Capillary: 145 mg/dL — ABNORMAL HIGH (ref 70–99)

## 2020-04-02 LAB — PHOSPHORUS: Phosphorus: 4.1 mg/dL (ref 2.5–4.6)

## 2020-04-02 LAB — MAGNESIUM: Magnesium: 2.1 mg/dL (ref 1.7–2.4)

## 2020-04-02 MED ORDER — TRACE MINERALS CU-MN-SE-ZN 300-55-60-3000 MCG/ML IV SOLN
INTRAVENOUS | Status: AC
  Filled 2020-04-02: qty 708

## 2020-04-02 MED ORDER — CALCIUM GLUCONATE-NACL 1-0.675 GM/50ML-% IV SOLN
1.0000 g | Freq: Once | INTRAVENOUS | Status: AC
  Administered 2020-04-02: 11:00:00 1000 mg via INTRAVENOUS
  Filled 2020-04-02: qty 50

## 2020-04-02 MED ORDER — ALBUMIN HUMAN 25 % IV SOLN
25.0000 g | Freq: Once | INTRAVENOUS | Status: AC
  Administered 2020-04-02: 09:00:00 25 g via INTRAVENOUS
  Filled 2020-04-02: qty 100

## 2020-04-02 NOTE — Plan of Care (Signed)
  Problem: Nutrition: Goal: Adequate nutrition will be maintained Outcome: Progressing   Problem: Coping: Goal: Level of anxiety will decrease Outcome: Progressing   Problem: Pain Managment: Goal: General experience of comfort will improve Outcome: Progressing   Problem: Activity: Goal: Risk for activity intolerance will decrease Outcome: Not Progressing

## 2020-04-02 NOTE — Progress Notes (Signed)
NAME:  Jorge Gillingham., MRN:  [id number], DOB:  1950-07-22, LOS: 13 ADMISSION DATE:  04/01/2020, CONSULTATION DATE:  03/29/2020 REFERRING MD:  Dr. Garald Braver, CHIEF COMPLAINT:  Respiratory failure    Brief History:  69 year old male presented originally 12/9 with complaints of abdominal pain, nausea, and vomiting, imaging concerning for choledocholithiasis underwent ERCP with stent placement later followed with cholecystectomy.  Overnight 12/23 patient developed worsening abdominal pain patient was taken for diagnostic laparoscopy with placement of intra-abdominal drain.  She remained intubated postoperatively due to respiratory compromise including questionable aspiration   Past Medical History   Past Medical History:  Diagnosis Date  . Anemia   . GERD (gastroesophageal reflux disease)     Significant Hospital Events   -ercp with cbd stent 12/10  -Cholecystectomy 12/16  -IR drain for bile leak 12/18  -ercp and stent placement 12/21 -diagnostic lap 12/23 and intra-abdominal drain, A. fib with RVR requiring diltiazem, septic shock requiring phenylephrine 12/24: More hypoxic requiring, chest x-ray showing bilateral airspace disease worrisome for evolving ARDS, placed on ARDS protocol, added fentanyl infusion.  Metabolic acidosis,, with ongoing septic shock.  Off calcium channel blocker.  Titrating phenylephrine.  Additional volume resuscitation.  Had bronchoscopy.,  Had significant clear bilious aspirate which was lavaged.  Placed on bicarbonate drip for ongoing acidosis 12/25: Acid-base improved.  CVP now 23, off pressors.  One-time Lasix.  Trying to minimize fluid administration.  Still on high PEEP/FiO2 spiking fever in excess of 103, change antibiotic coverage to meropenem and vancomycin.12/26: Hemodynamically stable overnight, no fever spike, white blood cell count significantly improved.  Consults:  GI Surgery IR [facility] cardiology  Procedures:  Endotracheal tube  12/23  Significant Diagnostic Tests:  12/9 ct abd/pelvis: 1. Interval development of moderate intra and extrahepatic biliary dilatation with multiple rounded filling defects within the mid to distal common duct, suspicious for choledocholithiasis. Suggest correlation with LFTs, further evaluation with MRCP may be obtained. 2. Streaky hyperdense foci within the bladder, suspect that this is due to excreted contrast as opposed to bladder mass. 3. Status post partial gastrectomy with no evidence for an obstruction. 4. Aortic atherosclerosis.  12/17 ct abd/pelvis: 1. Interval cholecystectomy. Moderate volume of perihepatic free fluid containing foci of air with non organized fluid and air in the gallbladder fossa. Given the amount of fluid, biloma is favored over postoperative seroma. Sterility is indeterminate by imaging. Consider nuclear medicine hepatic biliary scan to assess for bile leak. 2. Plastic common bile duct stent in place. Suspected residual 9 mm stone at the proximal aspect of the stent. Common bile duct dilatation of 16 mm, with improved but persistent intrahepatic biliary dilatation on the left. Pneumobilia related to recent procedure. 3. Small right and trace left pleural effusions. 4. Absent renal excretion on delayed phase imaging, can be seen with renal dysfunction. 5. Mild distal colonic diverticulosis without diverticulitis. 12/20 echo: LVEF 65-70% RV normal 12/22 ct abdomen pelvis: 1. Moderate amount of intra-abdominal free air, increased in severity when compared to the prior study. While this is likely secondary to the patient's recent surgical intervention, the presence of a perforated hollow viscus cannot completely be excluded. 2. Additional findings consistent with a postoperative ileus. 3. Right-sided surgical drain positioning, as described above, with near-total resolution of the perihepatic fluid seen on the prior study. 4. Interval distal  pancreatic duct stent placement since the prior exam. 5. Moderate severity anasarca. 6. Small hiatal hernia. 7. Aortic atherosclerosis.  Micro Data:  12/10 sars2: neg 12/18 perihepatic  biloma: Multiple organisms present, none predominant, no anaerobes isolated.  Antimicrobials:  Zosyn 12/17-> 12/25 Vancomycin 12/ 23>> Meropenem 12/25>>> Interim history/subjective:  Afebrile overnight. Remains intubated, sedated.  Objective   Blood pressure (!) 150/70, pulse 100, temperature 97.88 F (36.6 C), resp. rate (!) 35, height 5' 5.75" (1.67 m), weight 69.9 kg, SpO2 93 %.    Vent Mode: PRVC FiO2 (%):  [50 %-60 %] 50 % Set Rate:  [32 bmp] 32 bmp Vt Set:  [380 mL] 380 mL PEEP:  [12 cmH20] 12 cmH20 Plateau Pressure:  [24 cmH20-27 cmH20] 25 cmH20   Intake/Output Summary (Last 24 hours) at 04/02/2020 0804 Last data filed at 04/02/2020 0600 Gross per 24 hour  Intake 3516.14 ml  Output 3920 ml  Net -403.86 ml   Net for admission +22L  Filed Weights   03/06/2020 1442 03/21/2020 1441  Weight: 69.9 kg 69.9 kg    Examination:  General: Critically ill appearing man laying in bed in NAD, intubated and sedated HEENT: [place]/AT, eyes icteric with scleral edema. ETT in place. Resp: synchronous with MV. Ppeak 26, Pplat 24, DP 12. CTAB. Much less respiratory secretions. Cardiac: regular rate, irreg rhythm Abdomen: soft, NT, mildly distended Neuro: RASS -5, pupils sluggishly reactive. Extremities: severe anasarca unchanged, unroofed blisters still oozing. GU: persistent penile and scrotal edema Derm: pallor, unchanged open blisters  Resolved Hospital Problem list   Septic shock resolved Worsening lactic acidosis with anion gap metabolic acidosis.  Assessment & Plan:  Acute choledocholithiasis, status post cholecystectomy complicated by bile leak and abdominal peritonitis 12/18 requiring stenting on 12/21, and subsequent return to the OR 12/23 with washout, lysis of adhesion, and new drain  placement Plan -Continue drain management per general surgery and IR  -N.p.o. with NGT to LIWS -con't TPN  Severe sepsis secondary to abdominal peritonitis; back in shock on 12/27 likely due to sedation requirements. New/ recurrent fever overnight with increasing WBC>> downtrending and fever resolved. -surgical abscess culture: Multiple organisms present/polymicrobial Plan -Continue wound care. Drain remains in place. Output slowing. -Con't empiric meropenem & vancomycin -Norepinephrine to maintain MAP >65. -no escalation of care  Severe anasarca following aggressive volume resuscitation Plan -Lasix drip; goal net negetive fluid balance. Continue at 10mg /h. Trial of albumin 25mg  x 1 dose.  Acute hypoxic respiratory failure 2/2 aspiration PNA and ARDS Plan -Con't LTVV, 4-8cc/kg IBW with goal Pplat <30 and DP <15 -still needing heavy sedation for lung protective ventilation -VAP prevention protocol -con't lasix infusion; needs aggressive diuresis, which has been challenging with his lack of response to lasix. Likely he has renal dysfunction that BUN & Creatinine are not able to capture. -Daily SAT & SBT when appropriate; currently vent requirements remain too high for weaning. Needs heavy sedation to tolerate MV- goal RASS -4 to -5.  -Titrate PEEP & FiO2 per ARDS protocol, permissive hypercapnia. -VAP prevention protocol  Afib with RVR; new- onset this admission, likely due to critical illness Plan -con't to optimize sedation -holding lovenox for now with thrombocytopenia -con't digoxin  Anemia due to critical illness Thrombocytopenia, likely due to critical illness- worsening -con't to monitor -transfuse for Hb <7 or hemodynamically significant bleeding -d/c lovenox given persistent platelets <100K; low suspicion for HIT given up and down course of platelets and alternative cause (antibiotics, sepsis)  Hyperglycemia, controlled -con't lantus 5 units daily -con't SSI  PRN -goal BG 140-180  Severe protein energy malnutrition, undetectable pre-albumin Anasarca -con't TPN -aggressive diuresis + albumin trial  Hypocalcemia -1g Ca+ gluconate today  GoC -  DNR. Liberalized visitation, planning for eventual withdrawal of aggressive care measures to focus on comfort. Planning once daughter arrives on Friday (per the wife yesterday). Will update wife today when she is at bedside.  Best practice:  Diet: npo TNA per surg Pain/Anxiety/Delirium protocol (if indicated): per protocol VAP protocol (if indicated): per protocol DVT prophylaxis:  Iovenox GI prophylaxis: ppi Glucose control: basal bolus insulin Mobility: bedrest Code Status: FULL Family Communication: wife disposition: ICU Goals of care addressed 12/28 with wife, RN   This patient is critically ill with multiple organ system failure which requires frequent high complexity decision making, assessment, support, evaluation, and titration of therapies. This was completed through the application of advanced monitoring technologies and extensive interpretation of multiple databases. During this encounter critical care time was devoted to patient care services described in this note for 38 minutes.  Steffanie Dunn, DO 04/02/20 10:02 AM [facility]

## 2020-04-02 NOTE — Progress Notes (Signed)
PHARMACY - TOTAL PARENTERAL NUTRITION CONSULT NOTE   Indication: severe malnutrition, anticipated ileus  Patient Measurements: Height: 5' 5.75" (167 cm) Weight: 69.9 kg (154 lb) IBW/kg (Calculated) : 63.22 TPN AdjBW (KG): 69.9 Body mass index is 25.05 kg/m. Usual Weight: 66 kg  Assessment:  69 yom who presented to the ED on 12/09 with abdominal pain and N/V. PMH for Bilroth II for bleeding gastric ulcers. He was found to have symptomatic cholelithiasis and is s/p ERCP with placement of CBD stent on 12/10 - unable to perform a sphincterotomy and stone extraction due to anatomy s/p Billroth II. He is s/p lap-chole on 12/16 which was complicated by a post-op bile leak. IR placed placed a drain on 12/18. Repeat ERCP with sphincterotomy/sphincteroplasty, stent placement and lithotripsy done on 12/21. Unable to advance diet due to nausea and hiccups and has been NPO or on clear liquids since 12/16. Pharmacy consulted for nutritional support for severe acute malnutrition and anticipated ileus.   Glucose / Insulin: no hx DM; CBGs <180 last 24hr on TPN, used 5 units insulin/24 hr, Lantus 5 daily Electrolytes: K 4>3.5, Na up to 148, Bicarb up to 37, CorCa wnl (has received IV Ca) Renal: SCr 1.09, BUN 37 LFTs / TGs: AST/ALT down 50/34, TG WNL, Tbili up 2.2>2.3 Prealbumin / albumin: albumin <1, prealbumin down <5 Intake / Output; MIVF: diuresing with Lasix drip, UOP 2.9>2.2 ml/kg/hr, net +22L.R abd drains O/P 300 ml GI Imaging: Surgeries / Procedures:  12/10 s/p ERCP with stent placement 12/16 s/p laparoscopic cholecystectomy 12/18 s/p CT guided drainage of perihepatic fluid collection and drain placement 12/21 s/p repeat ERCP 12/23 s/p diagnostic laparoscopy with placement of intra-abdominal drain  Central access: Triple lumen PICC placed 12/23 TPN start date: 03/31/2020  Nutritional Goals (per RD recommendation on 12/27): kCal: 1863, Protein: 105-120, Fluid: >1.8L Goal TPN rate is 75 mL/hr  (provides 106 g of protein and 1893 kcals per day) - minimizing fluid, TPN adjusted for lower volume on 12/26 and updated based on RD rec on 12/28  Current Nutrition:  NPO and TPN   Plan:  Continue concentrated TPN at goal rate 75 mL/hr at 1800  - provides 106 g of protein, 31 g lipid, 342 g dextrose, and 1893 kcal providing 100% of needs Electrolytes in TPN - Dec Na, others as previous. Adjust KW:IOXBDZH to 1:1 Watch lytes closely with diuresis (not much vol off now, concern for kidney fxn and will hold K same for now) Add standard MVI and trace elements to TPN Continue Sensitive q6h SSI and adjust as needed  BMP F/u GOC discussions  Watch liver fx  Thank you for involving pharmacy in this patient's care.  Daylene Posey, PharmD Clinical Pharmacist Please check AMION for all [facility] Pharmacy numbers 04/02/2020 9:50 AM

## 2020-04-02 NOTE — Progress Notes (Signed)
7 Days Post-Op  Subjective: Sedated on vent.  Ongoing goals of care discussions with family.   Objective: Vital signs in last 24 hours: Temp:  [97.52 F (36.4 C)-100.22 F (37.9 C)] 97.88 F (36.6 C) (12/30 0900) Pulse Rate:  [59-112] 101 (12/30 0900) Resp:  [25-43] 30 (12/30 0900) BP: (92-167)/(40-80) 129/63 (12/30 0900) SpO2:  [89 %-100 %] 89 % (12/30 0900) FiO2 (%):  [50 %-60 %] 50 % (12/30 0745) Last BM Date: 03/25/20  Intake/Output from previous day: 12/29 0701 - 12/30 0700 In: 4178.8 [I.V.:3466.5; IV Piggyback:712.3] Out: 4100 [Urine:3750; Emesis/NG output:50; Drains:300] Intake/Output this shift: Total I/O In: 332.5 [I.V.:227.9; IV Piggyback:104.6] Out: 450 [Urine:415; Drains:35]  PE: Abd: soft, but distended, incisions c/d/i. IR drain serous, surgical drain bilious, total op 280 last 24h from surgical drain.   Lab Results:  Recent Labs    04/01/20 0500 04/02/20 0546  WBC 10.1 8.3  HGB 8.2* 7.7*  HCT 25.8* 24.8*  PLT 91* 73*   BMET Recent Labs    04/01/20 1252 04/02/20 0546  NA 147* 148*  K 3.8 3.5  CL 100 98  CO2 38* 37*  GLUCOSE 145* 175*  BUN 45* 51*  CREATININE 1.06 1.09  CALCIUM 7.3* 7.3*   PT/INR No results for input(s): LABPROT, INR in the last 72 hours. CMP     Component Value Date/Time   NA 148 (H) 04/02/2020 0546   K 3.5 04/02/2020 0546   CL 98 04/02/2020 0546   CO2 37 (H) 04/02/2020 0546   GLUCOSE 175 (H) 04/02/2020 0546   BUN 51 (H) 04/02/2020 0546   CREATININE 1.09 04/02/2020 0546   CREATININE 0.93 11/19/2019 0842   CALCIUM 7.3 (L) 04/02/2020 0546   PROT 4.0 (L) 04/02/2020 0546   ALBUMIN <1.0 (L) 04/02/2020 0546   AST 50 (H) 04/02/2020 0546   ALT 34 04/02/2020 0546   ALKPHOS 63 04/02/2020 0546   BILITOT 2.3 (H) 04/02/2020 0546   GFRNONAA >60 04/02/2020 0546   GFRAA >60 11/11/2016 1600   Lipase     Component Value Date/Time   LIPASE 20 03/05/2020 1452       Studies/Results: No results  found.  Anti-infectives: Anti-infectives (From admission, onward)   Start     Dose/Rate Route Frequency Ordered Stop   03/28/20 2200  meropenem (MERREM) 1 g in sodium chloride 0.9 % 100 mL IVPB        1 g 200 mL/hr over 30 Minutes Intravenous Every 8 hours 03/28/20 1809     03/28/20 1900  vancomycin (VANCOREADY) IVPB 750 mg/150 mL        750 mg 150 mL/hr over 60 Minutes Intravenous Every 12 hours 03/28/20 1809 04/02/20 2359   03/28/20 0900  vancomycin (VANCOREADY) IVPB 1500 mg/300 mL  Status:  Discontinued        1,500 mg 150 mL/hr over 120 Minutes Intravenous Every 24 hours 03/27/20 0750 03/27/20 1028   03/27/20 1800  vancomycin (VANCOREADY) IVPB 1500 mg/300 mL  Status:  Discontinued        1,500 mg 150 mL/hr over 120 Minutes Intravenous Every 24 hours 03/08/2020 1732 03/27/20 0750   03/27/20 0830  vancomycin (VANCOREADY) IVPB 1750 mg/350 mL        1,750 mg 175 mL/hr over 120 Minutes Intravenous STAT 03/27/20 0750 03/27/20 1026   03/07/2020 1830  vancomycin (VANCOREADY) IVPB 1750 mg/350 mL  Status:  Discontinued        1,750 mg 175 mL/hr over 120 Minutes Intravenous  Once 03/05/2020 1732 03/27/20 0750   03/21/20 0200  piperacillin-tazobactam (ZOSYN) IVPB 3.375 g  Status:  Discontinued        3.375 g 12.5 mL/hr over 240 Minutes Intravenous Every 8 hours 03/21/20 0007 03/28/20 1711   03/23/2020 0600  ceFAZolin (ANCEF) IVPB 2g/100 mL premix        2 g 200 mL/hr over 30 Minutes Intravenous On call to O.R. 03/18/20 1334 03/20/20 0559   04/03/2020 0600  piperacillin-tazobactam (ZOSYN) IVPB 3.375 g  Status:  Discontinued        3.375 g 12.5 mL/hr over 240 Minutes Intravenous Every 8 hours 03/11/2020 0046 03/15/20 1109   03/11/2020 2330  piperacillin-tazobactam (ZOSYN) IVPB 3.375 g        3.375 g 100 mL/hr over 30 Minutes Intravenous  Once 03/21/2020 2317 03/05/2020 0131       Assessment/Plan GERD IDA PMH Bilroth II for bleeding gastric ulcers ABL anemia -relatively stable. A. Fib with RVR -  on heparin and diltiazem gtts  Symptomatic cholelithiasis Choledocholithiasis without evidence of acute cholecystitis  S/p ERCP, placement of CBD stent 12/10 [name] -unable to perform a sphincterotomy and stone extraction due to anatomy s/p Billroth II S/plaparoscopic cholecystectomy12/16 Dr. Janee Morn Postoperative bile leak - POD#14/7 - CT scan 12/17 revealed moderate volumeperihepatic free fluid containing foci of air with non organized fluid and air in the gallbladder fossaconcerning for biloma - s/p IR drain placement 12/18 with yield high volume bilious fluid -s/p ERCP with sphincterotomy/sphincteroplasty, stent placement and lithotripsy 12/21 by Dr. Meridee Score - Tbili stable at 2.3 - drain output remainsbilious, moderate to low volume bile leak - OR for laparoscopic exploration due to increased free air on Ct 12/23, washout and drain placement 12/23 by Dr. Corliss Skains, had new drain placed in perihepatic region which was undrained by IR drain.  -patient now DNR, family discussing transition to comfort care.  VDRF: appears to be in moderate ARDs. Per CCM.  ID: zosyn 12/18>>at least 12/27, aspirated on induction in OR 12/23, monitor clinically, already on zosyn FEN:IVF,TPN, anticipatedileus, but can trial trickle TF when passing flatus/BM(not yet)   Drain output is relatively low volume. No further intervention indicated for this at this point regardless of GOC decision.    LOS: 20 days    Jorge Evans , MD [facility] 04/02/2020, 9:36 AM Please see Amion for pager number during day hours 7:00am-4:30pm or 7:00am -11:30am on weekends

## 2020-04-02 NOTE — Plan of Care (Signed)
Sister updated at bedside earlier prior to Dr. Eliot Ford exam, who also updated his sister. Wife [place] updated via phone. I let her know that his sister has been updated at bedside and I answered her questions to the best of her ability. She is planning for terminal extubation on Saturday when his daughter can be present from out of town.   Steffanie Dunn, DO 04/02/20 5:09 PM [facility]

## 2020-04-03 DIAGNOSIS — K8050 Calculus of bile duct without cholangitis or cholecystitis without obstruction: Secondary | ICD-10-CM | POA: Diagnosis not present

## 2020-04-03 LAB — CBC
HCT: 27.6 % — ABNORMAL LOW (ref 39.0–52.0)
Hemoglobin: 9 g/dL — ABNORMAL LOW (ref 13.0–17.0)
MCH: 29.1 pg (ref 26.0–34.0)
MCHC: 32.6 g/dL (ref 30.0–36.0)
MCV: 89.3 fL (ref 80.0–100.0)
Platelets: 84 10*3/uL — ABNORMAL LOW (ref 150–400)
RBC: 3.09 MIL/uL — ABNORMAL LOW (ref 4.22–5.81)
RDW: 14.9 % (ref 11.5–15.5)
WBC: 12 10*3/uL — ABNORMAL HIGH (ref 4.0–10.5)
nRBC: 0.4 % — ABNORMAL HIGH (ref 0.0–0.2)

## 2020-04-03 LAB — GLUCOSE, CAPILLARY
Glucose-Capillary: 153 mg/dL — ABNORMAL HIGH (ref 70–99)
Glucose-Capillary: 140 mg/dL — ABNORMAL HIGH (ref 70–99)
Glucose-Capillary: 149 mg/dL — ABNORMAL HIGH (ref 70–99)
Glucose-Capillary: 99 mg/dL (ref 70–99)

## 2020-04-03 NOTE — Progress Notes (Signed)
8 Days Post-Op  Subjective: Sedated on vent.  Drains with bilious output, hopefully controlling biliary leak.   Objective: Vital signs in last 24 hours: Temp:  [96.8 F (36 C)-98.96 F (37.2 C)] 98.96 F (37.2 C) (12/31 0900) Pulse Rate:  [67-114] 98 (12/31 0900) Resp:  [26-35] 33 (12/31 0900) BP: (97-174)/(50-89) 154/67 (12/31 0900) SpO2:  [92 %-99 %] 96 % (12/31 1131) FiO2 (%):  [70 %] 70 % (12/31 1131) Last BM Date: 03/25/20  Intake/Output from previous day: 12/30 0701 - 12/31 0700 In: 3590.5 [I.V.:2885.7; IV Piggyback:704.8] Out: 3555 [Urine:3300; Emesis/NG output:20; Drains:235] Intake/Output this shift: No intake/output data recorded.  PE: Abd: soft, but distended, incisions c/d/i. IR drain serous, surgical drain bilious, total op 235 last 24h from surgical drain.   Lab Results:  Recent Labs    04/02/20 0546 04/03/20 0525  WBC 8.3 12.0*  HGB 7.7* 9.0*  HCT 24.8* 27.6*  PLT 73* 84*   BMET Recent Labs    04/01/20 1252 04/02/20 0546  NA 147* 148*  K 3.8 3.5  CL 100 98  CO2 38* 37*  GLUCOSE 145* 175*  BUN 45* 51*  CREATININE 1.06 1.09  CALCIUM 7.3* 7.3*   PT/INR No results for input(s): LABPROT, INR in the last 72 hours. CMP     Component Value Date/Time   NA 148 (H) 04/02/2020 0546   K 3.5 04/02/2020 0546   CL 98 04/02/2020 0546   CO2 37 (H) 04/02/2020 0546   GLUCOSE 175 (H) 04/02/2020 0546   BUN 51 (H) 04/02/2020 0546   CREATININE 1.09 04/02/2020 0546   CREATININE 0.93 11/19/2019 0842   CALCIUM 7.3 (L) 04/02/2020 0546   PROT 4.0 (L) 04/02/2020 0546   ALBUMIN <1.0 (L) 04/02/2020 0546   AST 50 (H) 04/02/2020 0546   ALT 34 04/02/2020 0546   ALKPHOS 63 04/02/2020 0546   BILITOT 2.3 (H) 04/02/2020 0546   GFRNONAA >60 04/02/2020 0546   GFRAA >60 11/11/2016 1600   Lipase     Component Value Date/Time   LIPASE 20 03/09/2020 1452       Studies/Results: No results found.  Anti-infectives: Anti-infectives (From admission, onward)    Start     Dose/Rate Route Frequency Ordered Stop   03/28/20 2200  meropenem (MERREM) 1 g in sodium chloride 0.9 % 100 mL IVPB        1 g 200 mL/hr over 30 Minutes Intravenous Every 8 hours 03/28/20 1809     03/28/20 1900  vancomycin (VANCOREADY) IVPB 750 mg/150 mL        750 mg 150 mL/hr over 60 Minutes Intravenous Every 12 hours 03/28/20 1809 04/02/20 1934   03/28/20 0900  vancomycin (VANCOREADY) IVPB 1500 mg/300 mL  Status:  Discontinued        1,500 mg 150 mL/hr over 120 Minutes Intravenous Every 24 hours 03/27/20 0750 03/27/20 1028   03/27/20 1800  vancomycin (VANCOREADY) IVPB 1500 mg/300 mL  Status:  Discontinued        1,500 mg 150 mL/hr over 120 Minutes Intravenous Every 24 hours 03/05/2020 1732 03/27/20 0750   03/27/20 0830  vancomycin (VANCOREADY) IVPB 1750 mg/350 mL        1,750 mg 175 mL/hr over 120 Minutes Intravenous STAT 03/27/20 0750 03/27/20 1026   03/25/2020 1830  vancomycin (VANCOREADY) IVPB 1750 mg/350 mL  Status:  Discontinued        1,750 mg 175 mL/hr over 120 Minutes Intravenous  Once 03/21/2020 1732 03/27/20 0750  03/21/20 0200  piperacillin-tazobactam (ZOSYN) IVPB 3.375 g  Status:  Discontinued        3.375 g 12.5 mL/hr over 240 Minutes Intravenous Every 8 hours 03/21/20 0007 03/28/20 1711   03/10/2020 0600  ceFAZolin (ANCEF) IVPB 2g/100 mL premix        2 g 200 mL/hr over 30 Minutes Intravenous On call to O.R. 03/18/20 1334 03/20/20 0559   03/30/2020 0600  piperacillin-tazobactam (ZOSYN) IVPB 3.375 g  Status:  Discontinued        3.375 g 12.5 mL/hr over 240 Minutes Intravenous Every 8 hours 03/07/2020 0046 03/15/20 1109   03/27/2020 2330  piperacillin-tazobactam (ZOSYN) IVPB 3.375 g        3.375 g 100 mL/hr over 30 Minutes Intravenous  Once 03/14/2020 2317 03/17/2020 0131       Assessment/Plan GERD IDA PMH Bilroth II for bleeding gastric ulcers ABL anemia -relatively stable. A. Fib with RVR - on heparin and diltiazem gtts  Symptomatic  cholelithiasis Choledocholithiasis without evidence of acute cholecystitis  S/p ERCP, placement of CBD stent 12/10 [name] -unable to perform a sphincterotomy and stone extraction due to anatomy s/p Billroth II S/plaparoscopic cholecystectomy12/16 Dr. Janee Morn Postoperative bile leak - CT scan 12/17 revealed moderate volumeperihepatic free fluid containing foci of air with non organized fluid and air in the gallbladder fossaconcerning for biloma - s/p IR drain placement 12/18 with yield high volume bilious fluid -s/p ERCP with sphincterotomy/sphincteroplasty, stent placement and lithotripsy 12/21 by Dr. Meridee Score - Tbili stable at 2.3 - drain output remainsbilious, moderate to low volume bile leak - OR for laparoscopic exploration due to increased free air on Ct 12/23, washout and drain placement 12/23 by Dr. Corliss Skains, had new drain placed in perihepatic region which was undrained by IR drain.  -patient now DNR, family discussing transition to comfort care - If the drainage is a able to be controlled with these drains his abdominal disease should improve, however he is facing multiple issues and we must consider what his baseline is.  VDRF: appears to be in moderate ARDs. Per CCM.  ID: zosyn 12/18>>at least 12/27, aspirated on induction in OR 12/23, monitor clinically, already on zosyn FEN:IVF,TPN, anticipatedileus, but can trial trickle TF when passing flatus/BM(not yet)   LOS: 21 days    Quentin Ore , MD [facility] 04/03/2020, 11:39 AM Please see Amion for pager number during day hours 7:00am-4:30pm or 7:00am -11:30am on weekends

## 2020-04-03 NOTE — Progress Notes (Signed)
Pharmacy Antibiotic Note  Jorge Evans. is a 69 y.o. male admitted on 03/10/2020 s/p cholecystectomy + stent placement with drain, also aspiration pna, currently on zosyn and now with new fevers CCM to broaden coverage for now.  Pharmacy has been consulted for vancomycin and Merrem dosing.   Continues on Merrem for IAI, vancomycin completed 12/30, today is day #7 on Merrem and family deciding GOC soon  Plan: Merrem 1g IV every 8h Monitor renal function, clinical progression and GOC for LOT  Height: 5' 5.75" (167 cm) Weight: 69.9 kg (154 lb) IBW/kg (Calculated) : 63.22  Temp (24hrs), Avg:97.5 F (36.4 C), Min:96.8 F (36 C), Max:98.24 F (36.8 C)  Recent Labs  Lab 03/27/20 0859 03/28/20 0512 03/29/20 0445 03/30/20 0452 03/31/20 0500 04/01/20 0500 04/01/20 1252 04/02/20 0546 04/03/20 0525  WBC  --    < > 12.5* 9.1 11.7* 10.1  --  8.3 12.0*  CREATININE  --    < > 1.13 1.02 1.02  --  1.06 1.09  --   LATICACIDVEN 3.0*  --   --   --   --   --   --   --   --    < > = values in this interval not displayed.    Estimated Creatinine Clearance: 56.4 mL/min (by C-G formula based on SCr of 1.09 mg/dL).    Allergies  Allergen Reactions  . Other Itching  . Demerol [Meperidine] Rash    Zosyn 12/18>>12/25 Vanc 12/23>>12/24; 12/25>>12/30 Merrem 12/25>>  12/24 bcx negative 12/19 mrsa neg  12/18 perihep abscess- multiple orgs  Daylene Posey, PharmD Clinical Pharmacist ED Pharmacist Phone # [phone] 04/03/2020 7:45 AM

## 2020-04-03 NOTE — Progress Notes (Signed)
NAME:  Jorge Evans., MRN:  [id number], DOB:  1950-07-22, LOS: 13 ADMISSION DATE:  04/01/2020, CONSULTATION DATE:  03/29/2020 REFERRING MD:  Dr. Garald Braver, CHIEF COMPLAINT:  Respiratory failure    Brief History:  69 year old male presented originally 12/9 with complaints of abdominal pain, nausea, and vomiting, imaging concerning for choledocholithiasis underwent ERCP with stent placement later followed with cholecystectomy.  Overnight 12/23 patient developed worsening abdominal pain patient was taken for diagnostic laparoscopy with placement of intra-abdominal drain.  She remained intubated postoperatively due to respiratory compromise including questionable aspiration   Past Medical History   Past Medical History:  Diagnosis Date  . Anemia   . GERD (gastroesophageal reflux disease)     Significant Hospital Events   -ercp with cbd stent 12/10  -Cholecystectomy 12/16  -IR drain for bile leak 12/18  -ercp and stent placement 12/21 -diagnostic lap 12/23 and intra-abdominal drain, A. fib with RVR requiring diltiazem, septic shock requiring phenylephrine 12/24: More hypoxic requiring, chest x-ray showing bilateral airspace disease worrisome for evolving ARDS, placed on ARDS protocol, added fentanyl infusion.  Metabolic acidosis,, with ongoing septic shock.  Off calcium channel blocker.  Titrating phenylephrine.  Additional volume resuscitation.  Had bronchoscopy.,  Had significant clear bilious aspirate which was lavaged.  Placed on bicarbonate drip for ongoing acidosis 12/25: Acid-base improved.  CVP now 23, off pressors.  One-time Lasix.  Trying to minimize fluid administration.  Still on high PEEP/FiO2 spiking fever in excess of 103, change antibiotic coverage to meropenem and vancomycin.12/26: Hemodynamically stable overnight, no fever spike, white blood cell count significantly improved.  Consults:  GI Surgery IR Ccm [facility] cardiology  Procedures:  Endotracheal tube  12/23  Significant Diagnostic Tests:  12/9 ct abd/pelvis: 1. Interval development of moderate intra and extrahepatic biliary dilatation with multiple rounded filling defects within the mid to distal common duct, suspicious for choledocholithiasis. Suggest correlation with LFTs, further evaluation with MRCP may be obtained. 2. Streaky hyperdense foci within the bladder, suspect that this is due to excreted contrast as opposed to bladder mass. 3. Status post partial gastrectomy with no evidence for an obstruction. 4. Aortic atherosclerosis.  12/17 ct abd/pelvis: 1. Interval cholecystectomy. Moderate volume of perihepatic free fluid containing foci of air with non organized fluid and air in the gallbladder fossa. Given the amount of fluid, biloma is favored over postoperative seroma. Sterility is indeterminate by imaging. Consider nuclear medicine hepatic biliary scan to assess for bile leak. 2. Plastic common bile duct stent in place. Suspected residual 9 mm stone at the proximal aspect of the stent. Common bile duct dilatation of 16 mm, with improved but persistent intrahepatic biliary dilatation on the left. Pneumobilia related to recent procedure. 3. Small right and trace left pleural effusions. 4. Absent renal excretion on delayed phase imaging, can be seen with renal dysfunction. 5. Mild distal colonic diverticulosis without diverticulitis. 12/20 echo: LVEF 65-70% RV normal 12/22 ct abdomen pelvis: 1. Moderate amount of intra-abdominal free air, increased in severity when compared to the prior study. While this is likely secondary to the patient's recent surgical intervention, the presence of a perforated hollow viscus cannot completely be excluded. 2. Additional findings consistent with a postoperative ileus. 3. Right-sided surgical drain positioning, as described above, with near-total resolution of the perihepatic fluid seen on the prior study. 4. Interval distal  pancreatic duct stent placement since the prior exam. 5. Moderate severity anasarca. 6. Small hiatal hernia. 7. Aortic atherosclerosis.  Micro Data:  12/10 sars2: neg 12/18 perihepatic  biloma: Multiple organisms present, none predominant, no anaerobes isolated.  Antimicrobials:  Zosyn 12/17-> 12/25 Vancomycin 12/ 23>> Meropenem 12/25>>> Interim history/subjective:  No significant change Objective   Blood pressure (!) 167/61, pulse 99, temperature 98.24 F (36.8 C), resp. rate (!) 28, height 5' 5.75" (1.67 m), weight 69.9 kg, SpO2 98 %.    Vent Mode: PRVC FiO2 (%):  [70 %] 70 % Set Rate:  [32 bmp] 32 bmp Vt Set:  [380 mL] 380 mL PEEP:  [12 cmH20] 12 cmH20 Plateau Pressure:  [23 cmH20-25 cmH20] 24 cmH20   Intake/Output Summary (Last 24 hours) at 04/03/2020 0857 Last data filed at 04/03/2020 0700 Gross per 24 hour  Intake 3371.88 ml  Output 3205 ml  Net 166.88 ml   Net for admission +22L  Filed Weights   03/07/2020 1442 03/05/2020 1441  Weight: 69.9 kg 69.9 kg    Examination:  General: Ill-appearing male is heavily sedated on full mechanical ventilatory support HEENT: Endotracheal tube and NG tube are in place Neuro: Heavily sedated without response to verbal stimuli CV: Heart sounds are regular at 103 bpm PULM: Coarse rhonchi bilaterally vent FIO2 70% PEEP 12 RATE 32 VT 380  GI: No bowel sounds multiple grade GU: Extremities: Anasarca with weeping wounds Skin: no rashes or lesions   Resolved Hospital Problem list   Septic shock resolved Worsening lactic acidosis with anion gap metabolic acidosis.  Assessment & Plan:  Acute choledocholithiasis, status post cholecystectomy complicated by bile leak and abdominal peritonitis 12/18 requiring stenting on 12/21, and subsequent return to the OR 12/23 with washout, lysis of adhesion, and new drain placement Plan Per general surgery Remains n.p.o. NG tube to suction .  Currently on TPN consider stopping once his  bag is complete since due to planned terminal extubation within 24 hours   Severe sepsis secondary to abdominal peritonitis; back in shock on 12/27 likely due to sedation requirements. New/ recurrent fever overnight with increasing WBC>> downtrending and fever resolved. -surgical abscess culture: Multiple organisms present/polymicrobial Plan Wound care per surgery and IR Continue antimicrobial therapy with meropenem and vancomycin Vasopressors as needed to keep systolic blood pressure greater than 90 No escalation of care plan is for family to come in Saturday for terminal wean    Severe anasarca following aggressive volume resuscitation Plan Currently on Lasix drip remains positive I&O's  Acute hypoxic respiratory failure 2/2 aspiration PNA and ARDS Plan  Continue heavy sedation for low stretch ARDS protocol Not responding to Lasix Weanable with FiO2 70%     Afib with RVR; new- onset this admission, likely due to critical illness Plan  Adequate sedation Holding Lovenox due to thrombocytopenia Continue digoxin Heart rate currently 103   Anemia due to critical illness Recent Labs    04/02/20 0546 04/03/20 0525  HGB 7.7* 9.0*  .plat  Thrombocytopenia, likely due to critical illness- worsening 73->84 Transfuse per protocol Monitor platelets  Hyperglycemia, controlled CBG (last 3)  Recent Labs    04/02/20 1828 04/02/20 2327 04/03/20 0608  GLUCAP 136* 145* 153*     Continue sliding scale insulin protocol  Severe protein energy malnutrition, undetectable pre-albumin Anasarca  Intake/Output Summary (Last 24 hours) at 04/03/2020 0859 Last data filed at 04/03/2020 0700 Gross per 24 hour  Intake 3371.88 ml  Output 3205 ml  Net 166.88 ml    Currently on TPN consider stopping Aggressive diuresis without response to Lasix  Hypocalcemia Given calcium on 04/02/2020  GoC Now full DNR.  Family is arriving for expected terminal  extubation on 04/10/2020.  We  will continue aggressive care at this time with no elevation of care Best practice:  Diet: npo TNA per surg Pain/Anxiety/Delirium protocol (if indicated): per protocol VAP protocol (if indicated): per protocol DVT prophylaxis:  Iovenox GI prophylaxis: ppi Glucose control: basal bolus insulin Mobility: bedrest Code Status: FULL Family Communication: wife disposition: ICU Goals of care addressed 12/28 with wife, RN  App cct 34 min   Brett Canales Amalya Salmons ACNP Acute Care Nurse Practitioner [facility] Please consult Amion 04/03/2020, 8:57 AM

## 2020-04-03 NOTE — Progress Notes (Signed)
Patient ID: Gar Ponto., male   DOB: 1950-12-31, 69 y.o.   MRN: [id number]     Progress Note  Patient Name: Jorge Evans. Date of Encounter: 04/03/2020  Primary Cardiologist: Elouise Munroe, MD   Subjective   Remains intubated and sedated  Inpatient Medications    Scheduled Meds: . chlorhexidine gluconate (MEDLINE KIT)  15 mL Mouth Rinse BID  . Chlorhexidine Gluconate Cloth  6 each Topical Daily  . digoxin  0.125 mg Intravenous Daily  . docusate  100 mg Per Tube BID  . insulin aspart  0-9 Units Subcutaneous Q6H  . insulin glargine  5 Units Subcutaneous Daily  . lidocaine  1 patch Transdermal Q24H  . mouth rinse  15 mL Mouth Rinse 10 times per day  . pantoprazole (PROTONIX) IV  40 mg Intravenous Q12H  . polyethylene glycol  17 g Per Tube Daily  . senna-docusate  2 tablet Per Tube BID  . sodium chloride flush  10-40 mL Intracatheter Q12H  . sodium chloride flush  5 mL Intracatheter Q8H   Continuous Infusions: . sodium chloride Stopped (04/02/20 0538)  . dexmedetomidine (PRECEDEX) IV infusion 0.9 mcg/kg/hr (04/03/20 0700)  . fentaNYL infusion INTRAVENOUS 100 mcg/hr (04/03/20 0700)  . furosemide (LASIX) 200 mg in dextrose 5% 100 mL (13m/mL) infusion 10 mg/hr (04/03/20 0700)  . meropenem (MERREM) IV Stopped (04/03/20 09323  . norepinephrine (LEVOPHED) Adult infusion Stopped (04/02/20 1411)  . phenylephrine (NEO-SYNEPHRINE) Adult infusion Stopped (03/28/20 05573  . propofol (DIPRIVAN) infusion 25 mcg/kg/min (04/03/20 0700)  . TPN ADULT (ION) 75 mL/hr at 04/03/20 0700   PRN Meds: acetaminophen **OR** acetaminophen, fentaNYL, fentaNYL (SUBLIMAZE) injection, midazolam, midazolam, sodium chloride flush   Vital Signs    Vitals:   04/03/20 0700 04/03/20 0800 04/03/20 0835 04/03/20 0900  BP: (!) 167/61 (!) 154/71  (!) 154/67  Pulse: 99 (!) 104  98  Resp: (!) 28 (!) 30  (!) 33  Temp: 98.24 F (36.8 C) 98.6 F (37 C)  98.96 F (37.2 C)  TempSrc:      SpO2:  97% 96% 98% 98%  Weight:      Height:        Intake/Output Summary (Last 24 hours) at 04/03/2020 0919 Last data filed at 04/03/2020 0700 Gross per 24 hour  Intake 3257.98 ml  Output 3105 ml  Net 152.98 ml   Filed Weights   03/06/2020 1442 03/21/2020 1441  Weight: 69.9 kg 69.9 kg    Telemetry    nsr with sinus tachy/PVC's and NS AT - Personally Reviewed  ECG    none - Personally Reviewed  Physical Exam   GEN: intubated and sedated   Neck: ET tube in place Cardiac: RRR, no murmurs, rubs, or gallops.  Respiratory: with scattered rales GI: soft, marked decreased bowel sounds MS: No edema; No deformity. Neuro:  Nonfocal  Psych: Normal affect   Labs    Chemistry Recent Labs  Lab 03/29/20 0445 03/29/20 1123 03/30/20 0452 03/31/20 0500 04/01/20 1252 04/02/20 0546  NA 138   < > 141 142 147* 148*  K 4.0   < > 4.1 4.0 3.8 3.5  CL 104  --  104 104 100 98  CO2 26  --  28 28 38* 37*  GLUCOSE 178*  --  199* 161* 145* 175*  BUN 35*  --  37* 41* 45* 51*  CREATININE 1.13  --  1.02 1.02 1.06 1.09  CALCIUM 6.3*  --  6.9* 6.5* 7.3* 7.3*  PROT 3.9*  --  4.1*  --   --  4.0*  ALBUMIN 1.1*  --  1.0*  --   --  <1.0*  AST 182*  --  93*  --   --  50*  ALT 187*  --  128*  --   --  34  ALKPHOS 76  --  68  --   --  63  BILITOT 1.8*  --  2.2*  --   --  2.3*  GFRNONAA >60  --  >60 >60 >60 >60  ANIONGAP 8  --  _0 < > = values in this interval not displayed.     Hematology Recent Labs  Lab 04/01/20 0500 04/02/20 0546 04/03/20 0525  WBC 10.1 8.3 12.0*  RBC 2.83* 2.72* 3.09*  HGB 8.2* 7.7* 9.0*  HCT 25.8* 24.8* 27.6*  MCV 91.2 91.2 89.3  MCH 29.0 28.3 29.1  MCHC 31.8 31.0 32.6  RDW 14.6 15.0 14.9  PLT 91* 73* 84*    Cardiac EnzymesNo results for input(s): TROPONINI in the last 168 hours. No results for input(s): TROPIPOC in the last 168 hours.   BNPNo results for input(s): BNP, PROBNP in the last 168 hours.   DDimer No results for input(s): DDIMER in the last  168 hours.   Radiology    No results found.  Cardiac Studies   none  Patient Profile     69 y.o. male admitted with abdominal pain, choledocholithiasis, s/p cholecystectomy, with bile leak and peritonitis resulting in respiratory failure. Multiple atrial arrhythmias.   Assessment & Plan    1. Atrial tachycardia - he is mostly NSR/ST and will continue IV digoxin. Watch for dig tox. Would check a digoxin level early next week. 2. VDRF - as per CCM service. Remains intubated. 3. Shock - off pressors. Continue supportive care.     For questions or updates, please contact [place] Please consult [email or web address] for contact info under Cardiology/STEMI.      Signed, Cristopher Peru, MD  04/03/2020, 9:19 AM

## 2020-04-04 ENCOUNTER — Inpatient Hospital Stay (HOSPITAL_COMMUNITY): Payer: Medicare Other

## 2020-04-04 LAB — CBC
HCT: 27.1 % — ABNORMAL LOW (ref 39.0–52.0)
Hemoglobin: 8.3 g/dL — ABNORMAL LOW (ref 13.0–17.0)
MCH: 27.4 pg (ref 26.0–34.0)
MCHC: 30.6 g/dL (ref 30.0–36.0)
MCV: 89.4 fL (ref 80.0–100.0)
Platelets: 92 10*3/uL — ABNORMAL LOW (ref 150–400)
RBC: 3.03 MIL/uL — ABNORMAL LOW (ref 4.22–5.81)
RDW: 14.9 % (ref 11.5–15.5)
WBC: 10.3 10*3/uL (ref 4.0–10.5)
nRBC: 0.3 % — ABNORMAL HIGH (ref 0.0–0.2)

## 2020-04-04 LAB — BASIC METABOLIC PANEL
Anion gap: 14 (ref 5–15)
BUN: 69 mg/dL — ABNORMAL HIGH (ref 8–23)
CO2: 42 mmol/L — ABNORMAL HIGH (ref 22–32)
Calcium: 7.6 mg/dL — ABNORMAL LOW (ref 8.9–10.3)
Chloride: 99 mmol/L (ref 98–111)
Creatinine, Ser: 0.95 mg/dL (ref 0.61–1.24)
GFR, Estimated: >60 mL/min (ref >60)
Glucose, Bld: 111 mg/dL — ABNORMAL HIGH (ref 70–99)
Potassium: 3.2 mmol/L — ABNORMAL LOW (ref 3.5–5.1)
Sodium: 155 mmol/L — ABNORMAL HIGH (ref 135–145)

## 2020-04-04 LAB — MAGNESIUM: Magnesium: 2.1 mg/dL (ref 1.7–2.4)

## 2020-04-04 LAB — GLUCOSE, CAPILLARY: Glucose-Capillary: 102 mg/dL — ABNORMAL HIGH (ref 70–99)

## 2020-04-04 LAB — PHOSPHORUS: Phosphorus: 5 mg/dL — ABNORMAL HIGH (ref 2.5–4.6)

## 2020-04-04 MED ORDER — GLYCOPYRROLATE 0.2 MG/ML IJ SOLN: 0.2000 mg | INTRAMUSCULAR | Status: DC | PRN

## 2020-04-04 MED ORDER — GLYCOPYRROLATE 1 MG PO TABS
1.0000 mg | ORAL_TABLET | ORAL | Status: DC | PRN
  Filled 2020-04-04: qty 1

## 2020-04-04 MED ORDER — POTASSIUM CHLORIDE 10 MEQ/50ML IV SOLN: 10.0000 meq | INTRAVENOUS | Status: DC

## 2020-04-04 MED ORDER — ACETAMINOPHEN 650 MG RE SUPP: 650.0000 mg | Freq: Four times a day (QID) | RECTAL | Status: DC | PRN

## 2020-04-04 MED ORDER — GLYCOPYRROLATE 0.2 MG/ML IJ SOLN
0.2000 mg | INTRAMUSCULAR | Status: DC | PRN
  Administered 2020-04-04: 0.2 mg via INTRAVENOUS
  Filled 2020-04-04: qty 1

## 2020-04-04 MED ORDER — POLYVINYL ALCOHOL 1.4 % OP SOLN
1.0000 [drp] | Freq: Four times a day (QID) | OPHTHALMIC | Status: DC | PRN
  Filled 2020-04-04: qty 15

## 2020-04-04 MED ORDER — MIDAZOLAM 50MG/50ML (1MG/ML) PREMIX INFUSION
0.0000 mg/h | INTRAVENOUS | Status: DC
  Administered 2020-04-04: 4 mg/h via INTRAVENOUS
  Filled 2020-04-04: qty 50

## 2020-04-04 MED ORDER — MIDAZOLAM HCL 2 MG/2ML IJ SOLN: 1.0000 mg | INTRAMUSCULAR | Status: DC | PRN

## 2020-04-04 MED ORDER — ACETAMINOPHEN 325 MG PO TABS: 650.0000 mg | ORAL_TABLET | Freq: Four times a day (QID) | ORAL | Status: DC | PRN

## 2020-04-04 MED ORDER — DIPHENHYDRAMINE HCL 50 MG/ML IJ SOLN: 25.0000 mg | INTRAMUSCULAR | Status: DC | PRN

## 2020-04-04 MED ORDER — POTASSIUM CHLORIDE 20 MEQ PO PACK: 20.0000 meq | PACK | ORAL | Status: DC

## 2020-04-04 MED ORDER — MIDAZOLAM BOLUS VIA INFUSION (WITHDRAWAL LIFE SUSTAINING TX)
2.0000 mg | INTRAVENOUS | Status: DC | PRN
Start: 2020-04-04 — End: 2020-04-04
  Filled 2020-04-04: qty 2

## 2020-04-04 MED ORDER — DEXTROSE 5 % IV SOLN: INTRAVENOUS | Status: DC

## 2020-04-04 DEATH — deceased

## 2020-04-23 ENCOUNTER — Telehealth: Payer: Self-pay | Admitting: Pulmonary Disease

## 2020-04-24 NOTE — Telephone Encounter (Signed)
Done

## 2020-05-05 NOTE — Death Summary Note (Signed)
DEATH SUMMARY   Patient Details  Name: Jorge Evans. MRN: [id number] DOB: 1950-11-11  Admission/Discharge Information   Admit Date:  04-11-20  Date of Death: Date of Death: May 04, 2020  Time of Death: Time of Death: 65  Length of Stay: 2022-07-24  Referring Physician: Alycia Rossetti, MD   Reason(s) for Hospitalization  Acute cholecystitis  Diagnoses  Preliminary cause of death:   Septic shock Secondary Diagnoses (including complications and co-morbidities):  Principal Problem:   Choledocholithiasis Active Problems:   GERD (gastroesophageal reflux disease)   Iron deficiency anemia   Elevated LFTs   Atrial fibrillation with RVR (HCC)   Protein-calorie malnutrition, severe   ARDS (adult respiratory distress syndrome) (HCC)   Aspiration into airway   Common bile duct (CBD) obstruction   Septic shock (HCC)   Postoperative bile leak acute cholecystitis  Brief Hospital Course (including significant findings, care, treatment, and services provided and events leading to death)  Jorge Brigham. is a 70 y.o. year old male who was admitted with with complaints of abdominal pain, nausea, and vomiting, imaging concerning forcholedocholithiasisunderwent ERCP with stent placement later followed with cholecystectomy.Overnight 12/23 patient developed worsening abdominal pain patient was taken for diagnostic laparoscopy with placement of intra-abdominal drain.He remained intubated postoperatively due to respiratory compromise including questionable aspiration  Hospital course -ercpwith cbd stent 12/10  -Cholecystectomy 12/16  -IR drain for bile leak 12/18  -ercp and stent placement 12/21 -diagnostic lap 12/23 and intra-abdominal drain, A. fib with RVR requiring diltiazem, septic shock requiring phenylephrine 12/24: More hypoxic requiring, chest x-ray showing bilateral airspace disease worrisome for evolving ARDS, placed on ARDS protocol, added fentanyl infusion. Metabolic  acidosis,, with ongoing septic shock. Off calcium channel blocker. Titrating phenylephrine. Additional volume resuscitation. Had bronchoscopy., Had significant clear bilious aspirate which was lavaged. Placed on bicarbonate drip for ongoing acidosis 12/25: Acid-base improved. CVP now 23, off pressors. One-time Lasix. Trying to minimize fluid administration. Still on high PEEP/FiO2 spiking fever in excess of 103, change antibiotic coverage to meropenem and vancomycin.12/26: Hemodynamically stable overnight, no fever spike, white blood cell count significantly improved.  12/26-12/31 worsening ARDS, continued multi-organ failure, shock.  Family met with medical team on 12/31 and elected to proceed with withdrawal of care.  He died peacefully on 1/1 after support was withdrawn.    Pertinent Labs and Studies  Significant Diagnostic Studies CT ABDOMEN PELVIS WO CONTRAST  Result Date: 03/25/2020 CLINICAL DATA:  Postoperative abdominal pain. EXAM: CT ABDOMEN AND PELVIS WITHOUT CONTRAST TECHNIQUE: Multidetector CT imaging of the abdomen and pelvis was performed following the standard protocol without IV contrast. COMPARISON:  None. FINDINGS: Lower chest: Mild areas of atelectasis and/or infiltrate are seen within the bilateral lung bases, right greater than left. There is a small right pleural effusion. Hepatobiliary: No focal liver abnormality is seen. A moderate amount of pneumobilia is noted. Status post cholecystectomy. A distal common bile duct stent is in place. Pancreas: A distal pancreatic duct stent is seen. This represents a new finding when compared to the prior study. The remaining visualized portions of the pancreas are unremarkable Spleen: Normal in size without focal abnormality. Adrenals/Urinary Tract: Adrenal glands are unremarkable. Kidneys are normal, without renal calculi, focal lesion, or hydronephrosis. Bladder is unremarkable. Stomach/Bowel: There is a small hiatal hernia. Multiple  surgical clips are seen along the gastroesophageal junction. Appendix appears normal. Multiple dilated small bowel loops are seen within the abdomen and pelvis. A gradual transition zone is seen within the mid to lower right  abdomen. A moderate amount of intra-abdominal the free air is seen within the anterior aspect of the abdomen. This is increased in severity when compared to the prior study. Vascular/Lymphatic: Aortic atherosclerosis. No enlarged abdominal or pelvic lymph nodes. Reproductive: The prostate gland is mildly enlarged. Other: Moderate severity anasarca is seen along the lateral aspects of the abdominal and pelvic walls. A surgical drain is seen the entering via the anterolateral aspect of the abdomen on the right. Its distal end is seen adjacent to the lateral aspect of the right lobe of the liver. A trace amount of residual perihepatic fluid is seen within this region. A small amount of posterior pelvic free fluid is seen. Musculoskeletal: Degenerative changes seen throughout the lumbar spine. IMPRESSION: 1. Moderate amount of intra-abdominal free air, increased in severity when compared to the prior study. While this is likely secondary to the patient's recent surgical intervention, the presence of a perforated hollow viscus cannot completely be excluded. 2. Additional findings consistent with a postoperative ileus. 3. Right-sided surgical drain positioning, as described above, with near-total resolution of the perihepatic fluid seen on the prior study. 4. Interval distal pancreatic duct stent placement since the prior exam. 5. Moderate severity anasarca. 6. Small hiatal hernia. 7. Aortic atherosclerosis. Aortic Atherosclerosis (ICD10-I70.0). Electronically Signed   By: Virgina Norfolk M.D.   On: 03/25/2020 23:07   DG Abd 1 View  Result Date: 03/23/2020 CLINICAL DATA:  Bile duct obstruction.  Biliary stent position. EXAM: ABDOMEN - 1 VIEW COMPARISON:  03/17/2020 FINDINGS: Again noted is a  biliary stent. This biliary stent is stable positioning with compared to prior study. The bowel gas pattern is nonspecific with some gaseous distention of loops of small bowel scattered throughout the abdomen. There is no evidence for high-grade bowel obstruction. There is a percutaneous drain projecting over the right upper quadrant. There is likely some pneumobilia which is stable from prior CT. There are chronic changes of the patient's proximal left femur. IMPRESSION: 1. Stable positioning of the biliary stent. 2. Nonspecific bowel gas pattern with gaseous distention of loops of small bowel scattered throughout the abdomen. Electronically Signed   By: Constance Holster M.D.   On: 03/23/2020 23:54   DG Abd 1 View  Result Date: 03/17/2020 CLINICAL DATA:  Abdominal pain.  Vomiting. EXAM: ABDOMEN - 1 VIEW COMPARISON:  ERCP 03/15/2020.  CT 03/13/2020. FINDINGS: Surgical clips upper abdomen. Biliary stent noted in good anatomic position. No bowel distention or free air. Aortoiliac atherosclerotic vascular calcification. Lumbar spine degenerative changes scoliosis. Degenerative changes both hips. Stable deformity left hip. IMPRESSION: 1. Biliary stent noted in good anatomic position. No bowel distention or free air. 2. Aortoiliac atherosclerotic vascular disease. Electronically Signed   By: Marcello Moores  Register   On: 03/17/2020 07:34   CT ABDOMEN PELVIS W CONTRAST  Result Date: 03/20/2020 CLINICAL DATA:  Umbilical pain after laparoscopic cholecystectomy yesterday. EXAM: CT ABDOMEN AND PELVIS WITH CONTRAST TECHNIQUE: Multidetector CT imaging of the abdomen and pelvis was performed using the standard protocol following bolus administration of intravenous contrast. CONTRAST:  168mL OMNIPAQUE IOHEXOL 300 MG/ML  SOLN COMPARISON:  CT 03/06/2020 FINDINGS: Lower chest: Small right and trace left pleural effusion. Associated compressive atelectasis. There are coronary artery calcifications. Fluid distends the distal  esophagus. Hepatobiliary: Interval cholecystectomy with clips in the gallbladder fossa. Persistent but diminished biliary dilatation in the left lobe of the liver with new pneumobilia, likely sequela of ERCP. Plastic common bile duct stent. Common bile duct proximal to the stent measures  18 mm. Rounded density in the common bile duct measuring 9 mm may represent a retained stone at the proximal aspect of the stent, series 6, image 77. No focal hepatic lesion. Moderate volume perihepatic free fluid containing foci of air with non organized fluid and air in the gallbladder fossa. Pancreas: Parenchymal atrophy. No ductal dilatation or inflammation. Spleen: Normal in size without focal abnormality. Adrenals/Urinary Tract: No adrenal nodule. No hydronephrosis or perinephric edema. Homogeneous renal enhancement . Absent renal excretion on delayed phase imaging. Slight bladder wall thickening and questionable bladder diverticulum at the dome, unchanged. Stomach/Bowel: Fluid distends the distal esophagus. Surgical clips at the gastroesophageal junction. Stomach is decompressed. Prior gastric surgery with suspected gastrojejunostomy. Decompressed small bowel without wall thickening or obstruction. Normal appendix. Small to moderate colonic stool burden without colonic inflammation. Mild distal diverticulosis without diverticulitis. Vascular/Lymphatic: Aortic and branch atherosclerosis. No aortic aneurysm. No portal vein thrombosis. No abdominopelvic adenopathy. Reproductive: Prostate is unremarkable. Other: Moderate volume of perihepatic fluid which measures simple fluid density. Multiple foci of internal air. Ill-defined air fluid collection in the gallbladder fossa without peripherally enhancing collection. Occasional foci of free air in the upper abdomen not unexpected post recent surgery. Scattered air in the subcutaneous tissues from recent laparoscopy. Trace free fluid tracks into the pelvis. Musculoskeletal: S shaped  scoliosis and multilevel degenerative change in the spine. Chronic dysplastic deformity of the left acetabulum and proximal femur. There are no acute or suspicious osseous abnormalities. IMPRESSION: 1. Interval cholecystectomy. Moderate volume of perihepatic free fluid containing foci of air with non organized fluid and air in the gallbladder fossa. Given the amount of fluid, biloma is favored over postoperative seroma. Sterility is indeterminate by imaging. Consider nuclear medicine hepatic biliary scan to assess for bile leak. 2. Plastic common bile duct stent in place. Suspected residual 9 mm stone at the proximal aspect of the stent. Common bile duct dilatation of 16 mm, with improved but persistent intrahepatic biliary dilatation on the left. Pneumobilia related to recent procedure. 3. Small right and trace left pleural effusions. 4. Absent renal excretion on delayed phase imaging, can be seen with renal dysfunction. 5. Mild distal colonic diverticulosis without diverticulitis. Aortic Atherosclerosis (ICD10-I70.0). Electronically Signed   By: Keith Rake M.D.   On: 03/20/2020 23:11   CT ABDOMEN PELVIS W CONTRAST  Result Date: 03/29/2020 CLINICAL DATA:  Nausea and vomiting EXAM: CT ABDOMEN AND PELVIS WITH CONTRAST TECHNIQUE: Multidetector CT imaging of the abdomen and pelvis was performed using the standard protocol following bolus administration of intravenous contrast. CONTRAST:  172mL OMNIPAQUE IOHEXOL 300 MG/ML  SOLN COMPARISON:  CT 12/23/2016 FINDINGS: Lower chest: Lung bases demonstrate mild scarring at the left base. No acute consolidation or effusion. Surgical clips at the GE junction. Hepatobiliary: Gallbladder appears slightly dilated. No calcified gallstones. Interval development of moderate intra and extrahepatic biliary dilatation, common duct dilated up to 14 mm. Multiple rounded filling defects within the mid to distal common duct, series 5, image number 45, suspicious for stones.  Pancreas: Fatty atrophy without inflammatory change Spleen: Normal in size without focal abnormality. Adrenals/Urinary Tract: Adrenal glands are normal. Kidneys show no hydronephrosis. Slightly thick-walled appearance of urinary bladder without inflammatory change. Streaky hyperdense foci in the bladder with more focal hyperdensity along the left posterior bladder near the ureteral insertion, suspect that this is due to excreted contrast Stomach/Bowel: Status post partial gastrectomy with small bowel anastomosis within the anterior abdomen. No evidence for obstruction. No acute bowel wall thickening. Negative appendix. Vascular/Lymphatic: Moderate  aortic atherosclerosis. No aneurysm. No suspicious nodes. Reproductive: Prostate is unremarkable. Other: No free air or free fluid. Small fat containing umbilical hernia. Musculoskeletal: Scoliosis and postsurgical changes of the lumbar spine. Dysplastic appearing left acetabulum and femoral head and neck, chronic. IMPRESSION: 1. Interval development of moderate intra and extrahepatic biliary dilatation with multiple rounded filling defects within the mid to distal common duct, suspicious for choledocholithiasis. Suggest correlation with LFTs, further evaluation with MRCP may be obtained. 2. Streaky hyperdense foci within the bladder, suspect that this is due to excreted contrast as opposed to bladder mass. 3. Status post partial gastrectomy with no evidence for an obstruction. 4. Aortic atherosclerosis. Aortic Atherosclerosis (ICD10-I70.0). Electronically Signed   By: Donavan Foil M.D.   On: 03/20/2020 23:04   DG Chest Port 1 View  Result Date: 04/20/20 CLINICAL DATA:  70 year old male with ARDS, sepsis. EXAM: PORTABLE CHEST 1 VIEW COMPARISON:  Portable chest 03/30/2020 and earlier. FINDINGS: Portable AP upright view at 0637 hours. Stable endotracheal tube tip, visible enteric tube and right PICC line. Stable lung volumes and mediastinal contours. Regressed but not  resolved patchy and confluent bilateral mid and lower lung opacity. No pneumothorax. No definite pleural effusion. No areas of worsening ventilation. Stable pigtail right upper quadrant catheter, multiple surgical clips at the [place] J level. Paucity of bowel gas in the upper abdomen. IMPRESSION: 1. Improved bilateral ventilation with regressed but not resolved Patchy and confluent opacity. 2. Stable lines and tubes. Electronically Signed   By: Genevie Ann M.D.   On: 04/20/2020 09:06   DG Chest Port 1 View  Result Date: 03/30/2020 CLINICAL DATA:  ARDS. EXAM: PORTABLE CHEST 1 VIEW COMPARISON:  Multiple recent chest x-rays. The most recent is 03/29/2020 FINDINGS: The endotracheal tube, NG tube and right PICC line are stable. The cardiac silhouette, mediastinal and hilar contours are within normal limits and unchanged. Persistent bilateral pulmonary infiltrates. No pleural effusions. Stable right upper quadrant drainage catheter. IMPRESSION: 1. Stable support apparatus. 2. Persistent bilateral pulmonary infiltrates. Electronically Signed   By: Marijo Sanes M.D.   On: 03/30/2020 07:23   DG CHEST PORT 1 VIEW  Result Date: 03/29/2020 CLINICAL DATA:  ARDS. EXAM: PORTABLE CHEST 1 VIEW COMPARISON:  03/28/2020 FINDINGS: 0621 hours. Endotracheal tube tip is 3.9 cm above the base of the carina. The NG tube passes into the stomach although the distal tip position is not included on the film. Right PICC line tip overlies the proximal to mid SVC level given leftward patient rotation. Bibasilar airspace disease again noted without substantial pleural effusion. Nodular appearance in the right lung has decreased in the interval. Pigtail catheter overlying the right upper quadrant is again noted. IMPRESSION: 1. Interval decrease in nodular appearance in the right lung. 2. Persistent bibasilar airspace disease, not substantially changed in the interval. Electronically Signed   By: Misty Stanley M.D.   On: 03/29/2020 07:09   DG  Chest Port 1 View  Result Date: 03/28/2020 CLINICAL DATA:  Respiratory distress. EXAM: PORTABLE CHEST 1 VIEW COMPARISON:  03/27/2020 FINDINGS: 0523 hours. Endotracheal tube tip is 5.1 cm above the base of the carina. Right PICC line tip overlies the distal SVC level. NG tube tip is in the stomach. Pigtail catheter overlies the lateral right upper quadrant. Retrocardiac collapse/consolidation is similar to minimally improved in the interval. Patchy/nodular airspace disease in the right lung is more prominent than before. IMPRESSION: 1. Interval improvement in retrocardiac collapse/consolidation. 2. Interval increase in patchy/nodular airspace disease in the right lung.  Electronically Signed   By: Misty Stanley M.D.   On: 03/28/2020 07:03   DG Chest Port 1 View  Result Date: 03/27/2020 CLINICAL DATA:  ARDS, intubation EXAM: PORTABLE CHEST 1 VIEW COMPARISON:  Portable exam 0826 hours compared to 03/05/2020 FINDINGS: Tip of endotracheal tube projects 3.9 cm above carina. Nasogastric tube extends into stomach. RIGHT arm PICC line tip projects over cavoatrial junction. Pigtail RIGHT thoracostomy tube at lung base. Enlargement of cardiac silhouette. Mediastinal contours and pulmonary vascularity normal. Atherosclerotic calcification aorta. Atelectasis versus consolidation LEFT lower lobe. Abrupt cut off of LEFT lower lobe bronchus, could be due to mucous plugging or mass. Patchy infiltrate LEFT upper lobe and throughout RIGHT lung. No pleural effusion or pneumothorax. IMPRESSION: Line and tube positions as above. Enlargement of cardiac silhouette with mild RIGHT basilar atelectasis and persistent atelectasis versus consolidation of LEFT lower lobe. Abrupt cut off of LEFT lower lobe bronchus question mucous plugging or mass; recommend radiographic follow-up and if this fails to resolve CT imaging. Electronically Signed   By: Lavonia Dana M.D.   On: 03/27/2020 09:22   DG CHEST PORT 1 VIEW  Result Date:  03/28/2020 CLINICAL DATA:  PICC line placement, recent endoscopy and bile duct stent placement, previous abnormal chest x-ray EXAM: PORTABLE CHEST 1 VIEW COMPARISON:  03/25/2020 at 5:19 p.m. FINDINGS: Single frontal view of the chest demonstrates endotracheal tube overlying tracheal air column tip just below thoracic inlet. Enteric catheter tip and side port project over the gastric body. Right-sided PICC tip projects over the atriocaval junction. Cardiac silhouette is stable. There is progressive bilateral consolidation, most pronounced within the left lower lobe. This may reflect a combination of atelectasis and airspace disease. Small bilateral pleural effusions. No pneumothorax. No acute bony abnormalities. Pigtail drain overlies the right upper quadrant. IMPRESSION: 1. Progressive bilateral lung consolidation consistent with airspace disease and/or atelectasis. 2. Trace bilateral pleural effusions. 3. Support devices as above. Electronically Signed   By: Randa Ngo M.D.   On: 03/17/2020 21:20   DG Chest Port 1 View  Result Date: 03/24/2020 CLINICAL DATA:  NG tube placement. EXAM: PORTABLE CHEST 1 VIEW COMPARISON:  03/23/2020 FINDINGS: Surgical clips projecting over the gastroesophageal junction. Nasogastric tube terminates at the body of the stomach. Right upper quadrant percutaneous drain. Midline trachea. Moderate cardiomegaly. No pleural effusion or pneumothorax. No congestive failure. Improved right base airspace disease. Persistent left base airspace disease. Suspect inferior right upper lobe subtle airspace opacity. IMPRESSION: Nasogastric tube terminating at the body of the stomach. Cardiomegaly without congestive failure. Improved right and similar left base atelectasis. Possible developing right upper lobe airspace disease. Consider radiographic follow-up to exclude pneumonia or aspiration. Electronically Signed   By: Abigail Miyamoto M.D.   On: 03/30/2020 17:39   DG CHEST PORT 1 VIEW  Result  Date: 03/23/2020 CLINICAL DATA:  Short of breath EXAM: PORTABLE CHEST 1 VIEW COMPARISON:  08/23/2019 FINDINGS: Single frontal view of the chest demonstrates an unremarkable cardiac silhouette. Her bibasilar areas of consolidation likely reflecting atelectasis. The right pleural effusion seen on recent CT is not well visualized. No pneumothorax. No acute bony abnormalities. IMPRESSION: 1. Bibasilar consolidation consistent with atelectasis. The right pleural effusion seen on recent CT is not well visualized. Electronically Signed   By: Randa Ngo M.D.   On: 03/23/2020 15:35   DG ERCP BILIARY & PANCREATIC DUCTS  Result Date: 03/23/2020 CLINICAL DATA:  Common bile duct obstruction EXAM: ERCP TECHNIQUE: Multiple spot images obtained with the fluoroscopic device and submitted  for interpretation post-procedure. FLUOROSCOPY TIME:  Fluoroscopy Time:  13 minutes and 28 seconds Radiation Exposure Index (if provided by the fluoroscopic device): 189.2 mGy Number of Acquired Spot Images: 20 COMPARISON:  CT dated 03/20/2020 FINDINGS: Initial images demonstrate a plastic biliary stent in place. Subsequent images demonstrate removal of the plastic biliary stent and cannulation of the common bile duct with injection of contrast. There are multiple filling defects within the common bile duct. This appears to have been followed by a balloon sweep and possible sphincterotomy. Final images demonstrate placement of a pancreatic stent and common bile duct stent. There was no definite extraluminal contrast visualized during the study. IMPRESSION: ERCP as above. These images were submitted for radiologic interpretation only. Please see the procedural report for the amount of contrast and the fluoroscopy time utilized. Electronically Signed   By: Constance Holster M.D.   On: 03/25/2020 14:43   DG ERCP BILIARY & PANCREATIC DUCTS  Result Date: 03/12/2020 CLINICAL DATA:  70 year old male undergoing ERCP for choledocholithiasis  EXAM: ERCP TECHNIQUE: Multiple spot images obtained with the fluoroscopic device and submitted for interpretation post-procedure. FLUOROSCOPY TIME:  Fluoroscopy Time:  3 minutes 36 seconds Radiation Exposure Index (if provided by the fluoroscopic device): 46.24 mGy COMPARISON:  CT abdomen/pelvis 03/15/2020 FINDINGS: A total of 4 intraoperative saved images are submitted for review. The images demonstrate a flexible duodenal scope in the descending duodenum with wire cannulation of the common bile duct. Cholangiogram demonstrates intra and extrahepatic biliary ductal dilatation. Multiple filling defects in the distal common bile duct consistent with choledocholithiasis. Subsequent images demonstrate sphincterotomy and balloon sweeping of the duct. IMPRESSION: 1. Choledocholithiasis. 2. ERCP with sphincterotomy and presumed sweeping of the common duct. These images were submitted for radiologic interpretation only. Please see the procedural report for the amount of contrast and the fluoroscopy time utilized. Electronically Signed   By: Jacqulynn Cadet M.D.   On: 03/24/2020 15:47   ECHOCARDIOGRAM COMPLETE  Result Date: 03/23/2020    ECHOCARDIOGRAM REPORT   Patient Name:   Jorge Evans. Date of Exam: 03/23/2020 Medical Rec #:  [id number]           Height:       66.0 in Accession #:    [id number]          Weight:       154.0 lb Date of Birth:  07-29-1950           BSA:          1.790 m Patient Age:    36 years            BP:           121/65 mmHg Patient Gender: M                   HR:           88 bpm. Exam Location:  Inpatient Procedure: 2D Echo and Intracardiac Opacification Agent Indications:    atrial fibrillation  History:        Patient has no prior history of Echocardiogram examinations.                 Arrythmias:Atrial Fibrillation.  Sonographer:    Johny Chess Referring Phys: [place]  1. Left ventricular ejection fraction, by estimation, is 65 to 70%. The left  ventricle has normal function. The left ventricle has no regional wall motion abnormalities. Left ventricular diastolic function could not be evaluated.  2.  Right ventricular systolic function is normal. The right ventricular size is normal. There is normal pulmonary artery systolic pressure. The estimated right ventricular systolic pressure is 23.4 mmHg.  3. The mitral valve is grossly normal. Trivial mitral valve regurgitation.  4. The aortic valve is calcified. Aortic valve regurgitation is trivial. No aortic stenosis is present.  5. The inferior vena cava is normal in size with greater than 50% respiratory variability, suggesting right atrial pressure of 3 mmHg. Comparison(s): No prior Echocardiogram. FINDINGS  Left Ventricle: Left ventricular ejection fraction, by estimation, is 65 to 70%. The left ventricle has normal function. The left ventricle has no regional wall motion abnormalities. Definity contrast agent was given IV to delineate the left ventricular  endocardial borders. The left ventricular internal cavity size was normal in size. There is no left ventricular hypertrophy. Left ventricular diastolic function could not be evaluated due to atrial fibrillation. Left ventricular diastolic function could  not be evaluated. Right Ventricle: The right ventricular size is normal. No increase in right ventricular wall thickness. Right ventricular systolic function is normal. There is normal pulmonary artery systolic pressure. The tricuspid regurgitant velocity is 2.26 m/s, and  with an assumed right atrial pressure of 3 mmHg, the estimated right ventricular systolic pressure is 23.4 mmHg. Left Atrium: Left atrial size was normal in size. Right Atrium: Right atrial size was normal in size. Pericardium: There is no evidence of pericardial effusion. Mitral Valve: The mitral valve is grossly normal. Trivial mitral valve regurgitation. Tricuspid Valve: The tricuspid valve is grossly normal. Tricuspid valve  regurgitation is trivial. Aortic Valve: The aortic valve is calcified. Aortic valve regurgitation is trivial. No aortic stenosis is present. Pulmonic Valve: The pulmonic valve was normal in structure. Pulmonic valve regurgitation is not visualized. Aorta: The aortic root and ascending aorta are structurally normal, with no evidence of dilitation. Venous: The inferior vena cava is normal in size with greater than 50% respiratory variability, suggesting right atrial pressure of 3 mmHg. IAS/Shunts: No atrial level shunt detected by color flow Doppler.  LEFT VENTRICLE PLAX 2D LVIDd:         4.20 cm LVIDs:         2.50 cm LV PW:         1.00 cm LV IVS:        1.10 cm LVOT diam:     2.00 cm LVOT Area:     3.14 cm  LEFT ATRIUM             Index       RIGHT ATRIUM           Index LA diam:        3.90 cm 2.18 cm/m  RA Area:     11.30 cm LA Vol (A2C):   29.1 ml 16.26 ml/m RA Volume:   20.20 ml  11.29 ml/m LA Vol (A4C):   40.4 ml 22.58 ml/m LA Biplane Vol: 37.7 ml 21.07 ml/m   AORTA Ao Root diam: 3.40 cm Ao Asc diam:  3.60 cm TRICUSPID VALVE TR Peak grad:   20.4 mmHg TR Vmax:        226.00 cm/s  SHUNTS Systemic Diam: 2.00 cm Zoila Shutter MD Electronically signed by Zoila Shutter MD Signature Date/Time: 03/23/2020/5:05:35 PM    Final    CT IMAGE GUIDED FLUID DRAIN BY CATHETER  Result Date: 03/21/2020 CLINICAL DATA:  Status post cholecystectomy on 03/25/2020 with development of significant perihepatic fluid suspicious for bile leak. EXAM: CT GUIDED CATHETER  DRAINAGE OF PERIHEPATIC ABSCESS ANESTHESIA/SEDATION: 1.5 mg IV Versed 75 mcg IV Fentanyl Total Moderate Sedation Time:  13 minutes The patient's level of consciousness and physiologic status were continuously monitored during the procedure by Radiology nursing. PROCEDURE: The procedure, risks, benefits, and alternatives were explained to the patient. Questions regarding the procedure were encouraged and answered. The patient understands and consents to the  procedure. A time out was performed prior to initiating the procedure. CT was performed through the upper abdomen in a supine position. The right abdominal wall was prepped with chlorhexidine in a sterile fashion, and a sterile drape was applied covering the operative field. A sterile gown and sterile gloves were used for the procedure. Local anesthesia was provided with 1% Lidocaine. Under CT guidance, a 18 gauge trocar needle was advanced into the lateral perihepatic space. After return of fluid, a guidewire was advanced, tract dilatation performed over the wire and a 12 French percutaneous drainage catheter advanced over the wire. Drainage catheter position was confirmed by CT. A fluid sample was withdrawn and sent for culture analysis. The drain was connected to a suction bulb. The bulb was emptied multiple times during the procedure. The drain was secured at the skin with a Prolene retention suture and StatLock device. COMPLICATIONS: None FINDINGS: Contiguous fluid is present beginning in the subdiaphragmatic space and continuing in the lateral perihepatic space. There is a relatively small amount of fluid in the gallbladder fossa. There is a small amount of gas in the perihepatic fluid. After needle puncture and drain placement, there is return of dark, bilious fluid. After drain placement, there is rapid drainage of bilious fluid and several hundred mL of fluid was removed immediately after drain placement. IMPRESSION: CT-guided percutaneous catheter drainage of perihepatic fluid collection yielding dark, bilious fluid. A fluid sample was sent for culture analysis. A 12 French drain was placed in the lateral perihepatic space and attached to suction bulb drainage with rapid removal of bilious fluid. Electronically Signed   By: Aletta Edouard M.D.   On: 03/21/2020 11:07   [place] EKG SITE RITE  Result Date: 03/10/2020 If Site Rite image not attached, placement could not be confirmed due to current cardiac  rhythm.   Microbiology Recent Results (from the past 240 hour(s))  Culture, blood (routine x 2)     Status: None   Collection Time: 03/27/20 12:00 AM   Specimen: BLOOD LEFT HAND  Result Value Ref Range Status   Specimen Description BLOOD LEFT HAND  Final   Special Requests   Final    BOTTLES DRAWN AEROBIC AND ANAEROBIC Blood Culture results may not be optimal due to an inadequate volume of blood received in culture bottles   Culture   Final    NO GROWTH 5 DAYS Performed at [facility], [place] [address], [place], [place] [address]    Report Status 04/01/2020 FINAL  Final  Culture, blood (routine x 2)     Status: None   Collection Time: 03/27/20 12:25 AM   Specimen: BLOOD LEFT HAND  Result Value Ref Range Status   Specimen Description BLOOD LEFT HAND  Final   Special Requests   Final    BOTTLES DRAWN AEROBIC AND ANAEROBIC Blood Culture results may not be optimal due to an inadequate volume of blood received in culture bottles   Culture   Final    NO GROWTH 5 DAYS Performed at [facility], [place] [address], [place], [place] [address]    Report Status  04/01/2020 FINAL  Final    Lab Basic Metabolic Panel: Recent Labs  Lab 03/29/20 0445 03/29/20 1123 03/30/20 0452 03/31/20 0500 04/01/20 1252 04/02/20 0546 04/29/2020 0412  NA 138   < > 141 142 147* 148* 155*  K 4.0   < > 4.1 4.0 3.8 3.5 3.2*  CL 104  --  104 104 100 98 99  CO2 26  --  28 28 38* 37* 42*  GLUCOSE 178*  --  199* 161* 145* 175* 111*  BUN 35*  --  37* 41* 45* 51* 69*  CREATININE 1.13  --  1.02 1.02 1.06 1.09 0.95  CALCIUM 6.3*  --  6.9* 6.5* 7.3* 7.3* 7.6*  MG 2.2  --  2.2  --   --  2.1 2.1  PHOS 2.6  --  2.1* 3.0  --  4.1 5.0*   < > = values in this interval not displayed.   Liver Function Tests: Recent Labs  Lab 03/29/20 0445 03/30/20 0452 04/02/20 0546  AST 182* 93* 50*  ALT 187* 128* 34  ALKPHOS 76 68 63  BILITOT 1.8* 2.2* 2.3*  PROT 3.9* 4.1* 4.0*  ALBUMIN 1.1* 1.0* <1.0*   No  results for input(s): LIPASE, AMYLASE in the last 168 hours. No results for input(s): AMMONIA in the last 168 hours. CBC: Recent Labs  Lab 03/30/20 0452 03/31/20 0500 04/01/20 0500 04/02/20 0546 04/03/20 0525 04/29/2020 0412  WBC 9.1 11.7* 10.1 8.3 12.0* 10.3  NEUTROABS 7.9*  --   --   --   --   --   HGB 8.7* 8.7* 8.2* 7.7* 9.0* 8.3*  HCT 27.0* 28.9* 25.8* 24.8* 27.6* 27.1*  MCV 90.6 92.9 91.2 91.2 89.3 89.4  PLT 117* 132* 91* 73* 84* 92*   Cardiac Enzymes: No results for input(s): CKTOTAL, CKMB, CKMBINDEX, TROPONINI in the last 168 hours. Sepsis Labs: Recent Labs  Lab 04/01/20 0500 04/02/20 0546 04/03/20 0525 04/29/20 0412  WBC 10.1 8.3 12.0* 10.3    Procedures/Operations  -Endotracheal tube 12/23 -ercpwith cbd stent 12/10  -Cholecystectomy 12/16  -IR drain for bile leak 12/18  -ercp and stent placement 12/21 -diagnostic lap 12/23 and intra-abdominal drain  Roselie Awkward 2020-04-29, 5:12 PM

## 2020-05-05 NOTE — Progress Notes (Addendum)
K+ 3.2 Replaced per protocol   Pt to be made comfort today, so orders dc/d

## 2020-05-05 NOTE — Progress Notes (Signed)
NAME:  Jorge Banghart., MRN:  [id number], DOB:  04-11-1950, LOS: 22 ADMISSION DATE:  03/07/2020, CONSULTATION DATE:  12/23 REFERRING MD:  Garald Braver, CHIEF COMPLAINT:  Abdominal pain   Brief History:  70 year old male presented originally 12/9with complaints of abdominal pain, nausea, and vomiting, imaging concerning forcholedocholithiasisunderwent ERCP with stent placement later followed with cholecystectomy.Overnight 12/23 patient developed worsening abdominal pain patient was taken for diagnostic laparoscopy with placement of intra-abdominal drain.He remained intubated postoperatively due to respiratory compromise including questionable aspiration  Past Medical History:  Anemia GERD  Significant Hospital Events:  -ercp with cbd stent 12/10  -Cholecystectomy 12/16  -IR drain for bile leak 12/18  -ercp and stent placement 12/21 -diagnostic lap 12/23 and intra-abdominal drain, A. fib with RVR requiring diltiazem, septic shock requiring phenylephrine 12/24: More hypoxic requiring, chest x-ray showing bilateral airspace disease worrisome for evolving ARDS, placed on ARDS protocol, added fentanyl infusion.  Metabolic acidosis,, with ongoing septic shock.  Off calcium channel blocker.  Titrating phenylephrine.  Additional volume resuscitation.  Had bronchoscopy.,  Had significant clear bilious aspirate which was lavaged.  Placed on bicarbonate drip for ongoing acidosis 12/25: Acid-base improved.  CVP now 23, off pressors.  One-time Lasix.  Trying to minimize fluid administration.  Still on high PEEP/FiO2 spiking fever in excess of 103, change antibiotic coverage to meropenem and vancomycin.12/26: Hemodynamically stable overnight, no fever spike, white blood cell count significantly improved.  Consults:  GI Surgery IR Ccm [facility] cardiology  Procedures:  Endotracheal tube 12/23  Significant Diagnostic Tests:  12/9 ct abd/pelvis: 1. Interval development of moderate intra and  extrahepatic biliary dilatation with multiple rounded filling defects within the mid to distal common duct, suspicious for choledocholithiasis. Suggest correlation with LFTs, further evaluation with MRCP may be obtained. 2. Streaky hyperdense foci within the bladder, suspect that this is due to excreted contrast as opposed to bladder mass. 3. Status post partial gastrectomy with no evidence for an obstruction. 4. Aortic atherosclerosis.  12/17 ct abd/pelvis: 1. Interval cholecystectomy. Moderate volume of perihepatic free fluid containing foci of air with non organized fluid and air in the gallbladder fossa. Given the amount of fluid, biloma is favored over postoperative seroma. Sterility is indeterminate by imaging. Consider nuclear medicine hepatic biliary scan to assess for bile leak. 2. Plastic common bile duct stent in place. Suspected residual 9 mm stone at the proximal aspect of the stent. Common bile duct dilatation of 16 mm, with improved but persistent intrahepatic biliary dilatation on the left. Pneumobilia related to recent procedure. 3. Small right and trace left pleural effusions. 4. Absent renal excretion on delayed phase imaging, can be seen with renal dysfunction. 5. Mild distal colonic diverticulosis without diverticulitis. 12/20 echo: LVEF 65-70% RV normal 12/22 ct abdomen pelvis: 1. Moderate amount of intra-abdominal free air, increased in severity when compared to the prior study. While this is likely secondary to the patient's recent surgical intervention, the presence of a perforated hollow viscus cannot completely be excluded. 2. Additional findings consistent with a postoperative ileus. 3. Right-sided surgical drain positioning, as described above, with near-total resolution of the perihepatic fluid seen on the prior study. 4. Interval distal pancreatic duct stent placement since the prior exam. 5. Moderate severity anasarca. 6. Small hiatal hernia. 7.  Aortic atherosclerosis  Micro Data:  12/10 sars2: neg 12/18 perihepatic biloma: Multiple organisms present, none predominant, no anaerobes isolated.  Antimicrobials:  Zosyn 12/17-> 12/25 Vancomycin 12/ 23>> Meropenem 12/25>>>   Interim History / Subjective:   Family  present Awaiting arrival of a daughter and niece No acute events  Objective   Blood pressure (!) 169/63, pulse 91, temperature 99.32 F (37.4 C), resp. rate (!) 30, height 5' 5.75" (1.67 m), weight 69.9 kg, SpO2 100 %.    Vent Mode: PRVC FiO2 (%):  [50 %-70 %] 50 % Set Rate:  [32 bmp] 32 bmp Vt Set:  [380 mL] 380 mL PEEP:  [12 cmH20] 12 cmH20 Plateau Pressure:  [21 cmH20-27 cmH20] 23 cmH20   Intake/Output Summary (Last 24 hours) at 04/05/2020 0836 Last data filed at 04/16/2020 0500 Gross per 24 hour  Intake 2116.85 ml  Output 3540 ml  Net -1423.15 ml   Filed Weights   04/06/2020 1442 04/03/2020 1441  Weight: 69.9 kg 69.9 kg    Examination: General:  In bed on vent HENT: NCAT ETT in place PULM: Crackles bases B, vent supported breathing CV: RRR, no mgr GI: BS+, soft, nontender MSK: normal bulk and tone Derm: significant edema bilaterally Neuro: sedated on vent   Resolved Hospital Problem list     Assessment & Plan:  Acute choledocholithiasis, s/p cholecystectomy complicated by bile leak and abdominal peritonitis 12/18 requiring stenting on 12/21 and subsequent return to OR 12/23 with washout, lysis of adhesion, new drain placement Severe sepsis secondary to abdominal peritonitis Severe anasarca due to volume resuscitation early in hospital course Acute hypoxemic respiratory failure secondary to aspiration pneumonia and ARDS Atrial fibrillation with RVR Anemia due to critical illness Thrombocytopenia Hyperglycemia Severe protein calorie malnutrition  Discussion: He has not made significant progression and will not survive this illness.  The family wishes withdrawal of care.  Plan: Continue full  vent support for now Continue sedation on current level Code status DNR Comfort measures Plan withdrawal of support later today Change propofol to versed after family arrival Mouth care   Best practice (evaluated daily)  Diet: hold Pain/Anxiety/Delirium protocol (if indicated): change to comfort  VAP protocol (if indicated): n/a DVT prophylaxis: n/a GI prophylaxis: n/a Glucose control: n/a Mobility: n/a Disposition: comfort measures  Goals of Care:  Last date of multidisciplinary goals of care discussion:1/1 Family and staff present: wife, sisters, Marchelle Folks, bedside nurse Summary of discussion: we discussed his situation at length today and his failure to improve.  They voiced understanding and desire to withdraw care Follow up goals of care discussion due: n/a Code Status: DNR  Labs   CBC: Recent Labs  Lab 03/30/20 0452 03/31/20 0500 04/01/20 0500 04/02/20 0546 04/03/20 0525 04/25/2020 0412  WBC 9.1 11.7* 10.1 8.3 12.0* 10.3  NEUTROABS 7.9*  --   --   --   --   --   HGB 8.7* 8.7* 8.2* 7.7* 9.0* 8.3*  HCT 27.0* 28.9* 25.8* 24.8* 27.6* 27.1*  MCV 90.6 92.9 91.2 91.2 89.3 89.4  PLT 117* 132* 91* 73* 84* 92*    Basic Metabolic Panel: Recent Labs  Lab 03/29/20 0445 03/29/20 1123 03/30/20 0452 03/31/20 0500 04/01/20 1252 04/02/20 0546 05/04/2020 0412  NA 138   < > 141 142 147* 148* 155*  K 4.0   < > 4.1 4.0 3.8 3.5 3.2*  CL 104  --  104 104 100 98 99  CO2 26  --  28 28 38* 37* 42*  GLUCOSE 178*  --  199* 161* 145* 175* 111*  BUN 35*  --  37* 41* 45* 51* 69*  CREATININE 1.13  --  1.02 1.02 1.06 1.09 0.95  CALCIUM 6.3*  --  6.9* 6.5* 7.3* 7.3* 7.6*  MG 2.2  --  2.2  --   --  2.1 2.1  PHOS 2.6  --  2.1* 3.0  --  4.1 5.0*   < > = values in this interval not displayed.   GFR: Estimated Creatinine Clearance: 64.7 mL/min (by C-G formula based on SCr of 0.95 mg/dL). Recent Labs  Lab 04/01/20 0500 04/02/20 0546 04/03/20 0525 04/05/2020 0412  WBC 10.1 8.3 12.0* 10.3     Liver Function Tests: Recent Labs  Lab 03/29/20 0445 03/30/20 0452 04/02/20 0546  AST 182* 93* 50*  ALT 187* 128* 34  ALKPHOS 76 68 63  BILITOT 1.8* 2.2* 2.3*  PROT 3.9* 4.1* 4.0*  ALBUMIN 1.1* 1.0* <1.0*   No results for input(s): LIPASE, AMYLASE in the last 168 hours. No results for input(s): AMMONIA in the last 168 hours.  ABG    Component Value Date/Time   PHART 7.424 03/29/2020 1123   PCO2ART 46.0 03/29/2020 1123   PO2ART 82 (L) 03/29/2020 1123   HCO3 29.8 (H) 03/29/2020 1123   TCO2 31 03/29/2020 1123   ACIDBASEDEF 4.0 (H) 03/27/2020 2005   O2SAT 96.0 03/29/2020 1123     Coagulation Profile: No results for input(s): INR, PROTIME in the last 168 hours.  Cardiac Enzymes: No results for input(s): CKTOTAL, CKMB, CKMBINDEX, TROPONINI in the last 168 hours.  HbA1C: Hgb A1c MFr Bld  Date/Time Value Ref Range Status  08/23/2019 08:28 AM 4.9 <5.7 % of total Hgb Final    Comment:    For the purpose of screening for the presence of diabetes: . <5.7%       Consistent with the absence of diabetes 5.7-6.4%    Consistent with increased risk for diabetes             (prediabetes) > or =6.5%  Consistent with diabetes . This assay result is consistent with a decreased risk of diabetes. . Currently, no consensus exists regarding use of hemoglobin A1c for diagnosis of diabetes in children. . According to American Diabetes Association (ADA) guidelines, hemoglobin A1c <7.0% represents optimal control in non-pregnant diabetic patients. Different metrics may apply to specific patient populations.  Standards of Medical Care in Diabetes(ADA). Marland Kitchen   12/08/2015 09:05 AM 5.3 <5.7 % Final    Comment:      For the purpose of screening for the presence of diabetes:   <5.7%       Consistent with the absence of diabetes 5.7-6.4 %   Consistent with increased risk for diabetes (prediabetes) >=6.5 %     Consistent with diabetes   This assay result is consistent with a  decreased risk of diabetes.   Currently, no consensus exists regarding use of hemoglobin A1c for diagnosis of diabetes in children.   According to American Diabetes Association (ADA) guidelines, hemoglobin A1c <7.0% represents optimal control in non-pregnant diabetic patients. Different metrics may apply to specific patient populations. Standards of Medical Care in Diabetes (ADA).       CBG: Recent Labs  Lab 04/03/20 0608 04/03/20 1144 04/03/20 1611 04/03/20 2329 04/17/2020 0604  GLUCAP 153* 140* 149* 99 102*       Critical care time: 30 minutes     Roselie Awkward, MD [place] [facility] Pager: [phone] Cell: [phone] If no response, call [phone]

## 2020-05-05 NOTE — Progress Notes (Signed)
Patient time of death 43, family at bedside. Absent lung and heart sounds auscultated by Bartholomew Crews RN and Everette Rank RN. Wife [place] voiced receipt of all patient belongings during a previous visit. Support given to family members at bedside and privacy provided to say their goodbyes, all questions answered.

## 2020-05-05 NOTE — Procedures (Signed)
Extubation Procedure Note  Patient Details:   Name: Jorge Evans. DOB: 09-06-1950 MRN: [id number]   Airway Documentation:    Vent end date: 04/21/2020 Vent end time: 1426   Evaluation  Patient extubated per comfort care measures. RN and family at bedside.  Harmon Dun Depriest 04/26/2020, 2:27 PM

## 2020-05-05 DEATH — deceased

## 2020-05-25 ENCOUNTER — Ambulatory Visit: Payer: Medicare Other | Admitting: Family Medicine

## 2021-11-16 IMAGING — CT CT ABD-PELV W/O CM
2 of 4 series · 15 of 46 positions shown, 17 images · non-contrast
Comparison: None.

CLINICAL DATA: Postoperative abdominal pain.

EXAM:
CT ABDOMEN AND PELVIS WITHOUT CONTRAST
TECHNIQUE: Multidetector CT imaging of the abdomen and pelvis was performed
following the standard protocol without IV contrast.

[Series 3: ap without · axial · non-contrast · 0.83mm/px · z∈[+976,+1401]mm · 12 of 95 slices shown, 14 images]
[im 5/95  soft-tissue]
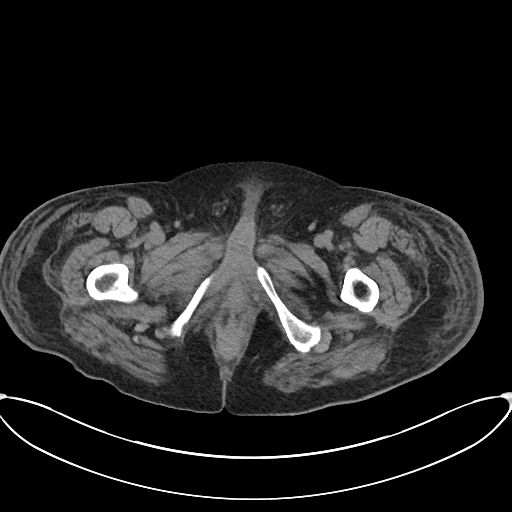
[im 5/95  bone]
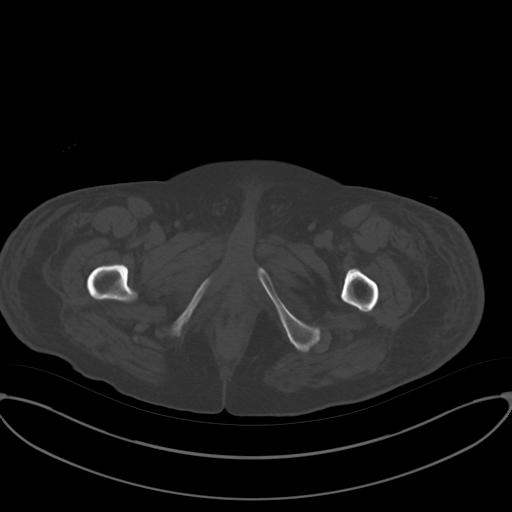
[im 15/95  soft-tissue]
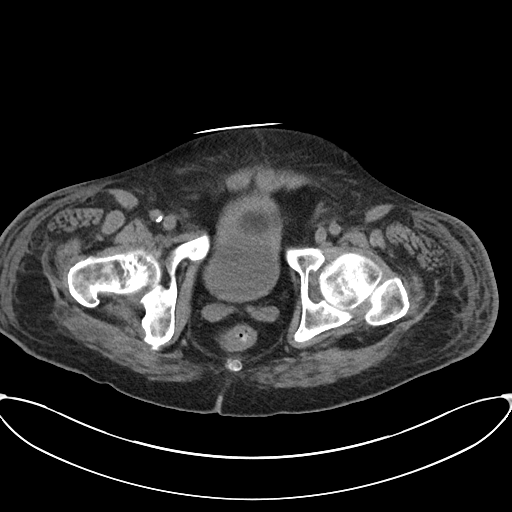
[im 19/95  soft-tissue]
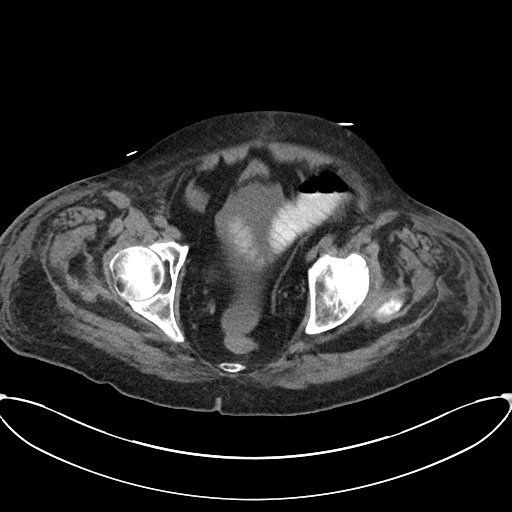
[im 29/95  soft-tissue]
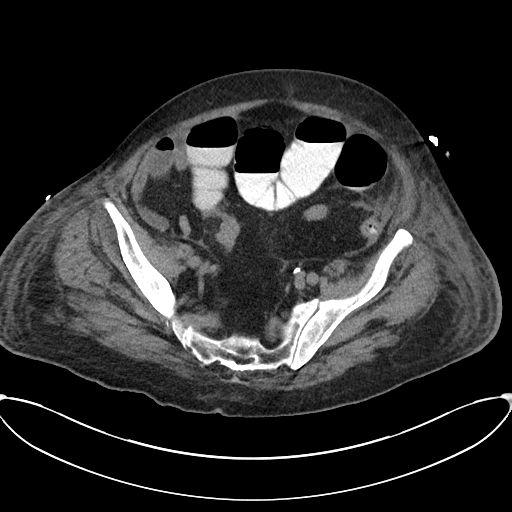
[im 38/95  soft-tissue]
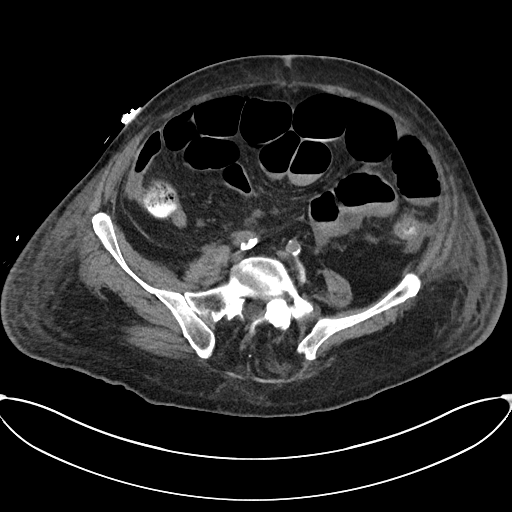
[im 43/95  soft-tissue]
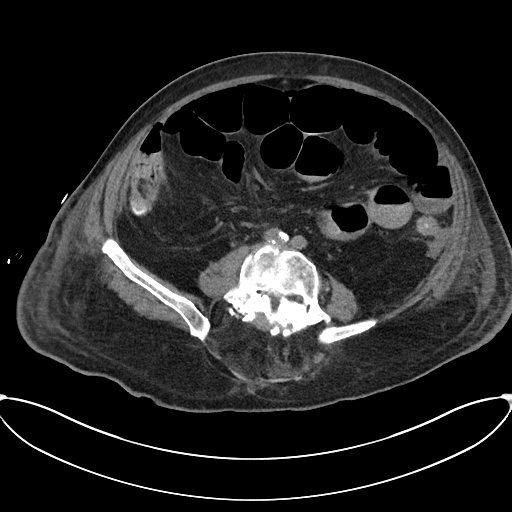
[im 52/95  soft-tissue]
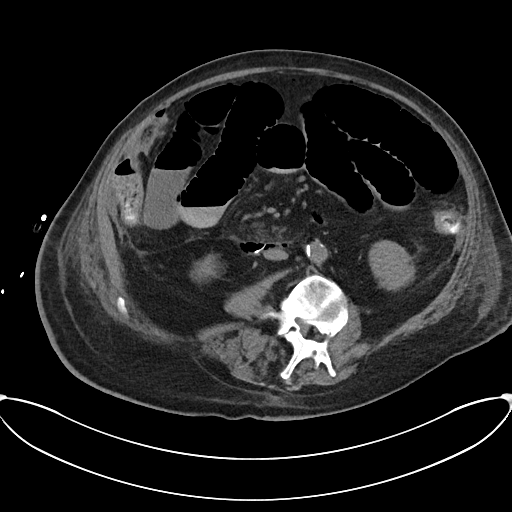
[im 57/95  soft-tissue]
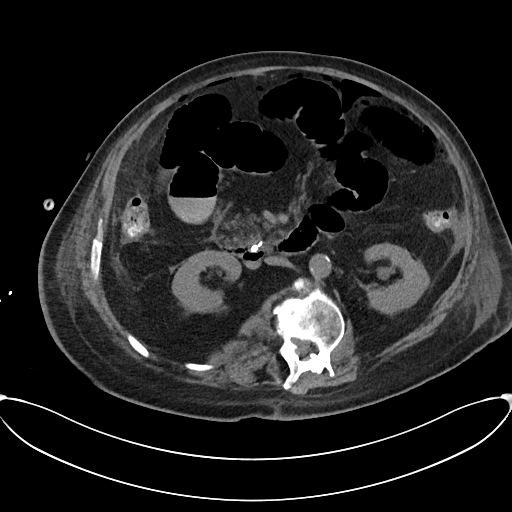
[im 66/95  soft-tissue]
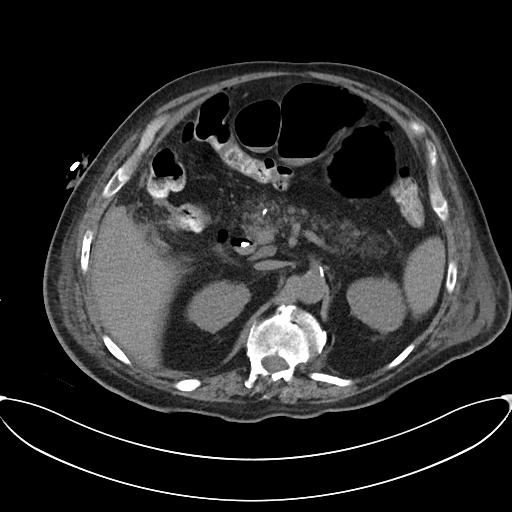
[im 66/95  bone]
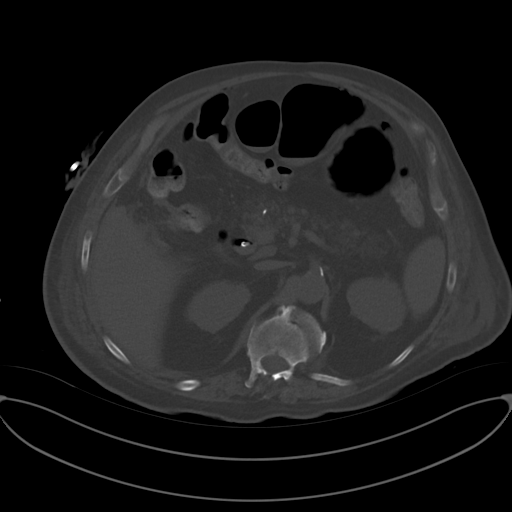
[im 76/95  soft-tissue]
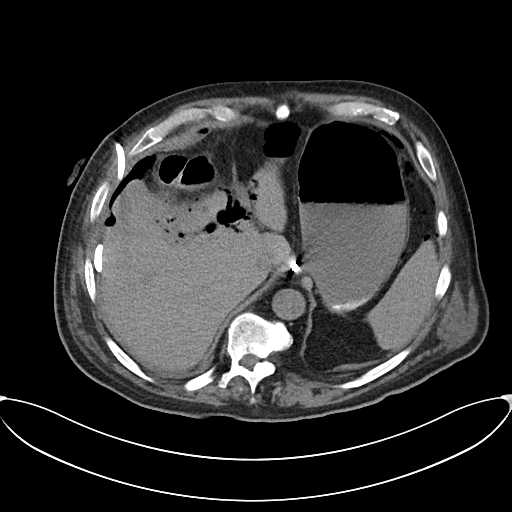
[im 80/95  soft-tissue]
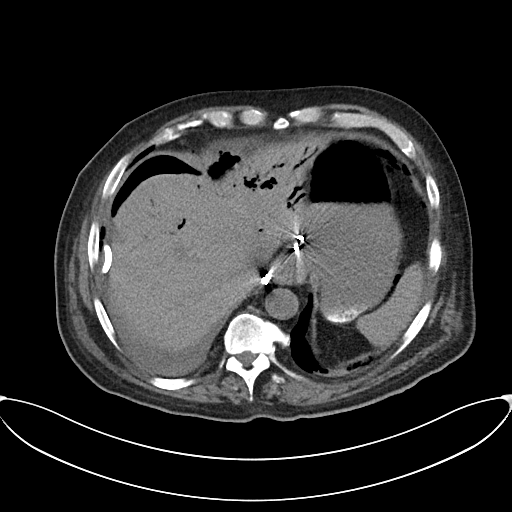
[im 90/95  soft-tissue]
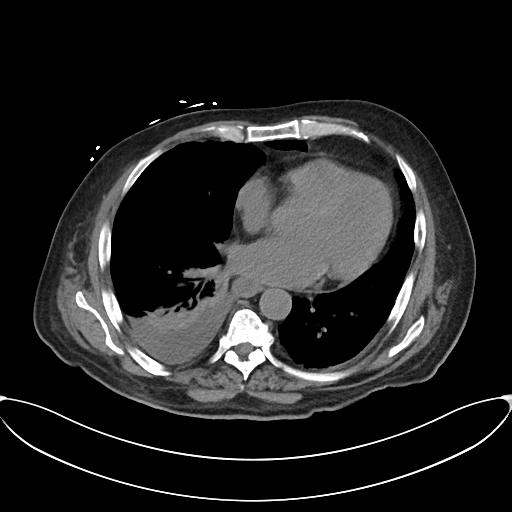

[Series 5: cor · coronal · 0.86mm/px · 3 of 106 slices shown]
[im 36/106  soft-tissue]
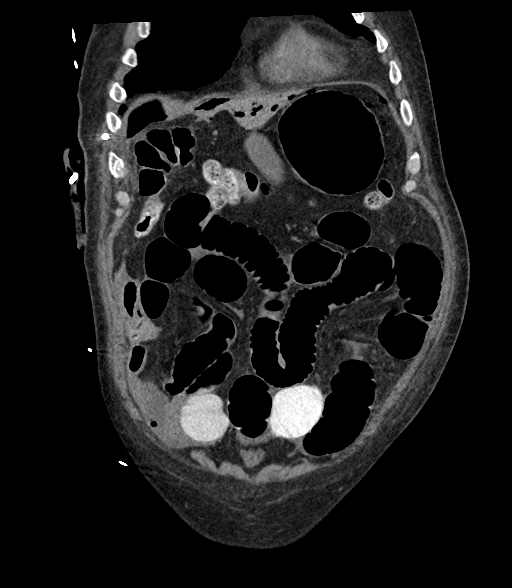
[im 47/106  soft-tissue]
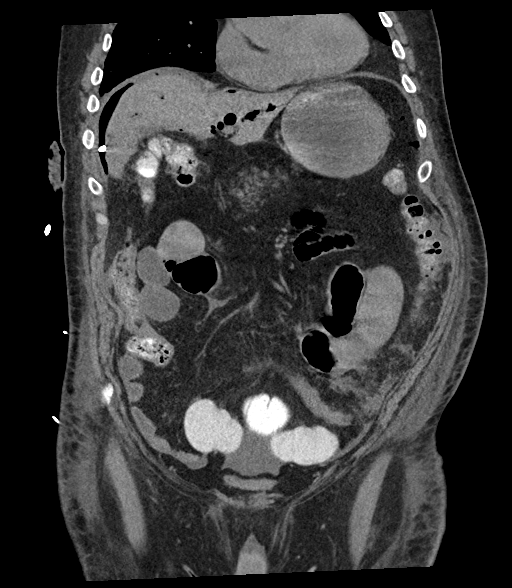
[im 59/106  soft-tissue]
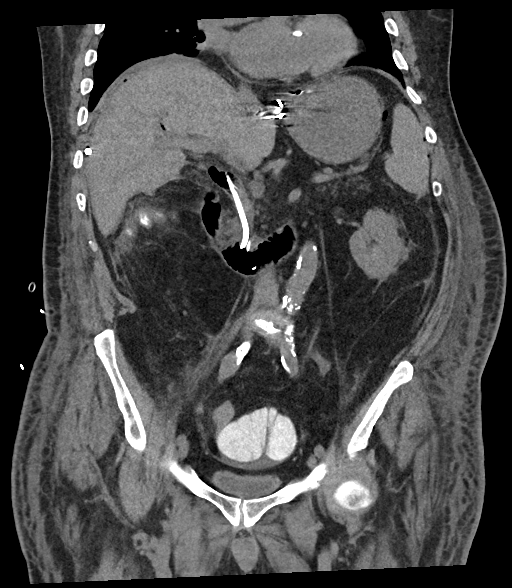

[15 of 46 positions shown; findings below may reference images not displayed]

FINDINGS: Lower chest: Mild areas of atelectasis and/or infiltrate are seen
within the bilateral lung bases, right greater than left.

There is a small right pleural effusion.

Hepatobiliary: No focal liver abnormality is seen. A moderate amount
of pneumobilia is noted. Status post cholecystectomy. A distal
common bile duct stent is in place.

Pancreas: A distal pancreatic duct stent is seen. This represents a
new finding when compared to the prior study. The remaining
visualized portions of the pancreas are unremarkable

Spleen: Normal in size without focal abnormality.

Adrenals/Urinary Tract: Adrenal glands are unremarkable. Kidneys are
normal, without renal calculi, focal lesion, or hydronephrosis.
Bladder is unremarkable.

Stomach/Bowel: There is a small hiatal hernia. Multiple surgical
clips are seen along the gastroesophageal junction. Appendix appears
normal. Multiple dilated small bowel loops are seen within the
abdomen and pelvis. A gradual transition zone is seen within the mid
to lower right abdomen.

A moderate amount of intra-abdominal the free air is seen within the
anterior aspect of the abdomen. This is increased in severity when
compared to the prior study.

Vascular/Lymphatic: Aortic atherosclerosis. No enlarged abdominal or
pelvic lymph nodes.

Reproductive: The prostate gland is mildly enlarged.

Other: Moderate severity anasarca is seen along the lateral aspects
of the abdominal and pelvic walls.

A surgical drain is seen the entering via the anterolateral aspect
of the abdomen on the right. Its distal end is seen adjacent to the
lateral aspect of the right lobe of the liver. A trace amount of
residual perihepatic fluid is seen within this region.

A small amount of posterior pelvic free fluid is seen.

Musculoskeletal: Degenerative changes seen throughout the lumbar
spine.
IMPRESSION: 1. Moderate amount of intra-abdominal free air, increased in
severity when compared to the prior study. While this is likely
secondary to the patient's recent surgical intervention, the
presence of a perforated hollow viscus cannot completely be
excluded.
2. Additional findings consistent with a postoperative ileus.
3. Right-sided surgical drain positioning, as described above, with
near-total resolution of the perihepatic fluid seen on the prior
study.
4. Interval distal pancreatic duct stent placement since the prior
exam.
5. Moderate severity anasarca.
6. Small hiatal hernia.
7. Aortic atherosclerosis.

Aortic Atherosclerosis ([id number]).

## 2021-11-19 IMAGING — DX DG CHEST 1V PORT
1 series · 1 of 1 positions shown · non-contrast
Comparison: 03/27/2020

CLINICAL DATA: Respiratory distress.

EXAM:
PORTABLE CHEST 1 VIEW

[chest ap]
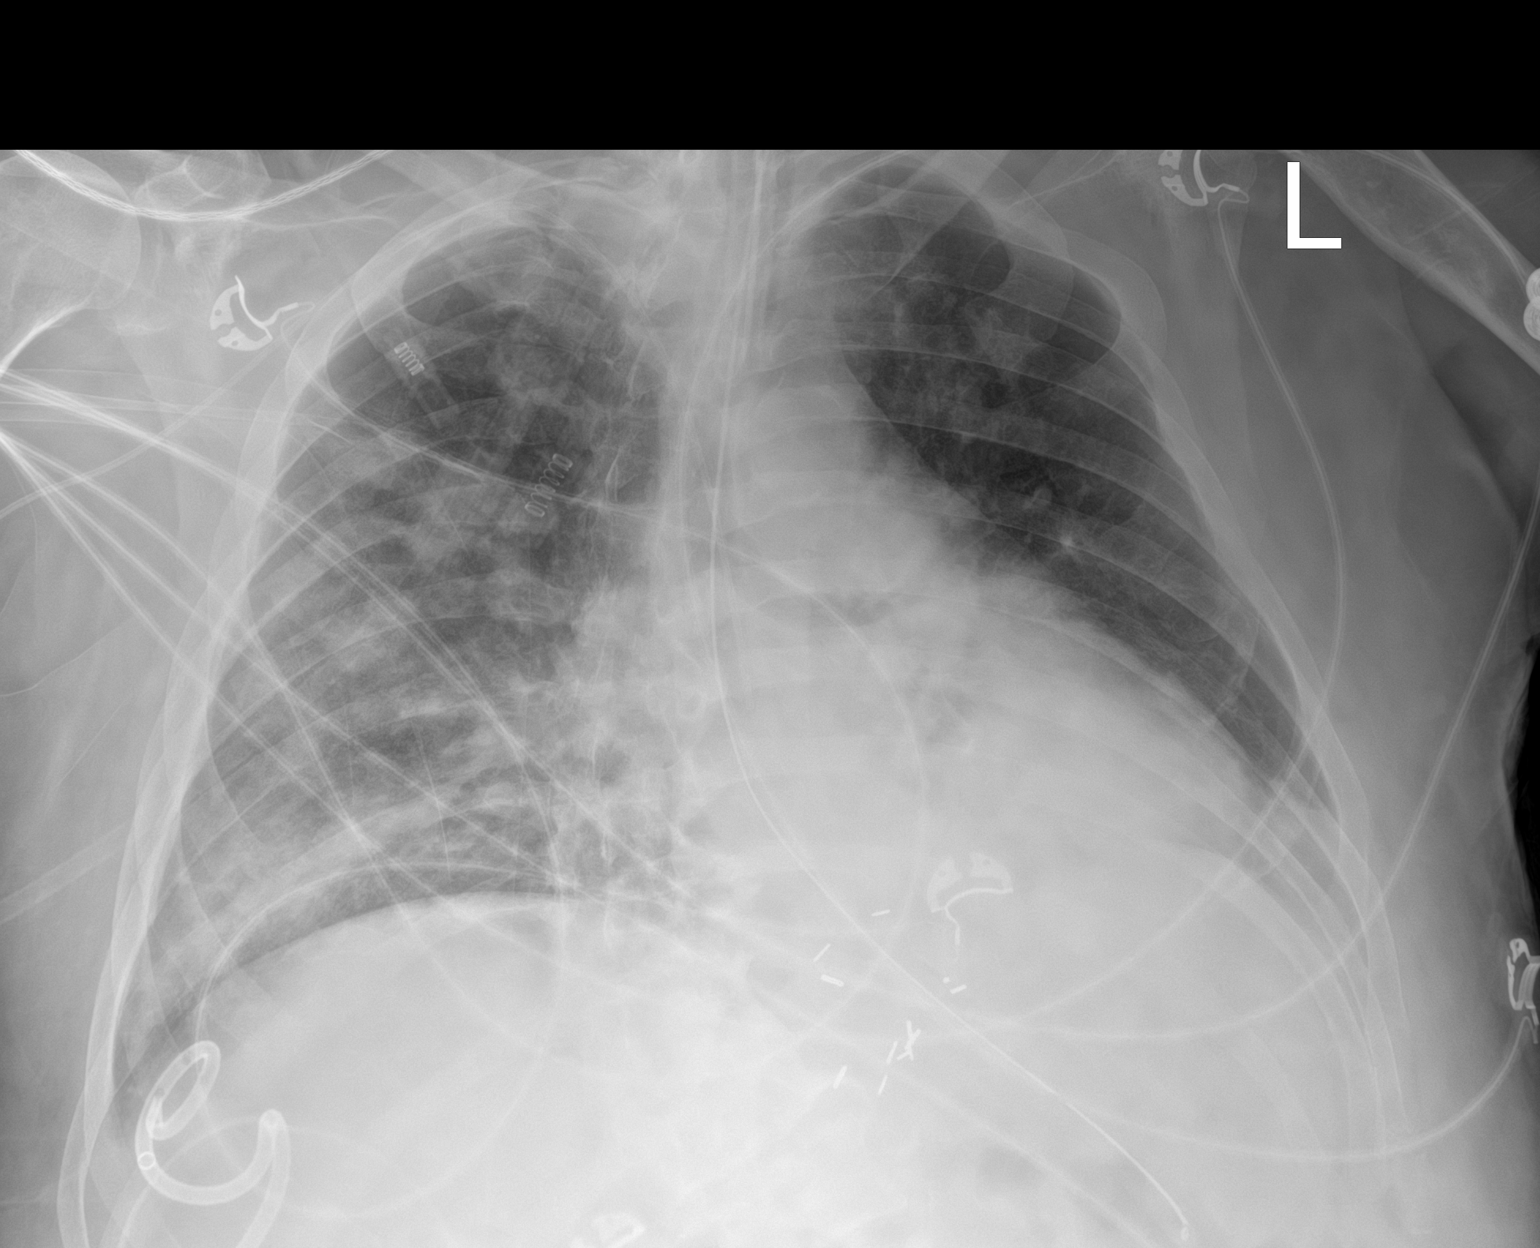

[1 of 1 positions shown; findings below may reference images not displayed]

FINDINGS: 2022 hours. Endotracheal tube tip is 5.1 cm above the base of the
carina. Right PICC line tip overlies the distal SVC level. NG tube
tip is in the stomach. Pigtail catheter overlies the lateral right
upper quadrant. Retrocardiac collapse/consolidation is similar to
minimally improved in the interval. Patchy/nodular airspace disease
in the right lung is more prominent than before.
IMPRESSION: 1. Interval improvement in retrocardiac collapse/consolidation.
2. Interval increase in patchy/nodular airspace disease in the right
lung.
